# Patient Record
Sex: Male | Born: 1965 | Race: White | Hispanic: No | Marital: Single | Smoking: Former smoker
Health system: Southern US, Community
[De-identification: ages and names within clinical notes are randomized; demographics above are authoritative.]

## PROBLEM LIST (undated history)

## (undated) DIAGNOSIS — E118 Type 2 diabetes mellitus with unspecified complications: Secondary | ICD-10-CM

## (undated) DIAGNOSIS — E042 Nontoxic multinodular goiter: Secondary | ICD-10-CM

## (undated) DIAGNOSIS — Z720 Tobacco use: Secondary | ICD-10-CM

## (undated) DIAGNOSIS — I2510 Atherosclerotic heart disease of native coronary artery without angina pectoris: Secondary | ICD-10-CM

## (undated) DIAGNOSIS — S065X9A Traumatic subdural hemorrhage with loss of consciousness of unspecified duration, initial encounter: Secondary | ICD-10-CM

## (undated) DIAGNOSIS — B1910 Unspecified viral hepatitis B without hepatic coma: Secondary | ICD-10-CM

## (undated) DIAGNOSIS — G4730 Sleep apnea, unspecified: Secondary | ICD-10-CM

## (undated) DIAGNOSIS — S065XAA Traumatic subdural hemorrhage with loss of consciousness status unknown, initial encounter: Secondary | ICD-10-CM

## (undated) DIAGNOSIS — N182 Chronic kidney disease, stage 2 (mild): Secondary | ICD-10-CM

## (undated) DIAGNOSIS — B20 Human immunodeficiency virus [HIV] disease: Secondary | ICD-10-CM

## (undated) DIAGNOSIS — N200 Calculus of kidney: Secondary | ICD-10-CM

## (undated) DIAGNOSIS — I219 Acute myocardial infarction, unspecified: Secondary | ICD-10-CM

## (undated) DIAGNOSIS — K5190 Ulcerative colitis, unspecified, without complications: Secondary | ICD-10-CM

## (undated) DIAGNOSIS — I10 Essential (primary) hypertension: Secondary | ICD-10-CM

## (undated) DIAGNOSIS — E785 Hyperlipidemia, unspecified: Secondary | ICD-10-CM

## (undated) DIAGNOSIS — Z21 Asymptomatic human immunodeficiency virus [HIV] infection status: Secondary | ICD-10-CM

## (undated) DIAGNOSIS — C3490 Malignant neoplasm of unspecified part of unspecified bronchus or lung: Secondary | ICD-10-CM

## (undated) DIAGNOSIS — E119 Type 2 diabetes mellitus without complications: Secondary | ICD-10-CM

## (undated) HISTORY — DX: Malignant neoplasm of unspecified part of unspecified bronchus or lung (CMS HCC): C34.90

## (undated) HISTORY — DX: Type 2 diabetes mellitus without complications (CMS HCC): E11.9

## (undated) HISTORY — PX: HX CORONARY ARTERY BYPASS GRAFT: SHX141

## (undated) HISTORY — DX: Essential (primary) hypertension: I10

## (undated) HISTORY — PX: PORTACATH PLACEMENT: SHX2246

## (undated) HISTORY — PX: HX OTHER: 2100001105

## (undated) HISTORY — PX: OTHER SURGICAL HISTORY: SHX169

## (undated) HISTORY — DX: Ulcerative colitis, unspecified, without complications: K51.90

## (undated) HISTORY — PX: TONSILLECTOMY: SUR1361

## (undated) HISTORY — DX: Traumatic subdural hemorrhage with loss of consciousness of unspecified duration, initial encounter: S06.5X9A

## (undated) HISTORY — PX: EXPLORATORY LAPAROTOMY: SUR591

## (undated) HISTORY — PX: LEG SURGERY: SHX1003

## (undated) HISTORY — DX: Tobacco use: Z72.0

## (undated) HISTORY — DX: Nontoxic multinodular goiter: E04.2

## (undated) HISTORY — DX: Atherosclerotic heart disease of native coronary artery without angina pectoris: I25.10

## (undated) HISTORY — DX: Chronic kidney disease, stage 2 (mild): N18.2

## (undated) HISTORY — DX: Traumatic subdural hemorrhage with loss of consciousness status unknown, initial encounter: S06.5XAA

## (undated) HISTORY — DX: Hyperlipidemia, unspecified: E78.5

## (undated) HISTORY — DX: Type 2 diabetes mellitus with unspecified complications: E11.8

---

## 1998-07-04 ENCOUNTER — Emergency Department (HOSPITAL_COMMUNITY): Admission: EM | Admit: 1998-07-04 | Discharge: 1998-07-04 | Payer: Self-pay | Admitting: Emergency Medicine

## 1998-07-04 ENCOUNTER — Encounter: Payer: Self-pay | Admitting: Emergency Medicine

## 2000-04-29 ENCOUNTER — Encounter: Admission: RE | Admit: 2000-04-29 | Discharge: 2000-04-29 | Payer: Self-pay | Admitting: Internal Medicine

## 2000-04-29 ENCOUNTER — Ambulatory Visit (HOSPITAL_COMMUNITY): Admission: RE | Admit: 2000-04-29 | Discharge: 2000-04-29 | Payer: Self-pay | Admitting: Internal Medicine

## 2000-05-20 ENCOUNTER — Encounter: Admission: RE | Admit: 2000-05-20 | Discharge: 2000-05-20 | Payer: Self-pay | Admitting: Internal Medicine

## 2000-08-26 ENCOUNTER — Encounter: Admission: RE | Admit: 2000-08-26 | Discharge: 2000-08-26 | Payer: Self-pay | Admitting: Internal Medicine

## 2000-08-26 ENCOUNTER — Ambulatory Visit (HOSPITAL_COMMUNITY): Admission: RE | Admit: 2000-08-26 | Discharge: 2000-08-26 | Payer: Self-pay | Admitting: Internal Medicine

## 2000-09-09 ENCOUNTER — Encounter: Admission: RE | Admit: 2000-09-09 | Discharge: 2000-09-09 | Payer: Self-pay | Admitting: Internal Medicine

## 2000-11-06 ENCOUNTER — Encounter: Payer: Self-pay | Admitting: Internal Medicine

## 2000-11-06 ENCOUNTER — Encounter: Admission: RE | Admit: 2000-11-06 | Discharge: 2000-11-06 | Payer: Self-pay | Admitting: Internal Medicine

## 2000-11-07 ENCOUNTER — Encounter: Admission: RE | Admit: 2000-11-07 | Discharge: 2000-11-07 | Payer: Self-pay | Admitting: Internal Medicine

## 2000-11-07 ENCOUNTER — Encounter: Payer: Self-pay | Admitting: Internal Medicine

## 2000-12-09 ENCOUNTER — Encounter: Admission: RE | Admit: 2000-12-09 | Discharge: 2000-12-09 | Payer: Self-pay | Admitting: Internal Medicine

## 2000-12-09 ENCOUNTER — Ambulatory Visit (HOSPITAL_COMMUNITY): Admission: RE | Admit: 2000-12-09 | Discharge: 2000-12-09 | Payer: Self-pay | Admitting: Internal Medicine

## 2000-12-30 ENCOUNTER — Encounter: Admission: RE | Admit: 2000-12-30 | Discharge: 2000-12-30 | Payer: Self-pay | Admitting: Internal Medicine

## 2001-04-07 ENCOUNTER — Encounter: Admission: RE | Admit: 2001-04-07 | Discharge: 2001-04-07 | Payer: Self-pay | Admitting: Infectious Diseases

## 2001-04-07 ENCOUNTER — Encounter: Payer: Self-pay | Admitting: Infectious Diseases

## 2001-04-07 ENCOUNTER — Ambulatory Visit (HOSPITAL_COMMUNITY): Admission: RE | Admit: 2001-04-07 | Discharge: 2001-04-07 | Payer: Self-pay | Admitting: Infectious Diseases

## 2001-04-08 ENCOUNTER — Encounter: Admission: RE | Admit: 2001-04-08 | Discharge: 2001-04-08 | Payer: Self-pay | Admitting: Infectious Diseases

## 2001-04-27 ENCOUNTER — Ambulatory Visit (HOSPITAL_COMMUNITY): Admission: RE | Admit: 2001-04-27 | Discharge: 2001-04-27 | Payer: Self-pay | Admitting: Internal Medicine

## 2001-04-27 ENCOUNTER — Encounter: Admission: RE | Admit: 2001-04-27 | Discharge: 2001-04-27 | Payer: Self-pay | Admitting: Internal Medicine

## 2001-05-11 ENCOUNTER — Encounter: Admission: RE | Admit: 2001-05-11 | Discharge: 2001-05-11 | Payer: Self-pay | Admitting: Internal Medicine

## 2001-07-28 ENCOUNTER — Encounter: Admission: RE | Admit: 2001-07-28 | Discharge: 2001-07-28 | Payer: Self-pay

## 2001-07-28 ENCOUNTER — Ambulatory Visit (HOSPITAL_COMMUNITY): Admission: RE | Admit: 2001-07-28 | Discharge: 2001-07-28 | Payer: Self-pay | Admitting: Internal Medicine

## 2001-08-11 ENCOUNTER — Encounter: Admission: RE | Admit: 2001-08-11 | Discharge: 2001-08-11 | Payer: Self-pay | Admitting: Internal Medicine

## 2001-10-26 ENCOUNTER — Encounter: Admission: RE | Admit: 2001-10-26 | Discharge: 2001-10-26 | Payer: Self-pay | Admitting: Internal Medicine

## 2001-10-26 ENCOUNTER — Ambulatory Visit (HOSPITAL_COMMUNITY): Admission: RE | Admit: 2001-10-26 | Discharge: 2001-10-26 | Payer: Self-pay | Admitting: Internal Medicine

## 2001-11-16 ENCOUNTER — Encounter: Admission: RE | Admit: 2001-11-16 | Discharge: 2001-11-16 | Payer: Self-pay | Admitting: Internal Medicine

## 2002-02-11 ENCOUNTER — Encounter: Payer: Self-pay | Admitting: Infectious Diseases

## 2002-02-11 ENCOUNTER — Encounter: Admission: RE | Admit: 2002-02-11 | Discharge: 2002-02-11 | Payer: Self-pay | Admitting: Infectious Diseases

## 2002-02-11 ENCOUNTER — Ambulatory Visit (HOSPITAL_COMMUNITY): Admission: RE | Admit: 2002-02-11 | Discharge: 2002-02-11 | Payer: Self-pay | Admitting: Infectious Diseases

## 2002-02-23 ENCOUNTER — Ambulatory Visit (HOSPITAL_COMMUNITY): Admission: RE | Admit: 2002-02-23 | Discharge: 2002-02-23 | Payer: Self-pay | Admitting: Internal Medicine

## 2002-02-23 ENCOUNTER — Encounter: Admission: RE | Admit: 2002-02-23 | Discharge: 2002-02-23 | Payer: Self-pay | Admitting: Internal Medicine

## 2002-03-16 ENCOUNTER — Encounter: Admission: RE | Admit: 2002-03-16 | Discharge: 2002-03-16 | Payer: Self-pay | Admitting: Internal Medicine

## 2004-07-22 ENCOUNTER — Inpatient Hospital Stay (HOSPITAL_COMMUNITY): Admission: AD | Admit: 2004-07-22 | Discharge: 2004-07-24 | Payer: Self-pay | Admitting: Internal Medicine

## 2006-04-20 ENCOUNTER — Emergency Department (HOSPITAL_COMMUNITY): Admission: EM | Admit: 2006-04-20 | Discharge: 2006-04-20 | Payer: Self-pay | Admitting: Family Medicine

## 2006-04-21 ENCOUNTER — Emergency Department (HOSPITAL_COMMUNITY): Admission: EM | Admit: 2006-04-21 | Discharge: 2006-04-21 | Payer: Self-pay | Admitting: Family Medicine

## 2009-04-22 HISTORY — PX: CORONARY STENT PLACEMENT: SHX1402

## 2009-10-19 ENCOUNTER — Encounter: Admission: RE | Admit: 2009-10-19 | Discharge: 2009-10-19 | Payer: Self-pay | Admitting: Internal Medicine

## 2010-02-11 ENCOUNTER — Emergency Department (HOSPITAL_BASED_OUTPATIENT_CLINIC_OR_DEPARTMENT_OTHER): Admission: EM | Admit: 2010-02-11 | Discharge: 2010-02-11 | Payer: Self-pay | Admitting: Emergency Medicine
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
## 2010-09-07 NOTE — Discharge Summary (Signed)
[name], [name]                   ACCOUNT NO.:  [id number]   MEDICAL RECORD NO.:  [id number]          PATIENT TYPE:  INP   LOCATION:  3023                         FACILITY:  [facility]   PHYSICIAN:  Thora Lance, M.D.  DATE OF BIRTH:  04-03-1966   DATE OF ADMISSION:  07/22/2004  DATE OF DISCHARGE:  07/24/2004                                 DISCHARGE SUMMARY   REASON FOR ADMISSION:  This is a 45 year old white male who was admitted  with fever to 102, diarrhea.  The patient had had two weeks of diarrhea.  Initially he was febrile but then this had resolved.  In the two days prior  to admission he again had fever with abdominal pain and the continued  diarrhea.  He is HIV positive, followed at [facility] by Dr.  __________.   PHYSICAL EXAMINATION:  VITAL SIGNS:  Blood pressure 116/66, temperature  101.4, heart rate 78, respirations 18.  LUNGS:  Clear.  HEART:  Regular rate and rhythm with a 2/6 systolic murmur.  ABDOMEN:  Soft.  Bowel sounds present.  Tender bilateral lower abdomen.   HOSPITAL COURSE:  #1 - DIARRHEA:  The patient was admitted with fever and  diarrhea.  He had stool for culture, O&P, and C. difficile sent.  The CT of  the abdomen showed pan colitis.  C. difficile antigen was negative.  Blood  cultures, stool for O&P, and stool cultures were pending at the time of this  dictation.  The patient was empirically started on Flagyl and Cipro.  By the  second hospital day he had become afebrile and had decrease in his diarrhea.  He was sent home and will follow up with me in three days.  If his stool  studies do not reveal an etiology of his diarrhea and he continues to have  fever and diarrhea, we will arrange a colonoscopy.   #2 - SYSTOLIC MURMUR:  The patient did have a 2/6 systolic murmur noted on  examination.  Blood cultures remained negative.  A 2-D echocardiogram was  done and was pending at the time of this dictation.   DISCHARGE DIAGNOSES:  1.  Acute  colitis.  2.  Systolic murmur.  3.  Human immunodeficiency virus positive.   PROCEDURE:  CT scan of the abdomen and pelvis.   DISCHARGE MEDICATIONS:  1.  Cipro 500 mg p.o. b.i.d. six days.  2.  Flagyl 500 mg t.i.d. six days.   DIET:  As tolerated.   ACTIVITY:  No restrictions.   DISPOSITION:  To home.   FOLLOWUP:  Three days, Dr. Valentina Lucks.      JJG/MEDQ  D:  07/25/2004  T:  07/25/2004  Job:  [id number]

## 2011-02-17 ENCOUNTER — Emergency Department (HOSPITAL_BASED_OUTPATIENT_CLINIC_OR_DEPARTMENT_OTHER)
Admission: EM | Admit: 2011-02-17 | Discharge: 2011-02-17 | Disposition: A | Discharge status: Other Acute Inpatient Hospital | Payer: Managed Care, Other (non HMO) | Source: Home / Self Care | Attending: Emergency Medicine | Admitting: Emergency Medicine

## 2011-02-17 ENCOUNTER — Other Ambulatory Visit: Payer: Self-pay

## 2011-02-17 ENCOUNTER — Encounter: Payer: Self-pay | Admitting: *Deleted

## 2011-02-17 ENCOUNTER — Inpatient Hospital Stay (HOSPITAL_COMMUNITY)
Admission: AD | Admit: 2011-02-17 | Discharge: 2011-02-20 | DRG: 247 | Disposition: A | Discharge status: Home / Self Care | Payer: Managed Care, Other (non HMO) | Source: Other Acute Inpatient Hospital | Attending: Cardiology | Admitting: Cardiology

## 2011-02-17 ENCOUNTER — Emergency Department (INDEPENDENT_AMBULATORY_CARE_PROVIDER_SITE_OTHER): Payer: Managed Care, Other (non HMO)

## 2011-02-17 DIAGNOSIS — Z7982 Long term (current) use of aspirin: Secondary | ICD-10-CM

## 2011-02-17 DIAGNOSIS — I2510 Atherosclerotic heart disease of native coronary artery without angina pectoris: Secondary | ICD-10-CM | POA: Diagnosis present

## 2011-02-17 DIAGNOSIS — I10 Essential (primary) hypertension: Secondary | ICD-10-CM | POA: Insufficient documentation

## 2011-02-17 DIAGNOSIS — R059 Cough, unspecified: Secondary | ICD-10-CM

## 2011-02-17 DIAGNOSIS — R079 Chest pain, unspecified: Secondary | ICD-10-CM

## 2011-02-17 DIAGNOSIS — Z23 Encounter for immunization: Secondary | ICD-10-CM

## 2011-02-17 DIAGNOSIS — I214 Non-ST elevation (NSTEMI) myocardial infarction: Principal | ICD-10-CM | POA: Diagnosis present

## 2011-02-17 DIAGNOSIS — Z79899 Other long term (current) drug therapy: Secondary | ICD-10-CM

## 2011-02-17 DIAGNOSIS — Z21 Asymptomatic human immunodeficiency virus [HIV] infection status: Secondary | ICD-10-CM | POA: Insufficient documentation

## 2011-02-17 DIAGNOSIS — R05 Cough: Secondary | ICD-10-CM

## 2011-02-17 DIAGNOSIS — Z7902 Long term (current) use of antithrombotics/antiplatelets: Secondary | ICD-10-CM

## 2011-02-17 DIAGNOSIS — R0602 Shortness of breath: Secondary | ICD-10-CM | POA: Insufficient documentation

## 2011-02-17 DIAGNOSIS — F17200 Nicotine dependence, unspecified, uncomplicated: Secondary | ICD-10-CM | POA: Insufficient documentation

## 2011-02-17 DIAGNOSIS — Z8619 Personal history of other infectious and parasitic diseases: Secondary | ICD-10-CM

## 2011-02-17 HISTORY — DX: Essential (primary) hypertension: I10

## 2011-02-17 HISTORY — DX: Asymptomatic human immunodeficiency virus (hiv) infection status: Z21

## 2011-02-17 HISTORY — DX: Human immunodeficiency virus (HIV) disease: B20

## 2011-02-17 LAB — CBC
HCT: 45.1 % (ref 39.0–52.0)
HCT: 43.8 % (ref 39.0–52.0)
Hemoglobin: 15.5 g/dL (ref 13.0–17.0)
Hemoglobin: 15.1 g/dL (ref 13.0–17.0)
MCH: 28.4 pg (ref 26.0–34.0)
MCH: 28.9 pg (ref 26.0–34.0)
MCHC: 34.4 g/dL (ref 30.0–36.0)
MCV: 82.6 fL (ref 78.0–100.0)
MCV: 83.9 fL (ref 78.0–100.0)
Platelets: 200 10*3/uL (ref 150–400)
RBC: 5.46 MIL/uL (ref 4.22–5.81)
RDW: 13.8 % (ref 11.5–15.5)
WBC: 9.1 10*3/uL (ref 4.0–10.5)

## 2011-02-17 LAB — CARDIAC PANEL(CRET KIN+CKTOT+MB+TROPI)
CK, MB: 15.6 ng/mL (ref 0.3–4.0)
CK, MB: 41.7 ng/mL (ref 0.3–4.0)
Relative Index: 8 — ABNORMAL HIGH (ref 0.0–2.5)
Relative Index: 6.1 — ABNORMAL HIGH (ref 0.0–2.5)
Troponin I: 9.16 ng/mL (ref <0.30)

## 2011-02-17 LAB — DIFFERENTIAL
Basophils Absolute: 0.1 10*3/uL (ref 0.0–0.1)
Basophils Relative: 1 % (ref 0–1)
Eosinophils Relative: 0 % (ref 0–5)
Monocytes Relative: 7 % (ref 3–12)

## 2011-02-17 LAB — BASIC METABOLIC PANEL
BUN: 11 mg/dL (ref 6–23)
Chloride: 97 mEq/L (ref 96–112)
Creatinine, Ser: 0.7 mg/dL (ref 0.50–1.35)
GFR calc Af Amer: >90 mL/min (ref >90)
GFR calc non Af Amer: >90 mL/min (ref >90)
Glucose, Bld: 219 mg/dL — ABNORMAL HIGH (ref 70–99)

## 2011-02-17 LAB — APTT: aPTT: 37 seconds (ref 24–37)

## 2011-02-17 MED ORDER — ONDANSETRON HCL 4 MG/2ML IJ SOLN
INTRAMUSCULAR | Status: AC
  Administered 2011-02-17: 4 mg via INTRAVENOUS
  Filled 2011-02-17: qty 2

## 2011-02-17 MED ORDER — NITROGLYCERIN 2 % TD OINT
0.5000 [in_us] | TOPICAL_OINTMENT | Freq: Once | TRANSDERMAL | Status: AC
  Administered 2011-02-17: 66.6667 [in_us] via TOPICAL
  Filled 2011-02-17: qty 1

## 2011-02-17 MED ORDER — MORPHINE SULFATE 4 MG/ML IJ SOLN
INTRAMUSCULAR | Status: AC
  Administered 2011-02-17: 4 mg via INTRAVENOUS
  Filled 2011-02-17: qty 1

## 2011-02-17 MED ORDER — ASPIRIN 81 MG PO TABS
324.0000 mg | ORAL_TABLET | Freq: Once | ORAL | Status: AC
  Administered 2011-02-17: 324 mg via ORAL

## 2011-02-17 MED ORDER — HEPARIN (PORCINE) IN NACL 100-0.45 UNIT/ML-% IJ SOLN
1400.0000 [IU]/h | Freq: Once | INTRAMUSCULAR | Status: AC
  Administered 2011-02-17: 1400 [IU]/h via INTRAVENOUS

## 2011-02-17 MED ORDER — ONDANSETRON HCL 4 MG/2ML IJ SOLN
4.0000 mg | Freq: Once | INTRAMUSCULAR | Status: AC
  Administered 2011-02-17: 4 mg via INTRAVENOUS

## 2011-02-17 MED ORDER — METOPROLOL TARTRATE 1 MG/ML IV SOLN
5.0000 mg | Freq: Once | INTRAVENOUS | Status: AC
  Administered 2011-02-17: 5 mg via INTRAVENOUS

## 2011-02-17 MED ORDER — HEPARIN (PORCINE) IN NACL 100-0.45 UNIT/ML-% IJ SOLN
INTRAMUSCULAR | Status: AC
  Filled 2011-02-17: qty 250

## 2011-02-17 MED ORDER — ALBUTEROL SULFATE (5 MG/ML) 0.5% IN NEBU: 2.5000 mg | INHALATION_SOLUTION | Freq: Once | RESPIRATORY_TRACT | Status: DC

## 2011-02-17 MED ORDER — IPRATROPIUM BROMIDE 0.02 % IN SOLN: 0.5000 mg | Freq: Once | RESPIRATORY_TRACT | Status: DC

## 2011-02-17 MED ORDER — METOPROLOL TARTRATE 1 MG/ML IV SOLN
INTRAVENOUS | Status: AC
  Administered 2011-02-17: 5 mg via INTRAVENOUS
  Filled 2011-02-17: qty 5

## 2011-02-17 MED ORDER — MORPHINE SULFATE 4 MG/ML IJ SOLN
4.0000 mg | Freq: Once | INTRAMUSCULAR | Status: AC
  Administered 2011-02-17: 4 mg via INTRAVENOUS

## 2011-02-17 MED ORDER — HEPARIN (PORCINE) IN NACL 100-0.45 UNIT/ML-% IJ SOLN: 18.0000 [IU]/kg/h | Freq: Once | INTRAMUSCULAR | Status: DC

## 2011-02-17 MED ORDER — ASPIRIN 81 MG PO CHEW
CHEWABLE_TABLET | ORAL | Status: AC
  Administered 2011-02-17: 17:00:00
  Filled 2011-02-17: qty 4

## 2011-02-17 MED ORDER — ASPIRIN 325 MG PO TABS: 325.0000 mg | ORAL_TABLET | Freq: Once | ORAL | Status: DC

## 2011-02-17 MED ORDER — HEPARIN BOLUS VIA INFUSION
4000.0000 [IU] | Freq: Once | INTRAVENOUS | Status: AC
  Administered 2011-02-17: 4000 [IU] via INTRAVENOUS
  Filled 2011-02-17: qty 4000

## 2011-02-17 NOTE — ED Provider Notes (Signed)
History    Scribed for Dylan Chick, MD, the patient was seen in room MH01/MH01. This chart was scribed by Katha Cabal.   CSN: [id number] Arrival date & time: 02/17/2011  2:37 PM   First MD Initiated Contact with Patient 02/17/11 1521      Chief Complaint  Patient presents with  . Chest Pain    (Consider location/radiation/quality/duration/timing/severity/associated sxs/prior treatment) HPI [name] is a 45 y.o. male who presents to the Emergency Department complaining of intermittent mid sternal chest pain that radiates to up neck and down bilateral UE.  SOB for past few days.  Pain is described as dull and pressured.  Patient reorts sudden onset of chest pain yesterday while he sitting on the couch.  Chest pain is not worsened by exertion.  Patient reports chest pain is worsened by coughing.  Patient was evaluated by Dr. Valentina Lucks for ear infection.  Patient just finished taking prednisone without relief.  Patient has persistent cough and bilateral ear pain but denies current nasal congestion.  Patient is a smoker with hx of HTN.  Patient does not have hx of DM, heart problems and hypercholesterolemia.  Patient with hx of HIV and his last viral load was undectable in July.   PCP Dr. Kirby Funk     Past Medical History  Diagnosis Date  . Hypertension   . HIV (human immunodeficiency virus infection)     Past Surgical History  Procedure Date  . Tonsillectomy   . Leg surgery   . Arm surgery     No family history on file.  History  Substance Use Topics  . Smoking status: Current Everyday Smoker  . Smokeless tobacco: Not on file  . Alcohol Use: Yes      Review of Systems 10 Systems reviewed and are negative for acute change except as noted in the HPI.  Allergies  Review of patient's allergies indicates no known allergies.  Home Medications   Current Outpatient Rx  Name Route Sig Dispense Refill  . DARUNAVIR ETHANOLATE 400 MG PO TABS Oral Take 400 mg by  mouth daily with breakfast.      . EMTRICITABINE-TENOFOVIR 200-300 MG PO TABS Oral Take 1 tablet by mouth daily.      Marland Kitchen HYDROCHLOROTHIAZIDE 25 MG PO TABS Oral Take 25 mg by mouth daily.      . IBUPROFEN 200 MG PO CAPS Oral Take 3 capsules by mouth every 6 (six) hours as needed. For migraine     . RITONAVIR 100 MG PO CAPS Oral Take 100 mg by mouth daily.        BP 172/116  Pulse 97  Temp(Src) 97.6 F (36.4 C) (Oral)  Resp 17  SpO2 100%  Physical Exam  Constitutional: He is oriented to person, place, and time. He appears well-developed and well-nourished.  HENT:  Head: Normocephalic and atraumatic.  Right Ear: Tympanic membrane is not erythematous.  Left Ear: Tympanic membrane is not erythematous.       fluid behind TMs bilaterally  Eyes: EOM are normal. Pupils are equal, round, and reactive to light.  Cardiovascular: Normal rate, regular rhythm and normal heart sounds.   Pulmonary/Chest: Effort normal. No respiratory distress. He has no wheezes. He exhibits tenderness (low mid sternal area ).  Abdominal: Soft. There is no tenderness. There is no rebound.  Musculoskeletal: Normal range of motion. He exhibits no edema.  Neurological: He is alert and oriented to person, place, and time.  Skin: Skin is warm, dry and  intact.  Psychiatric: He has a normal mood and affect. His behavior is normal.    ED Course  CRITICAL CARE Performed by: [name] Authorized by: [name] Total critical care time: 40 minutes Critical care time was exclusive of separately billable procedures and treating other patients. Critical care was necessary to treat or prevent imminent or life-threatening deterioration of the following conditions: cardiac failure. Critical care was time spent personally by me on the following activities: development of treatment plan with patient or surrogate, discussions with consultants, evaluation of patient's response to treatment, examination of patient,  obtaining history from patient or surrogate, ordering and performing treatments and interventions, ordering and review of laboratory studies, ordering and review of radiographic studies, pulse oximetry and re-evaluation of patient's condition.   (including critical care time)   DIAGNOSTIC STUDIES: Oxygen Saturation is 100% on nasal cannula,  normal by my interpretation.  EKG:   Date: 02/17/2011  Rate: 98  Rhythm: normal sinus rhythm  QRS Axis: normal  Intervals: normal  ST/T Wave abnormalities: nonspecific T wave changes diffuse t wave flattening and isolated twi in lead III  Conduction Disutrbances:none  Narrative Interpretation:   Old EKG Reviewed: none available   COORDINATION OF CARE:  3:56 PM  Physical exam complete.  Will order labs and CXR.   5:06 PM  Spoke with cardiologist regarding patients elevated troponin.        LABS / RADIOLOGY:  Labs Reviewed  BASIC METABOLIC PANEL - Abnormal; Notable for the following:    Sodium 133 (*)    Glucose, Bld 219 (*)    All other components within normal limits  CARDIAC PANEL(CRET KIN+CKTOT+MB+TROPI) - Abnormal; Notable for the following:    CK, MB 15.6 (*)    Troponin I 1.24 (*)    Relative Index 8.0 (*)    All other components within normal limits  CBC   No results found.  Results for orders placed during the hospital encounter of 02/17/11  CBC      Component Value Range   WBC 9.1  4.0 - 10.5 (K/uL)   RBC 5.46  4.22 - 5.81 (MIL/uL)   Hemoglobin 15.5  13.0 - 17.0 (g/dL)   HCT 60.4  54.0 - 98.1 (%)   MCV 82.6  78.0 - 100.0 (fL)   MCH 28.4  26.0 - 34.0 (pg)   MCHC 34.4  30.0 - 36.0 (g/dL)   RDW 19.1  47.8 - 29.5 (%)   Platelets 200  150 - 400 (K/uL)  BASIC METABOLIC PANEL      Component Value Range   Sodium 133 (*) 135 - 145 (mEq/L)   Potassium 3.9  3.5 - 5.1 (mEq/L)   Chloride 97  96 - 112 (mEq/L)   CO2 25  19 - 32 (mEq/L)   Glucose, Bld 219 (*) 70 - 99 (mg/dL)   BUN 11  6 - 23 (mg/dL)   Creatinine, Ser 6.21   0.50 - 1.35 (mg/dL)   Calcium 9.6  8.4 - 30.8 (mg/dL)   GFR calc non Af Amer >90  >90 (mL/min)   GFR calc Af Amer >90  >90 (mL/min)  CARDIAC PANEL(CRET KIN+CKTOT+MB+TROPI)      Component Value Range   Total CK 196  7 - 232 (U/L)   CK, MB 15.6 (*) 0.3 - 4.0 (ng/mL)   Troponin I 1.24 (*) <0.30 (ng/mL)   Relative Index 8.0 (*) 0.0 - 2.5         MDM   MDM:  pt presenting with chest tightness with radiation into bilateral arms- pain began last night while seated.  Not made worse with exertion.  Described as tightness in the center of his chest.  Pt has had recent viral illness- nasal congestion and cough, chest discomfort worsened with coughing.  EKG with nonspecific t wave flattening, no st segment changes.  Labs obtained and revealed elevated troponin and CK-MB.  D/w Dr. Donnie Aho, pt to be transferred to step down bed at [facility].  Heparin, nitroglycerin started.  Aspirin given.  Pt aware of results and plan for transfer.     MEDICATIONS GIVEN IN THE E.D. Scheduled Meds:    . aspirin      . aspirin  324 mg Oral Once  . heparin  4,000 Units Intravenous Once  . heparin  1,400 Units/hr Intravenous Once  . nitroGLYCERIN  0.5 inch Topical Once  . DISCONTD: albuterol  2.5 mg Nebulization Once  . DISCONTD: aspirin  325 mg Oral Once  . DISCONTD: heparin  18 Units/kg/hr Intravenous Once  . DISCONTD: ipratropium  0.5 mg Nebulization Once   Continuous Infusions:      IMPRESSION: No diagnosis found.     Medical screening examination/treatment/procedure(s) were performed by non-physician practitioner and as supervising physician I was immediately available for consultation/collaboration.           Dylan Chick, MD 02/18/11 [phone]

## 2011-02-17 NOTE — ED Notes (Signed)
Patient states that last night he started experiencing pain in both arms and some mid chest pain that went away, this morning started experiencing the same again with pain shooting up his neck, no CP at this time, but still experiencing bilateral arm pain, has been coughing over the past few and just finished steroids for ear canal inflammation and sinus congestion

## 2011-02-17 NOTE — ED Notes (Signed)
Went to introduce myself to pt.  Pt states he is having some chest tightness and arm pain.  Pt relates he has had his blood drawn but no medications as of yet.  Spoke to Dr. Karma Ganja and informed of tightness.

## 2011-02-18 DIAGNOSIS — I2510 Atherosclerotic heart disease of native coronary artery without angina pectoris: Secondary | ICD-10-CM

## 2011-02-18 LAB — COMPREHENSIVE METABOLIC PANEL
ALT: 57 U/L — ABNORMAL HIGH (ref 0–53)
Alkaline Phosphatase: 100 U/L (ref 39–117)
BUN: 13 mg/dL (ref 6–23)
CO2: 24 mEq/L (ref 19–32)
Chloride: 99 mEq/L (ref 96–112)
GFR calc Af Amer: >90 mL/min (ref >90)
GFR calc non Af Amer: >90 mL/min (ref >90)
Glucose, Bld: 121 mg/dL — ABNORMAL HIGH (ref 70–99)
Potassium: 4 mEq/L (ref 3.5–5.1)
Total Bilirubin: 0.3 mg/dL (ref 0.3–1.2)
Total Protein: 6.9 g/dL (ref 6.0–8.3)

## 2011-02-18 LAB — HEMOGLOBIN A1C
Hgb A1c MFr Bld: 6.5 % — ABNORMAL HIGH (ref <5.7)
Mean Plasma Glucose: 140 mg/dL — ABNORMAL HIGH (ref <117)

## 2011-02-18 LAB — MAGNESIUM: Magnesium: 1.8 mg/dL (ref 1.5–2.5)

## 2011-02-18 LAB — CBC
MCH: 28.2 pg (ref 26.0–34.0)
Platelets: 190 10*3/uL (ref 150–400)
RBC: 4.97 MIL/uL (ref 4.22–5.81)
RDW: 14 % (ref 11.5–15.5)

## 2011-02-18 LAB — LIPID PANEL
Cholesterol: 164 mg/dL (ref 0–200)
Total CHOL/HDL Ratio: 6.3 RATIO

## 2011-02-18 LAB — POCT ACTIVATED CLOTTING TIME: Activated Clotting Time: 457 seconds
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
## 2011-02-19 LAB — CBC
HCT: 39 % (ref 39.0–52.0)
Hemoglobin: 13.2 g/dL (ref 13.0–17.0)
MCV: 85.5 fL (ref 78.0–100.0)
Platelets: 167 10*3/uL (ref 150–400)
RBC: 4.56 MIL/uL (ref 4.22–5.81)
WBC: 9.5 10*3/uL (ref 4.0–10.5)

## 2011-02-19 LAB — BASIC METABOLIC PANEL
BUN: 11 mg/dL (ref 6–23)
CO2: 23 mEq/L (ref 19–32)
Chloride: 102 mEq/L (ref 96–112)
Creatinine, Ser: 0.83 mg/dL (ref 0.50–1.35)

## 2011-02-19 LAB — PLATELET INHIBITION P2Y12: Platelet Function  P2Y12: 269 [PRU] (ref 194–418)

## 2011-02-20 DIAGNOSIS — I214 Non-ST elevation (NSTEMI) myocardial infarction: Secondary | ICD-10-CM

## 2011-02-20 LAB — PLATELET INHIBITION P2Y12: Platelet Function  P2Y12: 36 [PRU] — ABNORMAL LOW (ref 194–418)

## 2011-02-23 NOTE — Cardiovascular Report (Signed)
NAMELUCIFER, [name]                   ACCOUNT NO.:  [id number]  MEDICAL RECORD NO.:  [id number]  LOCATION:                                 FACILITY:  PHYSICIAN:  Arturo Morton. Riley Kill, MD, FACCDATE OF BIRTH:  December 07, 1965  DATE OF PROCEDURE:  02/18/2011 DATE OF DISCHARGE:                           CARDIAC CATHETERIZATION   INDICATIONS:  [name] is a gentleman with multiple medical problems as outlined.  He is on anti-retroviral drugs.  He smokes about a half a pack of cigarettes a day.  He presented with positive cardiac enzymes. He was brought to the cath lab for further evaluation.  The patient was desirous of the radial approach.  PROCEDURES: 1. Left heart catheterization. 2. Selective coronary arteriography. 3. Selective left ventriculography. 4. Percutaneous stenting of the circumflex using a drug-eluting stent.  DESCRIPTION OF PROCEDURE:  The patient was brought to the cath lab, prepped and draped in the usual fashion.  After informed consent, the Allen's test was normal.  Through an anterior puncture, the right radial artery was entered.  A 5-French sheath was placed.  We were unable to pass a J-wire across the shoulder.  A right coronary catheter was placed up near the shoulder, and we were ultimately able to get down using a Versacore wire.  Heparin was given according to protocol.  Intra- arterial verapamil 3 mg had also been given as well as some nitroglycerin.  Views of the right coronary artery and left coronary artery were then obtained.  Central aortic and left ventricular pressures were measured with a pigtail.  Ventriculography was performed in the RAO projection.  We reviewed the films carefully and I discussed the case with the patient.  We elected to proceed with percutaneous coronary stenting of the circumflex artery.  The 5-French sheath was exchanged for a 6-French sheath.  We again had to use a right coronary catheter and had little bit of difficulty getting  around the right shoulder.  We were able then to successfully engage the central aorta. A 3-0 guiding catheter was then utilized to 6-French.  Bivalirudin had been given according to protocol and clopidogrel 600 mg as a bolus had been given as well.  Following this, we used a stabilization wire in the ramus vessel using a Prowater, and then took a traverse wire down into the circumflex.  Predilatation was done with a 2.25-mm, 12-[employer] noncompliant balloon.  This was followed by a PROMUS element drug-eluting stent 20 x 2.5.  This was carefully placed across the lesion and then dilated approximately 12-13 atmospheres.  We then post dilated using a 2.75 noncompliant balloon throughout the course of the stent.  The edges were difficult to see because of the patient's size. We did move the camera quite a bit, to try to minimize radiation dose. Final angiographic views were excellent.  Intracoronary nitroglycerin was administered.  All catheters were then subsequently removed.  A TR band was placed and he was taken to the holding area in satisfactory clinical condition.  HEMODYNAMIC DATA: 1. The central aortic pressure of 107/74, mean 89. 2. LV pressure 106/12 with an LVEDP of 21. 3. There was no gradient  or pullback across the aortic valve.  ANGIOGRAPHIC DATA: 1. The left main coronary artery is free of critical disease. 2. The LAD courses to the apex.  The LAD has multiple areas of plaque     including a 40 and then 30 and then 40% lesion distally.  There is     a second diagonal branch which bifurcates and has a fair amount of     diffuse plaque, but is very small in caliber.  It is up to 80%     narrow in its midportion. 3. The circumflex has a tiny first marginal that is severely diseased,     but it is very small in caliber.  The AV portion at this point has     some mild plaque and then a 95% subtotal occlusion leading into a     large bifurcating marginal.  Following  placement of the drug-     eluting stent, the reduction in stenosis was from 95-0%.  There is     TIMI-2 flow at the beginning of the procedure and TIMI-3 at the     completion of the procedure.  There was no evidence of significant     edge tear.  Intracoronary nitroglycerin did bring out the fair     amount of diffuse plaque within the vessel itself. 4. The right coronary artery has an eccentric shelf of approximately     60% and then a 70-80% stenosis distally with some mild luminal     irregularity. 5. Ventriculography in the RAO projection reveals vigorous global     systolic function with an EF in excess of 55%.  CONCLUSIONS: 1. Well-preserved left ventricular function without a segmental wall     motion abnormality. 2. Non-ST elevation myocardial infarction with subtotal occlusion of     the circumflex coronary artery with TIMI-2 flow with faint distal     collaterals. 3. Diffuse disease of the LAD and moderately severe disease of the     right coronary artery. 4. Successful percutaneous stenting with a drug-eluting platform in     the circumflex artery.  DISPOSITION:  The patient will need aspirin and Plavix for minimum of 12months.  We will do ADP testing.  We will make a decision with regard to the right coronary artery after review of his films with Dr. Antoine Poche.     Arturo Morton. Riley Kill, MD, [facility]     TDS/MEDQ  D:  02/18/2011  T:  02/18/2011  Job:  [id number]  Electronically Signed by Shawnie Pons MD [facility] on 02/23/2011 09:14:37 AM

## 2011-02-27 ENCOUNTER — Encounter: Payer: Managed Care, Other (non HMO) | Admitting: Physician Assistant

## 2011-03-01 ENCOUNTER — Encounter: Payer: Self-pay | Admitting: *Deleted

## 2011-03-04 ENCOUNTER — Encounter: Payer: Self-pay | Admitting: *Deleted

## 2011-03-04 ENCOUNTER — Encounter: Payer: Self-pay | Admitting: Physician Assistant

## 2011-03-04 ENCOUNTER — Ambulatory Visit (INDEPENDENT_AMBULATORY_CARE_PROVIDER_SITE_OTHER): Payer: Managed Care, Other (non HMO) | Admitting: Physician Assistant

## 2011-03-04 VITALS — BP 134/72 | HR 96 | Ht 68.0 in | Wt 273.0 lb

## 2011-03-04 DIAGNOSIS — E785 Hyperlipidemia, unspecified: Secondary | ICD-10-CM

## 2011-03-04 DIAGNOSIS — Z9861 Coronary angioplasty status: Secondary | ICD-10-CM | POA: Insufficient documentation

## 2011-03-04 DIAGNOSIS — Z21 Asymptomatic human immunodeficiency virus [HIV] infection status: Secondary | ICD-10-CM

## 2011-03-04 DIAGNOSIS — I10 Essential (primary) hypertension: Secondary | ICD-10-CM

## 2011-03-04 DIAGNOSIS — B20 Human immunodeficiency virus [HIV] disease: Secondary | ICD-10-CM

## 2011-03-04 DIAGNOSIS — R7302 Impaired glucose tolerance (oral): Secondary | ICD-10-CM | POA: Insufficient documentation

## 2011-03-04 DIAGNOSIS — I2510 Atherosclerotic heart disease of native coronary artery without angina pectoris: Secondary | ICD-10-CM

## 2011-03-04 MED ORDER — METOPROLOL TARTRATE 25 MG PO TABS: 50.0000 mg | ORAL_TABLET | Freq: Two times a day (BID) | ORAL | Status: DC

## 2011-03-04 NOTE — Assessment & Plan Note (Signed)
Controlled.  However, his HR needs to be lower.  I have increased his metoprolol to 50 mg BID.

## 2011-03-04 NOTE — Assessment & Plan Note (Signed)
Doing well post PCI for NSTEMI.  Residual CAD reviewed with Dr.  Shawnie Pons.  He reviewed the patient's films.  Recommendation is to proceed with ETT-myoview to assess for inferior ischemia.  If positive, PCI of RCA should be considered.  If negative, medical Tx can be continued.  I discussed this all with the patient.  He may return to work in one week.  He will follow up with Dr. Riley Kill in 2 weeks.  Continue ASA and Plavix.

## 2011-03-04 NOTE — Assessment & Plan Note (Signed)
Management per ID. 

## 2011-03-04 NOTE — Assessment & Plan Note (Signed)
LFTs were high in the hospital.  Repeat LFTs today.  Schedule follow up FLP and LFTs in 6 weeks.  Goal LDL < 70.

## 2011-03-04 NOTE — Assessment & Plan Note (Signed)
Discussed the importance of follow up on this with the patient.  He will discuss further with his PCP.

## 2011-03-04 NOTE — Patient Instructions (Addendum)
Your physician recommends that you have lab work today; Hepatic function panel; 272.4  Your physician has recommended you make the following change in your medication: Increase Metoprolol to 50 mg twice daily.  This will be two tablets of 25 mg two times daily. This prescription has been sent to your pharmacy.    Your physician has requested that you have en exercise stress myoview DX; 414.01 CAD.  This can be done within the next 2 weeks.   For further information please visit [email or web address] Please follow instruction sheet, as given.  Your physician recommends that you return for lab work in: 6 weeks: Fasting lipid panel, Hepatic function panel 272.4 hyperlipidemia.  Your physician states you may return to work 03/11/2011.  This letter has been provided for you.  Please schedule a follow up appointment with Dr. Riley Kill in 2 weeks.

## 2011-03-04 NOTE — Progress Notes (Signed)
History of Present Illness: PCP:  Dr. Kirby Funk Primary Cardiologist:  Dr.  Shawnie Pons   ID:  Dr. Alvis Lemmings at [facility] [name] is Dylan 45 y.o. male with Dylan h/o HIV, HTN who was admitted 10/28-10/31.  He presented with chest pain.  He ruled in for NSTEMI and had LHC 02/18/11:  LAD 40%, 30%, dLAD 40%, mDx 80% (very small vessel), CFX 95%, tiny OM 90% (too small for PCI), RCA 60%, dRCA 70-80%, EF 55%.  He was tx with Dylan Promus DES to the CFX.  PRU was 36 on Plavix and this was continued.  Residual CAD was treated medically.  Labs: Hgb 13.2, Na 134, K 3.7, creatinine 0.83, AST 93, ALT 57, A1C 6.5, TC 164, TG 280, HDL 26, LDL 82, TSH 0.868, CXR was unremarkable.    Since d/c from the hospital, he has done well.  Had chest pain on 2 occasions.  They were brief and unlike his angina.  Has been walking 20-25 mins without chest pain or dyspnea.  No associated symptoms.  No orthopnea, PND or edema.  No palpitations.  No syncope or near syncope.  We discussed cardiac rehab.  He is not interested due to his work schedule.    Past Medical History  Diagnosis Date  . Hypertension   . HIV (human immunodeficiency virus infection)     on therapy  . Coronary artery disease     s/p NSTEMI 01/2011: LHC 02/18/11:  LAD 40%, 30%, dLAD 40%, mDx 80% (very small vessel), CFX 95%, tiny OM 90% (too small for PCI), RCA 60%, dRCA 70-80%, EF 55%.  He was tx with Dylan Promus DES to the CFX.  . Tobacco abuse   . History of hepatitis B   . History of ulcerative colitis   . Asthma   . HLD (hyperlipidemia)   . Glucose intolerance (impaired glucose tolerance)     A1C 6.5 01/2011    Current Outpatient Prescriptions  Medication Sig Dispense Refill  . albuterol (PROVENTIL HFA;VENTOLIN HFA) 108 (90 BASE) MCG/ACT inhaler Inhale 2 puffs into the lungs every 6 (six) hours as needed.        Marland Kitchen aspirin EC 81 MG tablet Take 81 mg by mouth daily.        . clopidogrel (PLAVIX) 75 MG tablet Take 75 mg by mouth daily.        . darunavir  (PREZISTA) 400 MG tablet Take 800 mg by mouth daily with breakfast.       . emtricitabine-tenofovir (TRUVADA) 200-300 MG per tablet Take 1 tablet by mouth daily.        . fluticasone (FLONASE) 50 MCG/ACT nasal spray Place 2 sprays into the nose as needed.        Marland Kitchen ibuprofen (ADVIL,MOTRIN) 200 MG tablet Take 200 mg by mouth every 6 (six) hours as needed.        . metoprolol tartrate (LOPRESSOR) 25 MG tablet Take 2 tablets (50 mg total) by mouth 2 (two) times daily.  120 tablet  1  . NITROSTAT 0.4 MG SL tablet       . pravastatin (PRAVACHOL) 40 MG tablet Take 40 mg by mouth at bedtime.        . ritonavir (NORVIR) 100 MG capsule Take 100 mg by mouth daily.          Allergies: No Known Allergies  History  Substance Use Topics  . Smoking status: Current Everyday Smoker  . Smokeless tobacco: Not on file  .  Alcohol Use: Yes     ROS:  Please see the history of present illness.   All other systems reviewed and negative.   Vital Signs: BP 134/72  Pulse 96  Ht 5\' 8"  (1.727 m)  Wt 273 lb (123.832 kg)  BMI 41.51 kg/m2  PHYSICAL EXAM: Well nourished, well developed, in no acute distress HEENT: normal Neck: no JVD Cardiac:  normal S1, S2; RRR; no murmur Lungs:  clear to auscultation bilaterally, no wheezing, rhonchi or rales Abd: soft, nontender, no hepatomegaly Ext: no edema; right radial site ok without hematoma or bruit Skin: warm and dry Neuro:  CNs 2-12 intact, no focal abnormalities noted  EKG:  NSR, HR 96, incomplete RBBB, NSSTTW changes  ASSESSMENT AND PLAN:

## 2011-03-06 ENCOUNTER — Telehealth: Payer: Self-pay | Admitting: Cardiology

## 2011-03-06 LAB — HEPATIC FUNCTION PANEL
ALT: 42 U/L (ref 0–53)
AST: 30 U/L (ref 0–37)
Alkaline Phosphatase: 99 U/L (ref 39–117)
Bilirubin, Direct: 0.1 mg/dL (ref 0.0–0.3)
Total Protein: 7.9 g/dL (ref 6.0–8.3)

## 2011-03-06 NOTE — Telephone Encounter (Signed)
Since the patient just saw Lorin Picket, I checked with Danielle Rankin to see if she had this form on the patient. She does have this. She will check with Lorin Picket to see if he can complete this today.

## 2011-03-06 NOTE — Telephone Encounter (Signed)
Claim #[id number], checking on status of fax they sent requesting attending phys statement and most recent office notes, they need it today or his claim will be denied

## 2011-03-06 NOTE — Telephone Encounter (Signed)
I do not see in any of [name]'s paperwork where this form is pending. I have called [employer] and asked that they fax another form. I made them aware that Dr. Riley Kill is out all week and that his nurse is not here today, so I am not certain if the initial form was ever received. I will forward this message to [name] for review.

## 2011-03-06 NOTE — Telephone Encounter (Signed)
Scott signed forms. Forms with records faxed to [facility]. I will forward to Okey Regal to see if she can notify patient tomorrow.

## 2011-03-07 NOTE — Telephone Encounter (Signed)
pt aware forms are done and faxed and will come by to pick up forms today. Danielle Rankin

## 2011-03-20 ENCOUNTER — Ambulatory Visit (HOSPITAL_COMMUNITY): Payer: Managed Care, Other (non HMO) | Attending: Internal Medicine | Admitting: Radiology

## 2011-03-20 VITALS — Ht 68.0 in | Wt 274.0 lb

## 2011-03-20 DIAGNOSIS — I252 Old myocardial infarction: Secondary | ICD-10-CM | POA: Insufficient documentation

## 2011-03-20 DIAGNOSIS — E785 Hyperlipidemia, unspecified: Secondary | ICD-10-CM | POA: Insufficient documentation

## 2011-03-20 DIAGNOSIS — I2510 Atherosclerotic heart disease of native coronary artery without angina pectoris: Secondary | ICD-10-CM | POA: Insufficient documentation

## 2011-03-20 DIAGNOSIS — R079 Chest pain, unspecified: Secondary | ICD-10-CM

## 2011-03-20 DIAGNOSIS — R0989 Other specified symptoms and signs involving the circulatory and respiratory systems: Secondary | ICD-10-CM | POA: Insufficient documentation

## 2011-03-20 DIAGNOSIS — R0602 Shortness of breath: Secondary | ICD-10-CM | POA: Insufficient documentation

## 2011-03-20 DIAGNOSIS — R0609 Other forms of dyspnea: Secondary | ICD-10-CM | POA: Insufficient documentation

## 2011-03-20 DIAGNOSIS — F17200 Nicotine dependence, unspecified, uncomplicated: Secondary | ICD-10-CM | POA: Insufficient documentation

## 2011-03-20 DIAGNOSIS — I10 Essential (primary) hypertension: Secondary | ICD-10-CM | POA: Insufficient documentation

## 2011-03-20 MED ORDER — TECHNETIUM TC 99M TETROFOSMIN IV KIT
11.0000 | PACK | Freq: Once | INTRAVENOUS | Status: AC | PRN
  Administered 2011-03-20: 11 via INTRAVENOUS

## 2011-03-20 MED ORDER — TECHNETIUM TC 99M TETROFOSMIN IV KIT
33.0000 | PACK | Freq: Once | INTRAVENOUS | Status: AC | PRN
  Administered 2011-03-20: 33 via INTRAVENOUS

## 2011-03-20 NOTE — Progress Notes (Signed)
[facility] SITE 3 NUCLEAR MED [address] [place] [address] [phone]  Cardiology Nuclear Med Study  [name] is a 45 y.o. male [id number] 1965/07/13   Nuclear Med Background Indication for Stress Test:  Evaluation for Ischemia, Stent Patency, Assess for inferior ischemia (recent Cath RCA 60%, 70-8% stenosis) History: 02/18/11  Heart Catheterization: EF 55% CFX 95% -stent 90% OM too small for PCI Residual CAD 3V Dz, 02/17/11 Myocardial Infarction: NSTEMI and 02/18/11 Stents: CFX  Cardiac Risk Factors: Hypertension, Lipids and Smoker  Symptoms:  Chest Pain, DOE and SOB   Nuclear Pre-Procedure Caffeine/Decaff Intake:  None NPO After: 10:00pm   Lungs: clear IV 0.9% NS with Angio Cath:  20g  IV Site: R Antecubital x 1, tolerated well IV Started by:  Irean Hong, RN  Chest Size (in):  46 Cup Size: n/a  Height: 5\' 8"  (1.727 m)  Weight:  274 lb (124.286 kg)  BMI:  Body mass index is 41.66 kg/(m^2). Tech Comments:  Last dose metoprolol 2:00pm yesterday    Nuclear Med Study 1 or 2 day study: 1 day  Stress Test Type:  Stress  Reading MD: Arvilla Meres, MD  Order Authorizing Provider:  Shawnie Pons, MD, Tereso Newcomer, [facility]  Resting Radionuclide: Technetium 15m Tetrofosmin  Resting Radionuclide Dose: 11 mCi   Stress Radionuclide:  Technetium 18m Tetrofosmin  Stress Radionuclide Dose: 33 mCi           Stress Protocol Rest HR: 89 Stress HR: 151  Rest BP: 130/70 Stress BP: 235/80  Exercise Time (min): 5:15 METS: 7.0   Predicted Max HR: 173 bpm % Max HR: 87.28 bpm Rate Pressure Product: 21308   Dose of Adenosine (mg):  n/a Dose of Lexiscan: n/a mg  Dose of Atropine (mg): n/a Dose of Dobutamine: n/a mcg/kg/min (at max HR)  Stress Test Technologist: Milana Na, EMT-P  Nuclear Technologist:  Domenic Polite, CNMT     Rest Procedure:  Myocardial perfusion imaging was performed at rest 45 minutes following the intravenous administration of  Technetium 65m Tetrofosmin. Rest ECG: NSR with non-specific ST-T wave changes  Stress Procedure:  The patient exercised for 5:15.  The patient stopped due to fatigue, sob, and denied any chest pain.  There were no significant ST-T wave changes and a rare pvc.  Technetium 102m Tetrofosmin was injected at peak exercise and myocardial perfusion imaging was performed after a brief delay. Stress ECG: No significant change from baseline ECG  QPS Raw Data Images:  Normal; no motion artifact; normal heart/lung ratio. Stress Images:  Decreased uptake in the lateral wall Rest Images:  Decreased uptake in the lateral wall Subtraction (SDS):  Lateral wall infarct with very mild peri-infarct ischemia Transient Ischemic Dilatation (Normal <1.22):  .86 Lung/Heart Ratio (Normal <0.45):  .35  Quantitative Gated Spect Images QGS EDV:  94 ml QGS ESV:  34 ml QGS cine images:  Mild lateral wall HK QGS EF: 63%  Impression Exercise Capacity:  Fair exercise capacity. BP Response:  Hypertensive blood pressure response. Clinical Symptoms:  There is dyspnea. ECG Impression:  No significant ST segment change suggestive of ischemia. Comparison with Prior Nuclear Study: No images to compare  Overall Impression:  Low risk stress nuclear study. Small lateral wall infarct with very mild peri-infarct ischemia   Arvilla Meres, MD 4:00 PM

## 2011-03-22 ENCOUNTER — Telehealth: Payer: Self-pay | Admitting: Cardiology

## 2011-03-22 NOTE — Telephone Encounter (Signed)
Spoke with pt, aware of stress test results. Follow up appt made Deliah Goody

## 2011-03-22 NOTE — Telephone Encounter (Signed)
New Msg: Pt calling wanting to know results of pt Stress Test. Please return pt call to discuss further.

## 2011-03-27 ENCOUNTER — Ambulatory Visit (INDEPENDENT_AMBULATORY_CARE_PROVIDER_SITE_OTHER): Payer: Managed Care, Other (non HMO) | Admitting: Cardiology

## 2011-03-27 ENCOUNTER — Encounter: Payer: Self-pay | Admitting: Cardiology

## 2011-03-27 DIAGNOSIS — R7302 Impaired glucose tolerance (oral): Secondary | ICD-10-CM

## 2011-03-27 DIAGNOSIS — I10 Essential (primary) hypertension: Secondary | ICD-10-CM

## 2011-03-27 DIAGNOSIS — R7309 Other abnormal glucose: Secondary | ICD-10-CM

## 2011-03-27 DIAGNOSIS — Z21 Asymptomatic human immunodeficiency virus [HIV] infection status: Secondary | ICD-10-CM

## 2011-03-27 DIAGNOSIS — E785 Hyperlipidemia, unspecified: Secondary | ICD-10-CM

## 2011-03-27 DIAGNOSIS — B20 Human immunodeficiency virus [HIV] disease: Secondary | ICD-10-CM

## 2011-03-27 DIAGNOSIS — I2510 Atherosclerotic heart disease of native coronary artery without angina pectoris: Secondary | ICD-10-CM

## 2011-03-27 NOTE — Assessment & Plan Note (Signed)
Followed at [facility].   Drugs have recently been changed.

## 2011-03-27 NOTE — Assessment & Plan Note (Addendum)
Numbers reviewed.  Needs weight loss.  Will not add meds. I encouraged him to discuss fish oils with his ID physicians.

## 2011-03-27 NOTE — Assessment & Plan Note (Signed)
Well controlled 

## 2011-03-27 NOTE — Patient Instructions (Addendum)
Please ask Infectious Disease if you can take fish oil.  Your physician recommends that you schedule a follow-up appointment in: 2 MONTHS  Your physician recommends that you continue on your current medications as directed. Please refer to the Current Medication list given to you today.

## 2011-03-27 NOTE — Assessment & Plan Note (Signed)
Did discuss his lipids, and role of weight loss.  He is followed by Kirby Funk.

## 2011-03-27 NOTE — Assessment & Plan Note (Signed)
No recurrent pain.  Discussed with him need to continue medications, and to improve lifestyle.
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
## 2011-03-27 NOTE — Progress Notes (Signed)
Patient ID: [name], male   DOB: 1966-04-10, 45 y.o.   MRN: [id number]

## 2011-04-12 ENCOUNTER — Other Ambulatory Visit: Payer: Self-pay | Admitting: Physician Assistant

## 2011-04-12 ENCOUNTER — Other Ambulatory Visit (INDEPENDENT_AMBULATORY_CARE_PROVIDER_SITE_OTHER): Payer: Managed Care, Other (non HMO) | Admitting: *Deleted

## 2011-04-12 DIAGNOSIS — I2510 Atherosclerotic heart disease of native coronary artery without angina pectoris: Secondary | ICD-10-CM

## 2011-04-12 DIAGNOSIS — E785 Hyperlipidemia, unspecified: Secondary | ICD-10-CM

## 2011-04-12 LAB — HEPATIC FUNCTION PANEL
ALT: 35 U/L (ref 0–53)
Total Protein: 6.9 g/dL (ref 6.0–8.3)

## 2011-04-12 LAB — LIPID PANEL
Cholesterol: 143 mg/dL (ref 0–200)
Triglycerides: 367 mg/dL — ABNORMAL HIGH (ref 0.0–149.0)

## 2011-04-19 ENCOUNTER — Telehealth: Payer: Self-pay | Admitting: Cardiology

## 2011-04-19 NOTE — Telephone Encounter (Signed)
Follow up from previous call:  Returning nurse call from yesterday.

## 2011-04-19 NOTE — Telephone Encounter (Signed)
I spoke with the pt and gave him the results of lipid and liver panel.  Per Dr Rosalyn Charters recommendation (03/27/11 OV) the pt needs to check with Infectious Disease prior to starting Fish Oil.  The pt is scheduled to see them in the next few weeks and will ask if fish oil is okay to take.  The pt will call our office and let us know if ID is okay with the pt taking fish oil and then we will schedule the pt for follow-up lab work. Pt agreed with plan.

## 2011-05-19 ENCOUNTER — Other Ambulatory Visit: Payer: Self-pay | Admitting: Physician Assistant
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
## 2011-05-29 ENCOUNTER — Ambulatory Visit: Payer: Managed Care, Other (non HMO) | Admitting: Cardiology

## 2011-06-27 ENCOUNTER — Encounter: Payer: Self-pay | Admitting: Cardiology

## 2011-06-27 ENCOUNTER — Ambulatory Visit (INDEPENDENT_AMBULATORY_CARE_PROVIDER_SITE_OTHER): Payer: Managed Care, Other (non HMO) | Admitting: Cardiology

## 2011-06-27 VITALS — BP 130/86 | HR 89 | Ht 69.0 in | Wt 284.1 lb

## 2011-06-27 DIAGNOSIS — R7302 Impaired glucose tolerance (oral): Secondary | ICD-10-CM

## 2011-06-27 DIAGNOSIS — E785 Hyperlipidemia, unspecified: Secondary | ICD-10-CM

## 2011-06-27 DIAGNOSIS — B20 Human immunodeficiency virus [HIV] disease: Secondary | ICD-10-CM

## 2011-06-27 DIAGNOSIS — R7309 Other abnormal glucose: Secondary | ICD-10-CM

## 2011-06-27 DIAGNOSIS — F17200 Nicotine dependence, unspecified, uncomplicated: Secondary | ICD-10-CM

## 2011-06-27 DIAGNOSIS — Z720 Tobacco use: Secondary | ICD-10-CM

## 2011-06-27 DIAGNOSIS — I2510 Atherosclerotic heart disease of native coronary artery without angina pectoris: Secondary | ICD-10-CM

## 2011-06-27 DIAGNOSIS — Z21 Asymptomatic human immunodeficiency virus [HIV] infection status: Secondary | ICD-10-CM

## 2011-06-27 DIAGNOSIS — I10 Essential (primary) hypertension: Secondary | ICD-10-CM
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
## 2011-06-27 NOTE — Patient Instructions (Signed)
Your physician recommends that you continue on your current medications as directed. Please refer to the Current Medication list given to you today.  Your physician recommends that you schedule a follow-up appointment in: 3 MONTHS  

## 2011-06-28 DIAGNOSIS — Z720 Tobacco use: Secondary | ICD-10-CM | POA: Insufficient documentation

## 2011-06-28 NOTE — Assessment & Plan Note (Signed)
LDL 89, Trig 358.  HDL 29.  Counseling given.  Has a lot of room for weight loss, and improved diet.  Continue to monitor for now.  Tolerates current dose of pravachol.  If remains elevated after fish oil, and lifesytle, then increase dose to target LDL 70.

## 2011-06-28 NOTE — Assessment & Plan Note (Signed)
Followed at [facility].

## 2011-06-28 NOTE — Assessment & Plan Note (Signed)
He is currently asymptomatic.  He has RCA disease, but no current ischemia documented by radionuclide imaging.  He has struggled with improvement in lifestyle and we are working on that at present.  We will continue to monitor his status.  He has add fish oil to his regimen after speaking with the HIV clinic.  Extensive counseling given today.

## 2011-06-28 NOTE — Assessment & Plan Note (Signed)
He is stable.  BP relatively well controlled.  Continue to monitor.  TS

## 2011-06-28 NOTE — Assessment & Plan Note (Signed)
May need therapy for this.  He needs to see primary care.  TS

## 2011-06-28 NOTE — Assessment & Plan Note (Signed)
Reviewed in detail the consequences.  He understands the need to quit.  He has struggled.  Support offered.

## 2011-07-21 ENCOUNTER — Other Ambulatory Visit: Payer: Self-pay | Admitting: Cardiology

## 2011-09-16 ENCOUNTER — Other Ambulatory Visit: Payer: Self-pay | Admitting: Cardiology

## 2011-09-23 ENCOUNTER — Encounter: Payer: Self-pay | Admitting: Cardiology

## 2011-10-14 ENCOUNTER — Encounter: Payer: Self-pay | Admitting: Cardiology

## 2011-10-14 ENCOUNTER — Ambulatory Visit (INDEPENDENT_AMBULATORY_CARE_PROVIDER_SITE_OTHER): Payer: Managed Care, Other (non HMO) | Admitting: Cardiology

## 2011-10-14 VITALS — BP 130/88 | HR 75 | Ht 68.0 in | Wt 281.0 lb

## 2011-10-14 DIAGNOSIS — F17200 Nicotine dependence, unspecified, uncomplicated: Secondary | ICD-10-CM

## 2011-10-14 DIAGNOSIS — E785 Hyperlipidemia, unspecified: Secondary | ICD-10-CM

## 2011-10-14 DIAGNOSIS — Z720 Tobacco use: Secondary | ICD-10-CM

## 2011-10-14 DIAGNOSIS — I2510 Atherosclerotic heart disease of native coronary artery without angina pectoris: Secondary | ICD-10-CM

## 2011-10-14 DIAGNOSIS — R7302 Impaired glucose tolerance (oral): Secondary | ICD-10-CM

## 2011-10-14 DIAGNOSIS — R7309 Other abnormal glucose: Secondary | ICD-10-CM

## 2011-10-14 NOTE — Assessment & Plan Note (Signed)
This time for lipid liver profile.

## 2011-10-14 NOTE — Assessment & Plan Note (Signed)
Discussed.

## 2011-10-14 NOTE — Patient Instructions (Addendum)
Your physician wants you to follow-up in: 4 MONTHS with Dr Riley Kill. You will receive a reminder letter in the mail two months in advance. If you don't receive a letter, please call our office to schedule the follow-up appointment.  Your physician recommends that you continue on your current medications as directed. Please refer to the Current Medication list given to you today.  Your physician recommends that you return for a FASTING LIPID, Glucose, HgbA1c and LIVER Profile--nothing to eat or drink after midnight, lab opens at 8:30 (10/18/11)

## 2011-10-14 NOTE — Assessment & Plan Note (Signed)
At the time of his lipid liver profile we will get a fasting glucose, and hemoglobin A1c
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
## 2011-10-14 NOTE — Assessment & Plan Note (Signed)
The patient remained stable from a cardiac standpoint. I O. discussion with him today about the importance of secondary prevention. He tells me that he had some difficulty dealing with his HIV diagnosis number of years ago. He said he took about 4 years to get correct. He says that he will have to do this on his own timeframe. We discussed the various risk factors that he has. He will continue to work on this as best he can.

## 2011-10-15 ENCOUNTER — Other Ambulatory Visit: Payer: Self-pay | Admitting: Cardiology

## 2011-10-18 ENCOUNTER — Other Ambulatory Visit: Payer: Managed Care, Other (non HMO)

## 2012-01-14 ENCOUNTER — Other Ambulatory Visit: Payer: Self-pay | Admitting: Cardiology

## 2012-02-07 ENCOUNTER — Other Ambulatory Visit: Payer: Self-pay | Admitting: Cardiology

## 2012-02-08 ENCOUNTER — Other Ambulatory Visit: Payer: Self-pay | Admitting: Cardiology

## 2012-04-20 ENCOUNTER — Other Ambulatory Visit: Payer: Self-pay | Admitting: *Deleted

## 2012-04-20 MED ORDER — METOPROLOL TARTRATE 25 MG PO TABS: ORAL_TABLET | ORAL | Status: DC

## 2012-04-23 ENCOUNTER — Other Ambulatory Visit: Payer: Self-pay | Admitting: *Deleted

## 2012-04-23 ENCOUNTER — Other Ambulatory Visit: Payer: Self-pay

## 2012-04-23 MED ORDER — PRAVASTATIN SODIUM 40 MG PO TABS: 40.0000 mg | ORAL_TABLET | Freq: Every day | ORAL | Status: DC

## 2012-04-23 MED ORDER — CLOPIDOGREL BISULFATE 75 MG PO TABS: 75.0000 mg | ORAL_TABLET | Freq: Every day | ORAL | Status: DC

## 2012-04-24 ENCOUNTER — Other Ambulatory Visit: Payer: Self-pay | Admitting: Cardiology

## 2012-04-24 MED ORDER — CLOPIDOGREL BISULFATE 75 MG PO TABS: 75.0000 mg | ORAL_TABLET | Freq: Every day | ORAL | Status: DC

## 2012-04-24 MED ORDER — PRAVASTATIN SODIUM 40 MG PO TABS: 40.0000 mg | ORAL_TABLET | Freq: Every day | ORAL | Status: DC

## 2012-05-01 ENCOUNTER — Ambulatory Visit (INDEPENDENT_AMBULATORY_CARE_PROVIDER_SITE_OTHER): Payer: Managed Care, Other (non HMO) | Admitting: Cardiology

## 2012-05-01 ENCOUNTER — Encounter: Payer: Self-pay | Admitting: Cardiology

## 2012-05-01 VITALS — BP 120/82 | HR 78 | Ht 68.0 in | Wt 280.0 lb

## 2012-05-01 DIAGNOSIS — B20 Human immunodeficiency virus [HIV] disease: Secondary | ICD-10-CM

## 2012-05-01 DIAGNOSIS — Z720 Tobacco use: Secondary | ICD-10-CM

## 2012-05-01 DIAGNOSIS — R7302 Impaired glucose tolerance (oral): Secondary | ICD-10-CM

## 2012-05-01 DIAGNOSIS — I2510 Atherosclerotic heart disease of native coronary artery without angina pectoris: Secondary | ICD-10-CM

## 2012-05-01 DIAGNOSIS — I25810 Atherosclerosis of coronary artery bypass graft(s) without angina pectoris: Secondary | ICD-10-CM

## 2012-05-01 DIAGNOSIS — F17200 Nicotine dependence, unspecified, uncomplicated: Secondary | ICD-10-CM

## 2012-05-01 DIAGNOSIS — Z21 Asymptomatic human immunodeficiency virus [HIV] infection status: Secondary | ICD-10-CM

## 2012-05-01 DIAGNOSIS — E785 Hyperlipidemia, unspecified: Secondary | ICD-10-CM

## 2012-05-01 DIAGNOSIS — R7309 Other abnormal glucose: Secondary | ICD-10-CM

## 2012-05-01 MED ORDER — CLOPIDOGREL BISULFATE 75 MG PO TABS: 75.0000 mg | ORAL_TABLET | Freq: Every day | ORAL | Status: DC

## 2012-05-01 MED ORDER — PRAVASTATIN SODIUM 40 MG PO TABS: 40.0000 mg | ORAL_TABLET | Freq: Every day | ORAL | Status: DC

## 2012-05-01 MED ORDER — METOPROLOL TARTRATE 25 MG PO TABS: ORAL_TABLET | ORAL | Status: DC

## 2012-05-01 NOTE — Patient Instructions (Addendum)
Follow-up with Dr. Verne Carrow  Continue current medications as directed
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
## 2012-05-04 ENCOUNTER — Telehealth: Payer: Self-pay | Admitting: Cardiology

## 2012-05-04 MED ORDER — METOPROLOL TARTRATE 25 MG PO TABS: ORAL_TABLET | ORAL | Status: DC

## 2012-05-04 NOTE — Telephone Encounter (Signed)
New Problem:    Patient called in because his metoprolol tartrate (LOPRESSOR) 25 MG tablet was called in for 30 days and he needs them called in for 90 days so huis insurance will cover them.  Please call back if you have any questions.

## 2012-05-04 NOTE — Telephone Encounter (Signed)
RX sent into pharmacy. Pt notified. 

## 2012-05-18 NOTE — Assessment & Plan Note (Signed)
Followed at [facility].

## 2012-05-18 NOTE — Assessment & Plan Note (Signed)
Continues to smoke.  He understands that there is a downside.

## 2012-05-18 NOTE — Assessment & Plan Note (Signed)
Suspect this is not insignificant.  Has all labs done at [facility] due to HIV follow up. Last at [facility] was 1

## 2012-05-18 NOTE — Assessment & Plan Note (Signed)
Did not have follow up labs.  This would be helpful when he is seen back.

## 2012-05-18 NOTE — Assessment & Plan Note (Signed)
Continues on a medical regimen.  He remains on DAPT.  He has fairly prominent disease, is diabetic, and continues to smoke, with a chronic underlying inflammatory disease.  Options are to reduce to [facility] or continue, and I would lean in the direction of continue of DAPT for now.

## 2013-01-20 ENCOUNTER — Ambulatory Visit (INDEPENDENT_AMBULATORY_CARE_PROVIDER_SITE_OTHER): Payer: Managed Care, Other (non HMO) | Admitting: Cardiovascular Disease

## 2013-01-20 ENCOUNTER — Encounter: Payer: Self-pay | Admitting: Cardiovascular Disease

## 2013-01-20 VITALS — BP 150/80 | HR 86 | Wt 280.0 lb

## 2013-01-20 DIAGNOSIS — I10 Essential (primary) hypertension: Secondary | ICD-10-CM

## 2013-01-20 DIAGNOSIS — I2510 Atherosclerotic heart disease of native coronary artery without angina pectoris: Secondary | ICD-10-CM

## 2013-01-20 DIAGNOSIS — F17200 Nicotine dependence, unspecified, uncomplicated: Secondary | ICD-10-CM

## 2013-01-20 DIAGNOSIS — E785 Hyperlipidemia, unspecified: Secondary | ICD-10-CM

## 2013-01-20 DIAGNOSIS — Z720 Tobacco use: Secondary | ICD-10-CM

## 2013-01-20 NOTE — Patient Instructions (Addendum)
Your physician wants you to follow-up in:  6 months. You will receive a reminder letter in the mail two months in advance. If you don't receive a letter, please call our office to schedule the follow-up appointment.   
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
## 2013-06-29 ENCOUNTER — Ambulatory Visit (INDEPENDENT_AMBULATORY_CARE_PROVIDER_SITE_OTHER): Payer: Managed Care, Other (non HMO) | Admitting: Cardiovascular Disease

## 2013-06-29 ENCOUNTER — Encounter: Payer: Self-pay | Admitting: Cardiovascular Disease

## 2013-06-29 VITALS — BP 120/64 | HR 81 | Ht 68.0 in | Wt 295.0 lb

## 2013-06-29 DIAGNOSIS — F17200 Nicotine dependence, unspecified, uncomplicated: Secondary | ICD-10-CM

## 2013-06-29 DIAGNOSIS — I25810 Atherosclerosis of coronary artery bypass graft(s) without angina pectoris: Secondary | ICD-10-CM

## 2013-06-29 DIAGNOSIS — Z720 Tobacco use: Secondary | ICD-10-CM

## 2013-06-29 DIAGNOSIS — E785 Hyperlipidemia, unspecified: Secondary | ICD-10-CM

## 2013-06-29 MED ORDER — CLOPIDOGREL BISULFATE 75 MG PO TABS: 75.0000 mg | ORAL_TABLET | Freq: Every day | ORAL | Status: DC

## 2013-06-29 MED ORDER — PRAVASTATIN SODIUM 40 MG PO TABS: 40.0000 mg | ORAL_TABLET | Freq: Every day | ORAL | Status: DC
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
## 2013-06-29 NOTE — Patient Instructions (Signed)
Your physician wants you to follow-up in:  6 months. You will receive a reminder letter in the mail two months in advance. If you don't receive a letter, please call our office to schedule the follow-up appointment.   

## 2013-07-22 ENCOUNTER — Other Ambulatory Visit: Payer: Self-pay | Admitting: Cardiology

## 2013-10-18 ENCOUNTER — Emergency Department (HOSPITAL_BASED_OUTPATIENT_CLINIC_OR_DEPARTMENT_OTHER): Payer: Managed Care, Other (non HMO)

## 2013-10-18 ENCOUNTER — Encounter (HOSPITAL_BASED_OUTPATIENT_CLINIC_OR_DEPARTMENT_OTHER): Payer: Self-pay | Admitting: Emergency Medicine

## 2013-10-18 ENCOUNTER — Emergency Department (HOSPITAL_BASED_OUTPATIENT_CLINIC_OR_DEPARTMENT_OTHER)
Admission: EM | Admit: 2013-10-18 | Discharge: 2013-10-18 | Disposition: A | Discharge status: Home / Self Care | Payer: Managed Care, Other (non HMO) | Attending: Emergency Medicine | Admitting: Emergency Medicine

## 2013-10-18 DIAGNOSIS — Z7902 Long term (current) use of antithrombotics/antiplatelets: Secondary | ICD-10-CM | POA: Insufficient documentation

## 2013-10-18 DIAGNOSIS — F17200 Nicotine dependence, unspecified, uncomplicated: Secondary | ICD-10-CM | POA: Insufficient documentation

## 2013-10-18 DIAGNOSIS — Z7982 Long term (current) use of aspirin: Secondary | ICD-10-CM | POA: Insufficient documentation

## 2013-10-18 DIAGNOSIS — Z79899 Other long term (current) drug therapy: Secondary | ICD-10-CM | POA: Insufficient documentation

## 2013-10-18 DIAGNOSIS — E785 Hyperlipidemia, unspecified: Secondary | ICD-10-CM | POA: Insufficient documentation

## 2013-10-18 DIAGNOSIS — I10 Essential (primary) hypertension: Secondary | ICD-10-CM | POA: Insufficient documentation

## 2013-10-18 DIAGNOSIS — I252 Old myocardial infarction: Secondary | ICD-10-CM | POA: Insufficient documentation

## 2013-10-18 DIAGNOSIS — J45909 Unspecified asthma, uncomplicated: Secondary | ICD-10-CM | POA: Insufficient documentation

## 2013-10-18 DIAGNOSIS — Z8619 Personal history of other infectious and parasitic diseases: Secondary | ICD-10-CM | POA: Insufficient documentation

## 2013-10-18 DIAGNOSIS — N200 Calculus of kidney: Secondary | ICD-10-CM

## 2013-10-18 DIAGNOSIS — Z8719 Personal history of other diseases of the digestive system: Secondary | ICD-10-CM | POA: Insufficient documentation

## 2013-10-18 DIAGNOSIS — I2510 Atherosclerotic heart disease of native coronary artery without angina pectoris: Secondary | ICD-10-CM | POA: Insufficient documentation

## 2013-10-18 DIAGNOSIS — Z21 Asymptomatic human immunodeficiency virus [HIV] infection status: Secondary | ICD-10-CM | POA: Insufficient documentation

## 2013-10-18 LAB — CBC WITH DIFFERENTIAL/PLATELET
BAND NEUTROPHILS: 0 % (ref 0–10)
BASOS PCT: 0 % (ref 0–1)
Basophils Absolute: 0 10*3/uL (ref 0.0–0.1)
Blasts: 0 %
EOS ABS: 0.3 10*3/uL (ref 0.0–0.7)
EOS PCT: 2 % (ref 0–5)
HEMATOCRIT: 45.3 % (ref 39.0–52.0)
HEMOGLOBIN: 15.4 g/dL (ref 13.0–17.0)
LYMPHS PCT: 43 % (ref 12–46)
Lymphs Abs: 6 10*3/uL — ABNORMAL HIGH (ref 0.7–4.0)
MCH: 29.3 pg (ref 26.0–34.0)
MCHC: 34 g/dL (ref 30.0–36.0)
MCV: 86.1 fL (ref 78.0–100.0)
MONO ABS: 1 10*3/uL (ref 0.1–1.0)
Metamyelocytes Relative: 0 %
Monocytes Relative: 7 % (ref 3–12)
Myelocytes: 0 %
Neutro Abs: 6.7 10*3/uL (ref 1.7–7.7)
Neutrophils Relative %: 48 % (ref 43–77)
Platelets: 243 10*3/uL (ref 150–400)
Promyelocytes Absolute: 0 %
RBC: 5.26 MIL/uL (ref 4.22–5.81)
RDW: 15.2 % (ref 11.5–15.5)
WBC: 14 10*3/uL — ABNORMAL HIGH (ref 4.0–10.5)
nRBC: 0 /100 WBC

## 2013-10-18 LAB — URINALYSIS, ROUTINE W REFLEX MICROSCOPIC
BILIRUBIN URINE: NEGATIVE
Glucose, UA: NEGATIVE mg/dL
KETONES UR: NEGATIVE mg/dL
Leukocytes, UA: NEGATIVE
NITRITE: NEGATIVE
PH: 6.5 (ref 5.0–8.0)
Protein, ur: NEGATIVE mg/dL
Specific Gravity, Urine: 1.014 (ref 1.005–1.030)
Urobilinogen, UA: 1 mg/dL (ref 0.0–1.0)

## 2013-10-18 LAB — BASIC METABOLIC PANEL
BUN: 15 mg/dL (ref 6–23)
CALCIUM: 9.9 mg/dL (ref 8.4–10.5)
CO2: 23 mEq/L (ref 19–32)
Chloride: 105 mEq/L (ref 96–112)
Creatinine, Ser: 1.2 mg/dL (ref 0.50–1.35)
GFR calc Af Amer: 80 mL/min — ABNORMAL LOW (ref >90)
GFR calc non Af Amer: 69 mL/min — ABNORMAL LOW (ref >90)
Glucose, Bld: 114 mg/dL — ABNORMAL HIGH (ref 70–99)
Potassium: 3.8 mEq/L (ref 3.7–5.3)
Sodium: 143 mEq/L (ref 137–147)

## 2013-10-18 LAB — URINE MICROSCOPIC-ADD ON

## 2013-10-18 MED ORDER — IBUPROFEN 800 MG PO TABS: 800.0000 mg | ORAL_TABLET | Freq: Three times a day (TID) | ORAL | Status: DC

## 2013-10-18 MED ORDER — TAMSULOSIN HCL 0.4 MG PO CAPS: 0.4000 mg | ORAL_CAPSULE | Freq: Every day | ORAL | Status: DC

## 2013-10-18 MED ORDER — ONDANSETRON 8 MG PO TBDP: ORAL_TABLET | ORAL | Status: DC

## 2013-10-18 MED ORDER — OXYCODONE-ACETAMINOPHEN 5-325 MG PO TABS: 1.0000 | ORAL_TABLET | Freq: Four times a day (QID) | ORAL | Status: DC | PRN

## 2013-10-18 MED ORDER — TAMSULOSIN HCL 0.4 MG PO CAPS
0.4000 mg | ORAL_CAPSULE | Freq: Every day | ORAL | Status: DC
  Administered 2013-10-18: 0.4 mg via ORAL
  Filled 2013-10-18: qty 1

## 2013-10-18 MED ORDER — ONDANSETRON HCL 4 MG/2ML IJ SOLN
4.0000 mg | Freq: Once | INTRAMUSCULAR | Status: AC
  Administered 2013-10-18: 4 mg via INTRAVENOUS
  Filled 2013-10-18: qty 2

## 2013-10-18 MED ORDER — KETOROLAC TROMETHAMINE 30 MG/ML IJ SOLN
30.0000 mg | Freq: Once | INTRAMUSCULAR | Status: AC
  Administered 2013-10-18: 30 mg via INTRAVENOUS
  Filled 2013-10-18: qty 1

## 2013-10-18 NOTE — ED Notes (Signed)
Pt given a strainer
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
## 2013-10-18 NOTE — ED Notes (Signed)
Pt developed llq abdominal pain that radiates to right flank and back

## 2013-10-18 NOTE — Discharge Instructions (Signed)

## 2013-10-20 DIAGNOSIS — N200 Calculus of kidney: Secondary | ICD-10-CM

## 2013-10-20 HISTORY — DX: Calculus of kidney: N20.0

## 2013-10-26 ENCOUNTER — Encounter (HOSPITAL_COMMUNITY): Payer: Self-pay | Admitting: Pharmacy Technician

## 2013-10-26 ENCOUNTER — Other Ambulatory Visit: Payer: Self-pay | Admitting: Urology

## 2013-10-26 ENCOUNTER — Telehealth: Payer: Self-pay | Admitting: Cardiovascular Disease

## 2013-10-26 NOTE — Telephone Encounter (Signed)
Ok to hold Plavix 5-7 days prior to surgery. Can we fax to this number? Thanks, Darlina Guys

## 2013-10-26 NOTE — Telephone Encounter (Signed)
New message     Can they hold plavix 5 days prior to lithotripsy?  Please fax to [phone]

## 2013-10-26 NOTE — Telephone Encounter (Signed)
Will fax today

## 2013-10-27 ENCOUNTER — Encounter (HOSPITAL_COMMUNITY): Payer: Self-pay | Admitting: *Deleted

## 2013-10-27 NOTE — Progress Notes (Signed)
Patient denies heart attack and denies any chest pain. On phone interview for [facility].

## 2013-11-01 ENCOUNTER — Encounter (HOSPITAL_COMMUNITY)
Admission: RE | Disposition: A | Discharge status: Home / Self Care | Payer: Self-pay | Source: Ambulatory Visit | Attending: Urology

## 2013-11-01 ENCOUNTER — Ambulatory Visit (HOSPITAL_COMMUNITY)
Admission: RE | Admit: 2013-11-01 | Discharge: 2013-11-01 | Disposition: A | Discharge status: Home / Self Care | Payer: Managed Care, Other (non HMO) | Source: Ambulatory Visit | Attending: Urology | Admitting: Urology

## 2013-11-01 ENCOUNTER — Encounter (HOSPITAL_COMMUNITY): Payer: Self-pay | Admitting: *Deleted

## 2013-11-01 ENCOUNTER — Ambulatory Visit (HOSPITAL_COMMUNITY): Payer: Managed Care, Other (non HMO)

## 2013-11-01 DIAGNOSIS — N200 Calculus of kidney: Secondary | ICD-10-CM | POA: Insufficient documentation

## 2013-11-01 DIAGNOSIS — I252 Old myocardial infarction: Secondary | ICD-10-CM | POA: Insufficient documentation

## 2013-11-01 DIAGNOSIS — Z79899 Other long term (current) drug therapy: Secondary | ICD-10-CM | POA: Insufficient documentation

## 2013-11-01 DIAGNOSIS — E669 Obesity, unspecified: Secondary | ICD-10-CM | POA: Insufficient documentation

## 2013-11-01 DIAGNOSIS — Z7902 Long term (current) use of antithrombotics/antiplatelets: Secondary | ICD-10-CM | POA: Insufficient documentation

## 2013-11-01 DIAGNOSIS — N201 Calculus of ureter: Secondary | ICD-10-CM | POA: Insufficient documentation

## 2013-11-01 DIAGNOSIS — B181 Chronic viral hepatitis B without delta-agent: Secondary | ICD-10-CM | POA: Insufficient documentation

## 2013-11-01 DIAGNOSIS — E785 Hyperlipidemia, unspecified: Secondary | ICD-10-CM | POA: Insufficient documentation

## 2013-11-01 DIAGNOSIS — I10 Essential (primary) hypertension: Secondary | ICD-10-CM | POA: Insufficient documentation

## 2013-11-01 DIAGNOSIS — Z21 Asymptomatic human immunodeficiency virus [HIV] infection status: Secondary | ICD-10-CM | POA: Insufficient documentation

## 2013-11-01 DIAGNOSIS — I2510 Atherosclerotic heart disease of native coronary artery without angina pectoris: Secondary | ICD-10-CM | POA: Insufficient documentation

## 2013-11-01 DIAGNOSIS — Z7982 Long term (current) use of aspirin: Secondary | ICD-10-CM | POA: Insufficient documentation

## 2013-11-01 DIAGNOSIS — F17200 Nicotine dependence, unspecified, uncomplicated: Secondary | ICD-10-CM | POA: Insufficient documentation

## 2013-11-01 DIAGNOSIS — Z6841 Body Mass Index (BMI) 40.0 and over, adult: Secondary | ICD-10-CM | POA: Insufficient documentation

## 2013-11-01 DIAGNOSIS — G4730 Sleep apnea, unspecified: Secondary | ICD-10-CM | POA: Insufficient documentation

## 2013-11-01 HISTORY — DX: Calculus of kidney: N20.0

## 2013-11-01 HISTORY — DX: Sleep apnea, unspecified: G47.30

## 2013-11-01 SURGERY — LITHOTRIPSY, ESWL
Anesthesia: LOCAL | Laterality: Right

## 2013-11-01 MED ORDER — DIPHENHYDRAMINE HCL 25 MG PO CAPS
25.0000 mg | ORAL_CAPSULE | ORAL | Status: AC
  Administered 2013-11-01: 25 mg via ORAL
  Filled 2013-11-01: qty 1

## 2013-11-01 MED ORDER — DIAZEPAM 5 MG PO TABS
10.0000 mg | ORAL_TABLET | ORAL | Status: AC
Start: 2013-11-01 — End: 2013-11-01
  Administered 2013-11-01: 10 mg via ORAL
  Filled 2013-11-01: qty 2

## 2013-11-01 MED ORDER — CIPROFLOXACIN HCL 500 MG PO TABS
500.0000 mg | ORAL_TABLET | ORAL | Status: AC
  Administered 2013-11-01: 500 mg via ORAL
  Filled 2013-11-01: qty 1

## 2013-11-01 MED ORDER — DEXTROSE-NACL 5-0.45 % IV SOLN
INTRAVENOUS | Status: DC
  Administered 2013-11-01: 15:00:00 via INTRAVENOUS

## 2013-11-01 NOTE — H&P (Signed)
Reason For Visit [name] presents today for a 1 week follow up   History of Present Illness [name] is a new patient of Dr. Gaynelle Arabian seen last week for a 6 mm right UPJ stone detected via CT in the ER 6/29. He has been taking tamsulosin in an attempt to medically expel the stone. He presents today for a 1 week follow up. He does not believe he has passed the stone despite the fact he has taken tamsulosin daily. He has ongoing right sided lower back pain. Nothing makes this better or worse. He denies any LUTS, dysuria, or gross hematuria. He denies n/v or f/c.    He is positive to HIV and Hepatitis B.   Past Medical History Problems  1. History of CAD (coronary artery disease) (414.00) 2. History of acute myocardial infarction (412) 3. History of asthma (V12.69) 4. History of depression (V11.8) 5. History of hepatitis B virus infection (V12.09) 6. History of human immunodeficiency virus infection (V12.09) 7. History of hyperlipidemia (V12.29) 8. History of hypertension (V12.59) 9. History of impaired glucose tolerance (V12.29) 10. History of ulcerative colitis (V12.79) 11. History of Obstructive sleep apnea, adult (327.23)  Surgical History Problems  1. History of Exploratory Laparotomy 2. History of Leg Repair 3. History of Repair Of Humerus / Arm 4. History of Tonsillectomy  Current Meds 1. Albuterol Sulfate HFA 108 MCG/ACT AERS;  Therapy: (Recorded:30Jun2015) to Recorded 2. Aspirin 81 MG Oral Tablet;  Therapy: (Recorded:30Jun2015) to Recorded 3. Baclofen 10 MG Oral Tablet;  Therapy: (Recorded:30Jun2015) to Recorded 4. Calan SR 120 MG Oral Tablet Extended Release;  Therapy: (Recorded:30Jun2015) to Recorded 5. Flonase 50 MCG/ACT Nasal Suspension;  Therapy: (Recorded:30Jun2015) to Recorded 6. Furosemide 40 MG Oral Tablet;  Therapy: (Recorded:30Jun2015) to Recorded 7. Ibuprofen 800 MG Oral Tablet;  Therapy: (Recorded:30Jun2015) to Recorded 8. Isentress 400 MG Oral  Tablet;  Therapy: (Recorded:30Jun2015) to Recorded 9. Metoprolol Tartrate 25 MG Oral Tablet;  Therapy: (Recorded:30Jun2015) to Recorded 10. Nitrostat 0.4 MG Sublingual Tablet Sublingual;   Therapy: (Recorded:30Jun2015) to Recorded 11. Ondansetron 8 MG Oral Tablet Dispersible;   Therapy: (Recorded:30Jun2015) to Recorded 12. Oxycodone-Acetaminophen 5-325 MG Oral Tablet;   Therapy: (Recorded:30Jun2015) to Recorded 13. Plavix 75 MG Oral Tablet;   Therapy: (Recorded:30Jun2015) to Recorded 14. Pravastatin Sodium 40 MG Oral Tablet;   Therapy: (Recorded:30Jun2015) to Recorded 15. Tamsulosin HCl - 0.4 MG Oral Capsule;   Therapy: (Recorded:30Jun2015) to Recorded 16. Topamax 200 MG Oral Tablet;   Therapy: (Recorded:30Jun2015) to Recorded 17. Truvada 200-300 MG Oral Tablet;   Therapy: (Recorded:30Jun2015) to Recorded 18. Zolpidem Tartrate 10 MG Oral Tablet;   Therapy: (Recorded:30Jun2015) to Recorded  Allergies Medication  1. No Known Drug Allergies  Family History Problems  1. Family history of kidney stones (V18.69) : Mother 2. Family history of prostate cancer (Z61.09) : Father  Social History Problems  1. Alcohol use (V49.89)   1-2 per week 2. Caffeine use (V49.89)   3-4 per day 3. Current every day smoker (305.1) 4. Occupation   Government social research officer 5. Single 6. Smokes cigarettes (305.1)   1/2 ppd x 55yr  Review of Systems Genitourinary, constitutional, musculoskeletal and gastrointestinal system(s) were reviewed and pertinent findings if present are noted.  Musculoskeletal: back pain.    Vitals Vital Signs [Data Includes: Last 1 Day]  Recorded: [id number]:02AM  Blood Pressure: 142 / 87 Temperature: 98.3 F Heart Rate: 79  Physical Exam Constitutional: Well nourished and well developed . No acute distress.  Pulmonary: No respiratory distress and normal respiratory  rhythm and effort.  Abdomen: The abdomen is soft and nontender. No CVA tenderness.     Results/Data Urine [Data Includes: Last 1 Day]   [id number]  COLOR YELLOW   APPEARANCE CLEAR   SPECIFIC GRAVITY 1.015   pH 6.5   GLUCOSE NEG mg/dL  BILIRUBIN NEG   KETONE NEG mg/dL  BLOOD NEG   PROTEIN NEG mg/dL  UROBILINOGEN 0.2 mg/dL  NITRITE NEG   LEUKOCYTE ESTERASE NEG    The following images/tracing/specimen were independently visualized: Marland Kitchen KUB: punctate right lower pole stone, large proximal to mid right ureteral stone, no stones seen within left kidney or along the course of the left ureter    Assessment Assessed  1. Right kidney stone (592.0)  Per KUB [name] right ureteral stone is still in his proximal to mid ureter. We discussed options including continuing with MET vs a stone removal procedure. He is uncomfortable enough that he would like to proceed with surgical intervention. We discussed the risks and benefits of ESWL vs ureteroscopy. He would like to proceed with ESWL. Unfortunately Dr. Gaynelle Arabian is out this week. At the request of [name] I will ask another physician in his absence. In the meantime he will continue Rapaflo daily. If he is able to pass the stone he will let us know. He also knows to call should he have pain or n/v uncontrolled with medication or a temperature of 100.5 or greater.   Plan Health Maintenance  1. UA With REFLEX; [Do Not Release]; Status:Complete;   Done: [id number] 07:53AM Right kidney stone  2. Start: Oxycodone-Acetaminophen 5-325 MG Oral Tablet; TAKE 1 TABLET EVERY 6  HOURS AS NEEDED FOR PAIN 3. KUB; Status:Complete;   Done: [id number] 12:00AM 4. Follow-up NP/PA [facility]  Follow-up  Status: Active  Requested for: [phone]  1. Refer to alternate MD for possible ESWL in Dr. Arlyn Leak absence  2. Follow up accordingly   Signatures Electronically signed by : Anders Grant, NP-C; Oct 25 2013  8:57AM EST

## 2013-11-01 NOTE — Discharge Instructions (Signed)
Lithotripsy, Care After °Refer to this sheet in the next few weeks. These instructions provide you with information on caring for yourself after your procedure. Your health care provider may also give you more specific instructions. Your treatment has been planned according to current medical practices, but problems sometimes occur. Call your health care provider if you have any problems or questions after your procedure. °WHAT TO EXPECT AFTER THE PROCEDURE  °· Your urine may have a red tinge for a few days after treatment. Blood loss is usually minimal. °· You may have soreness in the back or flank area. This usually goes away after a few days. The procedure can cause blotches or bruises on the back where the pressure wave enters the skin. These marks usually cause only minimal discomfort and should disappear in a short time. °· Stone fragments should begin to pass within 24 hours of treatment. However, a delayed passage is not unusual. °· You may have pain, discomfort, and feel sick to your stomach (nauseated) when the crushed fragments of stone are passed down the tube from the kidney to the bladder. Stone fragments can pass soon after the procedure and may last for up to 4-8 weeks. °· A small number of patients may have severe pain when stone fragments are not able to pass, which leads to an obstruction. °· If your stone is greater than 1 inch (2.5 cm) in diameter or if you have multiple stones that have a combined diameter greater than 1 inch (2.5 cm), you may require more than one treatment. °· If you had a stent placed prior to your procedure, you may experience some discomfort, especially during urination. You may experience the pain or discomfort in your flank or back, or you may experience a sharp pain or discomfort at the base of your penis or in your lower abdomen. The discomfort usually lasts only a few minutes after urinating. °HOME CARE INSTRUCTIONS  °· Rest at home until you feel your energy  improving. °· Only take over-the-counter or prescription medicines for pain, discomfort, or fever as directed by your health care provider. Depending on the type of lithotripsy, you may need to take antibiotics and anti-inflammatory medicines for a few days. °· Drink enough water and fluids to keep your urine clear or pale yellow. This helps "flush" your kidneys. It helps pass any remaining pieces of stone and prevents stones from coming back. °· Most people can resume daily activities within 1-2 days after standard lithotripsy. It can take longer to recover from laser and percutaneous lithotripsy. °· If the stones are in your urinary system, you may be asked to strain your urine at home to look for stones. Any stones that are found can be sent to a medical lab for examination. °· Visit your health care provider for a follow-up appointment in a few weeks. Your doctor may remove your stent if you have one. Your health care provider will also check to see whether stone particles still remain. °SEEK MEDICAL CARE IF:  °· Your pain is not relieved by medicine. °· You have a lasting nauseous feeling. °· You feel there is too much blood in the urine. °· You develop persistent problems with frequent or painful urination that does not at least partially improve after 2 days following the procedure. °· You have a congested cough. °· You feel lightheaded. °· You develop a rash or any other signs that might suggest an allergic problem. °· You develop any reaction or side effects to   your medicine(s). SEEK IMMEDIATE MEDICAL CARE IF:   You experience severe back or flank pain or both.  You see nothing but blood when you urinate.  You cannot pass any urine at all.  You have a fever or shaking chills.  You develop shortness of breath, difficulty breathing, or chest pain.  You develop vomiting that will not stop after 6-8 hours.  You have a fainting episode. Document Released: 04/28/2007 Document Revised: 01/27/2013  Document Reviewed: 10/22/2012 [facility] Patient Information 2015 [place], [place]. This information is not intended to replace advice given to you by your health care provider. Make sure you discuss any questions you have with your health care provider. 1. Push fluid\ 2.Vit C 500mg  4x/day to acidify urine. HIV medication-induced ureteral stone. May have had some Ca-Ox also.

## 2013-11-01 NOTE — Interval H&P Note (Signed)
History and Physical Interval Note:  11/01/2013 5:00 PM  [name]  has presented today for surgery, with the diagnosis of RIGHT URETERAAL STONE  The various methods of treatment have been discussed with the patient and family. After consideration of risks, benefits and other options for treatment, the patient has consented to  Procedure(s): RIGHT EXTRACORPOREAL SHOCK WAVE LITHOTRIPSY (ESWL) (Right) as a surgical intervention .  The patient's history has been reviewed, patient examined, no change in status, stable for surgery.  I have reviewed the patient's chart and labs.  Questions were answered to the patient's satisfaction.     Carolan Clines I

## 2013-12-17 ENCOUNTER — Other Ambulatory Visit: Payer: Self-pay | Admitting: Gastroenterology

## 2014-01-10 ENCOUNTER — Encounter: Payer: Self-pay | Admitting: Cardiovascular Disease

## 2014-01-10 ENCOUNTER — Ambulatory Visit (INDEPENDENT_AMBULATORY_CARE_PROVIDER_SITE_OTHER): Payer: Managed Care, Other (non HMO) | Admitting: Cardiovascular Disease

## 2014-01-10 VITALS — BP 130/78 | HR 75 | Ht 69.0 in | Wt 276.0 lb

## 2014-01-10 DIAGNOSIS — E785 Hyperlipidemia, unspecified: Secondary | ICD-10-CM

## 2014-01-10 DIAGNOSIS — I10 Essential (primary) hypertension: Secondary | ICD-10-CM

## 2014-01-10 DIAGNOSIS — Z720 Tobacco use: Secondary | ICD-10-CM

## 2014-01-10 DIAGNOSIS — I2510 Atherosclerotic heart disease of native coronary artery without angina pectoris: Secondary | ICD-10-CM

## 2014-01-10 DIAGNOSIS — F17200 Nicotine dependence, unspecified, uncomplicated: Secondary | ICD-10-CM

## 2014-01-10 NOTE — Patient Instructions (Signed)
Your physician wants you to follow-up in:  12 months.  You will receive a reminder letter in the mail two months in advance. If you don't receive a letter, please call our office to schedule the follow-up appointment.   
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
## 2014-02-11 ENCOUNTER — Emergency Department (HOSPITAL_BASED_OUTPATIENT_CLINIC_OR_DEPARTMENT_OTHER)
Admission: EM | Admit: 2014-02-11 | Discharge: 2014-02-11 | Disposition: A | Discharge status: Home / Self Care | Payer: Managed Care, Other (non HMO) | Attending: Emergency Medicine | Admitting: Emergency Medicine

## 2014-02-11 ENCOUNTER — Ambulatory Visit (HOSPITAL_BASED_OUTPATIENT_CLINIC_OR_DEPARTMENT_OTHER)
Admission: RE | Admit: 2014-02-11 | Discharge: 2014-02-11 | Disposition: A | Discharge status: Home / Self Care | Payer: Managed Care, Other (non HMO) | Source: Ambulatory Visit | Attending: Emergency Medicine | Admitting: Emergency Medicine

## 2014-02-11 ENCOUNTER — Emergency Department (HOSPITAL_BASED_OUTPATIENT_CLINIC_OR_DEPARTMENT_OTHER): Payer: Managed Care, Other (non HMO)

## 2014-02-11 ENCOUNTER — Encounter (HOSPITAL_BASED_OUTPATIENT_CLINIC_OR_DEPARTMENT_OTHER): Payer: Self-pay | Admitting: Emergency Medicine

## 2014-02-11 ENCOUNTER — Other Ambulatory Visit (HOSPITAL_BASED_OUTPATIENT_CLINIC_OR_DEPARTMENT_OTHER): Payer: Self-pay | Admitting: Emergency Medicine

## 2014-02-11 DIAGNOSIS — I2510 Atherosclerotic heart disease of native coronary artery without angina pectoris: Secondary | ICD-10-CM | POA: Insufficient documentation

## 2014-02-11 DIAGNOSIS — Y9289 Other specified places as the place of occurrence of the external cause: Secondary | ICD-10-CM | POA: Insufficient documentation

## 2014-02-11 DIAGNOSIS — Z7982 Long term (current) use of aspirin: Secondary | ICD-10-CM | POA: Insufficient documentation

## 2014-02-11 DIAGNOSIS — R52 Pain, unspecified: Secondary | ICD-10-CM

## 2014-02-11 DIAGNOSIS — Z9861 Coronary angioplasty status: Secondary | ICD-10-CM | POA: Diagnosis not present

## 2014-02-11 DIAGNOSIS — S99911A Unspecified injury of right ankle, initial encounter: Secondary | ICD-10-CM | POA: Diagnosis present

## 2014-02-11 DIAGNOSIS — X58XXXA Exposure to other specified factors, initial encounter: Secondary | ICD-10-CM | POA: Insufficient documentation

## 2014-02-11 DIAGNOSIS — E785 Hyperlipidemia, unspecified: Secondary | ICD-10-CM | POA: Diagnosis not present

## 2014-02-11 DIAGNOSIS — Z21 Asymptomatic human immunodeficiency virus [HIV] infection status: Secondary | ICD-10-CM | POA: Insufficient documentation

## 2014-02-11 DIAGNOSIS — Z87442 Personal history of urinary calculi: Secondary | ICD-10-CM | POA: Insufficient documentation

## 2014-02-11 DIAGNOSIS — M25571 Pain in right ankle and joints of right foot: Secondary | ICD-10-CM | POA: Diagnosis present

## 2014-02-11 DIAGNOSIS — Z8619 Personal history of other infectious and parasitic diseases: Secondary | ICD-10-CM | POA: Insufficient documentation

## 2014-02-11 DIAGNOSIS — Z79899 Other long term (current) drug therapy: Secondary | ICD-10-CM | POA: Diagnosis not present

## 2014-02-11 DIAGNOSIS — Y990 Civilian activity done for income or pay: Secondary | ICD-10-CM | POA: Insufficient documentation

## 2014-02-11 DIAGNOSIS — Y9389 Activity, other specified: Secondary | ICD-10-CM | POA: Insufficient documentation

## 2014-02-11 DIAGNOSIS — Z720 Tobacco use: Secondary | ICD-10-CM | POA: Insufficient documentation

## 2014-02-11 DIAGNOSIS — J45909 Unspecified asthma, uncomplicated: Secondary | ICD-10-CM | POA: Insufficient documentation

## 2014-02-11 DIAGNOSIS — Z8719 Personal history of other diseases of the digestive system: Secondary | ICD-10-CM | POA: Diagnosis not present

## 2014-02-11 DIAGNOSIS — I10 Essential (primary) hypertension: Secondary | ICD-10-CM | POA: Insufficient documentation

## 2014-02-11 DIAGNOSIS — Z862 Personal history of diseases of the blood and blood-forming organs and certain disorders involving the immune mechanism: Secondary | ICD-10-CM | POA: Diagnosis not present

## 2014-02-11 DIAGNOSIS — L03115 Cellulitis of right lower limb: Secondary | ICD-10-CM | POA: Diagnosis not present

## 2014-02-11 HISTORY — DX: Unspecified viral hepatitis B without hepatic coma: B19.10

## 2014-02-11 LAB — COMPREHENSIVE METABOLIC PANEL
ALBUMIN: 4 g/dL (ref 3.5–5.2)
ALK PHOS: 91 U/L (ref 39–117)
ALT: 36 U/L (ref 0–53)
ANION GAP: 14 (ref 5–15)
AST: 24 U/L (ref 0–37)
BUN: 17 mg/dL (ref 6–23)
CALCIUM: 9.2 mg/dL (ref 8.4–10.5)
CO2: 20 mEq/L (ref 19–32)
Chloride: 106 mEq/L (ref 96–112)
Creatinine, Ser: 1.2 mg/dL (ref 0.50–1.35)
GFR calc non Af Amer: 69 mL/min — ABNORMAL LOW (ref >90)
GFR, EST AFRICAN AMERICAN: 80 mL/min — AB (ref >90)
Glucose, Bld: 95 mg/dL (ref 70–99)
Potassium: 3.9 mEq/L (ref 3.7–5.3)
Sodium: 140 mEq/L (ref 137–147)
Total Bilirubin: 0.4 mg/dL (ref 0.3–1.2)
Total Protein: 7.3 g/dL (ref 6.0–8.3)

## 2014-02-11 LAB — CBC WITH DIFFERENTIAL/PLATELET
BASOS PCT: 1 % (ref 0–1)
Basophils Absolute: 0 10*3/uL (ref 0.0–0.1)
EOS ABS: 0.1 10*3/uL (ref 0.0–0.7)
Eosinophils Relative: 2 % (ref 0–5)
HCT: 38.5 % — ABNORMAL LOW (ref 39.0–52.0)
Hemoglobin: 12.9 g/dL — ABNORMAL LOW (ref 13.0–17.0)
LYMPHS ABS: 3.2 10*3/uL (ref 0.7–4.0)
Lymphocytes Relative: 38 % (ref 12–46)
MCH: 28.9 pg (ref 26.0–34.0)
MCHC: 33.5 g/dL (ref 30.0–36.0)
MCV: 86.3 fL (ref 78.0–100.0)
MONO ABS: 1 10*3/uL (ref 0.1–1.0)
Monocytes Relative: 12 % (ref 3–12)
Neutro Abs: 4.1 10*3/uL (ref 1.7–7.7)
Neutrophils Relative %: 47 % (ref 43–77)
Platelets: 195 10*3/uL (ref 150–400)
RBC: 4.46 MIL/uL (ref 4.22–5.81)
RDW: 14.8 % (ref 11.5–15.5)
WBC: 8.5 10*3/uL (ref 4.0–10.5)

## 2014-02-11 LAB — D-DIMER, QUANTITATIVE (NOT AT ARMC)

## 2014-02-11 MED ORDER — LEVOFLOXACIN IN D5W 750 MG/150ML IV SOLN
750.0000 mg | Freq: Once | INTRAVENOUS | Status: AC
  Administered 2014-02-11: 750 mg via INTRAVENOUS
  Filled 2014-02-11: qty 150

## 2014-02-11 MED ORDER — LEVOFLOXACIN 500 MG PO TABS: ORAL_TABLET | ORAL | Status: DC
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
## 2014-02-11 NOTE — ED Notes (Signed)
Pt c/o a pallet falling on rt ankle/foot 6days ago, now has swelling and redness to RLE/ankle/foot

## 2014-03-05 ENCOUNTER — Emergency Department (HOSPITAL_BASED_OUTPATIENT_CLINIC_OR_DEPARTMENT_OTHER)
Admission: EM | Admit: 2014-03-05 | Discharge: 2014-03-05 | Disposition: A | Discharge status: Home / Self Care | Payer: Worker's Compensation | Attending: Emergency Medicine | Admitting: Emergency Medicine

## 2014-03-05 ENCOUNTER — Encounter (HOSPITAL_BASED_OUTPATIENT_CLINIC_OR_DEPARTMENT_OTHER): Payer: Self-pay

## 2014-03-05 DIAGNOSIS — Z862 Personal history of diseases of the blood and blood-forming organs and certain disorders involving the immune mechanism: Secondary | ICD-10-CM | POA: Diagnosis not present

## 2014-03-05 DIAGNOSIS — Z79899 Other long term (current) drug therapy: Secondary | ICD-10-CM | POA: Insufficient documentation

## 2014-03-05 DIAGNOSIS — Z792 Long term (current) use of antibiotics: Secondary | ICD-10-CM | POA: Diagnosis not present

## 2014-03-05 DIAGNOSIS — Z7902 Long term (current) use of antithrombotics/antiplatelets: Secondary | ICD-10-CM | POA: Insufficient documentation

## 2014-03-05 DIAGNOSIS — Z8619 Personal history of other infectious and parasitic diseases: Secondary | ICD-10-CM | POA: Insufficient documentation

## 2014-03-05 DIAGNOSIS — E785 Hyperlipidemia, unspecified: Secondary | ICD-10-CM | POA: Diagnosis not present

## 2014-03-05 DIAGNOSIS — Y990 Civilian activity done for income or pay: Secondary | ICD-10-CM | POA: Diagnosis not present

## 2014-03-05 DIAGNOSIS — Z7982 Long term (current) use of aspirin: Secondary | ICD-10-CM | POA: Insufficient documentation

## 2014-03-05 DIAGNOSIS — Y9389 Activity, other specified: Secondary | ICD-10-CM | POA: Diagnosis not present

## 2014-03-05 DIAGNOSIS — Z21 Asymptomatic human immunodeficiency virus [HIV] infection status: Secondary | ICD-10-CM | POA: Insufficient documentation

## 2014-03-05 DIAGNOSIS — Y9289 Other specified places as the place of occurrence of the external cause: Secondary | ICD-10-CM | POA: Insufficient documentation

## 2014-03-05 DIAGNOSIS — Z23 Encounter for immunization: Secondary | ICD-10-CM | POA: Diagnosis not present

## 2014-03-05 DIAGNOSIS — I10 Essential (primary) hypertension: Secondary | ICD-10-CM | POA: Diagnosis not present

## 2014-03-05 DIAGNOSIS — Z87442 Personal history of urinary calculi: Secondary | ICD-10-CM | POA: Insufficient documentation

## 2014-03-05 DIAGNOSIS — S61213A Laceration without foreign body of left middle finger without damage to nail, initial encounter: Secondary | ICD-10-CM | POA: Diagnosis present

## 2014-03-05 DIAGNOSIS — IMO0002 Reserved for concepts with insufficient information to code with codable children: Secondary | ICD-10-CM

## 2014-03-05 DIAGNOSIS — J45909 Unspecified asthma, uncomplicated: Secondary | ICD-10-CM | POA: Insufficient documentation

## 2014-03-05 DIAGNOSIS — Z9861 Coronary angioplasty status: Secondary | ICD-10-CM | POA: Insufficient documentation

## 2014-03-05 DIAGNOSIS — I2510 Atherosclerotic heart disease of native coronary artery without angina pectoris: Secondary | ICD-10-CM | POA: Diagnosis not present

## 2014-03-05 DIAGNOSIS — Z8669 Personal history of other diseases of the nervous system and sense organs: Secondary | ICD-10-CM | POA: Diagnosis not present

## 2014-03-05 DIAGNOSIS — X58XXXA Exposure to other specified factors, initial encounter: Secondary | ICD-10-CM | POA: Diagnosis not present

## 2014-03-05 DIAGNOSIS — Z720 Tobacco use: Secondary | ICD-10-CM | POA: Insufficient documentation

## 2014-03-05 MED ORDER — TETANUS-DIPHTH-ACELL PERTUSSIS 5-2.5-18.5 LF-MCG/0.5 IM SUSP
0.5000 mL | Freq: Once | INTRAMUSCULAR | Status: AC
  Administered 2014-03-05: 0.5 mL via INTRAMUSCULAR
  Filled 2014-03-05: qty 0.5

## 2014-03-05 MED ORDER — LIDOCAINE HCL 2 % IJ SOLN
INTRAMUSCULAR | Status: AC
  Administered 2014-03-05: 18:00:00
  Filled 2014-03-05: qty 20

## 2014-03-05 NOTE — ED Notes (Signed)
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

## 2014-03-05 NOTE — Discharge Instructions (Signed)
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
## 2014-06-13 ENCOUNTER — Other Ambulatory Visit: Payer: Self-pay | Admitting: Cardiovascular Disease

## 2014-06-29 ENCOUNTER — Encounter (HOSPITAL_BASED_OUTPATIENT_CLINIC_OR_DEPARTMENT_OTHER): Payer: Self-pay | Admitting: *Deleted

## 2014-06-29 ENCOUNTER — Emergency Department (HOSPITAL_BASED_OUTPATIENT_CLINIC_OR_DEPARTMENT_OTHER): Payer: Managed Care, Other (non HMO)

## 2014-06-29 ENCOUNTER — Emergency Department (HOSPITAL_BASED_OUTPATIENT_CLINIC_OR_DEPARTMENT_OTHER)
Admission: EM | Admit: 2014-06-29 | Discharge: 2014-06-30 | Disposition: A | Discharge status: Home / Self Care | Payer: Managed Care, Other (non HMO) | Attending: Emergency Medicine | Admitting: Emergency Medicine

## 2014-06-29 DIAGNOSIS — Z79899 Other long term (current) drug therapy: Secondary | ICD-10-CM | POA: Diagnosis not present

## 2014-06-29 DIAGNOSIS — Z791 Long term (current) use of non-steroidal anti-inflammatories (NSAID): Secondary | ICD-10-CM | POA: Insufficient documentation

## 2014-06-29 DIAGNOSIS — Z21 Asymptomatic human immunodeficiency virus [HIV] infection status: Secondary | ICD-10-CM | POA: Insufficient documentation

## 2014-06-29 DIAGNOSIS — Z720 Tobacco use: Secondary | ICD-10-CM | POA: Diagnosis not present

## 2014-06-29 DIAGNOSIS — I10 Essential (primary) hypertension: Secondary | ICD-10-CM | POA: Insufficient documentation

## 2014-06-29 DIAGNOSIS — N200 Calculus of kidney: Secondary | ICD-10-CM

## 2014-06-29 DIAGNOSIS — Z7982 Long term (current) use of aspirin: Secondary | ICD-10-CM | POA: Insufficient documentation

## 2014-06-29 DIAGNOSIS — Z8669 Personal history of other diseases of the nervous system and sense organs: Secondary | ICD-10-CM | POA: Diagnosis not present

## 2014-06-29 DIAGNOSIS — E785 Hyperlipidemia, unspecified: Secondary | ICD-10-CM | POA: Diagnosis not present

## 2014-06-29 DIAGNOSIS — Z9861 Coronary angioplasty status: Secondary | ICD-10-CM | POA: Insufficient documentation

## 2014-06-29 DIAGNOSIS — J45909 Unspecified asthma, uncomplicated: Secondary | ICD-10-CM | POA: Insufficient documentation

## 2014-06-29 DIAGNOSIS — Z862 Personal history of diseases of the blood and blood-forming organs and certain disorders involving the immune mechanism: Secondary | ICD-10-CM | POA: Insufficient documentation

## 2014-06-29 DIAGNOSIS — Z8619 Personal history of other infectious and parasitic diseases: Secondary | ICD-10-CM | POA: Insufficient documentation

## 2014-06-29 DIAGNOSIS — R109 Unspecified abdominal pain: Secondary | ICD-10-CM | POA: Diagnosis present

## 2014-06-29 DIAGNOSIS — I2510 Atherosclerotic heart disease of native coronary artery without angina pectoris: Secondary | ICD-10-CM | POA: Insufficient documentation

## 2014-06-29 LAB — URINALYSIS, ROUTINE W REFLEX MICROSCOPIC
BILIRUBIN URINE: NEGATIVE
Glucose, UA: NEGATIVE mg/dL
Ketones, ur: NEGATIVE mg/dL
LEUKOCYTES UA: NEGATIVE
NITRITE: NEGATIVE
PH: 6.5 (ref 5.0–8.0)
Protein, ur: NEGATIVE mg/dL
Specific Gravity, Urine: 1.016 (ref 1.005–1.030)
Urobilinogen, UA: 1 mg/dL (ref 0.0–1.0)

## 2014-06-29 LAB — URINE MICROSCOPIC-ADD ON

## 2014-06-29 MED ORDER — KETOROLAC TROMETHAMINE 30 MG/ML IJ SOLN
30.0000 mg | Freq: Once | INTRAMUSCULAR | Status: AC
  Administered 2014-06-29: 30 mg via INTRAVENOUS
  Filled 2014-06-29: qty 1

## 2014-06-29 MED ORDER — MORPHINE SULFATE 4 MG/ML IJ SOLN
4.0000 mg | Freq: Once | INTRAMUSCULAR | Status: DC
  Filled 2014-06-29: qty 1

## 2014-06-29 MED ORDER — ONDANSETRON HCL 4 MG/2ML IJ SOLN
4.0000 mg | Freq: Once | INTRAMUSCULAR | Status: AC
  Administered 2014-06-29: 4 mg via INTRAVENOUS
  Filled 2014-06-29: qty 2

## 2014-06-29 NOTE — ED Notes (Signed)
MD at bedside. 

## 2014-06-29 NOTE — ED Notes (Signed)
Right sided kidney stone.  Hx of same. N/V.
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
## 2014-06-30 MED ORDER — OXYCODONE-ACETAMINOPHEN 5-325 MG PO TABS: 1.0000 | ORAL_TABLET | Freq: Four times a day (QID) | ORAL | Status: DC | PRN

## 2014-06-30 NOTE — Discharge Instructions (Signed)
Percocet as prescribed as needed for pain.  Follow-up with urology in the next 3-4 days if your pain is not improving, and return to the ER for high fevers, worsening pain, or other new and concerning symptoms.   Kidney Stones Kidney stones (urolithiasis) are deposits that form inside your kidneys. The intense pain is caused by the stone moving through the urinary tract. When the stone moves, the ureter goes into spasm around the stone. The stone is usually passed in the urine.  CAUSES   A disorder that makes certain neck glands produce too much parathyroid hormone (primary hyperparathyroidism).  A buildup of uric acid crystals, similar to gout in your joints.  Narrowing (stricture) of the ureter.  A kidney obstruction present at birth (congenital obstruction).  Previous surgery on the kidney or ureters.  Numerous kidney infections. SYMPTOMS   Feeling sick to your stomach (nauseous).  Throwing up (vomiting).  Blood in the urine (hematuria).  Pain that usually spreads (radiates) to the groin.  Frequency or urgency of urination. DIAGNOSIS   Taking a history and physical exam.  Blood or urine tests.  CT scan.  Occasionally, an examination of the inside of the urinary bladder (cystoscopy) is performed. TREATMENT   Observation.  Increasing your fluid intake.  Extracorporeal shock wave lithotripsy--This is a noninvasive procedure that uses shock waves to break up kidney stones.  Surgery may be needed if you have severe pain or persistent obstruction. There are various surgical procedures. Most of the procedures are performed with the use of small instruments. Only small incisions are needed to accommodate these instruments, so recovery time is minimized. The size, location, and chemical composition are all important variables that will determine the proper choice of action for you. Talk to your health care provider to better understand your situation so that you will  minimize the risk of injury to yourself and your kidney.  HOME CARE INSTRUCTIONS   Drink enough water and fluids to keep your urine clear or pale yellow. This will help you to pass the stone or stone fragments.  Strain all urine through the provided strainer. Keep all particulate matter and stones for your health care provider to see. The stone causing the pain may be as small as a grain of salt. It is very important to use the strainer each and every time you pass your urine. The collection of your stone will allow your health care provider to analyze it and verify that a stone has actually passed. The stone analysis will often identify what you can do to reduce the incidence of recurrences.  Only take over-the-counter or prescription medicines for pain, discomfort, or fever as directed by your health care provider.  Make a follow-up appointment with your health care provider as directed.  Get follow-up X-rays if required. The absence of pain does not always mean that the stone has passed. It may have only stopped moving. If the urine remains completely obstructed, it can cause loss of kidney function or even complete destruction of the kidney. It is your responsibility to make sure X-rays and follow-ups are completed. Ultrasounds of the kidney can show blockages and the status of the kidney. Ultrasounds are not associated with any radiation and can be performed easily in a matter of minutes. SEEK MEDICAL CARE IF:  You experience pain that is progressive and unresponsive to any pain medicine you have been prescribed. SEEK IMMEDIATE MEDICAL CARE IF:   Pain cannot be controlled with the prescribed medicine.  You  have a fever or shaking chills.  The severity or intensity of pain increases over 18 hours and is not relieved by pain medicine.  You develop a new onset of abdominal pain.  You feel faint or pass out.  You are unable to urinate. MAKE SURE YOU:   Understand these  instructions.  Will watch your condition.  Will get help right away if you are not doing well or get worse. Document Released: 04/08/2005 Document Revised: 12/09/2012 Document Reviewed: 09/09/2012 [facility] Patient Information 2015 [place], [place]. This information is not intended to replace advice given to you by your health care provider. Make sure you discuss any questions you have with your health care provider.

## 2014-09-22 ENCOUNTER — Other Ambulatory Visit: Payer: Self-pay | Admitting: Cardiovascular Disease

## 2014-09-22 NOTE — Telephone Encounter (Signed)
Dylan Blanks, MD at 01/10/2014 9:11 AM  metoprolol tartrate (LOPRESSOR) 25 MG tablet Take 50 mg by mouth         Coronary artery disease: Stable. He remains on DAPT with ASA and Plavix. Continue beta blocker and statin

## 2014-09-23 NOTE — Telephone Encounter (Signed)
It looks like this was a reconciled med from [facility].  Unless changes made by [facility] our records indicate he should be taking lopressor 50 mg twice daily

## 2014-12-05 ENCOUNTER — Other Ambulatory Visit: Payer: Self-pay

## 2014-12-05 MED ORDER — CLOPIDOGREL BISULFATE 75 MG PO TABS: 75.0000 mg | ORAL_TABLET | Freq: Every day | ORAL | Status: DC

## 2015-02-14 ENCOUNTER — Encounter (HOSPITAL_BASED_OUTPATIENT_CLINIC_OR_DEPARTMENT_OTHER): Payer: Self-pay | Admitting: Adult Health

## 2015-02-14 ENCOUNTER — Emergency Department (HOSPITAL_BASED_OUTPATIENT_CLINIC_OR_DEPARTMENT_OTHER)
Admission: EM | Admit: 2015-02-14 | Discharge: 2015-02-14 | Disposition: A | Discharge status: Home / Self Care | Payer: Managed Care, Other (non HMO) | Attending: Emergency Medicine | Admitting: Emergency Medicine

## 2015-02-14 DIAGNOSIS — Z7901 Long term (current) use of anticoagulants: Secondary | ICD-10-CM | POA: Insufficient documentation

## 2015-02-14 DIAGNOSIS — I252 Old myocardial infarction: Secondary | ICD-10-CM | POA: Insufficient documentation

## 2015-02-14 DIAGNOSIS — Y93E5 Activity, floor mopping and cleaning: Secondary | ICD-10-CM | POA: Diagnosis not present

## 2015-02-14 DIAGNOSIS — Z862 Personal history of diseases of the blood and blood-forming organs and certain disorders involving the immune mechanism: Secondary | ICD-10-CM | POA: Insufficient documentation

## 2015-02-14 DIAGNOSIS — J45909 Unspecified asthma, uncomplicated: Secondary | ICD-10-CM | POA: Diagnosis not present

## 2015-02-14 DIAGNOSIS — Z7982 Long term (current) use of aspirin: Secondary | ICD-10-CM | POA: Diagnosis not present

## 2015-02-14 DIAGNOSIS — Z720 Tobacco use: Secondary | ICD-10-CM | POA: Diagnosis not present

## 2015-02-14 DIAGNOSIS — Y9289 Other specified places as the place of occurrence of the external cause: Secondary | ICD-10-CM | POA: Insufficient documentation

## 2015-02-14 DIAGNOSIS — Z9861 Coronary angioplasty status: Secondary | ICD-10-CM | POA: Insufficient documentation

## 2015-02-14 DIAGNOSIS — S0501XA Injury of conjunctiva and corneal abrasion without foreign body, right eye, initial encounter: Secondary | ICD-10-CM | POA: Insufficient documentation

## 2015-02-14 DIAGNOSIS — I2510 Atherosclerotic heart disease of native coronary artery without angina pectoris: Secondary | ICD-10-CM | POA: Diagnosis not present

## 2015-02-14 DIAGNOSIS — I10 Essential (primary) hypertension: Secondary | ICD-10-CM | POA: Insufficient documentation

## 2015-02-14 DIAGNOSIS — B20 Human immunodeficiency virus [HIV] disease: Secondary | ICD-10-CM | POA: Insufficient documentation

## 2015-02-14 DIAGNOSIS — Y998 Other external cause status: Secondary | ICD-10-CM | POA: Diagnosis not present

## 2015-02-14 DIAGNOSIS — E785 Hyperlipidemia, unspecified: Secondary | ICD-10-CM | POA: Insufficient documentation

## 2015-02-14 DIAGNOSIS — X58XXXA Exposure to other specified factors, initial encounter: Secondary | ICD-10-CM | POA: Insufficient documentation

## 2015-02-14 DIAGNOSIS — Z8669 Personal history of other diseases of the nervous system and sense organs: Secondary | ICD-10-CM | POA: Diagnosis not present

## 2015-02-14 DIAGNOSIS — Z8619 Personal history of other infectious and parasitic diseases: Secondary | ICD-10-CM | POA: Diagnosis not present

## 2015-02-14 DIAGNOSIS — Z87442 Personal history of urinary calculi: Secondary | ICD-10-CM | POA: Diagnosis not present

## 2015-02-14 DIAGNOSIS — H5711 Ocular pain, right eye: Secondary | ICD-10-CM | POA: Diagnosis present

## 2015-02-14 HISTORY — DX: Acute myocardial infarction, unspecified: I21.9

## 2015-02-14 MED ORDER — TETRACAINE HCL 0.5 % OP SOLN
OPHTHALMIC | Status: AC
  Filled 2015-02-14: qty 2

## 2015-02-14 MED ORDER — TETRACAINE HCL 0.5 % OP SOLN: 2.0000 [drp] | Freq: Once | OPHTHALMIC | Status: AC

## 2015-02-14 MED ORDER — HYDROCODONE-ACETAMINOPHEN 5-325 MG PO TABS
1.0000 | ORAL_TABLET | Freq: Once | ORAL | Status: AC
  Administered 2015-02-14: 1 via ORAL
  Filled 2015-02-14: qty 1

## 2015-02-14 MED ORDER — FLUORESCEIN SODIUM 1 MG OP STRP: 1.0000 | ORAL_STRIP | Freq: Once | OPHTHALMIC | Status: AC

## 2015-02-14 MED ORDER — HYDROCODONE-ACETAMINOPHEN 5-325 MG PO TABS: 1.0000 | ORAL_TABLET | Freq: Four times a day (QID) | ORAL | Status: DC | PRN

## 2015-02-14 MED ORDER — ERYTHROMYCIN 5 MG/GM OP OINT
TOPICAL_OINTMENT | OPHTHALMIC | Status: AC
  Filled 2015-02-14: qty 3.5

## 2015-02-14 MED ORDER — FLUORESCEIN SODIUM 1 MG OP STRP
1.0000 | ORAL_STRIP | Freq: Once | OPHTHALMIC | Status: AC
  Administered 2015-02-14: 1 via OPHTHALMIC

## 2015-02-14 MED ORDER — TETRACAINE HCL 0.5 % OP SOLN
1.0000 [drp] | Freq: Once | OPHTHALMIC | Status: AC
  Administered 2015-02-14: 1 [drp] via OPHTHALMIC

## 2015-02-14 MED ORDER — FLUORESCEIN SODIUM 1 MG OP STRP
ORAL_STRIP | OPHTHALMIC | Status: AC
  Filled 2015-02-14: qty 2

## 2015-02-14 MED ORDER — ERYTHROMYCIN 5 MG/GM OP OINT
TOPICAL_OINTMENT | Freq: Once | OPHTHALMIC | Status: AC
  Administered 2015-02-14: 1 via OPHTHALMIC

## 2015-02-14 MED ORDER — ERYTHROMYCIN 5 MG/GM OP OINT: TOPICAL_OINTMENT | OPHTHALMIC | Status: DC

## 2015-02-14 NOTE — ED Notes (Signed)
Presents with right eye pain, redness, itching and watering after sweeping and getting dust in eyes. Bilateral eye redness, unable to open right eye with out pain pain rated 8/10

## 2015-02-14 NOTE — Discharge Instructions (Signed)
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
## 2015-03-03 ENCOUNTER — Other Ambulatory Visit: Payer: Self-pay | Admitting: Cardiovascular Disease
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
## 2015-03-22 ENCOUNTER — Telehealth: Payer: Self-pay | Admitting: *Deleted

## 2015-03-22 ENCOUNTER — Other Ambulatory Visit: Payer: Self-pay | Admitting: Cardiovascular Disease

## 2015-03-22 ENCOUNTER — Encounter: Payer: Self-pay | Admitting: Cardiovascular Disease

## 2015-03-22 ENCOUNTER — Ambulatory Visit (INDEPENDENT_AMBULATORY_CARE_PROVIDER_SITE_OTHER): Payer: Managed Care, Other (non HMO) | Admitting: Cardiovascular Disease

## 2015-03-22 VITALS — BP 110/80 | HR 77 | Ht 68.0 in | Wt 274.8 lb

## 2015-03-22 DIAGNOSIS — Z720 Tobacco use: Secondary | ICD-10-CM

## 2015-03-22 DIAGNOSIS — E785 Hyperlipidemia, unspecified: Secondary | ICD-10-CM

## 2015-03-22 DIAGNOSIS — I2510 Atherosclerotic heart disease of native coronary artery without angina pectoris: Secondary | ICD-10-CM | POA: Diagnosis not present

## 2015-03-22 MED ORDER — PRAVASTATIN SODIUM 40 MG PO TABS: 40.0000 mg | ORAL_TABLET | Freq: Every evening | ORAL | Status: DC

## 2015-03-22 MED ORDER — VERAPAMIL HCL ER 120 MG PO TBCR: 120.0000 mg | EXTENDED_RELEASE_TABLET | Freq: Every day | ORAL | Status: DC

## 2015-03-22 NOTE — Telephone Encounter (Signed)
Follow up ° ° ° ° ° °Returning a call to the nurse °

## 2015-03-22 NOTE — Telephone Encounter (Signed)
Left message to call back  

## 2015-03-22 NOTE — Telephone Encounter (Signed)
ETT was ordered at office visit today.  Dr. Angelena Form wanted pt to have exercise myoview.  Appt made for myoview at same time ETT was scheduled.  I placed call to pt to notify him of change and to go over instructions for myoview.  Left message to call back

## 2015-03-22 NOTE — Patient Instructions (Signed)
Medication Instructions:  Resume Verapamil SR 120 mg by mouth daily. Resume Pravastatin 40 mg by mouth daily  Labwork: none  Testing/Procedures: Your physician has requested that you have an exercise tolerance test. For further information please visit [email or web address]. Please also follow instruction sheet, as given.    Follow-Up: Your physician wants you to follow-up in: 12 months.  You will receive a reminder letter in the mail two months in advance. If you don't receive a letter, please call our office to schedule the follow-up appointment.   Any Other Special Instructions Will Be Listed Below (If Applicable).     If you need a refill on your cardiac medications before your next appointment, please call your pharmacy.

## 2015-03-22 NOTE — Telephone Encounter (Signed)
Pt rtn call to Fraser Din- can reached at [phone]

## 2015-03-22 NOTE — Telephone Encounter (Signed)
Pt notified of change to exercise myoview.  I verbally went over instructions with pt.

## 2015-05-10 ENCOUNTER — Telehealth (HOSPITAL_COMMUNITY): Payer: Self-pay | Admitting: *Deleted

## 2015-05-10 NOTE — Telephone Encounter (Signed)
Left message on voicemail in reference to upcoming appointment scheduled for 05/12/15. Phone number given for a call back so details instructions can be given. Hubbard Robinson, RN

## 2015-05-12 ENCOUNTER — Encounter: Payer: Self-pay | Admitting: Physician Assistant

## 2015-05-12 ENCOUNTER — Ambulatory Visit (HOSPITAL_COMMUNITY): Payer: Managed Care, Other (non HMO) | Attending: Cardiovascular Disease

## 2015-05-12 DIAGNOSIS — I2510 Atherosclerotic heart disease of native coronary artery without angina pectoris: Secondary | ICD-10-CM | POA: Diagnosis present

## 2015-05-12 DIAGNOSIS — R9439 Abnormal result of other cardiovascular function study: Secondary | ICD-10-CM | POA: Diagnosis not present

## 2015-05-12 DIAGNOSIS — J45909 Unspecified asthma, uncomplicated: Secondary | ICD-10-CM | POA: Insufficient documentation

## 2015-05-12 DIAGNOSIS — I10 Essential (primary) hypertension: Secondary | ICD-10-CM | POA: Insufficient documentation

## 2015-05-12 MED ORDER — TECHNETIUM TC 99M SESTAMIBI GENERIC - CARDIOLITE
10.2000 | Freq: Once | INTRAVENOUS | Status: AC | PRN
  Administered 2015-05-12: 10 via INTRAVENOUS

## 2015-05-15 ENCOUNTER — Ambulatory Visit (HOSPITAL_COMMUNITY): Payer: Managed Care, Other (non HMO) | Attending: Internal Medicine

## 2015-05-15 DIAGNOSIS — I2510 Atherosclerotic heart disease of native coronary artery without angina pectoris: Secondary | ICD-10-CM | POA: Diagnosis not present

## 2015-05-15 LAB — MYOCARDIAL PERFUSION IMAGING
CHL CUP NUCLEAR SDS: 1
CHL CUP NUCLEAR SRS: 5
CHL CUP NUCLEAR SSS: 6
CHL CUP RESTING HR STRESS: 83 {beats}/min
LV sys vol: 55 mL
LVDIAVOL: 132 mL
Peak HR: 96 {beats}/min
RATE: 0.32
TID: 0.93

## 2015-05-15 MED ORDER — TECHNETIUM TC 99M SESTAMIBI GENERIC - CARDIOLITE
32.9000 | Freq: Once | INTRAVENOUS | Status: AC | PRN
  Administered 2015-05-15: 32.9 via INTRAVENOUS

## 2015-05-15 MED ORDER — REGADENOSON 0.4 MG/5ML IV SOLN
0.4000 mg | Freq: Once | INTRAVENOUS | Status: AC
  Administered 2015-05-15: 0.4 mg via INTRAVENOUS

## 2015-06-01 ENCOUNTER — Other Ambulatory Visit: Payer: Self-pay | Admitting: Cardiovascular Disease

## 2015-07-22 ENCOUNTER — Ambulatory Visit (HOSPITAL_COMMUNITY)
Admission: RE | Admit: 2015-07-22 | Discharge: 2015-07-22 | Disposition: A | Discharge status: Home / Self Care | Payer: Managed Care, Other (non HMO) | Source: Ambulatory Visit | Attending: Family Medicine | Admitting: Family Medicine

## 2015-07-22 ENCOUNTER — Other Ambulatory Visit: Payer: Self-pay | Admitting: Family Medicine

## 2015-07-22 DIAGNOSIS — R609 Edema, unspecified: Secondary | ICD-10-CM

## 2015-07-22 NOTE — Progress Notes (Signed)
VASCULAR LAB PRELIMINARY  PRELIMINARY  PRELIMINARY  PRELIMINARY  Right lower extremity venous duplex completed.    Preliminary report:  There is no DVT or SVT noted in the right lower extremity.  There is interstitial fluid noted throughout the right calf.   Cloris Flippo, RVT 07/22/2015, 2:31 PM

## 2015-09-29 ENCOUNTER — Ambulatory Visit (HOSPITAL_COMMUNITY)
Admission: RE | Admit: 2015-09-29 | Discharge: 2015-09-29 | Disposition: A | Discharge status: Home / Self Care | Payer: Managed Care, Other (non HMO) | Source: Ambulatory Visit | Attending: Internal Medicine | Admitting: Internal Medicine

## 2015-09-29 ENCOUNTER — Other Ambulatory Visit (HOSPITAL_COMMUNITY): Payer: Self-pay | Admitting: Internal Medicine

## 2015-09-29 DIAGNOSIS — I2510 Atherosclerotic heart disease of native coronary artery without angina pectoris: Secondary | ICD-10-CM | POA: Insufficient documentation

## 2015-09-29 DIAGNOSIS — G4730 Sleep apnea, unspecified: Secondary | ICD-10-CM | POA: Insufficient documentation

## 2015-09-29 DIAGNOSIS — E785 Hyperlipidemia, unspecified: Secondary | ICD-10-CM | POA: Diagnosis not present

## 2015-09-29 DIAGNOSIS — M7989 Other specified soft tissue disorders: Secondary | ICD-10-CM | POA: Insufficient documentation

## 2015-09-29 DIAGNOSIS — M79602 Pain in left arm: Secondary | ICD-10-CM

## 2015-09-29 DIAGNOSIS — I10 Essential (primary) hypertension: Secondary | ICD-10-CM | POA: Insufficient documentation

## 2015-09-29 DIAGNOSIS — M79605 Pain in left leg: Secondary | ICD-10-CM | POA: Diagnosis not present

## 2015-09-29 DIAGNOSIS — Z720 Tobacco use: Secondary | ICD-10-CM | POA: Diagnosis not present

## 2015-09-29 DIAGNOSIS — B20 Human immunodeficiency virus [HIV] disease: Secondary | ICD-10-CM | POA: Diagnosis not present

## 2015-09-29 NOTE — Progress Notes (Signed)
Preliminary results by tech - Left Lower Ext. Venous Duplex Completed. Negative for deep vein thrombosis in the left leg. Oda Cogan, BS, RDMS, RVT

## 2015-09-30 ENCOUNTER — Emergency Department (HOSPITAL_COMMUNITY): Payer: Managed Care, Other (non HMO)

## 2015-09-30 ENCOUNTER — Encounter (HOSPITAL_COMMUNITY): Payer: Self-pay | Admitting: Emergency Medicine

## 2015-09-30 ENCOUNTER — Observation Stay (HOSPITAL_COMMUNITY)
Admission: EM | Admit: 2015-09-30 | Discharge: 2015-10-01 | Disposition: A | Discharge status: Home / Self Care | Payer: Managed Care, Other (non HMO) | Attending: Neurosurgery | Admitting: Neurosurgery

## 2015-09-30 DIAGNOSIS — S065XAA Traumatic subdural hemorrhage with loss of consciousness status unknown, initial encounter: Secondary | ICD-10-CM

## 2015-09-30 DIAGNOSIS — S61412A Laceration without foreign body of left hand, initial encounter: Secondary | ICD-10-CM | POA: Insufficient documentation

## 2015-09-30 DIAGNOSIS — Z7902 Long term (current) use of antithrombotics/antiplatelets: Secondary | ICD-10-CM | POA: Insufficient documentation

## 2015-09-30 DIAGNOSIS — I10 Essential (primary) hypertension: Secondary | ICD-10-CM | POA: Diagnosis not present

## 2015-09-30 DIAGNOSIS — I2510 Atherosclerotic heart disease of native coronary artery without angina pectoris: Secondary | ICD-10-CM | POA: Diagnosis not present

## 2015-09-30 DIAGNOSIS — I252 Old myocardial infarction: Secondary | ICD-10-CM | POA: Insufficient documentation

## 2015-09-30 DIAGNOSIS — S065X0A Traumatic subdural hemorrhage without loss of consciousness, initial encounter: Secondary | ICD-10-CM | POA: Diagnosis present

## 2015-09-30 DIAGNOSIS — Z8679 Personal history of other diseases of the circulatory system: Secondary | ICD-10-CM | POA: Diagnosis present

## 2015-09-30 DIAGNOSIS — Z955 Presence of coronary angioplasty implant and graft: Secondary | ICD-10-CM | POA: Diagnosis not present

## 2015-09-30 DIAGNOSIS — G4730 Sleep apnea, unspecified: Secondary | ICD-10-CM | POA: Insufficient documentation

## 2015-09-30 DIAGNOSIS — S065X9A Traumatic subdural hemorrhage with loss of consciousness of unspecified duration, initial encounter: Secondary | ICD-10-CM

## 2015-09-30 DIAGNOSIS — Y998 Other external cause status: Secondary | ICD-10-CM | POA: Diagnosis not present

## 2015-09-30 DIAGNOSIS — S51822A Laceration with foreign body of left forearm, initial encounter: Secondary | ICD-10-CM | POA: Insufficient documentation

## 2015-09-30 DIAGNOSIS — S0181XA Laceration without foreign body of other part of head, initial encounter: Secondary | ICD-10-CM | POA: Diagnosis not present

## 2015-09-30 DIAGNOSIS — Y9289 Other specified places as the place of occurrence of the external cause: Secondary | ICD-10-CM | POA: Diagnosis not present

## 2015-09-30 DIAGNOSIS — Z21 Asymptomatic human immunodeficiency virus [HIV] infection status: Secondary | ICD-10-CM | POA: Insufficient documentation

## 2015-09-30 DIAGNOSIS — E785 Hyperlipidemia, unspecified: Secondary | ICD-10-CM | POA: Diagnosis not present

## 2015-09-30 DIAGNOSIS — S50812A Abrasion of left forearm, initial encounter: Secondary | ICD-10-CM | POA: Insufficient documentation

## 2015-09-30 DIAGNOSIS — J45909 Unspecified asthma, uncomplicated: Secondary | ICD-10-CM | POA: Insufficient documentation

## 2015-09-30 DIAGNOSIS — Z7982 Long term (current) use of aspirin: Secondary | ICD-10-CM | POA: Insufficient documentation

## 2015-09-30 DIAGNOSIS — Y9389 Activity, other specified: Secondary | ICD-10-CM | POA: Insufficient documentation

## 2015-09-30 DIAGNOSIS — T07XXXA Unspecified multiple injuries, initial encounter: Secondary | ICD-10-CM

## 2015-09-30 DIAGNOSIS — F17210 Nicotine dependence, cigarettes, uncomplicated: Secondary | ICD-10-CM | POA: Diagnosis not present

## 2015-09-30 LAB — BASIC METABOLIC PANEL
Anion gap: 7 (ref 5–15)
BUN: 19 mg/dL (ref 6–20)
CO2: 22 mmol/L (ref 22–32)
Calcium: 8.6 mg/dL — ABNORMAL LOW (ref 8.9–10.3)
Chloride: 107 mmol/L (ref 101–111)
Creatinine, Ser: 1.27 mg/dL — ABNORMAL HIGH (ref 0.61–1.24)
GFR calc Af Amer: >60 mL/min (ref >60)
GFR calc non Af Amer: >60 mL/min (ref >60)
Glucose, Bld: 276 mg/dL — ABNORMAL HIGH (ref 65–99)
Potassium: 3.8 mmol/L (ref 3.5–5.1)
Sodium: 136 mmol/L (ref 135–145)

## 2015-09-30 LAB — CBC WITH DIFFERENTIAL/PLATELET
Basophils Absolute: 0.1 10*3/uL (ref 0.0–0.1)
Basophils Relative: 1 %
Eosinophils Absolute: 0.1 10*3/uL (ref 0.0–0.7)
Eosinophils Relative: 1 %
HCT: 37.8 % — ABNORMAL LOW (ref 39.0–52.0)
Hemoglobin: 12.5 g/dL — ABNORMAL LOW (ref 13.0–17.0)
Lymphocytes Relative: 32 %
Lymphs Abs: 4.3 10*3/uL — ABNORMAL HIGH (ref 0.7–4.0)
MCH: 28.1 pg (ref 26.0–34.0)
MCHC: 33.1 g/dL (ref 30.0–36.0)
MCV: 84.9 fL (ref 78.0–100.0)
Monocytes Absolute: 1 10*3/uL (ref 0.1–1.0)
Monocytes Relative: 8 %
Neutro Abs: 7.7 10*3/uL (ref 1.7–7.7)
Neutrophils Relative %: 58 %
Platelets: 205 10*3/uL (ref 150–400)
RBC: 4.45 MIL/uL (ref 4.22–5.81)
RDW: 13.5 % (ref 11.5–15.5)
WBC: 13.2 10*3/uL — ABNORMAL HIGH (ref 4.0–10.5)

## 2015-09-30 MED ORDER — TOPIRAMATE 25 MG PO TABS
200.0000 mg | ORAL_TABLET | Freq: Every day | ORAL | Status: DC
  Administered 2015-09-30: 200 mg via ORAL
  Filled 2015-09-30: qty 8

## 2015-09-30 MED ORDER — BACLOFEN 10 MG PO TABS: 10.0000 mg | ORAL_TABLET | Freq: Three times a day (TID) | ORAL | Status: DC | PRN

## 2015-09-30 MED ORDER — HYDROCODONE-ACETAMINOPHEN 5-325 MG PO TABS
1.0000 | ORAL_TABLET | ORAL | Status: DC | PRN
  Administered 2015-10-01: 1 via ORAL
  Filled 2015-09-30: qty 1

## 2015-09-30 MED ORDER — VERAPAMIL HCL ER 120 MG PO TBCR
120.0000 mg | EXTENDED_RELEASE_TABLET | Freq: Every day | ORAL | Status: DC
  Administered 2015-09-30: 120 mg via ORAL
  Filled 2015-09-30: qty 1

## 2015-09-30 MED ORDER — PRAVASTATIN SODIUM 40 MG PO TABS
40.0000 mg | ORAL_TABLET | Freq: Every evening | ORAL | Status: DC
  Administered 2015-09-30: 40 mg via ORAL
  Filled 2015-09-30: qty 1

## 2015-09-30 MED ORDER — POTASSIUM CHLORIDE 2 MEQ/ML IV SOLN
INTRAVENOUS | Status: DC
  Administered 2015-09-30: 20:00:00 via INTRAVENOUS
  Filled 2015-09-30 (×3): qty 1000

## 2015-09-30 MED ORDER — ONDANSETRON HCL 4 MG PO TABS: 4.0000 mg | ORAL_TABLET | Freq: Four times a day (QID) | ORAL | Status: DC | PRN

## 2015-09-30 MED ORDER — FUROSEMIDE 20 MG PO TABS
80.0000 mg | ORAL_TABLET | Freq: Two times a day (BID) | ORAL | Status: DC
  Administered 2015-10-01: 80 mg via ORAL
  Filled 2015-09-30 (×2): qty 4

## 2015-09-30 MED ORDER — METOPROLOL TARTRATE 25 MG PO TABS
50.0000 mg | ORAL_TABLET | Freq: Two times a day (BID) | ORAL | Status: DC
  Administered 2015-09-30 – 2015-10-01 (×2): 50 mg via ORAL
  Filled 2015-09-30 (×2): qty 2

## 2015-09-30 MED ORDER — OXYCODONE-ACETAMINOPHEN 5-325 MG PO TABS
2.0000 | ORAL_TABLET | Freq: Once | ORAL | Status: AC
  Administered 2015-09-30: 2 via ORAL
  Filled 2015-09-30: qty 2

## 2015-09-30 MED ORDER — ONDANSETRON 4 MG PO TBDP
4.0000 mg | ORAL_TABLET | Freq: Once | ORAL | Status: AC
  Administered 2015-09-30: 4 mg via ORAL
  Filled 2015-09-30: qty 1

## 2015-09-30 MED ORDER — ONDANSETRON HCL 4 MG/2ML IJ SOLN: 4.0000 mg | Freq: Four times a day (QID) | INTRAMUSCULAR | Status: DC | PRN

## 2015-09-30 MED ORDER — LIDOCAINE-EPINEPHRINE 1 %-1:100000 IJ SOLN
10.0000 mL | Freq: Once | INTRAMUSCULAR | Status: AC
  Administered 2015-09-30: 10 mL
  Filled 2015-09-30: qty 1

## 2015-09-30 MED ORDER — NITROGLYCERIN 0.4 MG SL SUBL: 0.4000 mg | SUBLINGUAL_TABLET | SUBLINGUAL | Status: DC | PRN

## 2015-09-30 MED ORDER — RALTEGRAVIR POTASSIUM 400 MG PO TABS
400.0000 mg | ORAL_TABLET | Freq: Every morning | ORAL | Status: DC
  Administered 2015-10-01: 400 mg via ORAL
  Filled 2015-09-30: qty 1

## 2015-09-30 MED ORDER — TETANUS-DIPHTH-ACELL PERTUSSIS 5-2.5-18.5 LF-MCG/0.5 IM SUSP
0.5000 mL | Freq: Once | INTRAMUSCULAR | Status: AC
  Administered 2015-09-30: 0.5 mL via INTRAMUSCULAR
  Filled 2015-09-30: qty 0.5

## 2015-09-30 MED ORDER — ACETAMINOPHEN 325 MG PO TABS: 650.0000 mg | ORAL_TABLET | ORAL | Status: DC | PRN

## 2015-09-30 MED ORDER — ZOLPIDEM TARTRATE 5 MG PO TABS: 10.0000 mg | ORAL_TABLET | Freq: Every evening | ORAL | Status: DC | PRN

## 2015-09-30 MED ORDER — HYDROCODONE-ACETAMINOPHEN 5-325 MG PO TABS
2.0000 | ORAL_TABLET | ORAL | Status: DC | PRN
  Administered 2015-09-30 – 2015-10-01 (×2): 2 via ORAL
  Filled 2015-09-30 (×2): qty 2

## 2015-09-30 MED ORDER — EMTRICITABINE-TENOFOVIR AF 200-25 MG PO TABS
1.0000 | ORAL_TABLET | Freq: Every day | ORAL | Status: DC
  Administered 2015-10-01: 1 via ORAL
  Filled 2015-09-30 (×2): qty 1

## 2015-09-30 MED ORDER — SERTRALINE HCL 50 MG PO TABS
50.0000 mg | ORAL_TABLET | Freq: Every day | ORAL | Status: DC
  Administered 2015-10-01: 50 mg via ORAL
  Filled 2015-09-30: qty 1

## 2015-09-30 MED ORDER — SODIUM CHLORIDE 0.9 % IV BOLUS (SEPSIS)
1000.0000 mL | Freq: Once | INTRAVENOUS | Status: AC
  Administered 2015-09-30: 1000 mL via INTRAVENOUS
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
## 2015-09-30 NOTE — ED Notes (Signed)
Patient transported to CT 

## 2015-09-30 NOTE — ED Notes (Signed)
Wrapped patient arm clean with saline

## 2015-09-30 NOTE — ED Notes (Signed)
Pt stated he felt like he was going to pass out, this rn went in to assess and he was sitting on the stool, and diaphoretic. Assisted patient back into the bed and administered 4 mg of zofran po per vo er md, patient now resting in bed and states he feels better
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
## 2015-09-30 NOTE — ED Provider Notes (Signed)
LACERATION REPAIR Performed by: Renold Genta Authorized by: Jeannett Senior A Consent: Verbal consent obtained. Risks and benefits: risks, benefits and alternatives were discussed Consent given by: patient Patient identity confirmed: provided demographic data Prepped and Draped in normal sterile fashion Wound explored  Laceration Location: left forearm  Laceration Length:8 cm  No Foreign Bodies seen or palpated  Anesthesia: local infiltration  Local anesthetic: lidocaine 1% w epinephrine  Anesthetic total: 3 ml  Irrigation method: syringe Amount of cleaning: standard  Skin closure: prolene 4.0  Number of sutures: 4  Technique: simple interrupted Foreign body removed, 1 piece of glass  Patient tolerance: Patient tolerated the procedure well with no immediate complications.  LACERATION REPAIR Performed by: Renold Genta Authorized by: Jeannett Senior A Consent: Verbal consent obtained. Risks and benefits: risks, benefits and alternatives were discussed Consent given by: patient Patient identity confirmed: provided demographic data Prepped and Draped in normal sterile fashion Wound explored  Laceration Location: left hand  Laceration Length: 4cm  No Foreign Bodies seen or palpated  Anesthesia: local infiltration  Local anesthetic: lidocaine 1% w epinephrine  Anesthetic total: 4 ml  Irrigation method: syringe Amount of cleaning: standard  Skin closure: vicril rapid 5.0, prolene 4.0   Number of sutures: 1 deep, 7 superficial  Technique: simple interrupted  Patient tolerance: Patient tolerated the procedure well with no immediate complications.     Jeannett Senior, PA-C 09/30/15 [place], MD 10/13/15 2141

## 2015-10-01 ENCOUNTER — Observation Stay (HOSPITAL_COMMUNITY): Payer: Managed Care, Other (non HMO)

## 2015-10-01 MED ORDER — CEPHALEXIN 500 MG PO CAPS
500.0000 mg | ORAL_CAPSULE | Freq: Four times a day (QID) | ORAL | Status: DC
Start: 2015-10-01 — End: 2016-09-03

## 2015-10-01 MED ORDER — HYDROCODONE-ACETAMINOPHEN 5-325 MG PO TABS: 1.0000 | ORAL_TABLET | Freq: Four times a day (QID) | ORAL | Status: DC | PRN

## 2015-10-01 NOTE — Progress Notes (Addendum)
Patient has moderate edema of lower extremity, current order for IV fluid appears to be exacerbating this condition which patient has undergone recent therapy for, he is also currently taking potasium pills. I stopped the IV therapy after patient informed me of these relevant facts and at patients request as he was starting to haave some discomfort and anxiety about his edema becoming worse.

## 2015-10-01 NOTE — Progress Notes (Signed)
Discharge orders received.  Discharge instructions and follow-up appointments reviewed with the patient.  VSS upon discharge.  IV removed and education complete.  Walked out to main entrance.  Transported out via wheelchair.  Cori Razor, RN

## 2015-10-01 NOTE — Progress Notes (Signed)
Patient feels there is some small glass fragments in back of left hand adjacent to thumb.
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
## 2015-10-01 NOTE — Discharge Instructions (Signed)
.  kc
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
## 2016-03-22 ENCOUNTER — Other Ambulatory Visit: Payer: Self-pay | Admitting: Cardiovascular Disease

## 2016-04-18 ENCOUNTER — Other Ambulatory Visit: Payer: Self-pay | Admitting: Cardiovascular Disease

## 2016-04-18 NOTE — Telephone Encounter (Signed)
metoprolol tartrate (LOPRESSOR) 25 MG tablet  Medication  Date: 03/22/2016 Department: [address] Office Ordering/Authorizing: [name], MD  Order Providers   Prescribing Provider Encounter Provider  [name], MD [name], MD  Medication Detail    Disp Refills Start End   metoprolol tartrate (LOPRESSOR) 25 MG tablet 120 tablet 0 03/22/2016    Sig: TAKE 2 TABLETS BY MOUTH TWICE A DAY   Notes to Pharmacy: Please call our office to schedule an yearly appointment before anymore refills. [phone]. Thank you 1st attempt   E-Prescribing Status: Receipt confirmed by pharmacy (03/22/2016 9:14 AM EST)   Pharmacy   [id number] [place], [place]

## 2016-04-29 ENCOUNTER — Other Ambulatory Visit: Payer: Self-pay | Admitting: Cardiovascular Disease

## 2016-04-30 ENCOUNTER — Other Ambulatory Visit: Payer: Self-pay | Admitting: Cardiovascular Disease

## 2016-04-30 MED ORDER — METOPROLOL TARTRATE 25 MG PO TABS: 50.0000 mg | ORAL_TABLET | Freq: Two times a day (BID) | ORAL | 0 refills | Status: DC

## 2016-06-16 ENCOUNTER — Other Ambulatory Visit: Payer: Self-pay | Admitting: Cardiovascular Disease

## 2016-06-17 NOTE — Telephone Encounter (Signed)
Medication  clopidogrel (PLAVIX) 75 MG tablet [[id number]]  clopidogrel (PLAVIX) 75 MG tablet [[id number]] DISCONTINUED  Order Details  Dose: 75 mg Route: Oral Frequency: Daily  Dispense Quantity:  90 tablet Refills:  2 Fills remaining:  --        Sig: Take 1 tablet (75 mg total) by mouth daily.       Discontinue Date:  10/01/2015 1004 Discontinue User:  Ashok Pall, MD Discontinue Reason:  Stop Taking at Discharge  Written Date:  06/01/15 Expiration Date:  05/31/16    Start Date:  06/01/15 End Date:  10/01/15         Ordering Provider:  [name], MD DEA #:  [id number] NPI:  [id number]   Authorizing Provider:  [name], MD DEA #:  [id number] NPI:  [id number]   Ordering User:  Juventino Slovak, CMA            Original Order:  clopidogrel (PLAVIX) 75 MG tablet [[id number]]    Pharmacy:  [place], [place] [facility] #:  --    Pharmacy Comments:  --

## 2016-06-18 ENCOUNTER — Telehealth: Payer: Self-pay | Admitting: *Deleted

## 2016-06-18 NOTE — Telephone Encounter (Signed)
Pharmacy calling to verify if patient is still taking Plavix or not. Would like a call back.  Rx was sent in as approved with discontinued in comment section.

## 2016-06-18 NOTE — Telephone Encounter (Signed)
Pharmacy notified that patient has not taken clopidogrel (plavix) since it was discontinued in July of 2017. Patient has not been seen by Dr. Angelena Form since November 2016. Pharmacy states that the order has been cancelled.

## 2016-06-18 NOTE — Telephone Encounter (Signed)
The discharge note from June 2017 indicates Plavix was stopped. I have not seen him since November 2016. cdm

## 2016-06-20 ENCOUNTER — Telehealth: Payer: Self-pay | Admitting: *Deleted

## 2016-06-20 NOTE — Telephone Encounter (Signed)
Pt is past due for follow up with Dr. Angelena Form. I placed call to pt to schedule this appointment.  Left message to call back.

## 2016-06-21 NOTE — Telephone Encounter (Signed)
Appt has been scheduled for 08/14/16

## 2016-07-10 DIAGNOSIS — E119 Type 2 diabetes mellitus without complications: Secondary | ICD-10-CM | POA: Insufficient documentation

## 2016-07-26 ENCOUNTER — Encounter: Payer: Self-pay | Admitting: *Deleted

## 2016-08-14 ENCOUNTER — Encounter: Payer: Self-pay | Admitting: Cardiovascular Disease

## 2016-08-14 ENCOUNTER — Encounter (INDEPENDENT_AMBULATORY_CARE_PROVIDER_SITE_OTHER): Payer: Self-pay

## 2016-08-14 ENCOUNTER — Ambulatory Visit (INDEPENDENT_AMBULATORY_CARE_PROVIDER_SITE_OTHER): Payer: 59 | Admitting: Cardiovascular Disease

## 2016-08-14 VITALS — BP 142/90 | HR 88 | Ht 68.0 in | Wt 281.4 lb

## 2016-08-14 DIAGNOSIS — I2510 Atherosclerotic heart disease of native coronary artery without angina pectoris: Secondary | ICD-10-CM

## 2016-08-14 DIAGNOSIS — E7800 Pure hypercholesterolemia, unspecified: Secondary | ICD-10-CM | POA: Diagnosis not present

## 2016-08-14 MED ORDER — VERAPAMIL HCL ER 120 MG PO TBCR: 120.0000 mg | EXTENDED_RELEASE_TABLET | Freq: Every day | ORAL | 3 refills | Status: DC

## 2016-08-14 MED ORDER — PRAVASTATIN SODIUM 40 MG PO TABS: 40.0000 mg | ORAL_TABLET | Freq: Every evening | ORAL | 3 refills | Status: DC
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
## 2016-08-14 NOTE — Patient Instructions (Signed)
Your physician recommends that you continue on your current medications as directed. Please refer to the Current Medication list given to you today.  Your physician wants you to follow-up in: YEAR WITH DR MCALHANY  You will receive a reminder letter in the mail two months in advance. If you don't receive a letter, please call our office to schedule the follow-up appointment. 

## 2016-09-02 ENCOUNTER — Encounter (HOSPITAL_BASED_OUTPATIENT_CLINIC_OR_DEPARTMENT_OTHER): Payer: Self-pay

## 2016-09-02 ENCOUNTER — Inpatient Hospital Stay (HOSPITAL_BASED_OUTPATIENT_CLINIC_OR_DEPARTMENT_OTHER)
Admission: EM | Admit: 2016-09-02 | Discharge: 2016-09-19 | DRG: 233 | Disposition: A | Discharge status: Home / Self Care | Payer: 59 | Attending: Cardiothoracic Surgery | Admitting: Cardiothoracic Surgery

## 2016-09-02 ENCOUNTER — Emergency Department (HOSPITAL_BASED_OUTPATIENT_CLINIC_OR_DEPARTMENT_OTHER): Payer: 59

## 2016-09-02 DIAGNOSIS — I252 Old myocardial infarction: Secondary | ICD-10-CM

## 2016-09-02 DIAGNOSIS — G4733 Obstructive sleep apnea (adult) (pediatric): Secondary | ICD-10-CM | POA: Diagnosis present

## 2016-09-02 DIAGNOSIS — Z01810 Encounter for preprocedural cardiovascular examination: Secondary | ICD-10-CM | POA: Diagnosis not present

## 2016-09-02 DIAGNOSIS — I25110 Atherosclerotic heart disease of native coronary artery with unstable angina pectoris: Secondary | ICD-10-CM | POA: Diagnosis present

## 2016-09-02 DIAGNOSIS — E042 Nontoxic multinodular goiter: Secondary | ICD-10-CM | POA: Diagnosis present

## 2016-09-02 DIAGNOSIS — J45909 Unspecified asthma, uncomplicated: Secondary | ICD-10-CM | POA: Diagnosis present

## 2016-09-02 DIAGNOSIS — I9789 Other postprocedural complications and disorders of the circulatory system, not elsewhere classified: Secondary | ICD-10-CM | POA: Diagnosis not present

## 2016-09-02 DIAGNOSIS — I4892 Unspecified atrial flutter: Secondary | ICD-10-CM | POA: Diagnosis not present

## 2016-09-02 DIAGNOSIS — R7302 Impaired glucose tolerance (oral): Secondary | ICD-10-CM | POA: Diagnosis not present

## 2016-09-02 DIAGNOSIS — Z720 Tobacco use: Secondary | ICD-10-CM | POA: Diagnosis not present

## 2016-09-02 DIAGNOSIS — E8770 Fluid overload, unspecified: Secondary | ICD-10-CM | POA: Diagnosis not present

## 2016-09-02 DIAGNOSIS — Y838 Other surgical procedures as the cause of abnormal reaction of the patient, or of later complication, without mention of misadventure at the time of the procedure: Secondary | ICD-10-CM | POA: Diagnosis not present

## 2016-09-02 DIAGNOSIS — I2510 Atherosclerotic heart disease of native coronary artery without angina pectoris: Secondary | ICD-10-CM | POA: Diagnosis not present

## 2016-09-02 DIAGNOSIS — Z7982 Long term (current) use of aspirin: Secondary | ICD-10-CM | POA: Diagnosis not present

## 2016-09-02 DIAGNOSIS — R0602 Shortness of breath: Secondary | ICD-10-CM

## 2016-09-02 DIAGNOSIS — I10 Essential (primary) hypertension: Secondary | ICD-10-CM | POA: Diagnosis not present

## 2016-09-02 DIAGNOSIS — E782 Mixed hyperlipidemia: Secondary | ICD-10-CM | POA: Diagnosis not present

## 2016-09-02 DIAGNOSIS — T82855A Stenosis of coronary artery stent, initial encounter: Secondary | ICD-10-CM | POA: Diagnosis present

## 2016-09-02 DIAGNOSIS — R072 Precordial pain: Secondary | ICD-10-CM | POA: Diagnosis not present

## 2016-09-02 DIAGNOSIS — R000 Tachycardia, unspecified: Secondary | ICD-10-CM | POA: Diagnosis not present

## 2016-09-02 DIAGNOSIS — I484 Atypical atrial flutter: Secondary | ICD-10-CM | POA: Diagnosis not present

## 2016-09-02 DIAGNOSIS — L0390 Cellulitis, unspecified: Secondary | ICD-10-CM | POA: Diagnosis not present

## 2016-09-02 DIAGNOSIS — Z9989 Dependence on other enabling machines and devices: Secondary | ICD-10-CM | POA: Diagnosis not present

## 2016-09-02 DIAGNOSIS — I2582 Chronic total occlusion of coronary artery: Secondary | ICD-10-CM | POA: Diagnosis present

## 2016-09-02 DIAGNOSIS — N179 Acute kidney failure, unspecified: Secondary | ICD-10-CM | POA: Diagnosis not present

## 2016-09-02 DIAGNOSIS — Z8679 Personal history of other diseases of the circulatory system: Secondary | ICD-10-CM

## 2016-09-02 DIAGNOSIS — J9811 Atelectasis: Secondary | ICD-10-CM | POA: Diagnosis not present

## 2016-09-02 DIAGNOSIS — J9600 Acute respiratory failure, unspecified whether with hypoxia or hypercapnia: Secondary | ICD-10-CM | POA: Diagnosis not present

## 2016-09-02 DIAGNOSIS — Z79899 Other long term (current) drug therapy: Secondary | ICD-10-CM | POA: Diagnosis not present

## 2016-09-02 DIAGNOSIS — K5190 Ulcerative colitis, unspecified, without complications: Secondary | ICD-10-CM | POA: Diagnosis present

## 2016-09-02 DIAGNOSIS — E119 Type 2 diabetes mellitus without complications: Secondary | ICD-10-CM | POA: Diagnosis present

## 2016-09-02 DIAGNOSIS — Z951 Presence of aortocoronary bypass graft: Secondary | ICD-10-CM

## 2016-09-02 DIAGNOSIS — J939 Pneumothorax, unspecified: Secondary | ICD-10-CM

## 2016-09-02 DIAGNOSIS — J9589 Other postprocedural complications and disorders of respiratory system, not elsewhere classified: Secondary | ICD-10-CM | POA: Diagnosis not present

## 2016-09-02 DIAGNOSIS — B20 Human immunodeficiency virus [HIV] disease: Secondary | ICD-10-CM | POA: Diagnosis not present

## 2016-09-02 DIAGNOSIS — Z9689 Presence of other specified functional implants: Secondary | ICD-10-CM

## 2016-09-02 DIAGNOSIS — Z955 Presence of coronary angioplasty implant and graft: Secondary | ICD-10-CM

## 2016-09-02 DIAGNOSIS — F17210 Nicotine dependence, cigarettes, uncomplicated: Secondary | ICD-10-CM | POA: Diagnosis present

## 2016-09-02 DIAGNOSIS — E876 Hypokalemia: Secondary | ICD-10-CM | POA: Diagnosis present

## 2016-09-02 DIAGNOSIS — Z21 Asymptomatic human immunodeficiency virus [HIV] infection status: Secondary | ICD-10-CM | POA: Diagnosis present

## 2016-09-02 DIAGNOSIS — I209 Angina pectoris, unspecified: Secondary | ICD-10-CM | POA: Diagnosis not present

## 2016-09-02 DIAGNOSIS — E785 Hyperlipidemia, unspecified: Secondary | ICD-10-CM | POA: Diagnosis present

## 2016-09-02 DIAGNOSIS — I959 Hypotension, unspecified: Secondary | ICD-10-CM | POA: Diagnosis not present

## 2016-09-02 DIAGNOSIS — I214 Non-ST elevation (NSTEMI) myocardial infarction: Principal | ICD-10-CM

## 2016-09-02 DIAGNOSIS — D62 Acute posthemorrhagic anemia: Secondary | ICD-10-CM | POA: Diagnosis not present

## 2016-09-02 DIAGNOSIS — E784 Other hyperlipidemia: Secondary | ICD-10-CM | POA: Diagnosis not present

## 2016-09-02 DIAGNOSIS — Z9861 Coronary angioplasty status: Secondary | ICD-10-CM | POA: Diagnosis not present

## 2016-09-02 DIAGNOSIS — Z87442 Personal history of urinary calculi: Secondary | ICD-10-CM

## 2016-09-02 DIAGNOSIS — Z419 Encounter for procedure for purposes other than remedying health state, unspecified: Secondary | ICD-10-CM

## 2016-09-02 DIAGNOSIS — Z7984 Long term (current) use of oral hypoglycemic drugs: Secondary | ICD-10-CM

## 2016-09-02 DIAGNOSIS — E041 Nontoxic single thyroid nodule: Secondary | ICD-10-CM

## 2016-09-02 DIAGNOSIS — I25119 Atherosclerotic heart disease of native coronary artery with unspecified angina pectoris: Secondary | ICD-10-CM | POA: Diagnosis not present

## 2016-09-02 DIAGNOSIS — I809 Phlebitis and thrombophlebitis of unspecified site: Secondary | ICD-10-CM | POA: Diagnosis not present

## 2016-09-02 DIAGNOSIS — I480 Paroxysmal atrial fibrillation: Secondary | ICD-10-CM | POA: Diagnosis present

## 2016-09-02 LAB — COMPREHENSIVE METABOLIC PANEL
ALK PHOS: 132 U/L — AB (ref 38–126)
ALT: 59 U/L (ref 17–63)
AST: 38 U/L (ref 15–41)
Albumin: 4 g/dL (ref 3.5–5.0)
Anion gap: 7 (ref 5–15)
BUN: 14 mg/dL (ref 6–20)
CALCIUM: 9.4 mg/dL (ref 8.9–10.3)
CO2: 24 mmol/L (ref 22–32)
CREATININE: 1.01 mg/dL (ref 0.61–1.24)
Chloride: 104 mmol/L (ref 101–111)
GFR calc non Af Amer: >60 mL/min (ref >60)
Glucose, Bld: 258 mg/dL — ABNORMAL HIGH (ref 65–99)
Potassium: 3.9 mmol/L (ref 3.5–5.1)
SODIUM: 135 mmol/L (ref 135–145)
Total Bilirubin: 0.4 mg/dL (ref 0.3–1.2)
Total Protein: 6.9 g/dL (ref 6.5–8.1)

## 2016-09-02 LAB — CBC WITH DIFFERENTIAL/PLATELET
Basophils Absolute: 0 10*3/uL (ref 0.0–0.1)
Basophils Relative: 0 %
EOS ABS: 0.1 10*3/uL (ref 0.0–0.7)
Eosinophils Relative: 2 %
HCT: 44.8 % (ref 39.0–52.0)
HEMOGLOBIN: 15.4 g/dL (ref 13.0–17.0)
LYMPHS ABS: 2.9 10*3/uL (ref 0.7–4.0)
LYMPHS PCT: 42 %
MCH: 29 pg (ref 26.0–34.0)
MCHC: 34.4 g/dL (ref 30.0–36.0)
MCV: 84.4 fL (ref 78.0–100.0)
Monocytes Absolute: 0.8 10*3/uL (ref 0.1–1.0)
Monocytes Relative: 12 %
NEUTROS ABS: 3 10*3/uL (ref 1.7–7.7)
NEUTROS PCT: 44 %
Platelets: 180 10*3/uL (ref 150–400)
RBC: 5.31 MIL/uL (ref 4.22–5.81)
RDW: 13.2 % (ref 11.5–15.5)
WBC: 6.9 10*3/uL (ref 4.0–10.5)

## 2016-09-02 LAB — TROPONIN I: Troponin I: 0.53 ng/mL (ref <0.03)

## 2016-09-02 LAB — LIPASE, BLOOD: LIPASE: 33 U/L (ref 11–51)

## 2016-09-02 MED ORDER — IOPAMIDOL (ISOVUE-370) INJECTION 76%
100.0000 mL | Freq: Once | INTRAVENOUS | Status: AC | PRN
  Administered 2016-09-03: 100 mL via INTRAVENOUS
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
## 2016-09-02 NOTE — ED Triage Notes (Signed)
CP x 2 hours

## 2016-09-03 ENCOUNTER — Encounter (HOSPITAL_COMMUNITY)
Admission: EM | Disposition: A | Discharge status: Home / Self Care | Payer: Self-pay | Source: Home / Self Care | Attending: Cardiothoracic Surgery

## 2016-09-03 ENCOUNTER — Encounter (HOSPITAL_COMMUNITY): Payer: Self-pay | Admitting: Certified Registered Nurse Anesthetist

## 2016-09-03 DIAGNOSIS — I10 Essential (primary) hypertension: Secondary | ICD-10-CM

## 2016-09-03 DIAGNOSIS — I214 Non-ST elevation (NSTEMI) myocardial infarction: Principal | ICD-10-CM

## 2016-09-03 DIAGNOSIS — I25110 Atherosclerotic heart disease of native coronary artery with unstable angina pectoris: Secondary | ICD-10-CM

## 2016-09-03 DIAGNOSIS — E785 Hyperlipidemia, unspecified: Secondary | ICD-10-CM

## 2016-09-03 HISTORY — PX: LEFT HEART CATH AND CORONARY ANGIOGRAPHY: CATH118249

## 2016-09-03 LAB — BASIC METABOLIC PANEL
ANION GAP: 10 (ref 5–15)
BUN: 12 mg/dL (ref 6–20)
CALCIUM: 9.2 mg/dL (ref 8.9–10.3)
CO2: 23 mmol/L (ref 22–32)
Chloride: 104 mmol/L (ref 101–111)
Creatinine, Ser: 0.85 mg/dL (ref 0.61–1.24)
GFR calc Af Amer: >60 mL/min (ref >60)
GFR calc non Af Amer: >60 mL/min (ref >60)
GLUCOSE: 156 mg/dL — AB (ref 65–99)
Potassium: 3.5 mmol/L (ref 3.5–5.1)
Sodium: 137 mmol/L (ref 135–145)

## 2016-09-03 LAB — MRSA PCR SCREENING: MRSA by PCR: NEGATIVE

## 2016-09-03 LAB — PROTIME-INR
INR: 0.92
Prothrombin Time: 12.4 seconds (ref 11.4–15.2)

## 2016-09-03 LAB — HEPARIN LEVEL (UNFRACTIONATED): Heparin Unfractionated: 0.1 IU/mL — ABNORMAL LOW (ref 0.30–0.70)

## 2016-09-03 LAB — POCT ACTIVATED CLOTTING TIME: Activated Clotting Time: 109 seconds

## 2016-09-03 LAB — TROPONIN I: TROPONIN I: 0.88 ng/mL — AB (ref <0.03)

## 2016-09-03 SURGERY — LEFT HEART CATH AND CORONARY ANGIOGRAPHY
Anesthesia: LOCAL

## 2016-09-03 MED ORDER — FENTANYL CITRATE (PF) 100 MCG/2ML IJ SOLN
INTRAMUSCULAR | Status: AC
  Filled 2016-09-03: qty 2

## 2016-09-03 MED ORDER — VERAPAMIL HCL 2.5 MG/ML IV SOLN
INTRAVENOUS | Status: AC
  Filled 2016-09-03: qty 2

## 2016-09-03 MED ORDER — HEPARIN BOLUS VIA INFUSION
3000.0000 [IU] | Freq: Once | INTRAVENOUS | Status: AC
  Administered 2016-09-03: 3000 [IU] via INTRAVENOUS
  Filled 2016-09-03: qty 3000

## 2016-09-03 MED ORDER — IOPAMIDOL (ISOVUE-370) INJECTION 76%
INTRAVENOUS | Status: DC | PRN
  Administered 2016-09-03: 80 mL via INTRA_ARTERIAL

## 2016-09-03 MED ORDER — SODIUM CHLORIDE 0.9 % WEIGHT BASED INFUSION: 1.0000 mL/kg/h | INTRAVENOUS | Status: AC

## 2016-09-03 MED ORDER — MIDAZOLAM HCL 2 MG/2ML IJ SOLN
INTRAMUSCULAR | Status: DC | PRN
  Administered 2016-09-03: 2 mg via INTRAVENOUS

## 2016-09-03 MED ORDER — FUROSEMIDE 80 MG PO TABS
80.0000 mg | ORAL_TABLET | Freq: Two times a day (BID) | ORAL | Status: DC
  Administered 2016-09-04 – 2016-09-08 (×10): 80 mg via ORAL
  Filled 2016-09-03 (×11): qty 1

## 2016-09-03 MED ORDER — SODIUM CHLORIDE 0.9 % WEIGHT BASED INFUSION
1.0000 mL/kg/h | INTRAVENOUS | Status: DC
  Administered 2016-09-03 (×2): 1 mL/kg/h via INTRAVENOUS

## 2016-09-03 MED ORDER — ACETAMINOPHEN 325 MG PO TABS: 650.0000 mg | ORAL_TABLET | ORAL | Status: DC | PRN

## 2016-09-03 MED ORDER — EMTRICITABINE-TENOFOVIR AF 200-25 MG PO TABS
1.0000 | ORAL_TABLET | Freq: Every day | ORAL | Status: DC
  Administered 2016-09-03 – 2016-09-08 (×6): 1 via ORAL
  Filled 2016-09-03 (×7): qty 1

## 2016-09-03 MED ORDER — SODIUM CHLORIDE 0.9% FLUSH
3.0000 mL | Freq: Two times a day (BID) | INTRAVENOUS | Status: DC
  Administered 2016-09-04 – 2016-09-08 (×3): 3 mL via INTRAVENOUS

## 2016-09-03 MED ORDER — SODIUM CHLORIDE 0.9 % IV SOLN
250.0000 mL | INTRAVENOUS | Status: DC | PRN
  Administered 2016-09-09: 14:00:00 via INTRAVENOUS

## 2016-09-03 MED ORDER — SODIUM CHLORIDE 0.9% FLUSH: 3.0000 mL | INTRAVENOUS | Status: DC | PRN

## 2016-09-03 MED ORDER — ASPIRIN 81 MG PO CHEW
324.0000 mg | CHEWABLE_TABLET | ORAL | Status: AC
  Administered 2016-09-03: 324 mg via ORAL
  Filled 2016-09-03: qty 4

## 2016-09-03 MED ORDER — NITROGLYCERIN 0.4 MG SL SUBL: 0.4000 mg | SUBLINGUAL_TABLET | SUBLINGUAL | Status: DC | PRN

## 2016-09-03 MED ORDER — HEPARIN (PORCINE) IN NACL 100-0.45 UNIT/ML-% IJ SOLN
2000.0000 [IU]/h | INTRAMUSCULAR | Status: DC
  Administered 2016-09-04: 1850 [IU]/h via INTRAVENOUS
  Administered 2016-09-04: 2200 [IU]/h via INTRAVENOUS
  Administered 2016-09-05 – 2016-09-06 (×3): 2450 [IU]/h via INTRAVENOUS
  Administered 2016-09-06: 2250 [IU]/h via INTRAVENOUS
  Administered 2016-09-07: 2000 [IU]/h via INTRAVENOUS
  Administered 2016-09-07: 1850 [IU]/h via INTRAVENOUS
  Administered 2016-09-08: 2000 [IU]/h via INTRAVENOUS
  Filled 2016-09-03 (×10): qty 250

## 2016-09-03 MED ORDER — ONDANSETRON HCL 4 MG/2ML IJ SOLN: 4.0000 mg | Freq: Four times a day (QID) | INTRAMUSCULAR | Status: DC | PRN

## 2016-09-03 MED ORDER — SODIUM CHLORIDE 0.9 % WEIGHT BASED INFUSION
3.0000 mL/kg/h | INTRAVENOUS | Status: AC
  Administered 2016-09-03: 3 mL/kg/h via INTRAVENOUS

## 2016-09-03 MED ORDER — FENTANYL CITRATE (PF) 100 MCG/2ML IJ SOLN
INTRAMUSCULAR | Status: DC | PRN
  Administered 2016-09-03: 25 ug via INTRAVENOUS

## 2016-09-03 MED ORDER — MIDAZOLAM HCL 2 MG/2ML IJ SOLN
INTRAMUSCULAR | Status: AC
  Filled 2016-09-03: qty 2

## 2016-09-03 MED ORDER — METOPROLOL TARTRATE 50 MG PO TABS
50.0000 mg | ORAL_TABLET | Freq: Two times a day (BID) | ORAL | Status: DC
  Administered 2016-09-03 – 2016-09-08 (×12): 50 mg via ORAL
  Filled 2016-09-03 (×12): qty 1

## 2016-09-03 MED ORDER — IOPAMIDOL (ISOVUE-370) INJECTION 76%
INTRAVENOUS | Status: AC
  Filled 2016-09-03: qty 100

## 2016-09-03 MED ORDER — CEPHALEXIN 500 MG PO CAPS: 500.0000 mg | ORAL_CAPSULE | Freq: Four times a day (QID) | ORAL | Status: DC

## 2016-09-03 MED ORDER — BACLOFEN 10 MG PO TABS: 10.0000 mg | ORAL_TABLET | ORAL | Status: DC | PRN

## 2016-09-03 MED ORDER — TOPIRAMATE 100 MG PO TABS: 200.0000 mg | ORAL_TABLET | Freq: Every day | ORAL | Status: DC

## 2016-09-03 MED ORDER — LIDOCAINE HCL (PF) 1 % IJ SOLN
INTRAMUSCULAR | Status: AC
  Filled 2016-09-03: qty 30

## 2016-09-03 MED ORDER — HEPARIN (PORCINE) IN NACL 2-0.9 UNIT/ML-% IJ SOLN
INTRAMUSCULAR | Status: AC | PRN
  Administered 2016-09-03: 1000 mL

## 2016-09-03 MED ORDER — LIDOCAINE HCL (PF) 1 % IJ SOLN
INTRAMUSCULAR | Status: DC | PRN
  Administered 2016-09-03: 2 mL
  Administered 2016-09-03: 17 mL

## 2016-09-03 MED ORDER — ASPIRIN 81 MG PO CHEW: 81.0000 mg | CHEWABLE_TABLET | ORAL | Status: AC

## 2016-09-03 MED ORDER — HEPARIN SODIUM (PORCINE) 1000 UNIT/ML IJ SOLN
INTRAMUSCULAR | Status: AC
  Filled 2016-09-03: qty 1

## 2016-09-03 MED ORDER — ASPIRIN EC 81 MG PO TBEC: 81.0000 mg | DELAYED_RELEASE_TABLET | Freq: Every day | ORAL | Status: DC

## 2016-09-03 MED ORDER — ASPIRIN EC 81 MG PO TBEC
81.0000 mg | DELAYED_RELEASE_TABLET | Freq: Every day | ORAL | Status: DC
  Administered 2016-09-04 – 2016-09-08 (×5): 81 mg via ORAL
  Filled 2016-09-03 (×5): qty 1

## 2016-09-03 MED ORDER — HEPARIN BOLUS VIA INFUSION
4000.0000 [IU] | Freq: Once | INTRAVENOUS | Status: AC
Start: 2016-09-03 — End: 2016-09-03
  Administered 2016-09-03: 4000 [IU] via INTRAVENOUS
  Filled 2016-09-03: qty 4000

## 2016-09-03 MED ORDER — DOLUTEGRAVIR SODIUM 50 MG PO TABS
50.0000 mg | ORAL_TABLET | Freq: Every day | ORAL | Status: DC
  Administered 2016-09-03 – 2016-09-08 (×6): 50 mg via ORAL
  Filled 2016-09-03 (×7): qty 1

## 2016-09-03 MED ORDER — SERTRALINE HCL 50 MG PO TABS
50.0000 mg | ORAL_TABLET | Freq: Every day | ORAL | Status: DC
  Administered 2016-09-03 – 2016-09-08 (×6): 50 mg via ORAL
  Filled 2016-09-03 (×6): qty 1

## 2016-09-03 MED ORDER — ZOLPIDEM TARTRATE 5 MG PO TABS
10.0000 mg | ORAL_TABLET | Freq: Every day | ORAL | Status: DC
  Administered 2016-09-03 – 2016-09-08 (×7): 10 mg via ORAL
  Filled 2016-09-03 (×7): qty 2

## 2016-09-03 MED ORDER — ASPIRIN 300 MG RE SUPP: 300.0000 mg | RECTAL | Status: AC

## 2016-09-03 MED ORDER — HEPARIN (PORCINE) IN NACL 100-0.45 UNIT/ML-% IJ SOLN
1850.0000 [IU]/h | INTRAMUSCULAR | Status: DC
  Administered 2016-09-03: 1500 [IU]/h via INTRAVENOUS
  Administered 2016-09-03: 1850 [IU]/h via INTRAVENOUS
  Filled 2016-09-03 (×2): qty 250

## 2016-09-03 MED ORDER — VERAPAMIL HCL ER 120 MG PO TBCR
120.0000 mg | EXTENDED_RELEASE_TABLET | Freq: Every day | ORAL | Status: DC
  Administered 2016-09-03 – 2016-09-08 (×7): 120 mg via ORAL
  Filled 2016-09-03 (×7): qty 1

## 2016-09-03 MED ORDER — RALTEGRAVIR POTASSIUM 400 MG PO TABS: 400.0000 mg | ORAL_TABLET | Freq: Two times a day (BID) | ORAL | Status: DC

## 2016-09-03 MED ORDER — HEPARIN (PORCINE) IN NACL 2-0.9 UNIT/ML-% IJ SOLN
INTRAMUSCULAR | Status: AC
  Filled 2016-09-03: qty 1000

## 2016-09-03 MED ORDER — PRAVASTATIN SODIUM 40 MG PO TABS
40.0000 mg | ORAL_TABLET | Freq: Every evening | ORAL | Status: DC
  Administered 2016-09-04 – 2016-09-08 (×5): 40 mg via ORAL
  Filled 2016-09-03 (×6): qty 1

## 2016-09-03 SURGICAL SUPPLY — 14 items
CATH INFINITI 5FR MULTPACK ANG (CATHETERS) ×2 IMPLANT
COVER PRB 48X5XTLSCP FOLD TPE (BAG) ×1 IMPLANT
COVER PROBE 5X48 (BAG) ×1
GLIDESHEATH SLEND SS 6F .021 (SHEATH) ×2 IMPLANT
GUIDEWIRE 3MM J TIP .035 145 (WIRE) ×2 IMPLANT
GUIDEWIRE INQWIRE 1.5J.035X260 (WIRE) ×1 IMPLANT
INQWIRE 1.5J .035X260CM (WIRE) ×2
KIT HEART LEFT (KITS) ×2 IMPLANT
PACK CARDIAC CATHETERIZATION (CUSTOM PROCEDURE TRAY) ×2 IMPLANT
SHEATH PINNACLE 5F 10CM (SHEATH) ×2 IMPLANT
SYR CONTROL 10ML ANGIOGRAPHIC (SYRINGE) ×2 IMPLANT
SYR MEDRAD MARK V 150ML (SYRINGE) ×2 IMPLANT
TRANSDUCER W/STOPCOCK (MISCELLANEOUS) ×2 IMPLANT
TUBING CIL FLEX 10 FLL-RA (TUBING) ×2 IMPLANT

## 2016-09-03 NOTE — Progress Notes (Signed)
ANTICOAGULATION CONSULT NOTE  Pharmacy Consult for Heparin Indication: NSTEMI  No Known Allergies  Patient Measurements: Height: '5\' 9"'$  (175.3 cm) Weight: 280 lb 14.4 oz (127.4 kg) IBW/kg (Calculated) : 70.7 Heparin Dosing Weight: 100 kg  Vital Signs: Temp: 98.3 F (36.8 C) (05/15 0758) Temp Source: Oral (05/15 0758) BP: 141/87 (05/15 0758) Pulse Rate: 83 (05/15 0758)  Labs:  Recent Labs  09/02/16 2235 09/03/16 0553 09/03/16 0948  HGB 15.4  --   --   HCT 44.8  --   --   PLT 180  --   --   LABPROT  --  12.4  --   INR  --  0.92  --   HEPARINUNFRC  --   --  0.10*  CREATININE 1.01 0.85  --   TROPONINI 0.53* 0.88*  --     Estimated Creatinine Clearance: 132.8 mL/min (by C-G formula based on SCr of 0.85 mg/dL).   Assessment: 51 y.o. male with chest pain/NSTEMI on IV heparin. Heparin level is subtherapeutic at 0.1 on 1500 units/hr. Spoke with RN who reports no problems with infusion. No bleeding noted, CBC was normal yesterday. Plan is for cath tomorrow.  Goal of Therapy:  Heparin level 0.3-0.7 units/ml Monitor platelets by anticoagulation protocol: Yes   Plan:  Heparin 3000 unit IV bolus then increase rate to 1850 units/hr 6 hr heparin level and CBC Daily heparin level and CBC Monitor for s/sx of bleeding   Renold Genta, PharmD, BCPS Clinical Pharmacist Phone for today - [place] - [phone] 09/03/2016 10:51 AM

## 2016-09-03 NOTE — Progress Notes (Signed)
Inpatient Diabetes Program Recommendations  AACE/ADA: New Consensus Statement on Inpatient Glycemic Control (2015)  Target Ranges:  Prepandial:   less than 140 mg/dL      Peak postprandial:   less than 180 mg/dL (1-2 hours)      Critically ill patients:  140 - 180 mg/dL    Review of Glycemic Control  Diabetes history: DM 2 Outpatient Diabetes medications: Amaryl 4 mg Daily, Metformin 1000 mg BID Current orders for Inpatient glycemic control: None  Inpatient Diabetes Program Recommendations:    Patient has a history of DM. Please order CBGs and Novolog Sensitive Correction Q4 hours While NPO while inpatient.  Thanks,  Tama Headings RN, MSN, [facility] Inpatient Diabetes Coordinator Team Pager [phone] (8a-5p)
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
## 2016-09-03 NOTE — Progress Notes (Signed)
Progress Note  Patient Name: [name] Date of Encounter: 09/03/2016  Primary Cardiologist: Angelena Form  Subjective   No chest pain.   Inpatient Medications    Scheduled Meds: . [START ON 09/04/2016] aspirin EC  81 mg Oral Daily  . dolutegravir  50 mg Oral Daily  . emtricitabine-tenofovir AF  1 tablet Oral Daily  . furosemide  80 mg Oral BID  . metoprolol tartrate  50 mg Oral BID  . pravastatin  40 mg Oral QPM  . sertraline  50 mg Oral Daily  . verapamil  120 mg Oral QHS  . zolpidem  10 mg Oral QHS   Continuous Infusions: . sodium chloride 1 mL/kg/hr (09/03/16 1110)  . heparin 1,850 Units/hr (09/03/16 1110)   PRN Meds: acetaminophen, nitroGLYCERIN, ondansetron (ZOFRAN) IV   Vital Signs    Vitals:   09/03/16 0012 09/03/16 0026 09/03/16 0112 09/03/16 0758  BP: (!) 143/87 139/86 (!) 156/92 (!) 141/87  Pulse: 88 84 91 83  Resp: 19 (!) 22    Temp:   98.4 F (36.9 C) 98.3 F (36.8 C)  TempSrc:   Oral Oral  SpO2: 98% 97% 99% 97%  Weight:   280 lb 14.4 oz (127.4 kg)   Height:   '5\' 9"'$  (1.753 m)     Intake/Output Summary (Last 24 hours) at 09/03/16 1221 Last data filed at 09/03/16 1110  Gross per 24 hour  Intake           593.23 ml  Output              450 ml  Net           143.23 ml   Filed Weights   09/03/16 0112  Weight: 280 lb 14.4 oz (127.4 kg)    Telemetry    SR - Personally Reviewed  ECG    SR - Personally Reviewed  Physical Exam   General: Well developed, well nourished, male appearing in no acute distress. Head: Normocephalic, atraumatic.  Neck: Supple without bruits, JVD. Lungs:  Resp regular and unlabored, CTA. Heart: RRR, S1, S2, no S3, S4, or murmur; no rub. Abdomen: Soft, non-tender, non-distended with normoactive bowel sounds. No hepatomegaly. No rebound/guarding. No obvious abdominal masses. Extremities: No clubbing, cyanosis, edema. Distal pedal pulses are 2+ bilaterally. Neuro: Alert and oriented X 3. Moves all extremities  spontaneously. Psych: Normal affect.  Labs    Chemistry Recent Labs Lab 09/02/16 2235 09/03/16 0553  NA 135 137  K 3.9 3.5  CL 104 104  CO2 24 23  GLUCOSE 258* 156*  BUN 14 12  CREATININE 1.01 0.85  CALCIUM 9.4 9.2  PROT 6.9  --   ALBUMIN 4.0  --   AST 38  --   ALT 59  --   ALKPHOS 132*  --   BILITOT 0.4  --   GFRNONAA >60 >60  GFRAA >60 >60  ANIONGAP 7 10     Hematology Recent Labs Lab 09/02/16 2235  WBC 6.9  RBC 5.31  HGB 15.4  HCT 44.8  MCV 84.4  MCH 29.0  MCHC 34.4  RDW 13.2  PLT 180    Cardiac Enzymes Recent Labs Lab 09/02/16 2235 09/03/16 0553  TROPONINI 0.53* 0.88*   No results for input(s): TROPIPOC in the last 168 hours.   BNPNo results for input(s): BNP, PROBNP in the last 168 hours.   DDimer No results for input(s): DDIMER in the last 168 hours.    Radiology    Dg  Chest 2 View  Result Date: 09/02/2016 CLINICAL DATA:  Right-sided chest pain, onset this evening. EXAM: CHEST  2 VIEW COMPARISON:  Radiographs 02/17/2011 FINDINGS: The cardiomediastinal contours are normal. The lungs are clear. Pulmonary vasculature is normal. No consolidation, pleural effusion, or pneumothorax. No acute osseous abnormalities are seen. IMPRESSION: No acute abnormality. Electronically Signed   By: Jeb Levering M.D.   On: 09/02/2016 22:43   Ct Angio Chest Aorta W And/or Wo Contrast  Result Date: 09/03/2016 CLINICAL DATA:  Right-sided chest pain radiating to the upper back. EXAM: CT ANGIOGRAPHY CHEST WITH CONTRAST TECHNIQUE: Multidetector CT imaging of the chest was performed using the standard protocol during bolus administration of intravenous contrast. Multiplanar CT image reconstructions and MIPs were obtained to evaluate the vascular anatomy. CONTRAST:  100 cc Isovue 370 IV COMPARISON:  None. FINDINGS: Cardiovascular: No large central pulmonary embolus, aortic aneurysm or dissection. Normal size cardiac chambers. Coronary arteriosclerosis with coronary  arterial stent in the circumflex. No pericardial effusion. Mediastinum/Nodes: Heterogeneously enhancing thyroid isthmic and left-sided nodule measuring 2.1 x 3.4 cm on series 4, image 8. No adenopathy. Esophagus is unremarkable. The trachea and mainstem bronchi are patent. Lungs/Pleura: Bibasilar dependent atelectasis. No pneumonic consolidation, effusion or pneumothorax. Upper Abdomen: Hepatic steatosis. No acute abnormality in the upper abdomen. Small splenule is seen at the splenic hilum. The visualized adrenal glands and pancreas are nonacute. Musculoskeletal: No chest wall abnormality. No acute or significant osseous findings. Review of the MIP images confirms the above findings. IMPRESSION: 1. No aortic aneurysm, dissection nor large central pulmonary embolus. 2. No active pulmonary disease. 3. Coronary arteriosclerosis coronary stent in the circumflex. 4. Hepatic steatosis. 5. Heterogeneous enhancing cystic nodule measuring 3.4 x 2.1 cm in the thyroid isthmus and left lobe of the thyroid gland. Additional characterization with ultrasound is suggested. This follows ACR consensus guidelines: Managing Incidental Thyroid Nodules Detected on Imaging: White Paper of the ACR Incidental Thyroid Findings Committee. J Am Coll Radiol 2015; 12:143-150. Electronically Signed   By: Ashley Royalty M.D.   On: 09/03/2016 00:33    Cardiac Studies   N/A  Patient Profile     51 y.o. male with PMH of CAD s/p DES Lcx (2012), HIV, HL, tobacco abuse, OSA, UC, and DM who presented with chest pain.   Assessment & Plan    1. CAD/ ACS - NSTEMI: Last cath in 2012 with DES placed to the LCx, had diffuse disease in other vessels. Stress test in 1/17 with no ischemia. Presented with new onset chest pain after eating last evening. Symptoms are different than what he experienced with previous stent placement. Trop 0.88 thus far. Placed on IV heparin. No further chest pain.  -- planned for cardiac cath today. -- on BB, statin,  ASA  2. HIV: home medications continued  3. HL: on statin  4. Tobacco use: cessation encouraged  Signed, Reino Bellis, NP  09/03/2016, 12:21 PM    I have seen, examined and evaluated the patient this PM along with Ms. Mancel Bale, NP.  After reviewing all the available data and chart, we discussed the patients laboratory, study & physical findings as well as symptoms in detail. I agree with her findings, examination as well as impression recommendations as per our discussion.    Pt with known CAD-PCI presented with somewhat atypical anginal features, but has ruled in for NSTEMI.  Currently pain free - planned LHC-[place] +/- PCI today. (he had diffuse moderate disease per last cath report)  - on IV Heparin gtt.  Continue BB, (Verapamil) & statin.  Standing Furosemide - seems Euvolemic.   Glenetta Hew, M.D., M.S. Interventional Cardiologist   Pager # [phone] Phone # [phone] [address] [place], [place] [address]

## 2016-09-03 NOTE — Progress Notes (Signed)
[place] for Heparin Indication: NSTEMI  No Known Allergies  Patient Measurements: Height: '5\' 9"'$  (175.3 cm) Weight: 280 lb 14.4 oz (127.4 kg) IBW/kg (Calculated) : 70.7 Heparin Dosing Weight: 100 kg  Assessment: 51 y.o. male with chest pain/NSTEMI on IV heparin. Heparin level is subtherapeutic at 0.1 on 1500 units/hr. Spoke with RN who reports no problems with infusion. No bleeding noted, CBC was normal yesterday. Now s/p cath to restart heparin 8 hrs post sheath removal. Sheath removed at 1815.   Goal of Therapy:  Heparin level 0.3-0.7 units/ml Monitor platelets by anticoagulation protocol: Yes   Plan:  Restart heparin gtt at 1,850 units/hr at 0200 tomorrow Check 6 hr heparin level Monitor daily heparin level, CBC, s/s of bleed  Elenor Quinones, PharmD, [facility] Clinical Pharmacist Pager [phone] 09/03/2016 6:17 PM

## 2016-09-03 NOTE — Progress Notes (Signed)
ANTICOAGULATION CONSULT NOTE - Initial Consult  Pharmacy Consult for Heparin Indication: chest pain/ACS  No Known Allergies  Patient Measurements: Height: '5\' 9"'$  (175.3 cm) Weight: 280 lb 14.4 oz (127.4 kg) IBW/kg (Calculated) : 70.7 Heparin Dosing Weight: 100 kg  Vital Signs: Temp: 98.4 F (36.9 C) (05/15 0112) Temp Source: Oral (05/15 0112) BP: 156/92 (05/15 0112) Pulse Rate: 91 (05/15 0112)  Labs:  Recent Labs  09/02/16 2235  HGB 15.4  HCT 44.8  PLT 180  CREATININE 1.01  TROPONINI 0.53*    Estimated Creatinine Clearance: 111.7 mL/min (by C-G formula based on SCr of 1.01 mg/dL).   Medical History: Past Medical History:  Diagnosis Date  . Asthma   . Blood dyscrasia    HIV  . Coronary artery disease    s/p NSTEMI 01/2011: LHC 02/18/11:  LAD 40%, 30%, dLAD 40%, mDx 80% (very small vessel), CFX 95%, tiny OM 90% (too small for PCI), RCA 60%, dRCA 70-80%, EF 55%.  He was tx with a Promus DES to the CFX.  Marland Kitchen Glucose intolerance (impaired glucose tolerance)    A1C 6.5 01/2011  . Hepatitis B   . HIV (human immunodeficiency virus infection) ([place])    on therapy  . HLD (hyperlipidemia)   . Hypertension   . MI (myocardial infarction) ([place])   . Renal stone 10/2013  . Sleep apnea    does not wear cpap  . Tobacco abuse     Medications:  Prescriptions Prior to Admission  Medication Sig Dispense Refill Last Dose  . aspirin EC 81 MG tablet Take 81 mg by mouth daily.     Taking  . baclofen (LIORESAL) 10 MG tablet Take 10 mg by mouth as needed for muscle spasms.    Taking  . cephALEXin (KEFLEX) 500 MG capsule Take 1 capsule (500 mg total) by mouth 4 (four) times daily. 28 capsule 0 Taking  . emtricitabine-tenofovir (TRUVADA) 200-300 MG per tablet Take 1 tablet by mouth.   Taking  . furosemide (LASIX) 40 MG tablet Take 80 mg by mouth 2 (two) times daily.    Taking  . metoprolol tartrate (LOPRESSOR) 25 MG tablet Take 2 tablets (50 mg total) by mouth 2 (two) times daily. MUST  HAVE APPOINTMENT FOR MORE REFILLS 60 tablet 0 Taking  . NITROSTAT 0.4 MG SL tablet Place 0.4 mg under the tongue every 5 (five) minutes as needed.    Taking  . pravastatin (PRAVACHOL) 40 MG tablet Take 1 tablet (40 mg total) by mouth every evening. 90 tablet 3   . raltegravir (ISENTRESS) 400 MG tablet Take 400 mg by mouth.   Taking  . sertraline (ZOLOFT) 50 MG tablet Take 50 mg by mouth daily.   Taking  . topiramate (TOPAMAX) 200 MG tablet Take 200 mg by mouth at bedtime.   Taking  . verapamil (CALAN-SR) 120 MG CR tablet Take 1 tablet (120 mg total) by mouth at bedtime. 90 tablet 3   . zolpidem (AMBIEN) 10 MG tablet Take 10 mg by mouth.   Taking    Assessment: 51 y.o. male with chest pain/NSTEMI for heparin  Goal of Therapy:  Heparin level 0.3-0.7 units/ml Monitor platelets by anticoagulation protocol: Yes   Plan:  Heparin 4000 units IV bolus, then start heparin 1500 units/hr Check heparin level in 6 hours.   Caryl Pina 09/03/2016,2:05 AM

## 2016-09-04 ENCOUNTER — Other Ambulatory Visit: Payer: Self-pay | Admitting: *Deleted

## 2016-09-04 ENCOUNTER — Inpatient Hospital Stay (HOSPITAL_COMMUNITY): Payer: 59

## 2016-09-04 ENCOUNTER — Encounter (HOSPITAL_COMMUNITY): Payer: Self-pay | Admitting: Cardiovascular Disease

## 2016-09-04 DIAGNOSIS — E784 Other hyperlipidemia: Secondary | ICD-10-CM

## 2016-09-04 DIAGNOSIS — I25110 Atherosclerotic heart disease of native coronary artery with unstable angina pectoris: Secondary | ICD-10-CM

## 2016-09-04 DIAGNOSIS — I2510 Atherosclerotic heart disease of native coronary artery without angina pectoris: Secondary | ICD-10-CM

## 2016-09-04 DIAGNOSIS — R072 Precordial pain: Secondary | ICD-10-CM

## 2016-09-04 DIAGNOSIS — I209 Angina pectoris, unspecified: Secondary | ICD-10-CM

## 2016-09-04 DIAGNOSIS — I25119 Atherosclerotic heart disease of native coronary artery with unspecified angina pectoris: Secondary | ICD-10-CM

## 2016-09-04 LAB — ECHOCARDIOGRAM COMPLETE
CHL CUP MV DEC (S): 261
E decel time: 261 msec
EERAT: 0.96
FS: 32 % (ref 28–44)
HEIGHTINCHES: 69 in
IVS/LV PW RATIO, ED: 0.97
LA ID, A-P, ES: 42 mm
LA diam end sys: 42 mm
LA diam index: 1.77 cm/m2
LAVOLA4C: 64.6 mL
LV E/e' medial: 0.96
LV E/e'average: 0.96
LV TDI E'LATERAL: 6.09
LVELAT: 6.09 cm/s
LVOT VTI: 22.6 cm
LVOT area: 3.14 cm2
LVOT peak grad rest: 4 mmHg
LVOT peak vel: 95.3 cm/s
LVOTD: 20 mm
LVOTSV: 71 mL
Lateral S' vel: 12.5 cm/s
MVPKAVEL: 97.5 m/s
MVPKEVEL: 5.87 m/s
PW: 12.4 mm — AB (ref 0.6–1.1)
TAPSE: 24.4 mm
TDI e' medial: 5.87
WEIGHTICAEL: 4414.4 [oz_av]

## 2016-09-04 LAB — CBC
HEMATOCRIT: 45 % (ref 39.0–52.0)
HEMOGLOBIN: 14.8 g/dL (ref 13.0–17.0)
MCH: 28.2 pg (ref 26.0–34.0)
MCHC: 32.9 g/dL (ref 30.0–36.0)
MCV: 85.9 fL (ref 78.0–100.0)
Platelets: 173 10*3/uL (ref 150–400)
RBC: 5.24 MIL/uL (ref 4.22–5.81)
RDW: 13.5 % (ref 11.5–15.5)
WBC: 7.4 10*3/uL (ref 4.0–10.5)

## 2016-09-04 LAB — HIV 1/2 AB DIFFERENTIATION
HIV 1 AB: POSITIVE — AB
HIV 2 AB: NEGATIVE

## 2016-09-04 LAB — GLUCOSE, CAPILLARY
GLUCOSE-CAPILLARY: 151 mg/dL — AB (ref 65–99)
Glucose-Capillary: 154 mg/dL — ABNORMAL HIGH (ref 65–99)

## 2016-09-04 LAB — HIV ANTIBODY (ROUTINE TESTING W REFLEX): HIV SCREEN 4TH GENERATION: REACTIVE — AB

## 2016-09-04 LAB — HEPARIN LEVEL (UNFRACTIONATED): HEPARIN UNFRACTIONATED: 0.23 [IU]/mL — AB (ref 0.30–0.70)

## 2016-09-04 MED ORDER — PERFLUTREN LIPID MICROSPHERE
1.0000 mL | INTRAVENOUS | Status: AC | PRN
  Administered 2016-09-04: 2 mL via INTRAVENOUS
  Filled 2016-09-04: qty 10

## 2016-09-04 MED ORDER — INSULIN ASPART 100 UNIT/ML ~~LOC~~ SOLN
0.0000 [IU] | Freq: Three times a day (TID) | SUBCUTANEOUS | Status: DC
  Administered 2016-09-05: 5 [IU] via SUBCUTANEOUS
  Administered 2016-09-05: 3 [IU] via SUBCUTANEOUS
  Administered 2016-09-05: 2 [IU] via SUBCUTANEOUS
  Administered 2016-09-06 (×3): 3 [IU] via SUBCUTANEOUS
  Administered 2016-09-07: 11 [IU] via SUBCUTANEOUS
  Administered 2016-09-07: 2 [IU] via SUBCUTANEOUS
  Administered 2016-09-07 – 2016-09-08 (×3): 3 [IU] via SUBCUTANEOUS
  Administered 2016-09-08: 2 [IU] via SUBCUTANEOUS
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
## 2016-09-04 NOTE — Progress Notes (Signed)
ANTICOAGULATION CONSULT NOTE  Pharmacy Consult for Heparin Indication: NSTEMI  No Known Allergies  Patient Measurements: Height: '5\' 9"'$  (175.3 cm) Weight: 275 lb 14.4 oz (125.1 kg) IBW/kg (Calculated) : 70.7 Heparin Dosing Weight: 100 kg  Vital Signs: Temp: 97.7 F (36.5 C) (05/16 1148) Temp Source: Oral (05/16 1148) BP: 144/88 (05/16 1148) Pulse Rate: 74 (05/16 1148)  Labs:  Recent Labs  09/02/16 2235 09/03/16 0553 09/03/16 0948 09/04/16 0503 09/04/16 0934  HGB 15.4  --   --  14.8  --   HCT 44.8  --   --  45.0  --   PLT 180  --   --  173  --   LABPROT  --  12.4  --   --   --   INR  --  0.92  --   --   --   HEPARINUNFRC  --   --  0.10*  --  <0.10*  CREATININE 1.01 0.85  --   --   --   TROPONINI 0.53* 0.88*  --   --   --     Estimated Creatinine Clearance: 131.5 mL/min (by C-G formula based on SCr of 0.85 mg/dL).   Assessment: 51 y.o. male with chest pain/NSTEMI on IV heparin post cath for 3V CAD, TCTS consulted for possible CABG. Heparin level is subtherapeutic at <0.1 on 1850 units/hr. Spoke with RN and no problems with infusion or line. No bleeding noted, CBC [place] stable.   Goal of Therapy:  Heparin level 0.3-0.7 units/ml Monitor platelets by anticoagulation protocol: Yes   Plan:  Increase heparin drip to 2200 units/hr 6 hr heparin level Daily heparin level and CBC Monitor for s/sx of bleeding F/u plan   Renold Genta, PharmD, BCPS Clinical Pharmacist Phone for today - [place] - [phone] 09/04/2016 12:08 PM

## 2016-09-04 NOTE — Progress Notes (Signed)
  Echocardiogram 2D Echocardiogram with definity has been performed. Technically difficult study due to patient body habitus.  Karolee Meloni L Androw 09/04/2016, 12:08 PM

## 2016-09-04 NOTE — Progress Notes (Signed)
ANTICOAGULATION CONSULT NOTE  Pharmacy Consult for Heparin Indication: NSTEMI  No Known Allergies  Patient Measurements: Height: '5\' 9"'$  (175.3 cm) Weight: 275 lb 14.4 oz (125.1 kg) IBW/kg (Calculated) : 70.7 Heparin Dosing Weight: 100 kg  Vital Signs: Temp: 97.7 F (36.5 C) (05/16 1148) Temp Source: Oral (05/16 1148) BP: 144/88 (05/16 1148) Pulse Rate: 74 (05/16 1148)  Labs:  Recent Labs  09/02/16 2235 09/03/16 0553 09/03/16 0948 09/04/16 0503 09/04/16 0934 09/04/16 1830  HGB 15.4  --   --  14.8  --   --   HCT 44.8  --   --  45.0  --   --   PLT 180  --   --  173  --   --   LABPROT  --  12.4  --   --   --   --   INR  --  0.92  --   --   --   --   HEPARINUNFRC  --   --  0.10*  --  <0.10* 0.23*  CREATININE 1.01 0.85  --   --   --   --   TROPONINI 0.53* 0.88*  --   --   --   --     Estimated Creatinine Clearance: 131.5 mL/min (by C-G formula based on SCr of 0.85 mg/dL).   Assessment: 51 y.o. male with chest pain/NSTEMI on IV heparin post cath for 3V CAD, TCTS consulted for possible CABG.   Heparin level this PM = 0.23  Goal of Therapy:  Heparin level 0.3-0.7 units/ml Monitor platelets by anticoagulation protocol: Yes   Plan:  Increase heparin drip to 2450 units/hr Daily heparin level and CBC Monitor for s/sx of bleeding F/u plan  Thank you Anette Guarneri, PharmD [phone] 09/04/2016 7:08 PM

## 2016-09-04 NOTE — Plan of Care (Signed)
Problem: Activity: Goal: Risk for activity intolerance will decrease Outcome: Completed/Met Date Met: 09/04/16 Pt ambulating with out difficulty

## 2016-09-04 NOTE — Progress Notes (Signed)
Progress Note  Patient Name: [name] Date of Encounter: 09/04/2016  Primary Cardiologist: Angelena Form  Subjective   No complaints  Inpatient Medications    Scheduled Meds: . aspirin EC  81 mg Oral Daily  . dolutegravir  50 mg Oral Daily  . emtricitabine-tenofovir AF  1 tablet Oral Daily  . furosemide  80 mg Oral BID  . metoprolol tartrate  50 mg Oral BID  . pravastatin  40 mg Oral QPM  . sertraline  50 mg Oral Daily  . sodium chloride flush  3 mL Intravenous Q12H  . verapamil  120 mg Oral QHS  . zolpidem  10 mg Oral QHS   Continuous Infusions: . sodium chloride    . heparin 1,850 Units/hr (09/04/16 0118)   PRN Meds: sodium chloride, acetaminophen, nitroGLYCERIN, ondansetron (ZOFRAN) IV, sodium chloride flush   Vital Signs    Vitals:   09/03/16 2142 09/04/16 0006 09/04/16 0625 09/04/16 0808  BP:  (!) 132/99 130/77 138/89  Pulse: 80 81 80 75  Resp:   19 (!) 21  Temp: 97.4 F (36.3 C) 97.9 F (36.6 C) 97.8 F (36.6 C) 97.9 F (36.6 C)  TempSrc: Oral Oral Oral Oral  SpO2: 97% 96% 97% 98%  Weight:   275 lb 14.4 oz (125.1 kg)   Height:        Intake/Output Summary (Last 24 hours) at 09/04/16 8466 Last data filed at 09/04/16 0600  Gross per 24 hour  Intake          1720.59 ml  Output             2725 ml  Net         -1004.41 ml   Filed Weights   09/03/16 0112 09/04/16 0625  Weight: 280 lb 14.4 oz (127.4 kg) 275 lb 14.4 oz (125.1 kg)    Telemetry    SR - Personally Reviewed  ECG    N/a- Personally Reviewed  Physical Exam   General: Well developed, well nourished, male appearing in no acute distress. Head: Normocephalic, atraumatic.  Neck: Supple without bruits, JVD. Lungs:  Resp regular and unlabored, CTA. Heart: RRR, S1, S2, no S3, S4, or murmur; no rub. Abdomen: Soft, non-tender, non-distended with normoactive bowel sounds. No hepatomegaly. No rebound/guarding. No obvious abdominal masses. Extremities: No clubbing, cyanosis, edema. Distal  pedal pulses are 2+ bilaterally. Right groin site stable.  Neuro: Alert and oriented X 3. Moves all extremities spontaneously. Psych: Normal affect.  Labs    Chemistry Recent Labs Lab 09/02/16 2235 09/03/16 0553  NA 135 137  K 3.9 3.5  CL 104 104  CO2 24 23  GLUCOSE 258* 156*  BUN 14 12  CREATININE 1.01 0.85  CALCIUM 9.4 9.2  PROT 6.9  --   ALBUMIN 4.0  --   AST 38  --   ALT 59  --   ALKPHOS 132*  --   BILITOT 0.4  --   GFRNONAA >60 >60  GFRAA >60 >60  ANIONGAP 7 10     Hematology Recent Labs Lab 09/02/16 2235 09/04/16 0503  WBC 6.9 7.4  RBC 5.31 5.24  HGB 15.4 14.8  HCT 44.8 45.0  MCV 84.4 85.9  MCH 29.0 28.2  MCHC 34.4 32.9  RDW 13.2 13.5  PLT 180 173    Cardiac Enzymes Recent Labs Lab 09/02/16 2235 09/03/16 0553  TROPONINI 0.53* 0.88*   No results for input(s): TROPIPOC in the last 168 hours.   BNPNo results for input(s):  BNP, PROBNP in the last 168 hours.   DDimer No results for input(s): DDIMER in the last 168 hours.    Radiology    Dg Chest 2 View  Result Date: 09/02/2016 CLINICAL DATA:  Right-sided chest pain, onset this evening. EXAM: CHEST  2 VIEW COMPARISON:  Radiographs 02/17/2011 FINDINGS: The cardiomediastinal contours are normal. The lungs are clear. Pulmonary vasculature is normal. No consolidation, pleural effusion, or pneumothorax. No acute osseous abnormalities are seen. IMPRESSION: No acute abnormality. Electronically Signed   By: Jeb Levering M.D.   On: 09/02/2016 22:43   Ct Angio Chest Aorta W And/or Wo Contrast  Result Date: 09/03/2016 CLINICAL DATA:  Right-sided chest pain radiating to the upper back. EXAM: CT ANGIOGRAPHY CHEST WITH CONTRAST TECHNIQUE: Multidetector CT imaging of the chest was performed using the standard protocol during bolus administration of intravenous contrast. Multiplanar CT image reconstructions and MIPs were obtained to evaluate the vascular anatomy. CONTRAST:  100 cc Isovue 370 IV COMPARISON:   None. FINDINGS: Cardiovascular: No large central pulmonary embolus, aortic aneurysm or dissection. Normal size cardiac chambers. Coronary arteriosclerosis with coronary arterial stent in the circumflex. No pericardial effusion. Mediastinum/Nodes: Heterogeneously enhancing thyroid isthmic and left-sided nodule measuring 2.1 x 3.4 cm on series 4, image 8. No adenopathy. Esophagus is unremarkable. The trachea and mainstem bronchi are patent. Lungs/Pleura: Bibasilar dependent atelectasis. No pneumonic consolidation, effusion or pneumothorax. Upper Abdomen: Hepatic steatosis. No acute abnormality in the upper abdomen. Small splenule is seen at the splenic hilum. The visualized adrenal glands and pancreas are nonacute. Musculoskeletal: No chest wall abnormality. No acute or significant osseous findings. Review of the MIP images confirms the above findings. IMPRESSION: 1. No aortic aneurysm, dissection nor large central pulmonary embolus. 2. No active pulmonary disease. 3. Coronary arteriosclerosis coronary stent in the circumflex. 4. Hepatic steatosis. 5. Heterogeneous enhancing cystic nodule measuring 3.4 x 2.1 cm in the thyroid isthmus and left lobe of the thyroid gland. Additional characterization with ultrasound is suggested. This follows ACR consensus guidelines: Managing Incidental Thyroid Nodules Detected on Imaging: White Paper of the ACR Incidental Thyroid Findings Committee. J Am Coll Radiol 2015; 12:143-150. Electronically Signed   By: Ashley Royalty M.D.   On: 09/03/2016 00:33    Cardiac Studies   LHC: 09/03/16  Conclusion   1. Severe 3 vessel CAD with:   Total occlusion of the mid-LAD collateralized from the distal RCA  Severe circumflex stenosis proximal to the old stent and moderate in-stent restenosis in the distal part of the stent  Moderate/severe ulcerated proximal RCA stenosis and moderate focal mid-RCA stenosis 2. Probable mild LV systolic dysfunction (will check echo to better  evaluate)  Recommend: TCTS consult for CABG in this patient with severe 3 vessel CAD and diabetes   Diagnostic Diagram       Patient Profile     51 y.o. male PMH of CAD s/p DES Lcx (2012), HIV, HL, tobacco abuse, OSA, UC, and DM who presented with chest pain.   Assessment & Plan    1. NSTEMI: Underwent LHC with Dr. Burt Knack yesterday noted above with diffuse 3v disease. Recommended surgery consult for possible CABG. No chest pain. Remains on IV heparin. No signs of volume overload.  -- on BB, statin, ASA, ARB  2. HIV: continue home meds  3. HL: on statin  4. NIDDM: SSI, metformin held  Signed, Reino Bellis, NP  09/04/2016, 9:27 AM

## 2016-09-05 ENCOUNTER — Inpatient Hospital Stay (HOSPITAL_COMMUNITY): Payer: 59

## 2016-09-05 DIAGNOSIS — E119 Type 2 diabetes mellitus without complications: Secondary | ICD-10-CM

## 2016-09-05 DIAGNOSIS — Z21 Asymptomatic human immunodeficiency virus [HIV] infection status: Secondary | ICD-10-CM

## 2016-09-05 DIAGNOSIS — Z7982 Long term (current) use of aspirin: Secondary | ICD-10-CM

## 2016-09-05 DIAGNOSIS — Z79899 Other long term (current) drug therapy: Secondary | ICD-10-CM

## 2016-09-05 LAB — BLOOD GAS, ARTERIAL
Acid-base deficit: 1.9 mmol/L (ref 0.0–2.0)
Bicarbonate: 22 mmol/L (ref 20.0–28.0)
Drawn by: 257881
FIO2: 0.21
O2 Saturation: 96 %
Patient temperature: 98.6
pCO2 arterial: 35.1 mmHg (ref 32.0–48.0)
pH, Arterial: 7.412 (ref 7.350–7.450)
pO2, Arterial: 82.1 mmHg — ABNORMAL LOW (ref 83.0–108.0)

## 2016-09-05 LAB — CBC
HEMATOCRIT: 45.4 % (ref 39.0–52.0)
HEMOGLOBIN: 14.8 g/dL (ref 13.0–17.0)
MCH: 27.9 pg (ref 26.0–34.0)
MCHC: 32.6 g/dL (ref 30.0–36.0)
MCV: 85.7 fL (ref 78.0–100.0)
Platelets: 159 10*3/uL (ref 150–400)
RBC: 5.3 MIL/uL (ref 4.22–5.81)
RDW: 13.4 % (ref 11.5–15.5)
WBC: 6.4 10*3/uL (ref 4.0–10.5)

## 2016-09-05 LAB — BASIC METABOLIC PANEL
Anion gap: 11 (ref 5–15)
BUN: 9 mg/dL (ref 6–20)
CO2: 21 mmol/L — ABNORMAL LOW (ref 22–32)
Calcium: 8.7 mg/dL — ABNORMAL LOW (ref 8.9–10.3)
Chloride: 104 mmol/L (ref 101–111)
Creatinine, Ser: 0.96 mg/dL (ref 0.61–1.24)
GFR calc Af Amer: >60 mL/min (ref >60)
GLUCOSE: 141 mg/dL — AB (ref 65–99)
POTASSIUM: 3.4 mmol/L — AB (ref 3.5–5.1)
Sodium: 136 mmol/L (ref 135–145)

## 2016-09-05 LAB — GLUCOSE, CAPILLARY
GLUCOSE-CAPILLARY: 147 mg/dL — AB (ref 65–99)
GLUCOSE-CAPILLARY: 170 mg/dL — AB (ref 65–99)
GLUCOSE-CAPILLARY: 223 mg/dL — AB (ref 65–99)
Glucose-Capillary: 215 mg/dL — ABNORMAL HIGH (ref 65–99)

## 2016-09-05 LAB — HEPARIN LEVEL (UNFRACTIONATED): Heparin Unfractionated: 0.41 IU/mL (ref 0.30–0.70)

## 2016-09-05 LAB — PLATELET INHIBITION P2Y12: Platelet Function  P2Y12: 147 [PRU] — ABNORMAL LOW (ref 194–418)

## 2016-09-05 MED ORDER — POTASSIUM CHLORIDE CRYS ER 20 MEQ PO TBCR
40.0000 meq | EXTENDED_RELEASE_TABLET | Freq: Once | ORAL | Status: AC
  Administered 2016-09-05: 40 meq via ORAL
  Filled 2016-09-05: qty 2

## 2016-09-05 MED ORDER — GUAIFENESIN ER 600 MG PO TB12
600.0000 mg | ORAL_TABLET | Freq: Two times a day (BID) | ORAL | Status: DC
  Administered 2016-09-05 – 2016-09-08 (×8): 600 mg via ORAL
  Filled 2016-09-05 (×8): qty 1

## 2016-09-05 MED ORDER — MOMETASONE FURO-FORMOTEROL FUM 200-5 MCG/ACT IN AERO
2.0000 | INHALATION_SPRAY | Freq: Two times a day (BID) | RESPIRATORY_TRACT | Status: DC
  Administered 2016-09-05 – 2016-09-08 (×4): 2 via RESPIRATORY_TRACT
  Filled 2016-09-05 (×2): qty 8.8

## 2016-09-05 MED ORDER — INSULIN GLARGINE 100 UNIT/ML ~~LOC~~ SOLN
10.0000 [IU] | Freq: Two times a day (BID) | SUBCUTANEOUS | Status: DC
  Administered 2016-09-05 – 2016-09-08 (×8): 10 [IU] via SUBCUTANEOUS
  Filled 2016-09-05 (×9): qty 0.1
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
## 2016-09-05 NOTE — Progress Notes (Signed)
CARDIAC REHAB PHASE I   PRE:  Rate/Rhythm: 81 SR  BP:  Sitting: 115/77        SaO2: 98 RA  MODE:  Ambulation: 550 ft   POST:  Rate/Rhythm: 105 ST  BP:  Sitting: 127/77         SaO2: 98 RA  Pt ambulated 550 ft on RA, IV, hand held assist, steady gait, tolerated well with no complaints. Cardiac surgery pre-op education completed with pt. Reviewed IS, sternal precautions, activity progression, cardiac surgery booklet and cardiac surgery guidelines. Pt verbalized understanding. Declined cardiac surgery videos. Pt to edge of bed after walk, call bell within reach. Will follow.   [phone] Lenna Sciara, RN, BSN 09/05/2016 11:41 AM

## 2016-09-05 NOTE — Progress Notes (Signed)
Progress Note  Patient Name: [name] Date of Encounter: 09/05/2016  Primary Cardiologist: Angelena Form  Subjective   No chest pain, lying in bed this morning.   Inpatient Medications    Scheduled Meds: . aspirin EC  81 mg Oral Daily  . dolutegravir  50 mg Oral Daily  . emtricitabine-tenofovir AF  1 tablet Oral Daily  . furosemide  80 mg Oral BID  . guaiFENesin  600 mg Oral BID  . insulin aspart  0-15 Units Subcutaneous TID WC  . insulin glargine  10 Units Subcutaneous BID  . metoprolol tartrate  50 mg Oral BID  . mometasone-formoterol  2 puff Inhalation BID  . potassium chloride  40 mEq Oral Once  . pravastatin  40 mg Oral QPM  . sertraline  50 mg Oral Daily  . sodium chloride flush  3 mL Intravenous Q12H  . verapamil  120 mg Oral QHS  . zolpidem  10 mg Oral QHS   Continuous Infusions: . sodium chloride    . heparin 2,450 Units/hr (09/05/16 0219)   PRN Meds: sodium chloride, acetaminophen, nitroGLYCERIN, ondansetron (ZOFRAN) IV, sodium chloride flush   Vital Signs    Vitals:   09/04/16 2049 09/05/16 0010 09/05/16 0523 09/05/16 0900  BP: 112/71 (!) 145/89 (!) 147/84 (!) 111/56  Pulse: 65 72 71 80  Resp: (!) 23 (!) 26 (!) 25 18  Temp: 97.8 F (36.6 C) 97.9 F (36.6 C) 97.7 F (36.5 C) 97.5 F (36.4 C)  TempSrc: Oral Oral Oral Oral  SpO2: 100% 98% 97% 98%  Weight:   272 lb 1.6 oz (123.4 kg)   Height:        Intake/Output Summary (Last 24 hours) at 09/05/16 1009 Last data filed at 09/05/16 0911  Gross per 24 hour  Intake            938.6 ml  Output             2175 ml  Net          -1236.4 ml   Filed Weights   09/03/16 0112 09/04/16 0625 09/05/16 0523  Weight: 280 lb 14.4 oz (127.4 kg) 275 lb 14.4 oz (125.1 kg) 272 lb 1.6 oz (123.4 kg)    Telemetry    SR - Personally Reviewed  ECG    N/A- Personally Reviewed  Physical Exam   General: Well developed, well nourished, male appearing in no acute distress. Head: Normocephalic,  atraumatic.  Neck: Supple without bruits, JVD. Lungs:  Resp regular and unlabored, CTA. Heart: RRR, S1, S2, no S3, S4, or murmur; no rub. Abdomen: Soft, non-tender, non-distended with normoactive bowel sounds. No hepatomegaly. No rebound/guarding. No obvious abdominal masses. Extremities: No clubbing, cyanosis, edema. Distal pedal pulses are 2+ bilaterally. Neuro: Alert and oriented X 3. Moves all extremities spontaneously. Psych: Normal affect.  Labs    Chemistry Recent Labs Lab 09/02/16 2235 09/03/16 0553 09/05/16 0401  NA 135 137 136  K 3.9 3.5 3.4*  CL 104 104 104  CO2 24 23 21*  GLUCOSE 258* 156* 141*  BUN '14 12 9  '$ CREATININE 1.01 0.85 0.96  CALCIUM 9.4 9.2 8.7*  PROT 6.9  --   --   ALBUMIN 4.0  --   --   AST 38  --   --   ALT 59  --   --   ALKPHOS 132*  --   --   BILITOT 0.4  --   --   GFRNONAA >60 >60 >60  GFRAA >60 >60 >60  ANIONGAP '7 10 11     '$ Hematology Recent Labs Lab 09/02/16 2235 09/04/16 0503 09/05/16 0401  WBC 6.9 7.4 6.4  RBC 5.31 5.24 5.30  HGB 15.4 14.8 14.8  HCT 44.8 45.0 45.4  MCV 84.4 85.9 85.7  MCH 29.0 28.2 27.9  MCHC 34.4 32.9 32.6  RDW 13.2 13.5 13.4  PLT 180 173 159    Cardiac Enzymes Recent Labs Lab 09/02/16 2235 09/03/16 0553  TROPONINI 0.53* 0.88*   No results for input(s): TROPIPOC in the last 168 hours.   BNPNo results for input(s): BNP, PROBNP in the last 168 hours.   DDimer No results for input(s): DDIMER in the last 168 hours.    Radiology    No results found.  Cardiac Studies   LHC: 09/03/16  Conclusion   1. Severe 3 vessel CAD with:   Total occlusion of the mid-LAD collateralized from the distal RCA  Severe circumflex stenosis proximal to the old stent and moderate in-stent restenosis in the distal part of the stent  Moderate/severe ulcerated proximal RCA stenosis and moderate focal mid-RCA stenosis 2. Probable mild LV systolic dysfunction (will check echo to better evaluate)  Recommend: TCTS  consult for CABG in this patient with severe 3 vessel CAD and diabetes   Diagnostic Diagram       Patient Profile     51 y.o. male with PMH of CAD s/p DES Lcx (2012), HIV, HL, tobacco abuse, OSA, UC, and DM who presented with chest pain. Underwent LHC on 09/03/16 with 3 v disease noted. Referred to surgery for possible CABG.   Assessment & Plan    1. NSTEMI: Underwent LHC with Dr. Burt Knack yesterday noted above with diffuse 3v disease. Trop peaked at 0.88. Recommended surgery consult for possible CABG. No chest pain. Remains on IV heparin. No signs of volume overload. Seen by surgery, discussed with Thurmond Butts (with CVTS) plan is for CABG on Monday.  -- on BB, statin, ASA, ARB  2. HIV: continue home meds  3. HL: on statin  4. NIDDM: SSI, metformin held  5. Hypokalemia: Replaced x1. Follow BMET  Signed, Reino Bellis, NP  09/05/2016, 10:09 AM    I have seen, examined and evaluated the patient this PM along with Reino Bellis, NP-C.  After reviewing all the available data and chart, we discussed the patients laboratory, study & physical findings as well as symptoms in detail. I agree with her findings, examination as well as impression recommendations as per our discussion.    Attending adjustments noted in italics.   Doing well. Very stable. No further anginal symptoms. On stable medications with stable blood pressure. Waiting for CABG. My initial understanding was that this may have been this week, however appears to be delayed mouth on Monday. We will monitor him while inpatient, but no major changes.   Glenetta Hew, M.D., M.S. Interventional Cardiologist   Pager # [phone] Phone # [phone] [address] [place], [place] [address]

## 2016-09-05 NOTE — Plan of Care (Signed)
Problem: Activity: Goal: Risk for activity intolerance will decrease Outcome: Progressing Pt ambulating with Cardiac rehab pre surgery

## 2016-09-05 NOTE — Progress Notes (Signed)
ANTICOAGULATION CONSULT NOTE - Follow Up Consult  Pharmacy Consult for Heparin Indication: CAD pending CABG  No Known Allergies  Patient Measurements: Height: '5\' 9"'$  (175.3 cm) Weight: 272 lb 1.6 oz (123.4 kg) IBW/kg (Calculated) : 70.7 Heparin Dosing Weight: 100 kg  Vital Signs: Temp: 97.5 F (36.4 C) (05/17 0900) Temp Source: Oral (05/17 0900) BP: 111/56 (05/17 0900) Pulse Rate: 80 (05/17 0900)  Labs:  Recent Labs  09/02/16 2235 09/03/16 0553  09/04/16 0503 09/04/16 0934 09/04/16 1830 09/05/16 0401  HGB 15.4  --   --  14.8  --   --  14.8  HCT 44.8  --   --  45.0  --   --  45.4  PLT 180  --   --  173  --   --  159  LABPROT  --  12.4  --   --   --   --   --   INR  --  0.92  --   --   --   --   --   HEPARINUNFRC  --   --   < >  --  <0.10* 0.23* 0.41  CREATININE 1.01 0.85  --   --   --   --  0.96  TROPONINI 0.53* 0.88*  --   --   --   --   --   < > = values in this interval not displayed.  Estimated Creatinine Clearance: 115.5 mL/min (by C-G formula based on SCr of 0.96 mg/dL).   Medications:  Scheduled:  . aspirin EC  81 mg Oral Daily  . dolutegravir  50 mg Oral Daily  . emtricitabine-tenofovir AF  1 tablet Oral Daily  . furosemide  80 mg Oral BID  . guaiFENesin  600 mg Oral BID  . insulin aspart  0-15 Units Subcutaneous TID WC  . insulin glargine  10 Units Subcutaneous BID  . metoprolol tartrate  50 mg Oral BID  . mometasone-formoterol  2 puff Inhalation BID  . potassium chloride  40 mEq Oral Once  . pravastatin  40 mg Oral QPM  . sertraline  50 mg Oral Daily  . sodium chloride flush  3 mL Intravenous Q12H  . verapamil  120 mg Oral QHS  . zolpidem  10 mg Oral QHS   Infusions:  . sodium chloride    . heparin 2,450 Units/hr (09/05/16 9628)    Assessment: 51 yo M heparin for multivessel CAD pending CABG 5/21.  Pt remains therapeutic on 2450 units/hr.  No bleeding noted.  Goal of Therapy:  Heparin level 0.3-0.7 units/ml Monitor platelets by  anticoagulation protocol: Yes   Plan:  Continue heparin at 2450 units/hr Heparin level and CBC daily.  [employer], Pharm.D., BCPS Clinical Pharmacist Pager: [phone] Clinical phone for 09/05/2016 from 8:30-4:00 is [phone]. After 4pm, please call Main Rx [phone]) for assistance. 09/05/2016 12:56 PM

## 2016-09-06 ENCOUNTER — Inpatient Hospital Stay (HOSPITAL_COMMUNITY): Payer: 59

## 2016-09-06 DIAGNOSIS — R7302 Impaired glucose tolerance (oral): Secondary | ICD-10-CM

## 2016-09-06 DIAGNOSIS — Z01810 Encounter for preprocedural cardiovascular examination: Secondary | ICD-10-CM

## 2016-09-06 DIAGNOSIS — Z720 Tobacco use: Secondary | ICD-10-CM

## 2016-09-06 DIAGNOSIS — B20 Human immunodeficiency virus [HIV] disease: Secondary | ICD-10-CM

## 2016-09-06 DIAGNOSIS — Z9861 Coronary angioplasty status: Secondary | ICD-10-CM

## 2016-09-06 LAB — CBC
HEMATOCRIT: 46.5 % (ref 39.0–52.0)
Hemoglobin: 15.5 g/dL (ref 13.0–17.0)
MCH: 28.4 pg (ref 26.0–34.0)
MCHC: 33.3 g/dL (ref 30.0–36.0)
MCV: 85.3 fL (ref 78.0–100.0)
PLATELETS: 184 10*3/uL (ref 150–400)
RBC: 5.45 MIL/uL (ref 4.22–5.81)
RDW: 13.4 % (ref 11.5–15.5)
WBC: 8.6 10*3/uL (ref 4.0–10.5)

## 2016-09-06 LAB — PULMONARY FUNCTION TEST
DL/VA % pred: 87 %
DL/VA: 4.01 ml/min/mmHg/L
DLCO cor % pred: 79 %
DLCO cor: 24.61 ml/min/mmHg
DLCO unc % pred: 81 %
DLCO unc: 25.21 ml/min/mmHg
FEF 25-75 Post: 3.3 L/sec
FEF 25-75 Pre: 3.19 L/sec
FEF2575-%Change-Post: 3 %
FEF2575-%Pred-Post: 102 %
FEF2575-%Pred-Pre: 98 %
FEV1-%Change-Post: 0 %
FEV1-%Pred-Post: 92 %
FEV1-%Pred-Pre: 92 %
FEV1-Post: 3.42 L
FEV1-Pre: 3.43 L
FEV1FVC-%Change-Post: 2 %
FEV1FVC-%Pred-Pre: 100 %
FEV6-%Change-Post: -2 %
FEV6-%Pred-Post: 93 %
FEV6-%Pred-Pre: 95 %
FEV6-Post: 4.31 L
FEV6-Pre: 4.43 L
FEV6FVC-%Pred-Post: 104 %
FEV6FVC-%Pred-Pre: 104 %
FVC-%Change-Post: -2 %
FVC-%Pred-Post: 89 %
FVC-%Pred-Pre: 91 %
FVC-Post: 4.31 L
FVC-Pre: 4.43 L
Post FEV1/FVC ratio: 79 %
Post FEV6/FVC ratio: 100 %
Pre FEV1/FVC ratio: 78 %
Pre FEV6/FVC Ratio: 100 %
RV % pred: 83 %
RV: 1.71 L
TLC % pred: 90 %
TLC: 6.13 L

## 2016-09-06 LAB — VAS US DOPPLER PRE CABG
LEFT ECA DIAS: -17 cm/s
LEFT VERTEBRAL DIAS: 4 cm/s
Left CCA dist dias: -18 cm/s
Left CCA dist sys: -72 cm/s
Left CCA prox dias: 22 cm/s
Left CCA prox sys: 116 cm/s
Left ICA dist dias: -30 cm/s
Left ICA dist sys: -84 cm/s
Left ICA prox dias: -22 cm/s
Left ICA prox sys: -72 cm/s
RIGHT ECA DIAS: -18 cm/s
RIGHT VERTEBRAL DIAS: 18 cm/s
Right CCA prox dias: 24 cm/s
Right CCA prox sys: 126 cm/s
Right cca dist sys: -55 cm/s

## 2016-09-06 LAB — HEPATITIS C ANTIBODY: HCV Ab: <0.1 s/co ratio (ref 0.0–0.9)

## 2016-09-06 LAB — GLUCOSE, CAPILLARY
GLUCOSE-CAPILLARY: 157 mg/dL — AB (ref 65–99)
GLUCOSE-CAPILLARY: 177 mg/dL — AB (ref 65–99)
Glucose-Capillary: 163 mg/dL — ABNORMAL HIGH (ref 65–99)
Glucose-Capillary: 176 mg/dL — ABNORMAL HIGH (ref 65–99)

## 2016-09-06 LAB — URINALYSIS, ROUTINE W REFLEX MICROSCOPIC
Bacteria, UA: NONE SEEN
Bilirubin Urine: NEGATIVE
Glucose, UA: >=500 mg/dL — AB
Hgb urine dipstick: NEGATIVE
Ketones, ur: NEGATIVE mg/dL
Leukocytes, UA: NEGATIVE
Nitrite: NEGATIVE
Protein, ur: NEGATIVE mg/dL
Specific Gravity, Urine: 1.011 (ref 1.005–1.030)
Squamous Epithelial / LPF: NONE SEEN
pH: 6 (ref 5.0–8.0)

## 2016-09-06 LAB — PREALBUMIN: Prealbumin: 23.4 mg/dL (ref 18–38)

## 2016-09-06 LAB — COMPREHENSIVE METABOLIC PANEL
ALT: 79 U/L — ABNORMAL HIGH (ref 17–63)
AST: 53 U/L — ABNORMAL HIGH (ref 15–41)
Albumin: 4 g/dL (ref 3.5–5.0)
Alkaline Phosphatase: 115 U/L (ref 38–126)
Anion gap: 11 (ref 5–15)
BUN: 10 mg/dL (ref 6–20)
CO2: 23 mmol/L (ref 22–32)
Calcium: 9 mg/dL (ref 8.9–10.3)
Chloride: 102 mmol/L (ref 101–111)
Creatinine, Ser: 1.02 mg/dL (ref 0.61–1.24)
GFR calc Af Amer: >60 mL/min (ref >60)
GFR calc non Af Amer: >60 mL/min (ref >60)
Glucose, Bld: 174 mg/dL — ABNORMAL HIGH (ref 65–99)
Potassium: 3.5 mmol/L (ref 3.5–5.1)
Sodium: 136 mmol/L (ref 135–145)
Total Bilirubin: 0.7 mg/dL (ref 0.3–1.2)
Total Protein: 7 g/dL (ref 6.5–8.1)

## 2016-09-06 LAB — SURGICAL PCR SCREEN
MRSA, PCR: NEGATIVE
Staphylococcus aureus: NEGATIVE

## 2016-09-06 LAB — HIV-1 RNA QUANT-NO REFLEX-BLD
HIV 1 RNA Quant: 53200 copies/mL
LOG10 HIV-1 RNA: 4.726 log10copy/mL

## 2016-09-06 LAB — TSH: TSH: 4.584 u[IU]/mL — ABNORMAL HIGH (ref 0.350–4.500)

## 2016-09-06 LAB — PROTIME-INR
INR: 1
Prothrombin Time: 13.2 seconds (ref 11.4–15.2)

## 2016-09-06 LAB — HEPARIN LEVEL (UNFRACTIONATED)
HEPARIN UNFRACTIONATED: 0.35 [IU]/mL (ref 0.30–0.70)
Heparin Unfractionated: 0.83 IU/mL — ABNORMAL HIGH (ref 0.30–0.70)

## 2016-09-06 MED ORDER — ALBUTEROL SULFATE (2.5 MG/3ML) 0.083% IN NEBU
2.5000 mg | INHALATION_SOLUTION | Freq: Once | RESPIRATORY_TRACT | Status: AC
  Administered 2016-09-06: 2.5 mg via RESPIRATORY_TRACT
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
## 2016-09-06 NOTE — Progress Notes (Signed)
ANTICOAGULATION CONSULT NOTE - Follow Up Consult  Pharmacy Consult for Heparin Indication: CAD pending CABG  No Known Allergies  Patient Measurements: Height: '5\' 9"'$  (175.3 cm) Weight: 273 lb 8 oz (124.1 kg) IBW/kg (Calculated) : 70.7 Heparin Dosing Weight: 100 kg  Vital Signs: Temp: 98 F (36.7 C) (05/18 0437) Temp Source: Oral (05/18 0437) BP: 144/91 (05/18 0437) Pulse Rate: 72 (05/18 0437)  Labs:  Recent Labs  09/04/16 0503  09/04/16 1830 09/05/16 0401 09/06/16 0505 09/06/16 0513  HGB 14.8  --   --  14.8  --  15.5  HCT 45.0  --   --  45.4  --  46.5  PLT 173  --   --  159  --  184  LABPROT  --   --   --   --   --  13.2  INR  --   --   --   --   --  1.00  HEPARINUNFRC  --   < > 0.23* 0.41 0.83*  --   CREATININE  --   --   --  0.96  --   --   < > = values in this interval not displayed.  Estimated Creatinine Clearance: 115.9 mL/min (by C-G formula based on SCr of 0.96 mg/dL).   Assessment: 51 yo M heparin for multivessel CAD pending CABG 5/21. Heparin level up to supratherapeutic (0.83) on gtt at 2450 units/hr. CBC stable. No bleeding noted.  Goal of Therapy:  Heparin level 0.3-0.7 units/ml Monitor platelets by anticoagulation protocol: Yes   Plan:  Decrease heparin to 2250 units/hr Will f/u 6 hr heparin level  Sherlon Handing, PharmD, BCPS Clinical pharmacist, pager [phone] 09/06/2016 6:31 AM

## 2016-09-06 NOTE — Progress Notes (Signed)
ANTICOAGULATION CONSULT NOTE - Follow Up Consult  Pharmacy Consult for Heparin Indication: CAD pending CABG  Allergies  Allergen Reactions  . No Known Allergies     Patient Measurements: Height: '5\' 9"'$  (175.3 cm) Weight: 273 lb 8 oz (124.1 kg) IBW/kg (Calculated) : 70.7 Heparin Dosing Weight: 100 kg  Vital Signs: Temp: 98 F (36.7 C) (05/18 0437) Temp Source: Oral (05/18 0437) BP: 144/91 (05/18 0437) Pulse Rate: 72 (05/18 0437)  Labs:  Recent Labs  09/04/16 0503  09/05/16 0401 09/06/16 0505 09/06/16 0513 09/06/16 1348  HGB 14.8  --  14.8  --  15.5  --   HCT 45.0  --  45.4  --  46.5  --   PLT 173  --  159  --  184  --   LABPROT  --   --   --   --  13.2  --   INR  --   --   --   --  1.00  --   HEPARINUNFRC  --   < > 0.41 0.83*  --  0.35  CREATININE  --   --  0.96  --  1.02  --   < > = values in this interval not displayed.  Estimated Creatinine Clearance: 109.1 mL/min (by C-G formula based on SCr of 1.02 mg/dL).   Assessment: 51 yo M heparin for multivessel CAD pending CABG 5/21. Heparin level now therapeutic after rate change.  CBC stable. No bleeding noted.  Goal of Therapy:  Heparin level 0.3-0.7 units/ml Monitor platelets by anticoagulation protocol: Yes   Plan:  Continue heparin to 2250 units/hr Daily heparin level and CBC   Annalynne Ibanez, Pharm.D., BCPS Clinical Pharmacist Pager: [phone] Clinical phone for 09/06/2016 from 8:30-4:00 is [phone]. After 4pm, please call Main Rx [phone]) for assistance. 09/06/2016 2:57 PM

## 2016-09-06 NOTE — Progress Notes (Signed)
Pre-op Cardiac Surgery  Carotid Findings:  Findings suggest 1-39% internal carotid artery stenosis bilaterally. Vertebral arteries are patent with antegrade flow.  Upper Extremity Right Left  Brachial Pressures 115-Triphasic 113-Triphasic  Radial Waveforms Triphasic Triphasic  Ulnar Waveforms Triphasic Triphasic  Palmar Arch (Allen's Test) Signal decreases 50% with radial compression, obliterates with ulnar compression. Within normal limits.   Lower  Extremity Right Left  Dorsalis Pedis Triphasic Triphasic  Posterior Tibial Triphasic Triphasic   Pedal waveforms are within normal limits bilaterally.  09/06/2016 11:33 AM Maudry Mayhew, BS, RVT, RDCS, RDMS

## 2016-09-07 LAB — CBC
HCT: 44.6 % (ref 39.0–52.0)
Hemoglobin: 14.8 g/dL (ref 13.0–17.0)
MCH: 28.2 pg (ref 26.0–34.0)
MCHC: 33.2 g/dL (ref 30.0–36.0)
MCV: 85 fL (ref 78.0–100.0)
PLATELETS: 175 10*3/uL (ref 150–400)
RBC: 5.25 MIL/uL (ref 4.22–5.81)
RDW: 13.2 % (ref 11.5–15.5)
WBC: 7.3 10*3/uL (ref 4.0–10.5)

## 2016-09-07 LAB — BASIC METABOLIC PANEL
ANION GAP: 8 (ref 5–15)
BUN: 10 mg/dL (ref 6–20)
CALCIUM: 9.1 mg/dL (ref 8.9–10.3)
CO2: 23 mmol/L (ref 22–32)
Chloride: 104 mmol/L (ref 101–111)
Creatinine, Ser: 1 mg/dL (ref 0.61–1.24)
GFR calc Af Amer: >60 mL/min (ref >60)
Glucose, Bld: 186 mg/dL — ABNORMAL HIGH (ref 65–99)
POTASSIUM: 3.3 mmol/L — AB (ref 3.5–5.1)
SODIUM: 135 mmol/L (ref 135–145)

## 2016-09-07 LAB — GLUCOSE, CAPILLARY
GLUCOSE-CAPILLARY: 309 mg/dL — AB (ref 65–99)
Glucose-Capillary: 156 mg/dL — ABNORMAL HIGH (ref 65–99)
Glucose-Capillary: 148 mg/dL — ABNORMAL HIGH (ref 65–99)
Glucose-Capillary: 157 mg/dL — ABNORMAL HIGH (ref 65–99)

## 2016-09-07 LAB — PLATELET INHIBITION P2Y12: Platelet Function  P2Y12: 195 [PRU] (ref 194–418)

## 2016-09-07 LAB — HEPARIN LEVEL (UNFRACTIONATED)
HEPARIN UNFRACTIONATED: 0.38 [IU]/mL (ref 0.30–0.70)
Heparin Unfractionated: 0.31 IU/mL (ref 0.30–0.70)
Heparin Unfractionated: 0.36 [IU]/mL (ref 0.30–0.70)

## 2016-09-07 LAB — HEMOGLOBIN A1C
Hgb A1c MFr Bld: 10.6 % — ABNORMAL HIGH (ref 4.8–5.6)
Mean Plasma Glucose: 258 mg/dL

## 2016-09-07 MED ORDER — MAGNESIUM HYDROXIDE 400 MG/5ML PO SUSP
30.0000 mL | Freq: Every day | ORAL | Status: DC | PRN
  Administered 2016-09-08: 30 mL via ORAL
  Filled 2016-09-07: qty 30

## 2016-09-07 NOTE — Progress Notes (Addendum)
ANTICOAGULATION CONSULT NOTE - Follow Up Consult  Pharmacy Consult for Heparin Indication: CAD pending CABG  Allergies  Allergen Reactions  . No Known Allergies     Patient Measurements: Height: '5\' 9"'$  (175.3 cm) Weight: 273 lb 3.2 oz (123.9 kg) IBW/kg (Calculated) : 70.7 Heparin Dosing Weight: 100 kg  Vital Signs: Temp: 98.1 F (36.7 C) (05/19 1348) Temp Source: Oral (05/19 1348) BP: 112/57 (05/19 1348) Pulse Rate: 73 (05/19 1348)  Labs:  Recent Labs  09/05/16 0401  09/06/16 0513 09/06/16 1348 09/07/16 0306 09/07/16 1323  HGB 14.8  --  15.5  --  14.8  --   HCT 45.4  --  46.5  --  44.6  --   PLT 159  --  184  --  175  --   LABPROT  --   --  13.2  --   --   --   INR  --   --  1.00  --   --   --   HEPARINUNFRC 0.41  < >  --  0.35 0.31 0.36  CREATININE 0.96  --  1.02  --  1.00  --   < > = values in this interval not displayed.  Estimated Creatinine Clearance: 111.2 mL/min (by C-G formula based on SCr of 1 mg/dL).   Assessment: 51 yo M heparin for multivessel CAD pending CABG 5/21. Heparin level therapeutic (0.36) on 2000 units/hr.CBC stable. No bleeding noted. Level drawn slightly early after rate change by less than an hour. This level is likely close to steady state.   Goal of Therapy:  Heparin level 0.3-0.7 units/ml Monitor platelets by anticoagulation protocol: Yes   Plan:  Continue heparin at 2000 units/hr Will re-check level in 6 hours to confirm rate change.  Daily heparin level and CBC  Monitor for signs/symptoms of bleeding  Uvaldo Bristle, PharmD PGY1 Pharmacy Resident Pager: [phone] 09/07/2016 3:01 PM  ADDN:  Confirmatory heparin level remains therapeutic at 0.38 on heparin 2000 units/hr. No issues with infusion or bleeding noted. Continue current rate.  Andrey Cota. Diona Foley, PharmD, [facility] Clinical Pharmacist [phone]

## 2016-09-07 NOTE — Progress Notes (Signed)
ANTICOAGULATION CONSULT NOTE - Follow Up Consult  Pharmacy Consult for heparin Indication: CAD awaiting CABG  Labs:  Recent Labs  09/05/16 0401 09/06/16 0505 09/06/16 0513 09/06/16 1348 09/07/16 0306  HGB 14.8  --  15.5  --  14.8  HCT 45.4  --  46.5  --  44.6  PLT 159  --  184  --  175  LABPROT  --   --  13.2  --   --   INR  --   --  1.00  --   --   HEPARINUNFRC 0.41 0.83*  --  0.35 0.31  CREATININE 0.96  --  1.02  --  1.00    Assessment: 51yo male therapeutic on heparin though at low end of goal; rec'd call from RN who reports that per night RN heparin gtt has been running at 1850 units/hr despite order at 2250 units/hr; it is unclear when this rate was changed or whether gtt was ever running at 2250 units/hr.  Goal of Therapy:  Heparin level 0.3-0.7 units/ml   Plan:  Since level is currently at very low end of goal, will increase heparin gtt to 2000 units/hr and check level in [place], PharmD, BCPS  09/07/2016,7:44 AM

## 2016-09-07 NOTE — Progress Notes (Signed)
Progress Note  Patient Name: [name] Date of Encounter: 09/07/2016  Primary Cardiologist: Angelena Form  Subjective   No chest pain or dyspnea. Able to lie fully supine in bed without complaints  Inpatient Medications    Scheduled Meds: . aspirin EC  81 mg Oral Daily  . dolutegravir  50 mg Oral Daily  . emtricitabine-tenofovir AF  1 tablet Oral Daily  . furosemide  80 mg Oral BID  . guaiFENesin  600 mg Oral BID  . insulin aspart  0-15 Units Subcutaneous TID WC  . insulin glargine  10 Units Subcutaneous BID  . metoprolol tartrate  50 mg Oral BID  . mometasone-formoterol  2 puff Inhalation BID  . pravastatin  40 mg Oral QPM  . sertraline  50 mg Oral Daily  . sodium chloride flush  3 mL Intravenous Q12H  . verapamil  120 mg Oral QHS  . zolpidem  10 mg Oral QHS   Continuous Infusions: . sodium chloride    . heparin 2,000 Units/hr (09/07/16 0806)   PRN Meds: sodium chloride, acetaminophen, nitroGLYCERIN, ondansetron (ZOFRAN) IV, sodium chloride flush   Vital Signs    Vitals:   09/06/16 1750 09/06/16 2104 09/07/16 0450 09/07/16 0810  BP: (!) 148/86 107/67 124/82 115/73  Pulse: 75 64 69   Resp: '18 18 20 20  '$ Temp: 98.7 F (37.1 C) 97.5 F (36.4 C) 97.9 F (36.6 C)   TempSrc: Oral Oral Oral   SpO2: 100% 99% 96%   Weight:   273 lb 3.2 oz (123.9 kg)   Height:        Intake/Output Summary (Last 24 hours) at 09/07/16 0939 Last data filed at 09/07/16 0806  Gross per 24 hour  Intake           1547.2 ml  Output             2450 ml  Net           -902.8 ml   Filed Weights   09/05/16 0523 09/06/16 0437 09/07/16 0450  Weight: 272 lb 1.6 oz (123.4 kg) 273 lb 8 oz (124.1 kg) 273 lb 3.2 oz (123.9 kg)    Telemetry    Normal sinus rhythm. One 15 beat episode of paroxysmal atrial tachycardia at 8 PM last night, he was not aware of it. - Personally Reviewed  ECG    Normal sinus rhythm - Personally Reviewed  Physical Exam  Obese. Comfortable. Smiling. GEN: No acute  distress.   Neck: No JVD Cardiac: RRR, no murmurs, rubs, or gallops.  Respiratory: Clear to auscultation bilaterally. GI: Soft, nontender, non-distended  MS: No edema; No deformity. Neuro:  Nonfocal  Psych: Normal affect   Labs    Chemistry Recent Labs Lab 09/02/16 2235  09/05/16 0401 09/06/16 0513 09/07/16 0306  NA 135  < > 136 136 135  K 3.9  < > 3.4* 3.5 3.3*  CL 104  < > 104 102 104  CO2 24  < > 21* 23 23  GLUCOSE 258*  < > 141* 174* 186*  BUN 14  < > '9 10 10  '$ CREATININE 1.01  < > 0.96 1.02 1.00  CALCIUM 9.4  < > 8.7* 9.0 9.1  PROT 6.9  --   --  7.0  --   ALBUMIN 4.0  --   --  4.0  --   AST 38  --   --  53*  --   ALT 59  --   --  79*  --   ALKPHOS 132*  --   --  115  --   BILITOT 0.4  --   --  0.7  --   GFRNONAA >60  < > >60 >60 >60  GFRAA >60  < > >60 >60 >60  ANIONGAP 7  < > '11 11 8  '$ < > = values in this interval not displayed.   Hematology Recent Labs Lab 09/05/16 0401 09/06/16 0513 09/07/16 0306  WBC 6.4 8.6 7.3  RBC 5.30 5.45 5.25  HGB 14.8 15.5 14.8  HCT 45.4 46.5 44.6  MCV 85.7 85.3 85.0  MCH 27.9 28.4 28.2  MCHC 32.6 33.3 33.2  RDW 13.4 13.4 13.2  PLT 159 184 175    Cardiac Enzymes Recent Labs Lab 09/02/16 2235 09/03/16 0553  TROPONINI 0.53* 0.88*   No results for input(s): TROPIPOC in the last 168 hours.   BNPNo results for input(s): BNP, PROBNP in the last 168 hours.   DDimer No results for input(s): DDIMER in the last 168 hours.   Radiology    US Thyroid  Result Date: 09/05/2016 CLINICAL DATA:  Incidental on CT. EXAM: THYROID ULTRASOUND TECHNIQUE: Ultrasound examination of the thyroid gland and adjacent soft tissues was performed. COMPARISON:  None. FINDINGS: Parenchymal Echotexture: Moderately heterogenous Isthmus: 1.3 cm Right lobe: 6.0 x 2.7 x 2.3 cm Left lobe: 6.0 x 3.2 x 1.9 cm _________________________________________________________ Estimated total number of nodules >/= 1 cm: 3 Number of spongiform nodules >/=  2 cm not  described below (TR1): 0 Number of mixed cystic and solid nodules >/= 1.5 cm not described below ([place]): 0 _________________________________________________________ Nodule # 1: Location: Isthmus; Inferior Maximum size: 3.8 cm; Other 2 dimensions: 3.5 x 2.1 cm Composition: solid/almost completely solid (2) Echogenicity: hypoechoic (2) Shape: not taller-than-wide (0) Margins: smooth (0) Echogenic foci: none (0) ACR TI-RADS total points: 4. ACR TI-RADS risk category: TR4 (4-6 points). ACR TI-RADS recommendations: **Given size (>/= 1.5 cm) and appearance, fine needle aspiration of this moderately suspicious nodule should be considered based on TI-RADS criteria. _________________________________________________________ Nodule # 2: Location: Right; Superior Maximum size: 1.7 cm; Other 2 dimensions: 1.5 x 1.0 cm Composition: solid/almost completely solid (2) Echogenicity: hypoechoic (2) Shape: not taller-than-wide (0) Margins: smooth (0) Echogenic foci: none (0) ACR TI-RADS total points: 4. ACR TI-RADS risk category: TR4 (4-6 points). ACR TI-RADS recommendations: **Given size (>/= 1.5 cm) and appearance, fine needle aspiration of this moderately suspicious nodule should be considered based on TI-RADS criteria. _________________________________________________________ Nodule # 3: Location: Right; Mid Maximum size: 1.7 cm; Other 2 dimensions: 1.2 x 1.0 cm Composition: solid/almost completely solid (2) Echogenicity: hypoechoic (2) Shape: not taller-than-wide (0) Margins: smooth (0) Echogenic foci: none (0) ACR TI-RADS total points: 4. ACR TI-RADS risk category: TR4 (4-6 points). ACR TI-RADS recommendations: **Given size (>/= 1.5 cm) and appearance, fine needle aspiration of this moderately suspicious nodule should be considered based on TI-RADS criteria. _________________________________________________________ Tiny left upper pole nodule measures 3 mm. IMPRESSION: Multiple nodules are noted. There are 3 nodules that meet  criteria for fine needle aspiration biopsy. Biopsy is recommended for nodules 1 and 2. The above is in keeping with the ACR TI-RADS recommendations - J Am Coll Radiol 2017;14:587-595. Electronically Signed   By: Marybelle Killings M.D.   On: 09/05/2016 12:10    Cardiac Studies   09/03/16  1. Severe 3 vessel CAD with:   Total occlusion of the mid-LAD collateralized from the distal RCA  Severe circumflex stenosis proximal to the old  stent and moderate in-stent restenosis in the distal part of the stent  Moderate/severe ulcerated proximal RCA stenosis and moderate focal mid-RCA stenosis 2. Probable mild LV systolic dysfunction (will check echo to better evaluate)  Recommend: TCTS consult for CABG in this patient with severe 3 vessel CAD and diabetes  ECHo 09/04/16 - Left ventricle: The cavity size was normal. Wall thickness was   increased in a pattern of mild LVH. Systolic function was normal.   The estimated ejection fraction was in the range of 55% to 60%.   Basal inferolateral and basal inferior hypokinesis. Features are   consistent with a pseudonormal left ventricular filling pattern,   with concomitant abnormal relaxation and increased filling   pressure (grade 2 diastolic dysfunction). - Aortic valve: There was no stenosis. - Aorta: Mildly dilated aortic root. Aortic root dimension: 38 mm   (ED). - Mitral valve: There was trivial regurgitation. - Right ventricle: The cavity size was normal. Systolic function   was normal. - Pulmonary arteries: No complete TR doppler jet so unable to   estimate PA systolic pressure. - Systemic veins: IVC measured 2.0 cm with < 50% respirophasic   variation, suggesting RA pressure 8 mmHg. Patient Profile     51 y.o. male with known coronary artery disease, diabetes mellitus, controlled HIV infection, ulcerative colitis, OSA, smoker presenting with NSTEMI and found to have three-vessel disease with an ulcerated lesion in the right coronary artery.  Plan for multivessel bypass surgery on Monday. Currently on intravenous heparin and asymptomatic.  Assessment & Plan    1. CAD s/p NSTEMI: For CABG on Monday. Currently asymptomatic 2. PAT: Asymptomatic and relatively brief. May have issues with atrial arrhythmia post surgery. 3. OSA: on CPAP 4. Tobacco use: Strongly encouraged smoking cessation. 5. HLP: Deleterious effects of antiretrovirals. No recent lipid profile. He is on a relatively low dose of a week statin. Need to reevaluate.  Signed, Sanda Klein, MD  09/07/2016, 9:39 AM

## 2016-09-08 ENCOUNTER — Encounter (HOSPITAL_COMMUNITY): Payer: Self-pay

## 2016-09-08 DIAGNOSIS — G4733 Obstructive sleep apnea (adult) (pediatric): Secondary | ICD-10-CM

## 2016-09-08 DIAGNOSIS — E782 Mixed hyperlipidemia: Secondary | ICD-10-CM

## 2016-09-08 DIAGNOSIS — Z9989 Dependence on other enabling machines and devices: Secondary | ICD-10-CM

## 2016-09-08 LAB — CBC
HCT: 44.6 % (ref 39.0–52.0)
HEMOGLOBIN: 14.8 g/dL (ref 13.0–17.0)
MCH: 28.2 pg (ref 26.0–34.0)
MCHC: 33.2 g/dL (ref 30.0–36.0)
MCV: 85.1 fL (ref 78.0–100.0)
Platelets: 176 10*3/uL (ref 150–400)
RBC: 5.24 MIL/uL (ref 4.22–5.81)
RDW: 13.3 % (ref 11.5–15.5)
WBC: 9 10*3/uL (ref 4.0–10.5)

## 2016-09-08 LAB — GLUCOSE, CAPILLARY
GLUCOSE-CAPILLARY: 166 mg/dL — AB (ref 65–99)
Glucose-Capillary: 140 mg/dL — ABNORMAL HIGH (ref 65–99)
Glucose-Capillary: 156 mg/dL — ABNORMAL HIGH (ref 65–99)
Glucose-Capillary: 149 mg/dL — ABNORMAL HIGH (ref 65–99)

## 2016-09-08 LAB — BASIC METABOLIC PANEL
ANION GAP: 10 (ref 5–15)
BUN: 13 mg/dL (ref 6–20)
CALCIUM: 9 mg/dL (ref 8.9–10.3)
CHLORIDE: 101 mmol/L (ref 101–111)
CO2: 22 mmol/L (ref 22–32)
CREATININE: 1.16 mg/dL (ref 0.61–1.24)
GFR calc non Af Amer: >60 mL/min (ref >60)
Glucose, Bld: 217 mg/dL — ABNORMAL HIGH (ref 65–99)
Potassium: 3.4 mmol/L — ABNORMAL LOW (ref 3.5–5.1)
Sodium: 133 mmol/L — ABNORMAL LOW (ref 135–145)

## 2016-09-08 LAB — LIPID PANEL
CHOL/HDL RATIO: 4.1 ratio
CHOLESTEROL: 116 mg/dL (ref 0–200)
HDL: 28 mg/dL — ABNORMAL LOW (ref >40)
LDL Cholesterol: 50 mg/dL (ref 0–99)
Triglycerides: 189 mg/dL — ABNORMAL HIGH (ref <150)
VLDL: 38 mg/dL (ref 0–40)

## 2016-09-08 LAB — HEPARIN LEVEL (UNFRACTIONATED): HEPARIN UNFRACTIONATED: 0.44 [IU]/mL (ref 0.30–0.70)

## 2016-09-08 LAB — PREPARE RBC (CROSSMATCH)

## 2016-09-08 MED ORDER — TRANEXAMIC ACID (OHS) PUMP PRIME SOLUTION
2.0000 mg/kg | INTRAVENOUS | Status: DC
  Filled 2016-09-08 (×2): qty 2.48

## 2016-09-08 MED ORDER — SENNOSIDES-DOCUSATE SODIUM 8.6-50 MG PO TABS: 1.0000 | ORAL_TABLET | Freq: Every evening | ORAL | Status: DC | PRN

## 2016-09-08 MED ORDER — TRANEXAMIC ACID (OHS) BOLUS VIA INFUSION
15.0000 mg/kg | INTRAVENOUS | Status: AC
  Administered 2016-09-09: 1860 mg via INTRAVENOUS
  Filled 2016-09-08 (×2): qty 1860

## 2016-09-08 MED ORDER — CHLORHEXIDINE GLUCONATE 0.12 % MT SOLN
15.0000 mL | Freq: Once | OROMUCOSAL | Status: AC
  Administered 2016-09-09: 15 mL via OROMUCOSAL
  Filled 2016-09-08: qty 15

## 2016-09-08 MED ORDER — DEXMEDETOMIDINE HCL IN NACL 400 MCG/100ML IV SOLN
0.1000 ug/kg/h | INTRAVENOUS | Status: DC
  Administered 2016-09-09: 0.7 ug/kg/h via INTRAVENOUS
  Filled 2016-09-08 (×2): qty 100

## 2016-09-08 MED ORDER — PLASMA-LYTE 148 IV SOLN
INTRAVENOUS | Status: AC
  Administered 2016-09-09: 500 mL
  Filled 2016-09-08 (×2): qty 2.5

## 2016-09-08 MED ORDER — HEPARIN SODIUM (PORCINE) 1000 UNIT/ML IJ SOLN
INTRAMUSCULAR | Status: DC
  Filled 2016-09-08 (×2): qty 30

## 2016-09-08 MED ORDER — VANCOMYCIN HCL 10 G IV SOLR
1500.0000 mg | INTRAVENOUS | Status: DC
  Filled 2016-09-08 (×2): qty 1500

## 2016-09-08 MED ORDER — ALPRAZOLAM 0.25 MG PO TABS: 0.2500 mg | ORAL_TABLET | ORAL | Status: DC | PRN

## 2016-09-08 MED ORDER — POTASSIUM CHLORIDE CRYS ER 20 MEQ PO TBCR
40.0000 meq | EXTENDED_RELEASE_TABLET | Freq: Every day | ORAL | Status: DC
  Administered 2016-09-08: 40 meq via ORAL
  Filled 2016-09-08: qty 2

## 2016-09-08 MED ORDER — SODIUM CHLORIDE 0.9 % IV SOLN
30.0000 ug/min | INTRAVENOUS | Status: DC
  Filled 2016-09-08 (×2): qty 2

## 2016-09-08 MED ORDER — MAGNESIUM SULFATE 50 % IJ SOLN
40.0000 meq | INTRAMUSCULAR | Status: DC
  Filled 2016-09-08 (×2): qty 10

## 2016-09-08 MED ORDER — SODIUM CHLORIDE 0.9 % IV SOLN
INTRAVENOUS | Status: DC
  Filled 2016-09-08 (×2): qty 1

## 2016-09-08 MED ORDER — EPINEPHRINE PF 1 MG/ML IJ SOLN
0.0000 ug/min | INTRAVENOUS | Status: DC
  Filled 2016-09-08 (×2): qty 4

## 2016-09-08 MED ORDER — DOPAMINE-DEXTROSE 3.2-5 MG/ML-% IV SOLN
0.0000 ug/kg/min | INTRAVENOUS | Status: AC
  Administered 2016-09-09: 3 ug/kg/min via INTRAVENOUS
  Filled 2016-09-08: qty 250

## 2016-09-08 MED ORDER — POTASSIUM CHLORIDE 2 MEQ/ML IV SOLN
80.0000 meq | INTRAVENOUS | Status: DC
  Filled 2016-09-08 (×2): qty 40

## 2016-09-08 MED ORDER — DEXTROSE 5 % IV SOLN
750.0000 mg | INTRAVENOUS | Status: DC
  Filled 2016-09-08 (×2): qty 750

## 2016-09-08 MED ORDER — BISACODYL 5 MG PO TBEC
5.0000 mg | DELAYED_RELEASE_TABLET | Freq: Once | ORAL | Status: AC
  Administered 2016-09-08: 5 mg via ORAL
  Filled 2016-09-08: qty 1

## 2016-09-08 MED ORDER — CHLORHEXIDINE GLUCONATE 4 % EX LIQD
60.0000 mL | Freq: Once | CUTANEOUS | Status: AC
  Administered 2016-09-08: 4 via TOPICAL
  Filled 2016-09-08: qty 60

## 2016-09-08 MED ORDER — CHLORHEXIDINE GLUCONATE 4 % EX LIQD
60.0000 mL | Freq: Once | CUTANEOUS | Status: AC
  Administered 2016-09-09: 4 via TOPICAL

## 2016-09-08 MED ORDER — METOPROLOL TARTRATE 12.5 MG HALF TABLET
12.5000 mg | ORAL_TABLET | Freq: Once | ORAL | Status: AC
  Administered 2016-09-09: 12.5 mg via ORAL
  Filled 2016-09-08: qty 1

## 2016-09-08 MED ORDER — TRANEXAMIC ACID 1000 MG/10ML IV SOLN
1.5000 mg/kg/h | INTRAVENOUS | Status: AC
  Administered 2016-09-09: 1.5 mg/kg/h via INTRAVENOUS
  Filled 2016-09-08 (×2): qty 25

## 2016-09-08 MED ORDER — DEXTROSE 5 % IV SOLN
1.5000 g | INTRAVENOUS | Status: AC
  Administered 2016-09-09: .75 g via INTRAVENOUS
  Administered 2016-09-09: 1.5 g via INTRAVENOUS
  Filled 2016-09-08 (×2): qty 1.5

## 2016-09-08 MED ORDER — NITROGLYCERIN IN D5W 200-5 MCG/ML-% IV SOLN
2.0000 ug/min | INTRAVENOUS | Status: AC
  Administered 2016-09-09: 5 ug/min via INTRAVENOUS
  Filled 2016-09-08: qty 250

## 2016-09-08 MED ORDER — TEMAZEPAM 15 MG PO CAPS: 15.0000 mg | ORAL_CAPSULE | Freq: Once | ORAL | Status: DC | PRN

## 2016-09-08 MED ORDER — DIAZEPAM 5 MG PO TABS
5.0000 mg | ORAL_TABLET | Freq: Once | ORAL | Status: AC
  Administered 2016-09-09: 5 mg via ORAL
  Filled 2016-09-08: qty 1

## 2016-09-08 NOTE — Progress Notes (Signed)
ANTICOAGULATION CONSULT NOTE - Follow Up Consult  Pharmacy Consult for Heparin Indication: CAD pending CABG  Allergies  Allergen Reactions  . No Known Allergies     Patient Measurements: Height: '5\' 9"'$  (175.3 cm) Weight: 273 lb 6.4 oz (124 kg) IBW/kg (Calculated) : 70.7 Heparin Dosing Weight: 100 kg  Vital Signs: Temp: 98.4 F (36.9 C) (05/20 0501) BP: 112/71 (05/20 0501) Pulse Rate: 67 (05/20 0501)  Labs:  Recent Labs  09/06/16 0513  09/07/16 0306 09/07/16 1323 09/07/16 1932 09/08/16 0249  HGB 15.5  --  14.8  --   --  14.8  HCT 46.5  --  44.6  --   --  44.6  PLT 184  --  175  --   --  176  LABPROT 13.2  --   --   --   --   --   INR 1.00  --   --   --   --   --   HEPARINUNFRC  --   < > 0.31 0.36 0.38 0.44  CREATININE 1.02  --  1.00  --   --  1.16  < > = values in this interval not displayed.  Estimated Creatinine Clearance: 95.8 mL/min (by C-G formula based on SCr of 1.16 mg/dL).   Assessment: 51 yo M heparin for multivessel CAD pending CABG 5/21. Heparin level therapeutic (0.44) on 2000 units/hr.CBC stable. No bleeding noted in chart. Patient now therapeutic X 3 on 2000 units/hr.   Goal of Therapy:  Heparin level 0.3-0.7 units/ml Monitor platelets by anticoagulation protocol: Yes   Plan:  Continue heparin at 2000 units/hr Daily heparin level and CBC  Monitor for signs/symptoms of bleeding CABG 5/21  Uvaldo Bristle, PharmD PGY1 Pharmacy Resident Pager: [phone] 09/08/2016 8:06 AM

## 2016-09-08 NOTE — Anesthesia Preprocedure Evaluation (Addendum)
Anesthesia Evaluation  Patient identified by MRN, date of birth, ID band Patient awake    Reviewed: Allergy & Precautions, NPO status , Patient's Chart, lab work & pertinent test results  Airway Mallampati: III  TM Distance: >3 FB Neck ROM: Full    Dental  (+) Dental Advisory Given   Pulmonary asthma , sleep apnea , Current Smoker,    breath sounds clear to auscultation       Cardiovascular hypertension, Pt. on medications + CAD, + Past MI and + Cardiac Stents   Rhythm:Regular Rate:Normal  Normal LV size with mild LV hypertrophy. EF 55-60% with wall motion abnormalities as noted above. Moderate diastolic dysfunction. Normal RV size and systolic function. No significant valvular abnormalities.   Neuro/Psych negative neurological ROS     GI/Hepatic negative GI ROS, (+) Hepatitis -  Endo/Other  negative endocrine ROS  Renal/GU Renal disease     Musculoskeletal   Abdominal   Peds  Hematology  (+) HIV,   Anesthesia Other Findings   Reproductive/Obstetrics                            Lab Results  Component Value Date   WBC 9.0 09/08/2016   HGB 14.8 09/08/2016   HCT 44.6 09/08/2016   MCV 85.1 09/08/2016   PLT 176 09/08/2016   Lab Results  Component Value Date   CREATININE 1.16 09/08/2016   BUN 13 09/08/2016   NA 133 (L) 09/08/2016   K 3.4 (L) 09/08/2016   CL 101 09/08/2016   CO2 22 09/08/2016    Anesthesia Physical Anesthesia Plan  ASA: IV  Anesthesia Plan: General   Post-op Pain Management:    Induction: Intravenous  Airway Management Planned: Oral ETT  Additional Equipment: Ultrasound Guidance Line Placement, PA Cath, CVP, Arterial line and TEE  Intra-op Plan:   Post-operative Plan: Post-operative intubation/ventilation  Informed Consent: I have reviewed the patients History and Physical, chart, labs and discussed the procedure including the risks, benefits and  alternatives for the proposed anesthesia with the patient or authorized representative who has indicated his/her understanding and acceptance.   Dental advisory given  Plan Discussed with: CRNA  Anesthesia Plan Comments:        Anesthesia Quick Evaluation

## 2016-09-08 NOTE — Progress Notes (Signed)
5 Days Post-Op Procedure(s) (LRB): Left Heart Cath and Coronary Angiography (N/A) Subjective: Patient feels well, denies angina Platelet function assay P2 Y 12 shows improvement over earlier in the week Infectious disease has reviewed patient's HIV treatment Plan multivessel CABG in a.m. Procedures indications benefits and risks have been discussed with the patient  Objective: Vital signs in last 24 hours: Temp:  [98.1 F (36.7 C)-98.4 F (36.9 C)] 98.4 F (36.9 C) (05/20 0501) Pulse Rate:  [60-73] 67 (05/20 0501) Cardiac Rhythm: Normal sinus rhythm (05/20 0749) Resp:  [17-25] 17 (05/20 0501) BP: (103-112)/(57-71) 112/71 (05/20 0501) SpO2:  [97 %-98 %] 97 % (05/20 0815) Weight:  [273 lb 6.4 oz (124 kg)] 273 lb 6.4 oz (124 kg) (05/20 0501)  Hemodynamic parameters for last 24 hours:    Intake/Output from previous day: 05/19 0701 - 05/20 0700 In: 1079.9 [P.O.:600; I.V.:479.9] Out: 2425 [Urine:2425] Intake/Output this shift: Total I/O In: 140 [I.V.:140] Out: -        Exam    General- alert and comfortable   Lungs- clear without rales, wheezes   Cor- regular rate and rhythm, no murmur , gallop   Abdomen- soft, non-tender   Extremities - warm, non-tender, minimal edema   Neuro- oriented, appropriate, no focal weakness   Lab Results:  Recent Labs  09/07/16 0306 09/08/16 0249  WBC 7.3 9.0  HGB 14.8 14.8  HCT 44.6 44.6  PLT 175 176   BMET:  Recent Labs  09/07/16 0306 09/08/16 0249  NA 135 133*  K 3.3* 3.4*  CL 104 101  CO2 23 22  GLUCOSE 186* 217*  BUN 10 13  CREATININE 1.00 1.16  CALCIUM 9.1 9.0    PT/INR:  Recent Labs  09/06/16 0513  LABPROT 13.2  INR 1.00   ABG    Component Value Date/Time   PHART 7.412 09/05/2016 1230   HCO3 22.0 09/05/2016 1230   ACIDBASEDEF 1.9 09/05/2016 1230   O2SAT 96.0 09/05/2016 1230   CBG (last 3)   Recent Labs  09/07/16 2133 09/08/16 0718 09/08/16 1140  GLUCAP 157* 140* 166*    Assessment/Plan: S/P  Procedure(s) (LRB): Left Heart Cath and Coronary Angiography (N/A) CABG in a.m. Orders have been placed   LOS: 6 days    Tharon Aquas Trigt III 09/08/2016

## 2016-09-08 NOTE — Progress Notes (Signed)
Progress Note  Patient Name: [name] Date of Encounter: 09/08/2016  Primary Cardiologist: Dr. Darlina Guys  Subjective   No chest pain or shortness of breath at rest. Palpitations.  Inpatient Medications    Scheduled Meds: . aspirin EC  81 mg Oral Daily  . dolutegravir  50 mg Oral Daily  . emtricitabine-tenofovir AF  1 tablet Oral Daily  . furosemide  80 mg Oral BID  . guaiFENesin  600 mg Oral BID  . insulin aspart  0-15 Units Subcutaneous TID WC  . insulin glargine  10 Units Subcutaneous BID  . metoprolol tartrate  50 mg Oral BID  . mometasone-formoterol  2 puff Inhalation BID  . potassium chloride  40 mEq Oral Daily  . pravastatin  40 mg Oral QPM  . sertraline  50 mg Oral Daily  . sodium chloride flush  3 mL Intravenous Q12H  . verapamil  120 mg Oral QHS  . zolpidem  10 mg Oral QHS   Continuous Infusions: . sodium chloride    . heparin 2,000 Units/hr (09/08/16 0905)   PRN Meds: sodium chloride, acetaminophen, magnesium hydroxide, nitroGLYCERIN, ondansetron (ZOFRAN) IV, senna-docusate, sodium chloride flush   Vital Signs    Vitals:   09/07/16 2003 09/07/16 2100 09/08/16 0501 09/08/16 0815  BP:  (!) 103/57 112/71   Pulse:  60 67   Resp:  (!) 25 17   Temp:  98.3 F (36.8 C) 98.4 F (36.9 C)   TempSrc:      SpO2: 98% 98% 97% 97%  Weight:   273 lb 6.4 oz (124 kg)   Height:        Intake/Output Summary (Last 24 hours) at 09/08/16 1116 Last data filed at 09/08/16 0536  Gross per 24 hour  Intake           479.85 ml  Output             2425 ml  Net         -1945.15 ml   Filed Weights   09/06/16 0437 09/07/16 0450 09/08/16 0501  Weight: 273 lb 8 oz (124.1 kg) 273 lb 3.2 oz (123.9 kg) 273 lb 6.4 oz (124 kg)    Telemetry    Sinus rhythm with rare PACs. Personally reviewed.  Physical Exam   GEN: Overweight male, No acute distress.   Neck: No JVD. Cardiac: RRR, no murmur, rub, or gallop.  Respiratory: Nonlabored. Clear to auscultation  bilaterally. GI: Soft, nontender, bowel sounds present. MS: No edema; No deformity.  Labs    Chemistry Recent Labs Lab 09/02/16 2235  09/06/16 0513 09/07/16 0306 09/08/16 0249  NA 135  < > 136 135 133*  K 3.9  < > 3.5 3.3* 3.4*  CL 104  < > 102 104 101  CO2 24  < > '23 23 22  '$ GLUCOSE 258*  < > 174* 186* 217*  BUN 14  < > '10 10 13  '$ CREATININE 1.01  < > 1.02 1.00 1.16  CALCIUM 9.4  < > 9.0 9.1 9.0  PROT 6.9  --  7.0  --   --   ALBUMIN 4.0  --  4.0  --   --   AST 38  --  53*  --   --   ALT 59  --  79*  --   --   ALKPHOS 132*  --  115  --   --   BILITOT 0.4  --  0.7  --   --  GFRNONAA >60  < > >60 >60 >60  GFRAA >60  < > >60 >60 >60  ANIONGAP 7  < > '11 8 10  '$ < > = values in this interval not displayed.   Hematology  Recent Labs Lab 09/06/16 0513 09/07/16 0306 09/08/16 0249  WBC 8.6 7.3 9.0  RBC 5.45 5.25 5.24  HGB 15.5 14.8 14.8  HCT 46.5 44.6 44.6  MCV 85.3 85.0 85.1  MCH 28.4 28.2 28.2  MCHC 33.3 33.2 33.2  RDW 13.4 13.2 13.3  PLT 184 175 176    Cardiac Enzymes  Recent Labs Lab 09/02/16 2235 09/03/16 0553  TROPONINI 0.53* 0.88*   No results for input(s): TROPIPOC in the last 168 hours.   Radiology    No results found.  Cardiac Studies   Echocardiogram 09/04/2016: Study Conclusions  - Left ventricle: The cavity size was normal. Wall thickness was   increased in a pattern of mild LVH. Systolic function was normal.   The estimated ejection fraction was in the range of 55% to 60%.   Basal inferolateral and basal inferior hypokinesis. Features are   consistent with a pseudonormal left ventricular filling pattern,   with concomitant abnormal relaxation and increased filling   pressure (grade 2 diastolic dysfunction). - Aortic valve: There was no stenosis. - Aorta: Mildly dilated aortic root. Aortic root dimension: 38 mm   (ED). - Mitral valve: There was trivial regurgitation. - Right ventricle: The cavity size was normal. Systolic function   was  normal. - Pulmonary arteries: No complete TR doppler jet so unable to   estimate PA systolic pressure. - Systemic veins: IVC measured 2.0 cm with < 50% respirophasic   variation, suggesting RA pressure 8 mmHg.  Impressions:  - Normal LV size with mild LV hypertrophy. EF 55-60% with wall   motion abnormalities as noted above. Moderate diastolic   dysfunction. Normal RV size and systolic function. No significant   valvular abnormalities.  Cardiac catheterization 09/03/2016:  1. Severe 3 vessel CAD with:   Total occlusion of the mid-LAD collateralized from the distal RCA  Severe circumflex stenosis proximal to the old stent and moderate in-stent restenosis in the distal part of the stent  Moderate/severe ulcerated proximal RCA stenosis and moderate focal mid-RCA stenosis 2. Probable mild LV systolic dysfunction (will check echo to better evaluate)  Recommend: TCTS consult for CABG in this patient with severe 3 vessel CAD and diabetes  Patient Profile     51 y.o. male with type 2 diabetes mellitus, HIV on antiretroviral therapy, also colitis, OSA, and tobacco use now presenting with an NSTEMI and found to have progressive multivessel CAD with plan for CABG on Monday.  Assessment & Plan    1. NSTEMI. Peak troponin I 0.88. No active chest pain. Continues on heparin.  2. Multivessel CAD with plan for CABG on Monday. Continue aspirin, Lopressor, and Pravachol.  3. Hyperlipidemia, on Pravachol. Recent follow-up lipid panel shows LDL 50. Triglycerides mildly elevated.  4. PAT, no recent recurrence with only rare PACs by telemetry.  5. Tobacco abuse, smoking cessation recommended.  6. OSA, on CPAP.  Continue with current medical regimen. Patient awaits CABG tomorrow morning.  Signed, Rozann Lesches, MD  09/08/2016, 11:16 AM

## 2016-09-09 ENCOUNTER — Inpatient Hospital Stay (HOSPITAL_COMMUNITY): Payer: 59

## 2016-09-09 ENCOUNTER — Inpatient Hospital Stay (HOSPITAL_COMMUNITY): Payer: 59 | Admitting: Certified Registered"

## 2016-09-09 ENCOUNTER — Inpatient Hospital Stay (HOSPITAL_COMMUNITY)
Admission: EM | Disposition: A | Discharge status: Home / Self Care | Payer: Self-pay | Source: Home / Self Care | Attending: Cardiothoracic Surgery

## 2016-09-09 DIAGNOSIS — Z951 Presence of aortocoronary bypass graft: Secondary | ICD-10-CM

## 2016-09-09 HISTORY — PX: TEE WITHOUT CARDIOVERSION: SHX5443

## 2016-09-09 HISTORY — PX: VIDEO BRONCHOSCOPY: SHX5072

## 2016-09-09 HISTORY — PX: CORONARY ARTERY BYPASS GRAFT: SHX141

## 2016-09-09 LAB — POCT I-STAT 3, ART BLOOD GAS (G3+)
ACID-BASE EXCESS: 1 mmol/L (ref 0.0–2.0)
Acid-base deficit: 2 mmol/L (ref 0.0–2.0)
Acid-base deficit: 3 mmol/L — ABNORMAL HIGH (ref 0.0–2.0)
Acid-base deficit: 2 mmol/L (ref 0.0–2.0)
Acid-base deficit: 4 mmol/L — ABNORMAL HIGH (ref 0.0–2.0)
Acid-base deficit: 3 mmol/L — ABNORMAL HIGH (ref 0.0–2.0)
BICARBONATE: 22.3 mmol/L (ref 20.0–28.0)
Bicarbonate: 27.7 mmol/L (ref 20.0–28.0)
Bicarbonate: 24.4 mmol/L (ref 20.0–28.0)
Bicarbonate: 21 mmol/L (ref 20.0–28.0)
Bicarbonate: 21.4 mmol/L (ref 20.0–28.0)
Bicarbonate: 20.8 mmol/L (ref 20.0–28.0)
O2 SAT: 95 %
O2 SAT: 94 %
O2 SAT: 96 %
O2 SAT: 94 %
O2 Saturation: 100 %
O2 Saturation: 94 %
PCO2 ART: 46.1 mmHg (ref 32.0–48.0)
PCO2 ART: 38.6 mmHg (ref 32.0–48.0)
PCO2 ART: 35.1 mmHg (ref 32.0–48.0)
PCO2 ART: 38.5 mmHg (ref 32.0–48.0)
PCO2 ART: 35.3 mmHg (ref 32.0–48.0)
PH ART: 7.331 — AB (ref 7.350–7.450)
PO2 ART: 402 mmHg — AB (ref 83.0–108.0)
PO2 ART: 80 mmHg — AB (ref 83.0–108.0)
PO2 ART: 73 mmHg — AB (ref 83.0–108.0)
PO2 ART: 77 mmHg — AB (ref 83.0–108.0)
PO2 ART: 75 mmHg — AB (ref 83.0–108.0)
Patient temperature: 37
Patient temperature: 38.4
Patient temperature: 38.5
Patient temperature: 38.3
TCO2: 29 mmol/L (ref 0–100)
TCO2: 26 mmol/L (ref 0–100)
TCO2: 24 mmol/L (ref 0–100)
TCO2: 22 mmol/L (ref 0–100)
TCO2: 22 mmol/L (ref 0–100)
TCO2: 22 mmol/L (ref 0–100)
pCO2 arterial: 52.6 mmHg — ABNORMAL HIGH (ref 32.0–48.0)
pH, Arterial: 7.33 — ABNORMAL LOW (ref 7.350–7.450)
pH, Arterial: 7.371 (ref 7.350–7.450)
pH, Arterial: 7.351 (ref 7.350–7.450)
pH, Arterial: 7.399 (ref 7.350–7.450)
pH, Arterial: 7.384 (ref 7.350–7.450)
pO2, Arterial: 92 mmHg (ref 83.0–108.0)

## 2016-09-09 LAB — POCT I-STAT, CHEM 8
BUN: 16 mg/dL (ref 6–20)
BUN: 16 mg/dL (ref 6–20)
BUN: 14 mg/dL (ref 6–20)
BUN: 12 mg/dL (ref 6–20)
BUN: 14 mg/dL (ref 6–20)
BUN: 13 mg/dL (ref 6–20)
BUN: 15 mg/dL (ref 6–20)
BUN: 10 mg/dL (ref 6–20)
CALCIUM ION: 1.22 mmol/L (ref 1.15–1.40)
CALCIUM ION: 1.17 mmol/L (ref 1.15–1.40)
CHLORIDE: 102 mmol/L (ref 101–111)
CHLORIDE: 103 mmol/L (ref 101–111)
CREATININE: 0.8 mg/dL (ref 0.61–1.24)
CREATININE: 0.8 mg/dL (ref 0.61–1.24)
CREATININE: 0.9 mg/dL (ref 0.61–1.24)
Calcium, Ion: 1.24 mmol/L (ref 1.15–1.40)
Calcium, Ion: 1.11 mmol/L — ABNORMAL LOW (ref 1.15–1.40)
Calcium, Ion: 1.01 mmol/L — ABNORMAL LOW (ref 1.15–1.40)
Calcium, Ion: 1.07 mmol/L — ABNORMAL LOW (ref 1.15–1.40)
Calcium, Ion: 1.11 mmol/L — ABNORMAL LOW (ref 1.15–1.40)
Calcium, Ion: 1.16 mmol/L (ref 1.15–1.40)
Chloride: 101 mmol/L (ref 101–111)
Chloride: 103 mmol/L (ref 101–111)
Chloride: 98 mmol/L — ABNORMAL LOW (ref 101–111)
Chloride: 100 mmol/L — ABNORMAL LOW (ref 101–111)
Chloride: 101 mmol/L (ref 101–111)
Chloride: 104 mmol/L (ref 101–111)
Creatinine, Ser: 1 mg/dL (ref 0.61–1.24)
Creatinine, Ser: 1 mg/dL (ref 0.61–1.24)
Creatinine, Ser: 0.7 mg/dL (ref 0.61–1.24)
Creatinine, Ser: 0.8 mg/dL (ref 0.61–1.24)
Creatinine, Ser: 0.8 mg/dL (ref 0.61–1.24)
GLUCOSE: 146 mg/dL — AB (ref 65–99)
GLUCOSE: 142 mg/dL — AB (ref 65–99)
Glucose, Bld: 140 mg/dL — ABNORMAL HIGH (ref 65–99)
Glucose, Bld: 130 mg/dL — ABNORMAL HIGH (ref 65–99)
Glucose, Bld: 151 mg/dL — ABNORMAL HIGH (ref 65–99)
Glucose, Bld: 167 mg/dL — ABNORMAL HIGH (ref 65–99)
Glucose, Bld: 175 mg/dL — ABNORMAL HIGH (ref 65–99)
Glucose, Bld: 209 mg/dL — ABNORMAL HIGH (ref 65–99)
HCT: 42 % (ref 39.0–52.0)
HCT: 39 % (ref 39.0–52.0)
HCT: 44 % (ref 39.0–52.0)
HEMATOCRIT: 33 % — AB (ref 39.0–52.0)
HEMATOCRIT: 31 % — AB (ref 39.0–52.0)
HEMATOCRIT: 32 % — AB (ref 39.0–52.0)
HEMATOCRIT: 31 % — AB (ref 39.0–52.0)
HEMATOCRIT: 37 % — AB (ref 39.0–52.0)
HEMOGLOBIN: 11.2 g/dL — AB (ref 13.0–17.0)
HEMOGLOBIN: 10.5 g/dL — AB (ref 13.0–17.0)
HEMOGLOBIN: 10.9 g/dL — AB (ref 13.0–17.0)
HEMOGLOBIN: 10.5 g/dL — AB (ref 13.0–17.0)
HEMOGLOBIN: 15 g/dL (ref 13.0–17.0)
Hemoglobin: 14.3 g/dL (ref 13.0–17.0)
Hemoglobin: 13.3 g/dL (ref 13.0–17.0)
Hemoglobin: 12.6 g/dL — ABNORMAL LOW (ref 13.0–17.0)
POTASSIUM: 4.4 mmol/L (ref 3.5–5.1)
POTASSIUM: 3.4 mmol/L — AB (ref 3.5–5.1)
POTASSIUM: 4 mmol/L (ref 3.5–5.1)
POTASSIUM: 4.4 mmol/L (ref 3.5–5.1)
Potassium: 3.6 mmol/L (ref 3.5–5.1)
Potassium: 3.7 mmol/L (ref 3.5–5.1)
Potassium: 3.5 mmol/L (ref 3.5–5.1)
Potassium: 3.9 mmol/L (ref 3.5–5.1)
SODIUM: 138 mmol/L (ref 135–145)
SODIUM: 138 mmol/L (ref 135–145)
SODIUM: 137 mmol/L (ref 135–145)
SODIUM: 138 mmol/L (ref 135–145)
SODIUM: 139 mmol/L (ref 135–145)
SODIUM: 140 mmol/L (ref 135–145)
Sodium: 137 mmol/L (ref 135–145)
Sodium: 138 mmol/L (ref 135–145)
TCO2: 27 mmol/L (ref 0–100)
TCO2: 27 mmol/L (ref 0–100)
TCO2: 29 mmol/L (ref 0–100)
TCO2: 27 mmol/L (ref 0–100)
TCO2: 27 mmol/L (ref 0–100)
TCO2: 25 mmol/L (ref 0–100)
TCO2: 27 mmol/L (ref 0–100)
TCO2: 22 mmol/L (ref 0–100)

## 2016-09-09 LAB — BASIC METABOLIC PANEL
Anion gap: 9 (ref 5–15)
BUN: 15 mg/dL (ref 6–20)
CO2: 23 mmol/L (ref 22–32)
Calcium: 9.2 mg/dL (ref 8.9–10.3)
Chloride: 102 mmol/L (ref 101–111)
Creatinine, Ser: 1.13 mg/dL (ref 0.61–1.24)
GFR calc Af Amer: >60 mL/min (ref >60)
GFR calc non Af Amer: >60 mL/min (ref >60)
Glucose, Bld: 151 mg/dL — ABNORMAL HIGH (ref 65–99)
Potassium: 3.5 mmol/L (ref 3.5–5.1)
Sodium: 134 mmol/L — ABNORMAL LOW (ref 135–145)

## 2016-09-09 LAB — CBC
HCT: 46.3 % (ref 39.0–52.0)
HCT: 43.4 % (ref 39.0–52.0)
HEMATOCRIT: 43 % (ref 39.0–52.0)
HEMOGLOBIN: 15.4 g/dL (ref 13.0–17.0)
Hemoglobin: 14.4 g/dL (ref 13.0–17.0)
Hemoglobin: 14.5 g/dL (ref 13.0–17.0)
MCH: 28.3 pg (ref 26.0–34.0)
MCH: 28.5 pg (ref 26.0–34.0)
MCH: 28.3 pg (ref 26.0–34.0)
MCHC: 33.3 g/dL (ref 30.0–36.0)
MCHC: 33.5 g/dL (ref 30.0–36.0)
MCHC: 33.4 g/dL (ref 30.0–36.0)
MCV: 85.1 fL (ref 78.0–100.0)
MCV: 85 fL (ref 78.0–100.0)
MCV: 84.8 fL (ref 78.0–100.0)
Platelets: 192 10*3/uL (ref 150–400)
Platelets: 133 10*3/uL — ABNORMAL LOW (ref 150–400)
Platelets: 138 10*3/uL — ABNORMAL LOW (ref 150–400)
RBC: 5.44 MIL/uL (ref 4.22–5.81)
RBC: 5.06 MIL/uL (ref 4.22–5.81)
RBC: 5.12 MIL/uL (ref 4.22–5.81)
RDW: 13.3 % (ref 11.5–15.5)
RDW: 13.3 % (ref 11.5–15.5)
RDW: 13.2 % (ref 11.5–15.5)
WBC: 11 10*3/uL — AB (ref 4.0–10.5)
WBC: 17.9 10*3/uL — ABNORMAL HIGH (ref 4.0–10.5)
WBC: 14.9 10*3/uL — ABNORMAL HIGH (ref 4.0–10.5)

## 2016-09-09 LAB — HEPARIN LEVEL (UNFRACTIONATED): HEPARIN UNFRACTIONATED: 0.52 [IU]/mL (ref 0.30–0.70)

## 2016-09-09 LAB — GLUCOSE, CAPILLARY
GLUCOSE-CAPILLARY: 151 mg/dL — AB (ref 65–99)
GLUCOSE-CAPILLARY: 126 mg/dL — AB (ref 65–99)
GLUCOSE-CAPILLARY: 138 mg/dL — AB (ref 65–99)
Glucose-Capillary: 110 mg/dL — ABNORMAL HIGH (ref 65–99)
Glucose-Capillary: 139 mg/dL — ABNORMAL HIGH (ref 65–99)
Glucose-Capillary: 114 mg/dL — ABNORMAL HIGH (ref 65–99)

## 2016-09-09 LAB — POCT I-STAT 4, (NA,K, GLUC, HGB,HCT)
Glucose, Bld: 135 mg/dL — ABNORMAL HIGH (ref 65–99)
HCT: 43 % (ref 39.0–52.0)
Hemoglobin: 14.6 g/dL (ref 13.0–17.0)
POTASSIUM: 3.8 mmol/L (ref 3.5–5.1)
SODIUM: 141 mmol/L (ref 135–145)

## 2016-09-09 LAB — CREATININE, SERUM
Creatinine, Ser: 0.88 mg/dL (ref 0.61–1.24)
GFR calc Af Amer: >60 mL/min (ref >60)
GFR calc non Af Amer: >60 mL/min (ref >60)

## 2016-09-09 LAB — APTT: aPTT: 34 seconds (ref 24–36)

## 2016-09-09 LAB — PROTIME-INR
INR: 1.16
Prothrombin Time: 14.9 seconds (ref 11.4–15.2)

## 2016-09-09 LAB — MAGNESIUM: Magnesium: 2.8 mg/dL — ABNORMAL HIGH (ref 1.7–2.4)

## 2016-09-09 LAB — HEMOGLOBIN AND HEMATOCRIT, BLOOD
HCT: 33.1 % — ABNORMAL LOW (ref 39.0–52.0)
Hemoglobin: 11.1 g/dL — ABNORMAL LOW (ref 13.0–17.0)

## 2016-09-09 LAB — PLATELET COUNT: Platelets: 129 10*3/uL — ABNORMAL LOW (ref 150–400)

## 2016-09-09 SURGERY — CORONARY ARTERY BYPASS GRAFTING (CABG)
Anesthesia: General | Site: Chest

## 2016-09-09 MED ORDER — ACETAMINOPHEN 160 MG/5ML PO SOLN: 1000.0000 mg | Freq: Four times a day (QID) | ORAL | Status: AC

## 2016-09-09 MED ORDER — ACETAMINOPHEN 500 MG PO TABS
1000.0000 mg | ORAL_TABLET | Freq: Four times a day (QID) | ORAL | Status: AC
  Administered 2016-09-10 – 2016-09-14 (×19): 1000 mg via ORAL
  Filled 2016-09-09 (×17): qty 2

## 2016-09-09 MED ORDER — HEPARIN SODIUM (PORCINE) 1000 UNIT/ML IJ SOLN
INTRAMUSCULAR | Status: DC | PRN
  Administered 2016-09-09: 31000 [IU] via INTRAVENOUS
  Administered 2016-09-09 (×2): 2000 [IU] via INTRAVENOUS

## 2016-09-09 MED ORDER — BUDESONIDE 0.5 MG/2ML IN SUSP
0.5000 mg | Freq: Two times a day (BID) | RESPIRATORY_TRACT | Status: DC
  Administered 2016-09-09 – 2016-09-19 (×18): 0.5 mg via RESPIRATORY_TRACT
  Filled 2016-09-09 (×20): qty 2

## 2016-09-09 MED ORDER — 0.9 % SODIUM CHLORIDE (POUR BTL) OPTIME
TOPICAL | Status: DC | PRN
  Administered 2016-09-09: 5000 mL

## 2016-09-09 MED ORDER — SODIUM CHLORIDE 0.9 % IJ SOLN
OROMUCOSAL | Status: DC | PRN
  Administered 2016-09-09 (×3): 4 mL via TOPICAL

## 2016-09-09 MED ORDER — HEPARIN SODIUM (PORCINE) 1000 UNIT/ML IJ SOLN
INTRAMUSCULAR | Status: AC
  Filled 2016-09-09: qty 1

## 2016-09-09 MED ORDER — ONDANSETRON HCL 4 MG/2ML IJ SOLN
4.0000 mg | Freq: Four times a day (QID) | INTRAMUSCULAR | Status: DC | PRN
  Administered 2016-09-10 – 2016-09-15 (×2): 4 mg via INTRAVENOUS
  Filled 2016-09-09 (×3): qty 2

## 2016-09-09 MED ORDER — HEMOSTATIC AGENTS (NO CHARGE) OPTIME
TOPICAL | Status: DC | PRN
  Administered 2016-09-09: 1 via TOPICAL

## 2016-09-09 MED ORDER — MORPHINE SULFATE (PF) 4 MG/ML IV SOLN
2.0000 mg | INTRAVENOUS | Status: DC | PRN
  Administered 2016-09-10: 4 mg via INTRAVENOUS
  Administered 2016-09-10: 2 mg via INTRAVENOUS
  Administered 2016-09-10 – 2016-09-11 (×4): 4 mg via INTRAVENOUS
  Filled 2016-09-09 (×7): qty 1

## 2016-09-09 MED ORDER — BISACODYL 5 MG PO TBEC
10.0000 mg | DELAYED_RELEASE_TABLET | Freq: Every day | ORAL | Status: DC
  Administered 2016-09-10 – 2016-09-15 (×6): 10 mg via ORAL
  Filled 2016-09-09 (×6): qty 2

## 2016-09-09 MED ORDER — LACTATED RINGERS IV SOLN
INTRAVENOUS | Status: DC | PRN
  Administered 2016-09-09: 07:00:00 via INTRAVENOUS

## 2016-09-09 MED ORDER — DEXMEDETOMIDINE HCL 200 MCG/2ML IV SOLN: 0.0000 ug/kg/h | INTRAVENOUS | Status: DC

## 2016-09-09 MED ORDER — ACETAMINOPHEN 650 MG RE SUPP
650.0000 mg | Freq: Once | RECTAL | Status: AC
  Administered 2016-09-09: 650 mg via RECTAL

## 2016-09-09 MED ORDER — FAMOTIDINE IN NACL 20-0.9 MG/50ML-% IV SOLN
20.0000 mg | Freq: Two times a day (BID) | INTRAVENOUS | Status: AC
  Administered 2016-09-09: 20 mg via INTRAVENOUS

## 2016-09-09 MED ORDER — LIDOCAINE HCL (CARDIAC) 20 MG/ML IV SOLN
INTRAVENOUS | Status: DC | PRN
  Administered 2016-09-09: 100 mg via INTRAVENOUS

## 2016-09-09 MED ORDER — MORPHINE SULFATE (PF) 2 MG/ML IV SOLN
2.0000 mg | INTRAVENOUS | Status: DC | PRN
Start: 2016-09-09 — End: 2016-09-09

## 2016-09-09 MED ORDER — FENTANYL CITRATE (PF) 250 MCG/5ML IJ SOLN
INTRAMUSCULAR | Status: AC
Start: 2016-09-09 — End: 2016-09-09
  Filled 2016-09-09: qty 5

## 2016-09-09 MED ORDER — CALCIUM CHLORIDE 10 % IV SOLN
INTRAVENOUS | Status: AC
  Filled 2016-09-09: qty 20

## 2016-09-09 MED ORDER — SODIUM CHLORIDE 0.9 % IV SOLN
0.0000 ug/min | INTRAVENOUS | Status: DC
  Filled 2016-09-09: qty 2

## 2016-09-09 MED ORDER — PRAVASTATIN SODIUM 40 MG PO TABS
40.0000 mg | ORAL_TABLET | Freq: Every evening | ORAL | Status: DC
  Administered 2016-09-10 – 2016-09-18 (×9): 40 mg via ORAL
  Filled 2016-09-09 (×10): qty 1

## 2016-09-09 MED ORDER — VANCOMYCIN HCL 1000 MG IV SOLR
INTRAVENOUS | Status: DC | PRN
  Administered 2016-09-09: 1500 mg via INTRAVENOUS

## 2016-09-09 MED ORDER — ROCURONIUM BROMIDE 100 MG/10ML IV SOLN
INTRAVENOUS | Status: DC | PRN
  Administered 2016-09-09: 50 mg via INTRAVENOUS
  Administered 2016-09-09: 100 mg via INTRAVENOUS
  Administered 2016-09-09: 50 mg via INTRAVENOUS
  Administered 2016-09-09: 100 mg via INTRAVENOUS
  Administered 2016-09-09: 50 mg via INTRAVENOUS

## 2016-09-09 MED ORDER — SODIUM CHLORIDE 0.9 % IV SOLN
INTRAVENOUS | Status: AC
  Filled 2016-09-09 (×2): qty 1

## 2016-09-09 MED ORDER — SODIUM CHLORIDE 0.45 % IV SOLN
INTRAVENOUS | Status: DC | PRN
  Administered 2016-09-09: 16:00:00 via INTRAVENOUS

## 2016-09-09 MED ORDER — LIDOCAINE 2% (20 MG/ML) 5 ML SYRINGE
INTRAMUSCULAR | Status: AC
  Filled 2016-09-09: qty 5

## 2016-09-09 MED ORDER — BISACODYL 10 MG RE SUPP: 10.0000 mg | Freq: Every day | RECTAL | Status: DC

## 2016-09-09 MED ORDER — MORPHINE SULFATE (PF) 4 MG/ML IV SOLN: 1.0000 mg | INTRAVENOUS | Status: AC | PRN

## 2016-09-09 MED ORDER — ALBUMIN HUMAN 5 % IV SOLN
250.0000 mL | INTRAVENOUS | Status: AC | PRN
  Administered 2016-09-10: 250 mL via INTRAVENOUS
  Filled 2016-09-09: qty 250

## 2016-09-09 MED ORDER — DOLUTEGRAVIR SODIUM 50 MG PO TABS
50.0000 mg | ORAL_TABLET | Freq: Every day | ORAL | Status: DC
  Administered 2016-09-10 – 2016-09-19 (×10): 50 mg via ORAL
  Filled 2016-09-09 (×10): qty 1

## 2016-09-09 MED ORDER — CHLORHEXIDINE GLUCONATE 0.12% ORAL RINSE (MEDLINE KIT)
15.0000 mL | Freq: Two times a day (BID) | OROMUCOSAL | Status: DC
  Administered 2016-09-09 – 2016-09-11 (×3): 15 mL via OROMUCOSAL

## 2016-09-09 MED ORDER — PHENYLEPHRINE 40 MCG/ML (10ML) SYRINGE FOR IV PUSH (FOR BLOOD PRESSURE SUPPORT)
PREFILLED_SYRINGE | INTRAVENOUS | Status: AC
  Filled 2016-09-09: qty 10

## 2016-09-09 MED ORDER — PROPOFOL 10 MG/ML IV BOLUS
INTRAVENOUS | Status: AC
  Filled 2016-09-09: qty 20

## 2016-09-09 MED ORDER — PROTAMINE SULFATE 10 MG/ML IV SOLN
INTRAVENOUS | Status: AC
  Filled 2016-09-09: qty 10

## 2016-09-09 MED ORDER — SODIUM CHLORIDE 0.9 % IV SOLN: 0.0000 ug/kg/h | INTRAVENOUS | Status: DC

## 2016-09-09 MED ORDER — MIDAZOLAM HCL 10 MG/2ML IJ SOLN
INTRAMUSCULAR | Status: AC
  Filled 2016-09-09: qty 2

## 2016-09-09 MED ORDER — ALBUTEROL SULFATE HFA 108 (90 BASE) MCG/ACT IN AERS
INHALATION_SPRAY | RESPIRATORY_TRACT | Status: DC | PRN
  Administered 2016-09-09 (×2): 2 via RESPIRATORY_TRACT

## 2016-09-09 MED ORDER — NITROGLYCERIN IN D5W 200-5 MCG/ML-% IV SOLN: 0.0000 ug/min | INTRAVENOUS | Status: DC

## 2016-09-09 MED ORDER — ROCURONIUM BROMIDE 10 MG/ML (PF) SYRINGE
PREFILLED_SYRINGE | INTRAVENOUS | Status: AC
  Filled 2016-09-09: qty 5

## 2016-09-09 MED ORDER — VANCOMYCIN HCL IN DEXTROSE 1-5 GM/200ML-% IV SOLN
1000.0000 mg | Freq: Once | INTRAVENOUS | Status: AC
Start: 2016-09-09 — End: 2016-09-09
  Administered 2016-09-09: 1000 mg via INTRAVENOUS
  Filled 2016-09-09: qty 200

## 2016-09-09 MED ORDER — ROCURONIUM BROMIDE 10 MG/ML (PF) SYRINGE
PREFILLED_SYRINGE | INTRAVENOUS | Status: AC
  Filled 2016-09-09: qty 15

## 2016-09-09 MED ORDER — ORAL CARE MOUTH RINSE
15.0000 mL | Freq: Four times a day (QID) | OROMUCOSAL | Status: DC
  Administered 2016-09-10 – 2016-09-11 (×5): 15 mL via OROMUCOSAL

## 2016-09-09 MED ORDER — ONDANSETRON HCL 4 MG/2ML IJ SOLN
INTRAMUSCULAR | Status: AC
  Filled 2016-09-09: qty 2

## 2016-09-09 MED ORDER — ASPIRIN 81 MG PO CHEW: 324.0000 mg | CHEWABLE_TABLET | Freq: Every day | ORAL | Status: DC

## 2016-09-09 MED ORDER — OXYCODONE HCL 5 MG PO TABS
5.0000 mg | ORAL_TABLET | ORAL | Status: DC | PRN
  Administered 2016-09-10 – 2016-09-16 (×32): 10 mg via ORAL
  Filled 2016-09-09 (×32): qty 2

## 2016-09-09 MED ORDER — SODIUM CHLORIDE 0.9% FLUSH: 3.0000 mL | INTRAVENOUS | Status: DC | PRN

## 2016-09-09 MED ORDER — FENTANYL CITRATE (PF) 250 MCG/5ML IJ SOLN
INTRAMUSCULAR | Status: AC
  Filled 2016-09-09: qty 20

## 2016-09-09 MED ORDER — DOPAMINE-DEXTROSE 3.2-5 MG/ML-% IV SOLN
2.0000 ug/kg/min | INTRAVENOUS | Status: DC
  Administered 2016-09-11: 2 ug/kg/min via INTRAVENOUS
  Filled 2016-09-09: qty 250

## 2016-09-09 MED ORDER — PANTOPRAZOLE SODIUM 40 MG PO TBEC
40.0000 mg | DELAYED_RELEASE_TABLET | Freq: Every day | ORAL | Status: DC
  Administered 2016-09-11 – 2016-09-15 (×5): 40 mg via ORAL
  Filled 2016-09-09 (×5): qty 1

## 2016-09-09 MED ORDER — SODIUM CHLORIDE 0.9 % IV SOLN: 250.0000 mL | INTRAVENOUS | Status: DC

## 2016-09-09 MED ORDER — POTASSIUM CHLORIDE 10 MEQ/50ML IV SOLN
10.0000 meq | INTRAVENOUS | Status: AC
  Administered 2016-09-09 (×3): 10 meq via INTRAVENOUS

## 2016-09-09 MED ORDER — SODIUM CHLORIDE 0.9 % IV SOLN: INTRAVENOUS | Status: DC

## 2016-09-09 MED ORDER — MORPHINE SULFATE (PF) 2 MG/ML IV SOLN: 1.0000 mg | INTRAVENOUS | Status: DC | PRN

## 2016-09-09 MED ORDER — SODIUM CHLORIDE 0.9 % IV SOLN
0.4000 ug/kg/h | INTRAVENOUS | Status: DC
  Administered 2016-09-09 (×2): 1 ug/kg/h via INTRAVENOUS
  Filled 2016-09-09 (×3): qty 4

## 2016-09-09 MED ORDER — TRANEXAMIC ACID 1000 MG/10ML IV SOLN
1000.0000 mg | INTRAVENOUS | Status: DC
  Filled 2016-09-09: qty 10

## 2016-09-09 MED ORDER — METOPROLOL TARTRATE 5 MG/5ML IV SOLN
2.5000 mg | INTRAVENOUS | Status: DC | PRN
  Administered 2016-09-12 – 2016-09-15 (×4): 5 mg via INTRAVENOUS
  Filled 2016-09-09 (×6): qty 5

## 2016-09-09 MED ORDER — CHLORHEXIDINE GLUCONATE 0.12 % MT SOLN
15.0000 mL | OROMUCOSAL | Status: AC
  Administered 2016-09-09: 15 mL via OROMUCOSAL
  Filled 2016-09-09: qty 15

## 2016-09-09 MED ORDER — SERTRALINE HCL 50 MG PO TABS
50.0000 mg | ORAL_TABLET | Freq: Every day | ORAL | Status: DC
  Administered 2016-09-10 – 2016-09-19 (×10): 50 mg via ORAL
  Filled 2016-09-09 (×10): qty 1

## 2016-09-09 MED ORDER — ASPIRIN EC 325 MG PO TBEC
325.0000 mg | DELAYED_RELEASE_TABLET | Freq: Every day | ORAL | Status: DC
  Administered 2016-09-10 – 2016-09-15 (×6): 325 mg via ORAL
  Filled 2016-09-09 (×6): qty 1

## 2016-09-09 MED ORDER — EMTRICITABINE-TENOFOVIR AF 200-25 MG PO TABS
1.0000 | ORAL_TABLET | Freq: Every day | ORAL | Status: DC
  Administered 2016-09-10 – 2016-09-19 (×10): 1 via ORAL
  Filled 2016-09-09 (×10): qty 1

## 2016-09-09 MED ORDER — DOCUSATE SODIUM 100 MG PO CAPS
200.0000 mg | ORAL_CAPSULE | Freq: Every day | ORAL | Status: DC
  Administered 2016-09-10 – 2016-09-15 (×6): 200 mg via ORAL
  Filled 2016-09-09 (×6): qty 2

## 2016-09-09 MED ORDER — TRAMADOL HCL 50 MG PO TABS
50.0000 mg | ORAL_TABLET | ORAL | Status: DC | PRN
  Administered 2016-09-10 – 2016-09-14 (×3): 100 mg via ORAL
  Filled 2016-09-09 (×3): qty 2

## 2016-09-09 MED ORDER — MAGNESIUM SULFATE 4 GM/100ML IV SOLN
4.0000 g | Freq: Once | INTRAVENOUS | Status: AC
Start: 2016-09-09 — End: 2016-09-09
  Administered 2016-09-09: 4 g via INTRAVENOUS
  Filled 2016-09-09: qty 100

## 2016-09-09 MED ORDER — LACTATED RINGERS IV SOLN: 500.0000 mL | Freq: Once | INTRAVENOUS | Status: DC | PRN

## 2016-09-09 MED ORDER — LACTATED RINGERS IV SOLN
INTRAVENOUS | Status: DC | PRN
Start: 2016-09-09 — End: 2016-09-09
  Administered 2016-09-09 (×2): via INTRAVENOUS

## 2016-09-09 MED ORDER — FENTANYL CITRATE (PF) 250 MCG/5ML IJ SOLN
INTRAMUSCULAR | Status: DC | PRN
  Administered 2016-09-09: 250 ug via INTRAVENOUS
  Administered 2016-09-09: 500 ug via INTRAVENOUS
  Administered 2016-09-09: 250 ug via INTRAVENOUS
  Administered 2016-09-09: 100 ug via INTRAVENOUS
  Administered 2016-09-09: 250 ug via INTRAVENOUS
  Administered 2016-09-09 (×2): 50 ug via INTRAVENOUS
  Administered 2016-09-09: 250 ug via INTRAVENOUS
  Administered 2016-09-09: 50 ug via INTRAVENOUS
  Administered 2016-09-09: 250 ug via INTRAVENOUS

## 2016-09-09 MED ORDER — ACETAMINOPHEN 160 MG/5ML PO SOLN: 650.0000 mg | Freq: Once | ORAL | Status: AC

## 2016-09-09 MED ORDER — METOPROLOL TARTRATE 25 MG/10 ML ORAL SUSPENSION: 12.5000 mg | Freq: Two times a day (BID) | ORAL | Status: DC

## 2016-09-09 MED ORDER — LACTATED RINGERS IV SOLN: INTRAVENOUS | Status: DC

## 2016-09-09 MED ORDER — FENTANYL CITRATE (PF) 250 MCG/5ML IJ SOLN
INTRAMUSCULAR | Status: AC
  Filled 2016-09-09: qty 5

## 2016-09-09 MED ORDER — LEVALBUTEROL HCL 1.25 MG/0.5ML IN NEBU
1.2500 mg | INHALATION_SOLUTION | Freq: Four times a day (QID) | RESPIRATORY_TRACT | Status: DC
  Administered 2016-09-09: 1.25 mg via RESPIRATORY_TRACT
  Filled 2016-09-09: qty 0.5

## 2016-09-09 MED ORDER — PHENYLEPHRINE HCL 10 MG/ML IJ SOLN
INTRAMUSCULAR | Status: DC | PRN
  Administered 2016-09-09: 25 ug/min via INTRAVENOUS

## 2016-09-09 MED ORDER — INSULIN REGULAR BOLUS VIA INFUSION
0.0000 [IU] | Freq: Three times a day (TID) | INTRAVENOUS | Status: AC
  Filled 2016-09-09: qty 10

## 2016-09-09 MED ORDER — DEXTROSE 5 % IV SOLN
1.5000 g | Freq: Two times a day (BID) | INTRAVENOUS | Status: AC
  Administered 2016-09-09 – 2016-09-11 (×4): 1.5 g via INTRAVENOUS
  Filled 2016-09-09 (×4): qty 1.5

## 2016-09-09 MED ORDER — SODIUM CHLORIDE 0.9 % IV SOLN
0.0000 ug/kg/h | INTRAVENOUS | Status: DC
  Filled 2016-09-09: qty 2

## 2016-09-09 MED ORDER — PROPOFOL 10 MG/ML IV BOLUS
INTRAVENOUS | Status: DC | PRN
  Administered 2016-09-09: 50 mg via INTRAVENOUS

## 2016-09-09 MED ORDER — FENTANYL CITRATE (PF) 250 MCG/5ML IJ SOLN
INTRAMUSCULAR | Status: AC
  Filled 2016-09-09: qty 10

## 2016-09-09 MED ORDER — METOCLOPRAMIDE HCL 5 MG/ML IJ SOLN
10.0000 mg | Freq: Four times a day (QID) | INTRAMUSCULAR | Status: AC
  Administered 2016-09-10 – 2016-09-14 (×19): 10 mg via INTRAVENOUS
  Filled 2016-09-09 (×16): qty 2

## 2016-09-09 MED ORDER — METOPROLOL TARTRATE 12.5 MG HALF TABLET
12.5000 mg | ORAL_TABLET | Freq: Two times a day (BID) | ORAL | Status: DC
  Administered 2016-09-10 – 2016-09-12 (×5): 12.5 mg via ORAL
  Filled 2016-09-09 (×5): qty 1

## 2016-09-09 MED ORDER — INSULIN REGULAR HUMAN 100 UNIT/ML IJ SOLN
INTRAMUSCULAR | Status: DC | PRN
  Administered 2016-09-09: 1 [IU]/h via INTRAVENOUS

## 2016-09-09 MED ORDER — SODIUM CHLORIDE 0.9% FLUSH
3.0000 mL | Freq: Two times a day (BID) | INTRAVENOUS | Status: DC
  Administered 2016-09-10 – 2016-09-15 (×10): 3 mL via INTRAVENOUS

## 2016-09-09 MED ORDER — DEXMEDETOMIDINE HCL IN NACL 200 MCG/50ML IV SOLN
INTRAVENOUS | Status: DC | PRN
Start: 2016-09-09 — End: 2016-09-09
  Administered 2016-09-09: .3 ug/kg/h via INTRAVENOUS

## 2016-09-09 MED ORDER — DEXMEDETOMIDINE HCL IN NACL 200 MCG/50ML IV SOLN
INTRAVENOUS | Status: AC
  Filled 2016-09-09: qty 50

## 2016-09-09 MED ORDER — MIDAZOLAM HCL 2 MG/2ML IJ SOLN
2.0000 mg | INTRAMUSCULAR | Status: DC | PRN
  Administered 2016-09-09 (×2): 2 mg via INTRAVENOUS
  Filled 2016-09-09 (×2): qty 2

## 2016-09-09 MED ORDER — MIDAZOLAM HCL 5 MG/5ML IJ SOLN
INTRAMUSCULAR | Status: DC | PRN
  Administered 2016-09-09: 3 mg via INTRAVENOUS
  Administered 2016-09-09: 2 mg via INTRAVENOUS
  Administered 2016-09-09: 1 mg via INTRAVENOUS
  Administered 2016-09-09 (×2): 2 mg via INTRAVENOUS

## 2016-09-09 MED ORDER — SODIUM CHLORIDE 0.9 % IV SOLN: 0.4000 ug/kg/h | INTRAVENOUS | Status: DC

## 2016-09-09 MED ORDER — PROTAMINE SULFATE 10 MG/ML IV SOLN
INTRAVENOUS | Status: DC | PRN
  Administered 2016-09-09: 50 mg via INTRAVENOUS
  Administered 2016-09-09: 300 mg via INTRAVENOUS

## 2016-09-09 MED FILL — Magnesium Sulfate Inj 50%: INTRAMUSCULAR | Qty: 10 | Status: AC

## 2016-09-09 MED FILL — Potassium Chloride Inj 2 mEq/ML: INTRAVENOUS | Qty: 10 | Status: AC

## 2016-09-09 MED FILL — Heparin Sodium (Porcine) Inj 1000 Unit/ML: INTRAMUSCULAR | Qty: 30 | Status: AC

## 2016-09-09 SURGICAL SUPPLY — 117 items
ADAPTER CARDIO PERF ANTE/RETRO (ADAPTER) ×8 IMPLANT
BAG DECANTER FOR FLEXI CONT (MISCELLANEOUS) ×4 IMPLANT
BANDAGE ACE 4X5 VEL STRL LF (GAUZE/BANDAGES/DRESSINGS) ×4 IMPLANT
BANDAGE ACE 6X5 VEL STRL LF (GAUZE/BANDAGES/DRESSINGS) ×4 IMPLANT
BASKET HEART  (ORDER IN 25'S) (MISCELLANEOUS) ×1
BASKET HEART (ORDER IN 25'S) (MISCELLANEOUS) ×1
BASKET HEART (ORDER IN 25S) (MISCELLANEOUS) ×2 IMPLANT
BLADE CLIPPER SURG (BLADE) ×4 IMPLANT
BLADE STERNUM SYSTEM 6 (BLADE) ×4 IMPLANT
BLADE SURG 11 STRL SS (BLADE) ×4 IMPLANT
BLADE SURG 12 STRL SS (BLADE) ×4 IMPLANT
BNDG GAUZE ELAST 4 BULKY (GAUZE/BANDAGES/DRESSINGS) ×4 IMPLANT
CANISTER SUCT 3000ML PPV (MISCELLANEOUS) ×4 IMPLANT
CANNULA ARTERIAL NVNT 3/8 22FR (MISCELLANEOUS) ×4 IMPLANT
CANNULA GUNDRY RCSP 15FR (MISCELLANEOUS) ×4 IMPLANT
CATH CPB KIT VANTRIGT (MISCELLANEOUS) ×4 IMPLANT
CATH ROBINSON RED A/P 18FR (CATHETERS) ×20 IMPLANT
CATH THORACIC 36FR RT ANG (CATHETERS) ×4 IMPLANT
CLIP RETRACTION 3.0MM CORONARY (MISCELLANEOUS) ×4 IMPLANT
CLIP TI WIDE RED SMALL 24 (CLIP) ×4 IMPLANT
CONT SPECI 4OZ STER CLIK (MISCELLANEOUS) ×4 IMPLANT
CRADLE DONUT ADULT HEAD (MISCELLANEOUS) ×4 IMPLANT
DERMABOND ADVANCED (GAUZE/BANDAGES/DRESSINGS) ×2
DERMABOND ADVANCED .7 DNX12 (GAUZE/BANDAGES/DRESSINGS) ×2 IMPLANT
DRAIN CHANNEL 32F RND 10.7 FF (WOUND CARE) ×4 IMPLANT
DRAPE CARDIOVASCULAR INCISE (DRAPES) ×2
DRAPE SLUSH/WARMER DISC (DRAPES) ×4 IMPLANT
DRAPE SRG 135X102X78XABS (DRAPES) ×2 IMPLANT
DRSG AQUACEL AG ADV 3.5X10 (GAUZE/BANDAGES/DRESSINGS) ×4 IMPLANT
DRSG AQUACEL AG ADV 3.5X14 (GAUZE/BANDAGES/DRESSINGS) IMPLANT
ELECT BLADE 4.0 EZ CLEAN MEGAD (MISCELLANEOUS) ×4
ELECT BLADE 6.5 EXT (BLADE) ×4 IMPLANT
ELECT CAUTERY BLADE 6.4 (BLADE) ×4 IMPLANT
ELECT REM PT RETURN 9FT ADLT (ELECTROSURGICAL) ×8
ELECTRODE BLDE 4.0 EZ CLN MEGD (MISCELLANEOUS) ×2 IMPLANT
ELECTRODE REM PT RTRN 9FT ADLT (ELECTROSURGICAL) ×4 IMPLANT
FELT TEFLON 1X6 (MISCELLANEOUS) ×8 IMPLANT
GAUZE SPONGE 4X4 12PLY STRL (GAUZE/BANDAGES/DRESSINGS) ×8 IMPLANT
GAUZE SPONGE 4X4 12PLY STRL LF (GAUZE/BANDAGES/DRESSINGS) ×8 IMPLANT
GLOVE BIO SURGEON STRL SZ 6.5 (GLOVE) ×15 IMPLANT
GLOVE BIO SURGEON STRL SZ7.5 (GLOVE) ×32 IMPLANT
GLOVE BIO SURGEONS STRL SZ 6.5 (GLOVE) ×5
GLOVE BIOGEL PI IND STRL 6 (GLOVE) ×4 IMPLANT
GLOVE BIOGEL PI IND STRL 6.5 (GLOVE) ×10 IMPLANT
GLOVE BIOGEL PI IND STRL 7.0 (GLOVE) ×2 IMPLANT
GLOVE BIOGEL PI IND STRL 8 (GLOVE) ×8 IMPLANT
GLOVE BIOGEL PI INDICATOR 6 (GLOVE) ×4
GLOVE BIOGEL PI INDICATOR 6.5 (GLOVE) ×10
GLOVE BIOGEL PI INDICATOR 7.0 (GLOVE) ×2
GLOVE BIOGEL PI INDICATOR 8 (GLOVE) ×8
GLOVE ECLIPSE 6.0 STRL STRAW (GLOVE) ×8 IMPLANT
GLOVE OPTIFIT SS 6.0 STRL BRWN (GLOVE) ×8 IMPLANT
GLOVE OPTIFIT SS 6.5 STRL BRWN (GLOVE) ×8 IMPLANT
GOWN STRL REUS W/ TWL LRG LVL3 (GOWN DISPOSABLE) ×20 IMPLANT
GOWN STRL REUS W/TWL LRG LVL3 (GOWN DISPOSABLE) ×20
HEMOSTAT POWDER SURGIFOAM 1G (HEMOSTASIS) ×12 IMPLANT
HEMOSTAT SURGICEL 2X14 (HEMOSTASIS) ×4 IMPLANT
INSERT FOGARTY XLG (MISCELLANEOUS) ×4 IMPLANT
KIT BASIN OR (CUSTOM PROCEDURE TRAY) ×4 IMPLANT
KIT ROOM TURNOVER OR (KITS) ×4 IMPLANT
KIT SUCTION CATH 14FR (SUCTIONS) ×4 IMPLANT
KIT VASOVIEW HEMOPRO VH 3000 (KITS) ×4 IMPLANT
LEAD PACING MYOCARDI (MISCELLANEOUS) ×4 IMPLANT
MARKER GRAFT CORONARY BYPASS (MISCELLANEOUS) ×12 IMPLANT
NS IRRIG 1000ML POUR BTL (IV SOLUTION) ×20 IMPLANT
PACK OPEN HEART (CUSTOM PROCEDURE TRAY) ×4 IMPLANT
PAD ARMBOARD 7.5X6 YLW CONV (MISCELLANEOUS) ×8 IMPLANT
PAD ELECT DEFIB RADIOL ZOLL (MISCELLANEOUS) ×4 IMPLANT
PENCIL BUTTON HOLSTER BLD 10FT (ELECTRODE) ×4 IMPLANT
PUNCH AORTIC ROTATE 4.0MM (MISCELLANEOUS) IMPLANT
PUNCH AORTIC ROTATE 4.5MM 8IN (MISCELLANEOUS) ×4 IMPLANT
PUNCH AORTIC ROTATE 5MM 8IN (MISCELLANEOUS) IMPLANT
SET CARDIOPLEGIA MPS 5001102 (MISCELLANEOUS) ×4 IMPLANT
SOLUTION ANTI FOG 6CC (MISCELLANEOUS) ×4 IMPLANT
SPOGE SURGIFLO 8M (HEMOSTASIS) ×2
SPONGE LAP 18X18 X RAY DECT (DISPOSABLE) ×8 IMPLANT
SPONGE SURGIFLO 8M (HEMOSTASIS) ×2 IMPLANT
SURGIFLO W/THROMBIN 8M KIT (HEMOSTASIS) ×4 IMPLANT
SUT BONE WAX W31G (SUTURE) ×4 IMPLANT
SUT MNCRL AB 4-0 PS2 18 (SUTURE) ×4 IMPLANT
SUT PROLENE 3 0 SH DA (SUTURE) IMPLANT
SUT PROLENE 3 0 SH1 36 (SUTURE) IMPLANT
SUT PROLENE 4 0 RB 1 (SUTURE) ×2
SUT PROLENE 4 0 SH DA (SUTURE) ×4 IMPLANT
SUT PROLENE 4-0 RB1 .5 CRCL 36 (SUTURE) ×2 IMPLANT
SUT PROLENE 5 0 C 1 36 (SUTURE) ×4 IMPLANT
SUT PROLENE 6 0 C 1 30 (SUTURE) ×24 IMPLANT
SUT PROLENE 6 0 CC (SUTURE) ×28 IMPLANT
SUT PROLENE 8 0 BV175 6 (SUTURE) ×16 IMPLANT
SUT PROLENE BLUE 7 0 (SUTURE) ×8 IMPLANT
SUT PROLENE POLY MONO (SUTURE) ×4 IMPLANT
SUT SILK  1 MH (SUTURE)
SUT SILK 1 MH (SUTURE) IMPLANT
SUT SILK 2 0 SH CR/8 (SUTURE) ×4 IMPLANT
SUT SILK 3 0 SH CR/8 (SUTURE) ×4 IMPLANT
SUT STEEL 6MS V (SUTURE) IMPLANT
SUT STEEL STERNAL CCS#1 18IN (SUTURE) ×4 IMPLANT
SUT STEEL SZ 6 DBL 3X14 BALL (SUTURE) ×8 IMPLANT
SUT VIC AB 1 CTX 36 (SUTURE) ×4
SUT VIC AB 1 CTX36XBRD ANBCTR (SUTURE) ×4 IMPLANT
SUT VIC AB 2-0 CT1 27 (SUTURE) ×2
SUT VIC AB 2-0 CT1 TAPERPNT 27 (SUTURE) ×2 IMPLANT
SUT VIC AB 2-0 CTX 27 (SUTURE) IMPLANT
SUT VIC AB 3-0 X1 27 (SUTURE) IMPLANT
SUTURE E-PAK OPEN HEART (SUTURE) ×4 IMPLANT
SWAB CULTURE LIQ STUART DBL (MISCELLANEOUS) ×4 IMPLANT
SYR 20ML ECCENTRIC (SYRINGE) ×4 IMPLANT
SYR 5ML LUER SLIP (SYRINGE) ×4 IMPLANT
SYSTEM SAHARA CHEST DRAIN ATS (WOUND CARE) ×4 IMPLANT
TOWEL GREEN STERILE (TOWEL DISPOSABLE) ×16 IMPLANT
TOWEL GREEN STERILE FF (TOWEL DISPOSABLE) ×8 IMPLANT
TOWEL OR 17X24 6PK STRL BLUE (TOWEL DISPOSABLE) ×8 IMPLANT
TOWEL OR 17X26 10 PK STRL BLUE (TOWEL DISPOSABLE) ×8 IMPLANT
TRAY FOLEY SILVER 16FR TEMP (SET/KITS/TRAYS/PACK) ×4 IMPLANT
TUBING INSUFFLATION (TUBING) ×4 IMPLANT
UNDERPAD 30X30 (UNDERPADS AND DIAPERS) ×4 IMPLANT
WATER STERILE IRR 1000ML POUR (IV SOLUTION) ×8 IMPLANT

## 2016-09-09 NOTE — Brief Op Note (Signed)
09/02/2016 - 09/09/2016  12:31 PM  PATIENT:  [name]  51 y.o. male  PRE-OPERATIVE DIAGNOSIS:  CAD  POST-OPERATIVE DIAGNOSIS:  coronary artery disease  PROCEDURE:  Procedure(s): CORONARY ARTERY BYPASS GRAFTING times three  using left internal mammary artery and right saphenous vein harvest (N/A) TRANSESOPHAGEAL ECHOCARDIOGRAM (TEE) (N/A)  LIMA to LAD SVG to PL SVG to OM1  SURGEON:  Surgeon(s) and Role:    Ivin Poot, MD - Primary  PHYSICIAN ASSISTANT:  Nicholes Rough, PA-C   ANESTHESIA:   general  EBL:  Total I/O In: -  Out: 1500 [Urine:1500]  BLOOD ADMINISTERED:none  DRAINS: routine   LOCAL MEDICATIONS USED:  NONE  SPECIMEN:  No Specimen  DISPOSITION OF SPECIMEN:  N/A  COUNTS:  YES  TOURNIQUET:  * No tourniquets in log *  DICTATION: .Dragon Dictation  PLAN OF CARE: Admit to inpatient   PATIENT DISPOSITION:  ICU - intubated and hemodynamically stable.   Delay start of Pharmacological VTE agent (>24hrs) due to surgical blood loss or risk of bleeding: yes

## 2016-09-09 NOTE — Anesthesia Procedure Notes (Signed)
Procedure Name: Intubation Date/Time: 09/09/2016 7:59 AM Performed by: Rebekah Chesterfield L Pre-anesthesia Checklist: Patient identified, Emergency Drugs available, Suction available and Patient being monitored Patient Re-evaluated:Patient Re-evaluated prior to inductionOxygen Delivery Method: Circle System Utilized Preoxygenation: Pre-oxygenation with 100% oxygen Intubation Type: IV induction Ventilation: Mask ventilation without difficulty Laryngoscope Size: Mac and 4 Grade View: Grade II Tube type: Subglottic suction tube Tube size: 8.0 mm Number of attempts: 1 Airway Equipment and Method: Stylet and Oral airway Placement Confirmation: ETT inserted through vocal cords under direct vision,  positive ETCO2 and breath sounds checked- equal and bilateral Secured at: 22 cm Tube secured with: Tape Dental Injury: Teeth and Oropharynx as per pre-operative assessment

## 2016-09-09 NOTE — Procedures (Signed)
Extubation Procedure Note  Patient Details:   Name: [name] DOB: 05-09-1965 MRN: [id number]   Airway Documentation:     Evaluation  O2 sats: stable throughout Complications: No apparent complications Patient did tolerate procedure well. Bilateral Breath Sounds: Diminished   Yes   Patient extubated to [facility] without any complications. Patient is able to speak clearly and has an adequate cough. Good cuff leak present prior to extubation as well as passing all parameters.   [name] 09/09/2016, 10:18 PM

## 2016-09-09 NOTE — Transfer of Care (Signed)
Immediate Anesthesia Transfer of Care Note  Patient: [name]  Procedure(s) Performed: Procedure(s): CORONARY ARTERY BYPASS GRAFTING times three  using left internal mammary artery and right saphenous vein harvest (N/A) TRANSESOPHAGEAL ECHOCARDIOGRAM (TEE) (N/A) VIDEO BRONCHOSCOPY (N/A)  Patient Location: SICU  Anesthesia Type:General  Level of Consciousness: sedated, unresponsive and Patient remains intubated per anesthesia plan  Airway & Oxygen Therapy: Patient remains intubated per anesthesia plan and Patient placed on Ventilator (see vital sign flow sheet for setting)  Post-op Assessment: Report given to RN and Post -op Vital signs reviewed and stable  Post vital signs: Reviewed and stable  Last Vitals:  Vitals:   09/09/16 0419 09/09/16 0513  BP: 112/68   Pulse: 70 63  Resp: 14   Temp: 36.6 C     Last Pain:  Vitals:   09/08/16 2040  TempSrc:   PainSc: 0-No pain      Patients Stated Pain Goal: 0 (50/41/36 [id number])  Complications: No apparent anesthesia complications

## 2016-09-09 NOTE — Progress Notes (Addendum)
  Echocardiogram Echocardiogram Transesophageal has been performed.  [name] M 09/09/2016, 8:57 AM

## 2016-09-09 NOTE — Progress Notes (Signed)
RT called to patient room due to patient having a decrease in sats to 77%.  Attempted to perform recruitment maneuver however patient sats continue to decrease to 60s.  Took patient off of ventilator and bagged patient, with PEEP valve set at 10, and patient sats improved to 98%.  ABG was obtained and results were given to MD.  Per MD, increased tidal volume and PEEP on ventilator.  Will continue to monitor.

## 2016-09-09 NOTE — Progress Notes (Signed)
The patient was examined and preop studies reviewed. There has been no change from the prior exam and the patient is ready for surgery. Plan CABG on [name]

## 2016-09-09 NOTE — Progress Notes (Signed)
NIF -20, VC 1.1L

## 2016-09-09 NOTE — Anesthesia Postprocedure Evaluation (Signed)
Anesthesia Post Note  Patient: [name]  Procedure(s) Performed: Procedure(s) (LRB): CORONARY ARTERY BYPASS GRAFTING times three  using left internal mammary artery and right saphenous vein harvest (N/A) TRANSESOPHAGEAL ECHOCARDIOGRAM (TEE) (N/A) VIDEO BRONCHOSCOPY (N/A)  Patient location during evaluation: SICU Anesthesia Type: General Level of consciousness: sedated Pain management: pain level controlled Vital Signs Assessment: post-procedure vital signs reviewed and stable Respiratory status: patient remains intubated per anesthesia plan Cardiovascular status: stable Anesthetic complications: no       Last Vitals:  Vitals:   09/09/16 1615 09/09/16 1630  BP:    Pulse: (!) 102 (!) 102  Resp: 17 18  Temp: 37.1 C 37.2 C    Last Pain:  Vitals:   09/09/16 1600  TempSrc: Core (Comment)  PainSc:                  [name]

## 2016-09-09 NOTE — Anesthesia Procedure Notes (Addendum)
Central Venous Catheter Insertion Performed by: Nolon Nations, anesthesiologist Patient location: Pre-op. Preanesthetic checklist: patient identified, IV checked, site marked, risks and benefits discussed, surgical consent, monitors and equipment checked, pre-op evaluation, timeout performed and anesthesia consent Position: Trendelenburg Lidocaine 1% used for infiltration and patient sedated Hand hygiene performed , maximum sterile barriers used  and Seldinger technique used Catheter size: 8.5 Fr Total catheter length 10. Central line and PA cath was placed.Sheath introducer Swan type:thermodilution PA Cath depth:45 Procedure performed using ultrasound guided technique. Ultrasound Notes:anatomy identified, needle tip was noted to be adjacent to the nerve/plexus identified, no ultrasound evidence of intravascular and/or intraneural injection and image(s) printed for medical record Attempts: 1 Following insertion, line sutured and dressing applied. Post procedure assessment: blood return through all ports, free fluid flow and no air  Patient tolerated the procedure well with no immediate complications.

## 2016-09-09 NOTE — Progress Notes (Signed)
EVENING ROUNDS NOTE :     [address]       [place] [address]             [phone]                 Day of Surgery Procedure(s) (LRB): CORONARY ARTERY BYPASS GRAFTING times three  using left internal mammary artery and right saphenous vein harvest (N/A) TRANSESOPHAGEAL ECHOCARDIOGRAM (TEE) (N/A) VIDEO BRONCHOSCOPY (N/A)  Total Length of Stay:  LOS: 7 days  BP 96/64   Pulse 99   Temp 99.9 F (37.7 C)   Resp 18   Ht '5\' 9"'$  (1.753 m)   Wt 271 lb 9.6 oz (123.2 kg)   SpO2 91%   BMI 40.11 kg/m   .Intake/Output      05/20 0701 - 05/21 0700 05/21 0701 - 05/22 0700   P.O. 385    I.V. (mL/kg) 505.3 (4.1) 3110.1 (25.2)   Blood  865   NG/GT  30   IV Piggyback 0 250   Total Intake(mL/kg) 890.3 (7.2) 4255.1 (34.5)   Urine (mL/kg/hr) 1800 (0.6) 2900 (2)   Blood  2000 (1.4)   Chest Tube  140 (0.1)   Total Output 1800 5040   Net -909.7 -784.9          . sodium chloride 20 mL/hr at 09/09/16 1800  . [START ON 09/10/2016] sodium chloride    . sodium chloride 20 mL/hr at 09/09/16 1800  . albumin human    . cefUROXime (ZINACEF)  IV Stopped (09/09/16 1751)  . dexmedetomidine (PRECEDEX) IV infusion 1.2 mcg/kg/hr (09/09/16 1813)  . DOPamine 3 mcg/kg/min (09/09/16 1800)  . famotidine (PEPCID) IV Stopped (09/09/16 1654)  . insulin (NOVOLIN-R) infusion 4.7 Units/hr (09/09/16 1800)  . lactated ringers    . lactated ringers 20 mL/hr at 09/09/16 1800  . lactated ringers    . magnesium sulfate 4 g (09/09/16 1600)  . nitroGLYCERIN 0 mcg/min (09/09/16 1700)  . phenylephrine (NEO-SYNEPHRINE) Adult infusion Stopped (09/09/16 1545)  . potassium chloride 10 mEq (09/09/16 1758)  . vancomycin       Lab Results  Component Value Date   WBC 17.9 (H) 09/09/2016   HGB 14.6 09/09/2016   HCT 43.0 09/09/2016   PLT 133 (L) 09/09/2016   GLUCOSE 135 (H) 09/09/2016   CHOL 116 09/08/2016   TRIG 189 (H) 09/08/2016   HDL 28 (L) 09/08/2016   LDLDIRECT 74.0 04/12/2011   LDLCALC 50  09/08/2016   ALT 79 (H) 09/06/2016   AST 53 (H) 09/06/2016   NA 141 09/09/2016   K 3.8 09/09/2016   CL 103 09/09/2016   CREATININE 0.80 09/09/2016   BUN 13 09/09/2016   CO2 23 09/09/2016   TSH 4.584 (H) 09/06/2016   INR 1.16 09/09/2016   HGBA1C 10.6 (H) 09/06/2016   Waking up but still on 100% fio2 , not ready to wean Not bleeding  [name] MD  Beeper [phone] Office [phone] 09/09/2016 6:40 PM

## 2016-09-10 ENCOUNTER — Inpatient Hospital Stay (HOSPITAL_COMMUNITY): Payer: 59

## 2016-09-10 ENCOUNTER — Encounter (HOSPITAL_COMMUNITY): Payer: Self-pay | Admitting: Cardiothoracic Surgery

## 2016-09-10 DIAGNOSIS — Z951 Presence of aortocoronary bypass graft: Secondary | ICD-10-CM

## 2016-09-10 LAB — CBC
HCT: 38.9 % — ABNORMAL LOW (ref 39.0–52.0)
HEMATOCRIT: 42.7 % (ref 39.0–52.0)
HEMOGLOBIN: 14 g/dL (ref 13.0–17.0)
Hemoglobin: 12.5 g/dL — ABNORMAL LOW (ref 13.0–17.0)
MCH: 27.9 pg (ref 26.0–34.0)
MCH: 27.7 pg (ref 26.0–34.0)
MCHC: 32.8 g/dL (ref 30.0–36.0)
MCHC: 32.1 g/dL (ref 30.0–36.0)
MCV: 85.2 fL (ref 78.0–100.0)
MCV: 86.3 fL (ref 78.0–100.0)
PLATELETS: 139 10*3/uL — AB (ref 150–400)
Platelets: 147 10*3/uL — ABNORMAL LOW (ref 150–400)
RBC: 5.01 MIL/uL (ref 4.22–5.81)
RBC: 4.51 MIL/uL (ref 4.22–5.81)
RDW: 13.4 % (ref 11.5–15.5)
RDW: 13.6 % (ref 11.5–15.5)
WBC: 16.1 10*3/uL — AB (ref 4.0–10.5)
WBC: 16.1 10*3/uL — AB (ref 4.0–10.5)

## 2016-09-10 LAB — GLUCOSE, CAPILLARY
GLUCOSE-CAPILLARY: 128 mg/dL — AB (ref 65–99)
GLUCOSE-CAPILLARY: 135 mg/dL — AB (ref 65–99)
GLUCOSE-CAPILLARY: 140 mg/dL — AB (ref 65–99)
GLUCOSE-CAPILLARY: 180 mg/dL — AB (ref 65–99)
GLUCOSE-CAPILLARY: 155 mg/dL — AB (ref 65–99)
GLUCOSE-CAPILLARY: 184 mg/dL — AB (ref 65–99)
GLUCOSE-CAPILLARY: 129 mg/dL — AB (ref 65–99)
Glucose-Capillary: 100 mg/dL — ABNORMAL HIGH (ref 65–99)
Glucose-Capillary: 101 mg/dL — ABNORMAL HIGH (ref 65–99)
Glucose-Capillary: 113 mg/dL — ABNORMAL HIGH (ref 65–99)
Glucose-Capillary: 121 mg/dL — ABNORMAL HIGH (ref 65–99)
Glucose-Capillary: 127 mg/dL — ABNORMAL HIGH (ref 65–99)
Glucose-Capillary: 168 mg/dL — ABNORMAL HIGH (ref 65–99)
Glucose-Capillary: 192 mg/dL — ABNORMAL HIGH (ref 65–99)
Glucose-Capillary: 193 mg/dL — ABNORMAL HIGH (ref 65–99)

## 2016-09-10 LAB — BLOOD GAS, ARTERIAL
Acid-base deficit: 3.2 mmol/L — ABNORMAL HIGH (ref 0.0–2.0)
Bicarbonate: 20.8 mmol/L (ref 20.0–28.0)
Drawn by: 41977
O2 Content: 4 L/min
O2 Saturation: 94 %
Patient temperature: 98.6
pCO2 arterial: 34.4 mmHg (ref 32.0–48.0)
pH, Arterial: 7.399 (ref 7.350–7.450)
pO2, Arterial: 70.1 mmHg — ABNORMAL LOW (ref 83.0–108.0)

## 2016-09-10 LAB — CREATININE, SERUM
CREATININE: 1.17 mg/dL (ref 0.61–1.24)
GFR calc non Af Amer: >60 mL/min (ref >60)

## 2016-09-10 LAB — BASIC METABOLIC PANEL
ANION GAP: 7 (ref 5–15)
BUN: 10 mg/dL (ref 6–20)
CALCIUM: 8 mg/dL — AB (ref 8.9–10.3)
CO2: 21 mmol/L — AB (ref 22–32)
Chloride: 107 mmol/L (ref 101–111)
Creatinine, Ser: 0.92 mg/dL (ref 0.61–1.24)
GFR calc non Af Amer: >60 mL/min (ref >60)
Glucose, Bld: 131 mg/dL — ABNORMAL HIGH (ref 65–99)
Potassium: 4 mmol/L (ref 3.5–5.1)
SODIUM: 135 mmol/L (ref 135–145)

## 2016-09-10 LAB — POCT I-STAT, CHEM 8
BUN: 13 mg/dL (ref 6–20)
CREATININE: 1.1 mg/dL (ref 0.61–1.24)
Calcium, Ion: 1.16 mmol/L (ref 1.15–1.40)
Chloride: 101 mmol/L (ref 101–111)
Glucose, Bld: 201 mg/dL — ABNORMAL HIGH (ref 65–99)
HEMATOCRIT: 38 % — AB (ref 39.0–52.0)
HEMOGLOBIN: 12.9 g/dL — AB (ref 13.0–17.0)
POTASSIUM: 3.8 mmol/L (ref 3.5–5.1)
SODIUM: 137 mmol/L (ref 135–145)
TCO2: 23 mmol/L (ref 0–100)

## 2016-09-10 LAB — ECHO TEE
Ao-asc: 24 cm
LVOT area: 3.14 cm2
LVOT diameter: 20 mm

## 2016-09-10 LAB — MAGNESIUM
MAGNESIUM: 2.2 mg/dL (ref 1.7–2.4)
Magnesium: 2 mg/dL (ref 1.7–2.4)

## 2016-09-10 MED ORDER — INSULIN DETEMIR 100 UNIT/ML ~~LOC~~ SOLN
28.0000 [IU] | Freq: Two times a day (BID) | SUBCUTANEOUS | Status: DC
  Administered 2016-09-10 – 2016-09-16 (×14): 28 [IU] via SUBCUTANEOUS
  Filled 2016-09-10 (×17): qty 0.28

## 2016-09-10 MED ORDER — LEVALBUTEROL HCL 1.25 MG/0.5ML IN NEBU
1.2500 mg | INHALATION_SOLUTION | Freq: Three times a day (TID) | RESPIRATORY_TRACT | Status: DC
  Administered 2016-09-10 – 2016-09-15 (×13): 1.25 mg via RESPIRATORY_TRACT
  Filled 2016-09-10 (×16): qty 0.5

## 2016-09-10 MED ORDER — FUROSEMIDE 10 MG/ML IJ SOLN
INTRAMUSCULAR | Status: AC
  Filled 2016-09-10: qty 4

## 2016-09-10 MED ORDER — FUROSEMIDE 10 MG/ML IJ SOLN
40.0000 mg | Freq: Once | INTRAMUSCULAR | Status: AC
  Administered 2016-09-10: 40 mg via INTRAVENOUS
  Filled 2016-09-10: qty 4

## 2016-09-10 MED ORDER — KETOROLAC TROMETHAMINE 15 MG/ML IJ SOLN
15.0000 mg | Freq: Four times a day (QID) | INTRAMUSCULAR | Status: DC
  Administered 2016-09-10: 15 mg via INTRAVENOUS
  Filled 2016-09-10: qty 1

## 2016-09-10 MED ORDER — FUROSEMIDE 10 MG/ML IJ SOLN
20.0000 mg | Freq: Two times a day (BID) | INTRAMUSCULAR | Status: DC
  Administered 2016-09-10 (×2): 20 mg via INTRAVENOUS
  Filled 2016-09-10 (×2): qty 2

## 2016-09-10 MED ORDER — SODIUM BICARBONATE 8.4 % IV SOLN
50.0000 meq | Freq: Once | INTRAVENOUS | Status: AC
  Administered 2016-09-10: 50 meq via INTRAVENOUS
  Filled 2016-09-10: qty 50

## 2016-09-10 MED ORDER — INSULIN ASPART 100 UNIT/ML ~~LOC~~ SOLN
0.0000 [IU] | SUBCUTANEOUS | Status: DC
  Administered 2016-09-10 (×2): 4 [IU] via SUBCUTANEOUS
  Administered 2016-09-11 (×2): 2 [IU] via SUBCUTANEOUS
  Administered 2016-09-11 – 2016-09-12 (×5): 4 [IU] via SUBCUTANEOUS
  Administered 2016-09-12: 2 [IU] via SUBCUTANEOUS
  Administered 2016-09-12: 4 [IU] via SUBCUTANEOUS
  Administered 2016-09-12 (×2): 2 [IU] via SUBCUTANEOUS
  Administered 2016-09-12 – 2016-09-13 (×3): 4 [IU] via SUBCUTANEOUS
  Administered 2016-09-13: 2 [IU] via SUBCUTANEOUS
  Administered 2016-09-13: 4 [IU] via SUBCUTANEOUS
  Administered 2016-09-13 – 2016-09-14 (×2): 2 [IU] via SUBCUTANEOUS
  Administered 2016-09-14: 4 [IU] via SUBCUTANEOUS
  Administered 2016-09-14: 2 [IU] via SUBCUTANEOUS

## 2016-09-10 MED FILL — Sodium Bicarbonate IV Soln 8.4%: INTRAVENOUS | Qty: 50 | Status: AC

## 2016-09-10 MED FILL — Electrolyte-R (PH 7.4) Solution: INTRAVENOUS | Qty: 5000 | Status: AC

## 2016-09-10 MED FILL — Mannitol IV Soln 20%: INTRAVENOUS | Qty: 500 | Status: AC

## 2016-09-10 MED FILL — Heparin Sodium (Porcine) Inj 1000 Unit/ML: INTRAMUSCULAR | Qty: 30 | Status: AC

## 2016-09-10 MED FILL — Sodium Chloride IV Soln 0.9%: INTRAVENOUS | Qty: 2000 | Status: AC

## 2016-09-10 MED FILL — Lidocaine HCl IV Inj 20 MG/ML: INTRAVENOUS | Qty: 5 | Status: AC

## 2016-09-10 NOTE — Progress Notes (Signed)
1 Day Post-Op Procedure(s) (LRB): CORONARY ARTERY BYPASS GRAFTING times three  using left internal mammary artery and right saphenous vein harvest (N/A) TRANSESOPHAGEAL ECHOCARDIOGRAM (TEE) (N/A) VIDEO BRONCHOSCOPY (N/A) Subjective: Patient had delayed extubation because retained secretions and collapse-atelectasis right lower lobe which required intraoperative bronchoscopy X-ray this morning shows much improved aeration Patient sinus rhythm with stable hemodynamics, PA catheter out Out patient up in bed to chair  Objective: Vital signs in last 24 hours: Temp:  [98.1 F (36.7 C)-101.3 F (38.5 C)] 98.1 F (36.7 C) (05/22 1500) Pulse Rate:  [88-117] 117 (05/22 1700) Cardiac Rhythm: Sinus tachycardia (05/22 1600) Resp:  [13-30] 23 (05/22 1700) BP: (92-144)/(50-78) 144/73 (05/22 1700) SpO2:  [89 %-100 %] 89 % (05/22 1600) Arterial Line BP: (99-156)/(49-69) 149/69 (05/22 0800) FiO2 (%):  [40 %-80 %] 40 % (05/21 2123) Weight:  [284 lb 2.8 oz (128.9 kg)-286 lb 13.1 oz (130.1 kg)] 284 lb 2.8 oz (128.9 kg) (05/22 0600)  Hemodynamic parameters for last 24 hours: PAP: (17-32)/(8-21) 31/10 CO:  [5.7 L/min-7.5 L/min] 7.5 L/min CI:  [2.4 L/min/m2-3.2 L/min/m2] 3.2 L/min/m2  Intake/Output from previous day: 05/21 0701 - 05/22 0700 In: 5658.9 [P.O.:120; I.V.:4063.9; Blood:865; NG/GT:60; IV Piggyback:550] Out: 8469 [Urine:3550; Blood:2000; Chest Tube:330] Intake/Output this shift: Total I/O In: 230.7 [I.V.:180.7; IV Piggyback:50] Out: 530 [Urine:340; Chest Tube:190]       Exam    General- alert and comfortable   Lungs- clear without rales, wheezes   Cor- regular rate and rhythm, no murmur , gallop   Abdomen- soft, non-tender   Extremities - warm, non-tender, minimal edema   Neuro- oriented, appropriate, no focal weakness   Lab Results:  Recent Labs  09/10/16 0320 09/10/16 1645 09/10/16 1651 09/10/16 1701  WBC 16.1* 16.1*  --   --   HGB 14.0 12.5* 9.5* 12.9*  HCT 42.7  38.9* 28.0* 38.0*  PLT 147* 139*  --   --    BMET:  Recent Labs  09/09/16 0445  09/10/16 0320 09/10/16 1651 09/10/16 1701  NA 134*  < > 135 144 137  K 3.5  < > 4.0 2.4* 3.8  CL 102  < > 107 110 101  CO2 23  --  21*  --   --   GLUCOSE 151*  < > 131* 148* 201*  BUN 15  < > '10 9 13  '$ CREATININE 1.13  < > 0.92 0.50* 1.10  CALCIUM 9.2  --  8.0*  --   --   < > = values in this interval not displayed.  PT/INR:  Recent Labs  09/09/16 1545  LABPROT 14.9  INR 1.16   ABG    Component Value Date/Time   PHART 7.399 09/10/2016 0350   HCO3 20.8 09/10/2016 0350   TCO2 23 09/10/2016 1701   ACIDBASEDEF 3.2 (H) 09/10/2016 0350   O2SAT 94.0 09/10/2016 0350   CBG (last 3)   Recent Labs  09/10/16 1109 09/10/16 1301 09/10/16 1559  GLUCAP 129* 113* 193*    Assessment/Plan: S/P Procedure(s) (LRB): CORONARY ARTERY BYPASS GRAFTING times three  using left internal mammary artery and right saphenous vein harvest (N/A) TRANSESOPHAGEAL ECHOCARDIOGRAM (TEE) (N/A) VIDEO BRONCHOSCOPY (N/A) Diuresis Mobilization Leave chest tubes for now Glucose controlled sliding scale plus Lantus twice a day dosing   LOS: 8 days    [name] 09/10/2016

## 2016-09-10 NOTE — Care Management Note (Signed)
Case Management Note Marvetta Gibbons RN, BSN Unit 2W-Case Manager-- [place] coverage [phone]  Patient Details  Name: [name] MRN: [id number] Date of Birth: [birth year]  Subjective/Objective:  Pt admitted NSTEMI found 3VD, - s/p CABGx3 on 09/09/16- tx to [place] post op                  Action/Plan: PTA pt lived at home- independent- CM to follow for d/c needs.   Expected Discharge Date:                  Expected Discharge Plan:     In-House Referral:     Discharge planning Services  CM Consult  Post Acute Care Choice:    Choice offered to:     DME Arranged:    DME Agency:     HH Arranged:    HH Agency:     Status of Service:  In process, will continue to follow  If discussed at Long Length of Stay Meetings, dates discussed:    Discharge Disposition:   Additional Comments:  Dawayne Patricia, RN 09/10/2016, 9:54 AM

## 2016-09-10 NOTE — Op Note (Signed)
[name], [name] NO.:  [id number]  MEDICAL RECORD NO.:  [id number]  LOCATION:  ED                            FACILITY:  [facility]  PHYSICIAN:  Ivin Poot, M.D.  DATE OF BIRTH:  [birth year]  DATE OF PROCEDURE:  09/09/2016 DATE OF DISCHARGE:                              OPERATIVE REPORT   OPERATION: 1. Coronary artery bypass grafting x3 (left internal mammary artery to     LAD, saphenous vein graft to obtuse marginal, saphenous vein graft     to posterolateral branch of the right coronary). 2. Endoscopic harvest of right leg greater saphenous vein. 3. Fiberoptic bronchoscopy.  SURGEON:  Ivin Poot, MD.  ASSISTANT:  Nicholes Rough, PA-C.  PREOPERATIVE DIAGNOSES:  Non-ST-elevation myocardial infarction, severe multivessel coronary artery disease.  POSTOPERATIVE DIAGNOSES:  Non-ST-elevation myocardial infarction, severe multivessel coronary artery disease.  ANESTHESIA:  General by Dr. Marya Landry.  INDICATIONS:  The patient is a morbidly obese, 51 year old, poorly controlled diabetic, smoker, who presents with previous history of PCI and now with symptoms of unstable angina and mildly positive cardiac enzymes.  Cardiac catheterization demonstrated chronic occlusion of his LAD with collateralization from the right coronary which had significant proximal stenosis.  The OM branch previously treated with PCI also had significant stenosis.  LV systolic function was fairly well preserved and there were no valvular abnormalities on echo.  He was referred for surgical coronary revascularization.  Prior to surgery, I discussed the procedure of CABG for treatment of his CAD.  I discussed the indications, benefits, alternatives, and risks. He understood the risks of stroke, bleeding, blood transfusion requirement, MI, postoperative infection, postoperative pulmonary problems including pleural effusion, wound infection, and death.  After reviewing these  issues, he demonstrated his understanding and agreed to proceed with surgery under what I felt was an informed consent.  OPERATIVE FINDINGS: 1. Poor targets, diffusely diseased. 2. Saphenous vein was small and fragile, mammary artery had good flow. 3. No blood products required for the surgery. 4. Poor oxygen saturation after separation from cardiopulmonary     bypass.  An x-ray taken after the chest was closed, demonstrated     atelectasis of the right lower lobe.  The patient underwent     fiberoptic video bronchoscopy with removal of thick glue-like     secretions from the right upper lobe orifice.  These were sent for     cultures.  Follow up chest x-ray showed improvement of lung     aeration.  DESCRIPTION OF PROCEDURE:  The patient was brought to the operating and placed supine on the operating table, where general anesthesia was induced under invasive hemodynamic monitoring.  The chest, abdomen, and legs were prepped with Betadine and draped as a sterile field.  A transesophageal echo probe was placed by the anesthesiologist.  A proper time-out was performed.  A sternal incision was made as the saphenous vein was harvested endoscopically from the right leg.  The left internal mammary artery was harvested as a pedicle graft from its origin at the subclavian vessels.  It was a 1.5-mm vessel with excellent flow.  The sternal retractor was placed.  The  pericardium was opened and suspended.  There was a large amount of fat in the mediastinum.  The sternal deep blades were required due to the patient's obese body habitus.  Pursestrings were placed in the right atrium and ascending aorta and after the vein was harvested, the patient was heparinized. When the ACT was documented as being therapeutic, the patient was cannulated and placed on cardiopulmonary bypass.  The coronaries were identified for grafting and the mammary artery and vein grafts were prepared for the distal  anastomoses.  The cardioplegia cannulas were placed both antegrade and retrograde cold blood cardioplegia.  The patient was cooled to 32 degrees.  Aortic cross-clamp was applied.  One liter of cold blood cardioplegia was delivered in split doses between the antegrade aortic and retrograde coronary sinus catheters.  There was good cardioplegic arrest and septal temperature dropped to less than 12 degrees.  Cardioplegia was delivered every 20 minutes.  The distal coronary anastomoses were performed.  The first distal anastomosis was to the posterolateral branch of the right coronary. This was a larger branch in the posterior descending.  There was a proximal RCA stenosis of 80%.  A reversed saphenous vein was sewn end-to- side with running 7-0 Prolene with good flow through the graft. Cardioplegia was redosed.  The second distal anastomosis was the OM branch of left circumflex. This was heavily diseased and had a proximal 80% stenosis.  A reversed saphenous vein was sewn end-to-side with running 7-0 Prolene with good flow through the graft.  Cardioplegia was redosed.  The third distal anastomosis was to the distal third LAD.  It was totally occluded proximally.  It was 1.5 mm.  The left IMA pedicle was brought through an opening in the left lateral pericardium.  It was brought down onto the LAD and sewn end-to-side with running 8-0 Prolene. There was good flow through the anastomosis after briefly releasing the pedicle bulldog on the mammary artery.  The bulldog was reapplied and the pedicle was secured to epicardium.  Cardioplegia was redosed.  While the crossclamp was still in place, 2 proximal vein anastomoses were performed on the ascending aorta using a 4.5 mm punch and running 6- 0 Prolene.  Prior to removing the crossclamp, air was vented from the coronaries with a dose of retrograde warm blood cardioplegia.  The crossclamp was removed.  The vein grafts were de-aired and  opened, each had good flow.  The patient was rewarmed and reperfused.  There was some bleeding from the proximal right coronary artery proximal anastomosis.  Attempt at repairing this with sutures was unsuccessful because of poor quality of the vein, which tore and made the bleeding worse.  The patient was placed back on cardiopulmonary bypass for a short period of time and the proximal anastomosis was revised using a partial occluding clamp and a freshly trimmed site of the vein with running 6-0 Prolene.  After the partial clamp was removed, there were hemostasis and good flow through the graft.  As the patient was being rewarmed, temporary pacing wires were applied. The lungs were expanded and ventilator was resumed.  The patient was weaned off cardiopulmonary bypass with stable hemodynamics.  Echo showed good LV function.  Protamine was administered without adverse reaction. The cannulas were removed.  The mediastinum was irrigated.  The superior pericardial fat was closed over the aorta.  Anterior mediastinal and left pleural chest tubes were placed and brought out through separate incisions.  The sternum was closed with wire.  The patient  remained stable.  The pectoralis fascia was closed with a running #1 Vicryl.  The subcutaneous and skin layers were closed in running Vicryl.  As mentioned previously in the op note, a chest x-ray taken after the dressings were placed, demonstrated retained secretions and atelectasis of the right upper lobe which was treated with video bronchoscopy, which removed a large amount of glue-like secretions from the right upper lobe with subsequent improved aeration of the right upper lobe and oxygen saturations.     Ivin Poot, M.D.     PV/MEDQ  D:  09/09/2016  T:  09/10/2016  Job:  [id number]  cc:   Lauree Chandler, MD

## 2016-09-10 NOTE — Plan of Care (Signed)
Problem: Cardiac: Goal: Hemodynamic stability will improve Outcome: Progressing Pt on dopamine gtt Goal: Ability to maintain an adequate cardiac output will improve Outcome: Completed/Met Date Met: 09/10/16 CI>2 Goal: Will show no signs and symptoms of excessive bleeding Outcome: Progressing Minimal chest tube drainage  Problem: Physical Regulation: Goal: Diagnostic test results will improve Outcome: Progressing Labs within normal limits  Problem: Respiratory: Goal: Levels of oxygenation will improve Outcome: Progressing O2 sats maintained > 95 Goal: Respiratory status will improve Outcome: Progressing Pt extubated 5/21 Goal: Ability to tolerate decreased levels of ventilator support will improve Outcome: Completed/Met Date Met: 09/10/16 Pt extubated  Problem: Pain Management: Goal: Pain level will decrease Outcome: Progressing Pain managed with iv and PO analgesia

## 2016-09-10 NOTE — Progress Notes (Signed)
Patient ID: [name], male   DOB: [birth year], 51 y.o.   MRN: [id number]  SICU Evening Rounds:  Hemodynamically stable resting sinus tachy 115 on dop 2.  Dop weaned off today but urine output dropped and put back on it.  BMET    Component Value Date/Time   NA 137 09/10/2016 1701   K 3.8 09/10/2016 1701   CL 101 09/10/2016 1701   CO2 21 (L) 09/10/2016 0320   GLUCOSE 201 (H) 09/10/2016 1701   BUN 13 09/10/2016 1701   CREATININE 1.10 09/10/2016 1701   CALCIUM 8.0 (L) 09/10/2016 0320   GFRNONAA >60 09/10/2016 1645   GFRAA >60 09/10/2016 1645   Sats 89% on 6L. Improved with IS. Had bronch in OR to remove plugs so will need to keep working on IS, coughing.  CBC    Component Value Date/Time   WBC 16.1 (H) 09/10/2016 1645   RBC 4.51 09/10/2016 1645   HGB 12.9 (L) 09/10/2016 1701   HCT 38.0 (L) 09/10/2016 1701   PLT 139 (L) 09/10/2016 1645   MCV 86.3 09/10/2016 1645   MCH 27.7 09/10/2016 1645   MCHC 32.1 09/10/2016 1645   RDW 13.6 09/10/2016 1645   LYMPHSABS 2.9 09/02/2016 2235   MONOABS 0.8 09/02/2016 2235   EOSABS 0.1 09/02/2016 2235   BASOSABS 0.0 09/02/2016 2235

## 2016-09-11 ENCOUNTER — Inpatient Hospital Stay (HOSPITAL_COMMUNITY): Payer: 59

## 2016-09-11 LAB — GLUCOSE, CAPILLARY
GLUCOSE-CAPILLARY: 144 mg/dL — AB (ref 65–99)
GLUCOSE-CAPILLARY: 162 mg/dL — AB (ref 65–99)
GLUCOSE-CAPILLARY: 175 mg/dL — AB (ref 65–99)
Glucose-Capillary: 121 mg/dL — ABNORMAL HIGH (ref 65–99)
Glucose-Capillary: 145 mg/dL — ABNORMAL HIGH (ref 65–99)
Glucose-Capillary: 154 mg/dL — ABNORMAL HIGH (ref 65–99)
Glucose-Capillary: 166 mg/dL — ABNORMAL HIGH (ref 65–99)
Glucose-Capillary: 181 mg/dL — ABNORMAL HIGH (ref 65–99)

## 2016-09-11 LAB — CULTURE, RESPIRATORY W GRAM STAIN
Culture: NO GROWTH
Culture: NORMAL

## 2016-09-11 LAB — BASIC METABOLIC PANEL
Anion gap: 9 (ref 5–15)
BUN: 13 mg/dL (ref 6–20)
CO2: 25 mmol/L (ref 22–32)
Calcium: 8.5 mg/dL — ABNORMAL LOW (ref 8.9–10.3)
Chloride: 103 mmol/L (ref 101–111)
Creatinine, Ser: 1.12 mg/dL (ref 0.61–1.24)
GFR calc Af Amer: >60 mL/min (ref >60)
GFR calc non Af Amer: >60 mL/min (ref >60)
Glucose, Bld: 172 mg/dL — ABNORMAL HIGH (ref 65–99)
Potassium: 4 mmol/L (ref 3.5–5.1)
Sodium: 137 mmol/L (ref 135–145)

## 2016-09-11 LAB — CBC
HCT: 38.4 % — ABNORMAL LOW (ref 39.0–52.0)
Hemoglobin: 12.4 g/dL — ABNORMAL LOW (ref 13.0–17.0)
MCH: 28.1 pg (ref 26.0–34.0)
MCHC: 32.3 g/dL (ref 30.0–36.0)
MCV: 86.9 fL (ref 78.0–100.0)
Platelets: 147 10*3/uL — ABNORMAL LOW (ref 150–400)
RBC: 4.42 MIL/uL (ref 4.22–5.81)
RDW: 13.9 % (ref 11.5–15.5)
WBC: 18 10*3/uL — ABNORMAL HIGH (ref 4.0–10.5)

## 2016-09-11 LAB — ACID FAST SMEAR (AFB, MYCOBACTERIA): Acid Fast Smear: NEGATIVE

## 2016-09-11 LAB — POCT I-STAT, CHEM 8
BUN: 16 mg/dL (ref 6–20)
CALCIUM ION: 1.21 mmol/L (ref 1.15–1.40)
CHLORIDE: 97 mmol/L — AB (ref 101–111)
CREATININE: 1.1 mg/dL (ref 0.61–1.24)
GLUCOSE: 166 mg/dL — AB (ref 65–99)
HCT: 38 % — ABNORMAL LOW (ref 39.0–52.0)
Hemoglobin: 12.9 g/dL — ABNORMAL LOW (ref 13.0–17.0)
POTASSIUM: 3.8 mmol/L (ref 3.5–5.1)
Sodium: 136 mmol/L (ref 135–145)
TCO2: 24 mmol/L (ref 0–100)

## 2016-09-11 MED ORDER — FUROSEMIDE 10 MG/ML IJ SOLN
80.0000 mg | Freq: Once | INTRAMUSCULAR | Status: AC
  Administered 2016-09-11: 80 mg via INTRAVENOUS
  Filled 2016-09-11: qty 8

## 2016-09-11 MED ORDER — POTASSIUM CHLORIDE 10 MEQ/50ML IV SOLN
10.0000 meq | Freq: Once | INTRAVENOUS | Status: AC
  Administered 2016-09-11: 10 meq via INTRAVENOUS
  Filled 2016-09-11: qty 50

## 2016-09-11 MED ORDER — FUROSEMIDE 10 MG/ML IJ SOLN: 80.0000 mg | Freq: Every day | INTRAMUSCULAR | Status: DC

## 2016-09-11 MED ORDER — DOPAMINE-DEXTROSE 3.2-5 MG/ML-% IV SOLN: 1.0000 ug/kg/min | INTRAVENOUS | Status: DC

## 2016-09-11 MED ORDER — METOLAZONE 5 MG PO TABS
10.0000 mg | ORAL_TABLET | Freq: Once | ORAL | Status: AC
  Administered 2016-09-11: 10 mg via ORAL
  Filled 2016-09-11: qty 2

## 2016-09-11 NOTE — Progress Notes (Signed)
CT surgery p.m. Rounds  Patient ambulated 300 feet He remains tachycardic Nonrebreather oxygen mask Needs more diuresis Lasix 80 mg IV with metolazone 10 mg by mouth ordered for this evening Wean off dopamine for tachycardia P.m. labs satisfactory

## 2016-09-11 NOTE — Progress Notes (Signed)
2 Days Post-Op Procedure(s) (LRB): CORONARY ARTERY BYPASS GRAFTING times three  using left internal mammary artery and right saphenous vein harvest (N/A) TRANSESOPHAGEAL ECHOCARDIOGRAM (TEE) (N/A) VIDEO BRONCHOSCOPY (N/A) Subjective: Patient remains with marginal pulmonary status after multivessel CABG Sinus tachycardia heart rate 100-105 bbm Diuresing with IV Lasix and renal dose dopamine Chest x-ray with atelectasis, mild edema Blood sugars well-controlled with Lantus plus sliding scale Objective: Vital signs in last 24 hours: Temp:  [98 F (36.7 C)-98.5 F (36.9 C)] 98 F (36.7 C) (05/23 0726) Pulse Rate:  [93-117] 104 (05/23 0700) Cardiac Rhythm: Sinus tachycardia (05/23 0720) Resp:  [18-30] 23 (05/23 0700) BP: (99-148)/(57-84) 108/78 (05/23 0700) SpO2:  [89 %-96 %] 95 % (05/23 0846) FiO2 (%):  [55 %] 55 % (05/22 1923) Weight:  [281 lb 3.2 oz (127.6 kg)] 281 lb 3.2 oz (127.6 kg) (05/23 0600)  Hemodynamic parameters for last 24 hours:    Intake/Output from previous day: 05/22 0701 - 05/23 0700 In: 470.5 [I.V.:370.5; IV Piggyback:100] Out: 2005 [Urine:1635; Chest Tube:370] Intake/Output this shift: Total I/O In: 3 [I.V.:3] Out: -        Exam    General- alert and comfortable   Lungs- clear without rales, wheezes   Cor- regular rate and rhythm, no murmur , gallop   Abdomen- soft, non-tender   Extremities - warm, non-tender, minimal edema   Neuro- oriented, appropriate, no focal weakness   Lab Results:  Recent Labs  09/10/16 1645  09/10/16 1701 09/11/16 0538  WBC 16.1*  --   --  18.0*  HGB 12.5*  < > 12.9* 12.4*  HCT 38.9*  < > 38.0* 38.4*  PLT 139*  --   --  147*  < > = values in this interval not displayed. BMET:  Recent Labs  09/10/16 0320  09/10/16 1701 09/11/16 0538  NA 135  < > 137 137  K 4.0  < > 3.8 4.0  CL 107  < > 101 103  CO2 21*  --   --  25  GLUCOSE 131*  < > 201* 172*  BUN 10  < > 13 13  CREATININE 0.92  < > 1.10 1.12  CALCIUM 8.0*   --   --  8.5*  < > = values in this interval not displayed.  PT/INR:  Recent Labs  09/09/16 1545  LABPROT 14.9  INR 1.16   ABG    Component Value Date/Time   PHART 7.399 09/10/2016 0350   HCO3 20.8 09/10/2016 0350   TCO2 23 09/10/2016 1701   ACIDBASEDEF 3.2 (H) 09/10/2016 0350   O2SAT 94.0 09/10/2016 0350   CBG (last 3)   Recent Labs  09/11/16 0015 09/11/16 0542 09/11/16 0721  GLUCAP 175* 181* 166*    Assessment/Plan: S/P Procedure(s) (LRB): CORONARY ARTERY BYPASS GRAFTING times three  using left internal mammary artery and right saphenous vein harvest (N/A) TRANSESOPHAGEAL ECHOCARDIOGRAM (TEE) (N/A) VIDEO BRONCHOSCOPY (N/A) Mobilize Diuresis Diabetes control d/c tubes/lines Remove both chest tubes, continue IV diuretic diuresis Keep in ICU because of pulmonary status  LOS: 9 days    Tharon Aquas Trigt III 09/11/2016

## 2016-09-12 ENCOUNTER — Inpatient Hospital Stay (HOSPITAL_COMMUNITY): Payer: 59

## 2016-09-12 LAB — BPAM RBC
Blood Product Expiration Date: 201806072359
Blood Product Expiration Date: 201806072359
Blood Product Expiration Date: 201806072359
Blood Product Expiration Date: 201806072359
Unit Type and Rh: 6200
Unit Type and Rh: 6200
Unit Type and Rh: 6200
Unit Type and Rh: 6200

## 2016-09-12 LAB — TYPE AND SCREEN
ABO/RH(D): A POS
Antibody Screen: POSITIVE
DAT, IgG: NEGATIVE
Donor AG Type: NEGATIVE
Donor AG Type: NEGATIVE
Donor AG Type: NEGATIVE
Donor AG Type: NEGATIVE
PT AG Type: NEGATIVE
Unit division: 0
Unit division: 0
Unit division: 0
Unit division: 0

## 2016-09-12 LAB — CBC
HCT: 37.3 % — ABNORMAL LOW (ref 39.0–52.0)
Hemoglobin: 11.9 g/dL — ABNORMAL LOW (ref 13.0–17.0)
MCH: 27.9 pg (ref 26.0–34.0)
MCHC: 31.9 g/dL (ref 30.0–36.0)
MCV: 87.6 fL (ref 78.0–100.0)
Platelets: 164 10*3/uL (ref 150–400)
RBC: 4.26 MIL/uL (ref 4.22–5.81)
RDW: 13.9 % (ref 11.5–15.5)
WBC: 16.5 10*3/uL — ABNORMAL HIGH (ref 4.0–10.5)

## 2016-09-12 LAB — POCT I-STAT, CHEM 8
BUN: 9 mg/dL (ref 6–20)
Calcium, Ion: 0.92 mmol/L — ABNORMAL LOW (ref 1.15–1.40)
Chloride: 110 mmol/L (ref 101–111)
Creatinine, Ser: 0.5 mg/dL — ABNORMAL LOW (ref 0.61–1.24)
Glucose, Bld: 148 mg/dL — ABNORMAL HIGH (ref 65–99)
HCT: 28 % — ABNORMAL LOW (ref 39.0–52.0)
Hemoglobin: 9.5 g/dL — ABNORMAL LOW (ref 13.0–17.0)
Potassium: 2.4 mmol/L — CL (ref 3.5–5.1)
Sodium: 144 mmol/L (ref 135–145)
TCO2: 16 mmol/L (ref 0–100)

## 2016-09-12 LAB — BASIC METABOLIC PANEL
Anion gap: 13 (ref 5–15)
BUN: 20 mg/dL (ref 6–20)
CO2: 27 mmol/L (ref 22–32)
Calcium: 8.7 mg/dL — ABNORMAL LOW (ref 8.9–10.3)
Chloride: 93 mmol/L — ABNORMAL LOW (ref 101–111)
Creatinine, Ser: 1.27 mg/dL — ABNORMAL HIGH (ref 0.61–1.24)
GFR calc Af Amer: >60 mL/min (ref >60)
GFR calc non Af Amer: >60 mL/min (ref >60)
Glucose, Bld: 168 mg/dL — ABNORMAL HIGH (ref 65–99)
Potassium: 3.1 mmol/L — ABNORMAL LOW (ref 3.5–5.1)
Sodium: 133 mmol/L — ABNORMAL LOW (ref 135–145)

## 2016-09-12 LAB — COMPREHENSIVE METABOLIC PANEL
ALT: 35 U/L (ref 17–63)
AST: 28 U/L (ref 15–41)
Albumin: 2.9 g/dL — ABNORMAL LOW (ref 3.5–5.0)
Alkaline Phosphatase: 68 U/L (ref 38–126)
Anion gap: 9 (ref 5–15)
BUN: 15 mg/dL (ref 6–20)
CO2: 29 mmol/L (ref 22–32)
Calcium: 8.5 mg/dL — ABNORMAL LOW (ref 8.9–10.3)
Chloride: 98 mmol/L — ABNORMAL LOW (ref 101–111)
Creatinine, Ser: 1.14 mg/dL (ref 0.61–1.24)
GFR calc Af Amer: >60 mL/min (ref >60)
GFR calc non Af Amer: >60 mL/min (ref >60)
Glucose, Bld: 141 mg/dL — ABNORMAL HIGH (ref 65–99)
Potassium: 3.3 mmol/L — ABNORMAL LOW (ref 3.5–5.1)
Sodium: 136 mmol/L (ref 135–145)
Total Bilirubin: 0.8 mg/dL (ref 0.3–1.2)
Total Protein: 5.8 g/dL — ABNORMAL LOW (ref 6.5–8.1)

## 2016-09-12 LAB — GLUCOSE, CAPILLARY
GLUCOSE-CAPILLARY: 142 mg/dL — AB (ref 65–99)
GLUCOSE-CAPILLARY: 145 mg/dL — AB (ref 65–99)
GLUCOSE-CAPILLARY: 173 mg/dL — AB (ref 65–99)
GLUCOSE-CAPILLARY: 181 mg/dL — AB (ref 65–99)
GLUCOSE-CAPILLARY: 194 mg/dL — AB (ref 65–99)

## 2016-09-12 MED ORDER — POTASSIUM CHLORIDE CRYS ER 20 MEQ PO TBCR
40.0000 meq | EXTENDED_RELEASE_TABLET | Freq: Once | ORAL | Status: AC
  Administered 2016-09-12: 40 meq via ORAL
  Filled 2016-09-12: qty 2

## 2016-09-12 MED ORDER — METOPROLOL TARTRATE 25 MG PO TABS
25.0000 mg | ORAL_TABLET | Freq: Two times a day (BID) | ORAL | Status: DC
  Administered 2016-09-12 – 2016-09-15 (×7): 25 mg via ORAL
  Filled 2016-09-12 (×7): qty 1

## 2016-09-12 MED ORDER — METOLAZONE 5 MG PO TABS
10.0000 mg | ORAL_TABLET | Freq: Every day | ORAL | Status: DC
  Administered 2016-09-12: 10 mg via ORAL
  Filled 2016-09-12: qty 2

## 2016-09-12 MED ORDER — DEXTROSE 5 % IV SOLN
1.0000 g | Freq: Three times a day (TID) | INTRAVENOUS | Status: DC
  Administered 2016-09-12 – 2016-09-17 (×17): 1 g via INTRAVENOUS
  Filled 2016-09-12 (×20): qty 1

## 2016-09-12 MED ORDER — AMIODARONE HCL IN DEXTROSE 360-4.14 MG/200ML-% IV SOLN
30.0000 mg/h | INTRAVENOUS | Status: DC
  Administered 2016-09-13 – 2016-09-14 (×4): 30 mg/h via INTRAVENOUS
  Filled 2016-09-12 (×5): qty 200

## 2016-09-12 MED ORDER — SORBITOL 70 % SOLN
45.0000 mL | Freq: Once | Status: AC
  Administered 2016-09-12: 45 mL via ORAL
  Filled 2016-09-12: qty 60

## 2016-09-12 MED ORDER — AMIODARONE HCL IN DEXTROSE 360-4.14 MG/200ML-% IV SOLN
INTRAVENOUS | Status: AC
  Filled 2016-09-12: qty 200

## 2016-09-12 MED ORDER — AMIODARONE HCL IN DEXTROSE 360-4.14 MG/200ML-% IV SOLN
60.0000 mg/h | INTRAVENOUS | Status: AC
  Administered 2016-09-12 – 2016-09-13 (×2): 60 mg/h via INTRAVENOUS

## 2016-09-12 MED ORDER — AMIODARONE HCL 200 MG PO TABS
400.0000 mg | ORAL_TABLET | Freq: Once | ORAL | Status: AC
  Administered 2016-09-12: 400 mg via ORAL
  Filled 2016-09-12: qty 2

## 2016-09-12 MED ORDER — POTASSIUM CHLORIDE 10 MEQ/50ML IV SOLN
10.0000 meq | INTRAVENOUS | Status: AC
  Administered 2016-09-12 (×3): 10 meq via INTRAVENOUS
  Filled 2016-09-12 (×3): qty 50

## 2016-09-12 MED ORDER — GUAIFENESIN ER 600 MG PO TB12
600.0000 mg | ORAL_TABLET | Freq: Two times a day (BID) | ORAL | Status: DC
  Administered 2016-09-12 – 2016-09-19 (×15): 600 mg via ORAL
  Filled 2016-09-12 (×15): qty 1

## 2016-09-12 MED ORDER — FUROSEMIDE 10 MG/ML IJ SOLN
80.0000 mg | Freq: Two times a day (BID) | INTRAMUSCULAR | Status: DC
  Administered 2016-09-12 (×2): 80 mg via INTRAVENOUS
  Filled 2016-09-12 (×2): qty 8

## 2016-09-12 NOTE — Progress Notes (Addendum)
[facility] BRIEF SICU PROGRESS NOTE  3 Days Post-Op  S/P Procedure(s) (LRB): CORONARY ARTERY BYPASS GRAFTING times three  using left internal mammary artery and right saphenous vein harvest (N/A) TRANSESOPHAGEAL ECHOCARDIOGRAM (TEE) (N/A) VIDEO BRONCHOSCOPY (N/A)   Currently in rapid Afib w/ stable BP Receiving IV amiodarone with extra loading dose given orally Breathing comfortably w/ O2 sats 96% on 4 L/min UOP excellent  Plan: Continue current plan.  Will consider adding IV diltiazem for rate control if he doesn't convert and HR doesn't improve  Rexene Alberts, MD 09/12/2016 7:08 PM

## 2016-09-12 NOTE — Progress Notes (Signed)
3 Days Post-Op Procedure(s) (LRB): CORONARY ARTERY BYPASS GRAFTING times three  using left internal mammary artery and right saphenous vein harvest (N/A) TRANSESOPHAGEAL ECHOCARDIOGRAM (TEE) (N/A) VIDEO BRONCHOSCOPY (N/A) Subjective: CABG Smoker poss pneumonia- Fortaz started Needs more diuresis Wean O2 as tolerated Objective: Vital signs in last 24 hours: Temp:  [98.1 F (36.7 C)-99.1 F (37.3 C)] 98.8 F (37.1 C) (05/24 0726) Pulse Rate:  [95-118] 105 (05/24 0700) Cardiac Rhythm: Sinus tachycardia (05/24 0400) Resp:  [17-30] 25 (05/24 0700) BP: (105-141)/(63-97) 112/63 (05/24 0700) SpO2:  [95 %-98 %] 98 % (05/23 1918) Weight:  [275 lb 5.7 oz (124.9 kg)] 275 lb 5.7 oz (124.9 kg) (05/24 0600)  Hemodynamic parameters for last 24 hours:   stable Intake/Output from previous day: 05/23 0701 - 05/24 0700 In: 819.2 [P.O.:480; I.V.:289.2; IV Piggyback:50] Out: 2555 [JOITG:5498; Chest Tube:90] Intake/Output this shift: No intake/output data recorded.       Exam    General- alert and comfortable   Lungs- coarse breath sounds   Cor- regular rate and rhythm, no murmur , gallop   Abdomen- soft, non-tender   Extremities - warm, non-tender, minimal edema   Neuro- oriented, appropriate, no focal weakness   Lab Results:  Recent Labs  09/11/16 0538 09/11/16 1607 09/12/16 0407  WBC 18.0*  --  16.5*  HGB 12.4* 12.9* 11.9*  HCT 38.4* 38.0* 37.3*  PLT 147*  --  164   BMET:  Recent Labs  09/11/16 0538 09/11/16 1607 09/12/16 0407  NA 137 136 136  K 4.0 3.8 3.3*  CL 103 97* 98*  CO2 25  --  29  GLUCOSE 172* 166* 141*  BUN 13 16 15   CREATININE 1.12 1.10 1.14  CALCIUM 8.5*  --  8.5*    PT/INR:  Recent Labs  09/09/16 1545  LABPROT 14.9  INR 1.16   ABG    Component Value Date/Time   PHART 7.399 09/10/2016 0350   HCO3 20.8 09/10/2016 0350   TCO2 24 09/11/2016 1607   ACIDBASEDEF 3.2 (H) 09/10/2016 0350   O2SAT 94.0 09/10/2016 0350   CBG (last 3)   Recent  Labs  09/11/16 1929 09/11/16 2331 09/12/16 0345  GLUCAP 144* 145* 142*    Assessment/Plan: S/P Procedure(s) (LRB): CORONARY ARTERY BYPASS GRAFTING times three  using left internal mammary artery and right saphenous vein harvest (N/A) TRANSESOPHAGEAL ECHOCARDIOGRAM (TEE) (N/A) VIDEO BRONCHOSCOPY (N/A) Mobilize Diuresis Diabetes control lasix 80 bid   LOS: 10 days    Tharon Aquas Trigt III 09/12/2016

## 2016-09-13 ENCOUNTER — Inpatient Hospital Stay (HOSPITAL_COMMUNITY): Payer: 59

## 2016-09-13 LAB — BASIC METABOLIC PANEL
Anion gap: 12 (ref 5–15)
BUN: 26 mg/dL — ABNORMAL HIGH (ref 6–20)
CO2: 27 mmol/L (ref 22–32)
Calcium: 8.6 mg/dL — ABNORMAL LOW (ref 8.9–10.3)
Chloride: 93 mmol/L — ABNORMAL LOW (ref 101–111)
Creatinine, Ser: 1.33 mg/dL — ABNORMAL HIGH (ref 0.61–1.24)
GFR calc Af Amer: >60 mL/min (ref >60)
GFR calc non Af Amer: 60 mL/min — ABNORMAL LOW (ref >60)
Glucose, Bld: 205 mg/dL — ABNORMAL HIGH (ref 65–99)
Potassium: 3.1 mmol/L — ABNORMAL LOW (ref 3.5–5.1)
Sodium: 132 mmol/L — ABNORMAL LOW (ref 135–145)

## 2016-09-13 LAB — COMPREHENSIVE METABOLIC PANEL
ALT: 36 U/L (ref 17–63)
AST: 31 U/L (ref 15–41)
Albumin: 2.9 g/dL — ABNORMAL LOW (ref 3.5–5.0)
Alkaline Phosphatase: 107 U/L (ref 38–126)
Anion gap: 10 (ref 5–15)
BUN: 24 mg/dL — ABNORMAL HIGH (ref 6–20)
CO2: 32 mmol/L (ref 22–32)
Calcium: 8.8 mg/dL — ABNORMAL LOW (ref 8.9–10.3)
Chloride: 93 mmol/L — ABNORMAL LOW (ref 101–111)
Creatinine, Ser: 1.43 mg/dL — ABNORMAL HIGH (ref 0.61–1.24)
GFR calc Af Amer: >60 mL/min (ref >60)
GFR calc non Af Amer: 55 mL/min — ABNORMAL LOW (ref >60)
Glucose, Bld: 126 mg/dL — ABNORMAL HIGH (ref 65–99)
Potassium: 3.5 mmol/L (ref 3.5–5.1)
Sodium: 135 mmol/L (ref 135–145)
Total Bilirubin: 0.6 mg/dL (ref 0.3–1.2)
Total Protein: 6.1 g/dL — ABNORMAL LOW (ref 6.5–8.1)

## 2016-09-13 LAB — GLUCOSE, CAPILLARY
GLUCOSE-CAPILLARY: 182 mg/dL — AB (ref 65–99)
GLUCOSE-CAPILLARY: 139 mg/dL — AB (ref 65–99)
Glucose-Capillary: 118 mg/dL — ABNORMAL HIGH (ref 65–99)
Glucose-Capillary: 127 mg/dL — ABNORMAL HIGH (ref 65–99)
Glucose-Capillary: 156 mg/dL — ABNORMAL HIGH (ref 65–99)
Glucose-Capillary: 172 mg/dL — ABNORMAL HIGH (ref 65–99)
Glucose-Capillary: 185 mg/dL — ABNORMAL HIGH (ref 65–99)

## 2016-09-13 LAB — CBC
HCT: 37.6 % — ABNORMAL LOW (ref 39.0–52.0)
Hemoglobin: 12.4 g/dL — ABNORMAL LOW (ref 13.0–17.0)
MCH: 28.4 pg (ref 26.0–34.0)
MCHC: 33 g/dL (ref 30.0–36.0)
MCV: 86.2 fL (ref 78.0–100.0)
Platelets: 182 10*3/uL (ref 150–400)
RBC: 4.36 MIL/uL (ref 4.22–5.81)
RDW: 13.6 % (ref 11.5–15.5)
WBC: 14.3 10*3/uL — ABNORMAL HIGH (ref 4.0–10.5)

## 2016-09-13 MED ORDER — AMIODARONE IV BOLUS ONLY 150 MG/100ML
150.0000 mg | Freq: Once | INTRAVENOUS | Status: AC
  Administered 2016-09-13: 150 mg via INTRAVENOUS

## 2016-09-13 MED ORDER — DILTIAZEM HCL-DEXTROSE 100-5 MG/100ML-% IV SOLN (PREMIX)
10.0000 mg/h | INTRAVENOUS | Status: DC
  Administered 2016-09-13 – 2016-09-14 (×5): 10 mg/h via INTRAVENOUS
  Filled 2016-09-13 (×5): qty 100

## 2016-09-13 MED ORDER — POTASSIUM CHLORIDE CRYS ER 20 MEQ PO TBCR
40.0000 meq | EXTENDED_RELEASE_TABLET | Freq: Two times a day (BID) | ORAL | Status: DC
  Administered 2016-09-13 (×2): 40 meq via ORAL
  Filled 2016-09-13 (×2): qty 2

## 2016-09-13 MED ORDER — METOLAZONE 5 MG PO TABS
10.0000 mg | ORAL_TABLET | Freq: Every day | ORAL | Status: DC
  Administered 2016-09-13 – 2016-09-15 (×3): 10 mg via ORAL
  Filled 2016-09-13 (×3): qty 2

## 2016-09-13 MED ORDER — ENOXAPARIN SODIUM 40 MG/0.4ML ~~LOC~~ SOLN
40.0000 mg | Freq: Every day | SUBCUTANEOUS | Status: DC
  Administered 2016-09-13 – 2016-09-15 (×3): 40 mg via SUBCUTANEOUS
  Filled 2016-09-13 (×3): qty 0.4

## 2016-09-13 MED ORDER — POTASSIUM CHLORIDE CRYS ER 20 MEQ PO TBCR
40.0000 meq | EXTENDED_RELEASE_TABLET | Freq: Once | ORAL | Status: AC
  Administered 2016-09-13: 40 meq via ORAL
  Filled 2016-09-13: qty 2

## 2016-09-13 MED ORDER — FUROSEMIDE 10 MG/ML IJ SOLN
40.0000 mg | Freq: Two times a day (BID) | INTRAMUSCULAR | Status: DC
  Administered 2016-09-13 – 2016-09-15 (×4): 40 mg via INTRAVENOUS
  Filled 2016-09-13 (×5): qty 4

## 2016-09-13 NOTE — Progress Notes (Signed)
Patient ID: [name], male   DOB: [birth year], 51 y.o.   MRN: [id number] EVENING ROUNDS NOTE :     [address]       [place] [address]             [phone]                 4 Days Post-Op Procedure(s) (LRB): CORONARY ARTERY BYPASS GRAFTING times three  using left internal mammary artery and right saphenous vein harvest (N/A) TRANSESOPHAGEAL ECHOCARDIOGRAM (TEE) (N/A) VIDEO BRONCHOSCOPY (N/A)  Total Length of Stay:  LOS: 11 days  BP 112/66 (BP Location: Left Arm)   Pulse 85   Temp 97.8 F (36.6 C) (Oral)   Resp 18   Ht 5\' 9"  (1.753 m)   Wt 270 lb 8.1 oz (122.7 kg)   SpO2 92%   BMI 39.95 kg/m   .Intake/Output      05/25 0701 - 05/26 0700   P.O. 360   I.V. (mL/kg) 261.8 (2.1)   IV Piggyback 50   Total Intake(mL/kg) 671.8 (5.5)   Urine (mL/kg/hr) 875 (0.6)   Total Output 875   Net -203.2         . sodium chloride Stopped (09/10/16 0900)  . sodium chloride Stopped (09/10/16 0800)  . sodium chloride Stopped (09/09/16 2000)  . amiodarone 30 mg/hr (09/13/16 1500)  . cefTAZidime (FORTAZ)  IV Stopped (09/13/16 1626)  . diltiazem (CARDIZEM) infusion 10 mg/hr (09/13/16 1559)  . lactated ringers 10 mL/hr at 09/12/16 0100  . lactated ringers       Lab Results  Component Value Date   WBC 14.3 (H) 09/13/2016   HGB 12.4 (L) 09/13/2016   HCT 37.6 (L) 09/13/2016   PLT 182 09/13/2016   GLUCOSE 205 (H) 09/13/2016   CHOL 116 09/08/2016   TRIG 189 (H) 09/08/2016   HDL 28 (L) 09/08/2016   LDLDIRECT 74.0 04/12/2011   LDLCALC 50 09/08/2016   ALT 36 09/13/2016   AST 31 09/13/2016   NA 132 (L) 09/13/2016   K 3.1 (L) 09/13/2016   CL 93 (L) 09/13/2016   CREATININE 1.33 (H) 09/13/2016   BUN 26 (H) 09/13/2016   CO2 27 09/13/2016   TSH 4.584 (H) 09/06/2016   INR 1.16 09/09/2016   HGBA1C 10.6 (H) 09/06/2016   Alert and awake, walked in unit today Still afib, rate decreased to 90  On Cardizem and Cordarone  k being replace  [name] MD  Beeper  [phone] Office [phone] 09/13/2016 7:04 PM

## 2016-09-13 NOTE — Progress Notes (Signed)
4 Days Post-Op Procedure(s) (LRB): CORONARY ARTERY BYPASS GRAFTING times three  using left internal mammary artery and right saphenous vein harvest (N/A) TRANSESOPHAGEAL ECHOCARDIOGRAM (TEE) (N/A) VIDEO BRONCHOSCOPY (N/A) Subjective: Status post CABG Heavy smoking history HIV positive history Poorly controlled diabetes history  Patient with acute respiratory failure requiring high levels of oxygen immediately after surgery. The patient had heavy secretions with atelectasis of the right upper lobe and required bronchoscopy in the operating room at the end of his CABG procedure. The patient is now on 4-6 liters nasal cannula after diuresis.  Patient developed rapid atrial fibrillation last night now on amiodarone and IV Cardizem with heart rate of 90-100 still in atrial fibrillation  We'll not start Coumadin but maintain Lovenox for now.  Objective: Vital signs in last 24 hours: Temp:  [97.8 F (36.6 C)-98.8 F (37.1 C)] 97.8 F (36.6 C) (05/25 1548) Pulse Rate:  [72-154] 90 (05/25 1700) Cardiac Rhythm: Atrial fibrillation (05/25 0800) Resp:  [15-25] 17 (05/25 1700) BP: (94-132)/(55-108) 105/74 (05/25 1700) SpO2:  [90 %-100 %] 95 % (05/25 1700) Weight:  [270 lb 8.1 oz (122.7 kg)] 270 lb 8.1 oz (122.7 kg) (05/25 0600)  Hemodynamic parameters for last 24 hours:    Intake/Output from previous day: 05/24 0701 - 05/25 0700 In: 1660 [P.O.:1200; I.V.:260; IV Piggyback:200] Out: 4600 [Urine:4600] Intake/Output this shift: Total I/O In: 525.1 [P.O.:240; I.V.:235.1; IV Piggyback:50] Out: 425 [Urine:425]  Exam  Comfortable up in chair Scattered rhonchi Mild pedal edema Abdomen soft Sternal incision clean Neuro intact  Lab Results:  Recent Labs  09/12/16 0407 09/13/16 0313  WBC 16.5* 14.3*  HGB 11.9* 12.4*  HCT 37.3* 37.6*  PLT 164 182   BMET:  Recent Labs  09/13/16 0313 09/13/16 1506  NA 135 132*  K 3.5 3.1*  CL 93* 93*  CO2 32 27  GLUCOSE 126* 205*  BUN 24*  26*  CREATININE 1.43* 1.33*  CALCIUM 8.8* 8.6*    PT/INR: No results for input(s): LABPROT, INR in the last 72 hours. ABG    Component Value Date/Time   PHART 7.399 09/10/2016 0350   HCO3 20.8 09/10/2016 0350   TCO2 24 09/11/2016 1607   ACIDBASEDEF 3.2 (H) 09/10/2016 0350   O2SAT 94.0 09/10/2016 0350   CBG (last 3)   Recent Labs  09/13/16 0725 09/13/16 1108 09/13/16 1535  GLUCAP 127* 185* 182*    Assessment/Plan: S/P Procedure(s) (LRB): CORONARY ARTERY BYPASS GRAFTING times three  using left internal mammary artery and right saphenous vein harvest (N/A) TRANSESOPHAGEAL ECHOCARDIOGRAM (TEE) (N/A) VIDEO BRONCHOSCOPY (N/A) Continue IV amiodarone and IV Cardizem Continue diuretics  LOS: 11 days    [name] 09/13/2016

## 2016-09-14 ENCOUNTER — Inpatient Hospital Stay (HOSPITAL_COMMUNITY): Payer: 59

## 2016-09-14 DIAGNOSIS — F32A Depression, unspecified: Secondary | ICD-10-CM | POA: Insufficient documentation

## 2016-09-14 DIAGNOSIS — I480 Paroxysmal atrial fibrillation: Secondary | ICD-10-CM

## 2016-09-14 DIAGNOSIS — F329 Major depressive disorder, single episode, unspecified: Secondary | ICD-10-CM | POA: Insufficient documentation

## 2016-09-14 DIAGNOSIS — E042 Nontoxic multinodular goiter: Secondary | ICD-10-CM

## 2016-09-14 DIAGNOSIS — R7989 Other specified abnormal findings of blood chemistry: Secondary | ICD-10-CM | POA: Insufficient documentation

## 2016-09-14 LAB — BASIC METABOLIC PANEL
Anion gap: 13 (ref 5–15)
BUN: 26 mg/dL — ABNORMAL HIGH (ref 6–20)
CO2: 29 mmol/L (ref 22–32)
Calcium: 8.8 mg/dL — ABNORMAL LOW (ref 8.9–10.3)
Chloride: 91 mmol/L — ABNORMAL LOW (ref 101–111)
Creatinine, Ser: 1.28 mg/dL — ABNORMAL HIGH (ref 0.61–1.24)
GFR calc Af Amer: >60 mL/min (ref >60)
GFR calc non Af Amer: >60 mL/min (ref >60)
Glucose, Bld: 113 mg/dL — ABNORMAL HIGH (ref 65–99)
Potassium: 3.1 mmol/L — ABNORMAL LOW (ref 3.5–5.1)
Sodium: 133 mmol/L — ABNORMAL LOW (ref 135–145)

## 2016-09-14 LAB — CBC
HCT: 36.1 % — ABNORMAL LOW (ref 39.0–52.0)
Hemoglobin: 11.8 g/dL — ABNORMAL LOW (ref 13.0–17.0)
MCH: 28 pg (ref 26.0–34.0)
MCHC: 32.7 g/dL (ref 30.0–36.0)
MCV: 85.5 fL (ref 78.0–100.0)
Platelets: 219 10*3/uL (ref 150–400)
RBC: 4.22 MIL/uL (ref 4.22–5.81)
RDW: 13.6 % (ref 11.5–15.5)
WBC: 13.3 10*3/uL — ABNORMAL HIGH (ref 4.0–10.5)

## 2016-09-14 LAB — GLUCOSE, CAPILLARY
GLUCOSE-CAPILLARY: 100 mg/dL — AB (ref 65–99)
GLUCOSE-CAPILLARY: 181 mg/dL — AB (ref 65–99)
GLUCOSE-CAPILLARY: 201 mg/dL — AB (ref 65–99)
Glucose-Capillary: 112 mg/dL — ABNORMAL HIGH (ref 65–99)
Glucose-Capillary: 139 mg/dL — ABNORMAL HIGH (ref 65–99)
Glucose-Capillary: 174 mg/dL — ABNORMAL HIGH (ref 65–99)

## 2016-09-14 MED ORDER — AMIODARONE HCL IN DEXTROSE 360-4.14 MG/200ML-% IV SOLN
30.0000 mg/h | INTRAVENOUS | Status: DC
  Administered 2016-09-14: 30 mg/h via INTRAVENOUS

## 2016-09-14 MED ORDER — INSULIN ASPART 100 UNIT/ML ~~LOC~~ SOLN
0.0000 [IU] | Freq: Three times a day (TID) | SUBCUTANEOUS | Status: DC
  Administered 2016-09-14: 4 [IU] via SUBCUTANEOUS
  Administered 2016-09-15 (×2): 2 [IU] via SUBCUTANEOUS

## 2016-09-14 MED ORDER — AMIODARONE HCL IN DEXTROSE 360-4.14 MG/200ML-% IV SOLN: 60.0000 mg/h | INTRAVENOUS | Status: DC

## 2016-09-14 MED ORDER — POTASSIUM CHLORIDE CRYS ER 20 MEQ PO TBCR
40.0000 meq | EXTENDED_RELEASE_TABLET | Freq: Two times a day (BID) | ORAL | Status: DC
  Administered 2016-09-14 – 2016-09-15 (×5): 40 meq via ORAL
  Filled 2016-09-14 (×5): qty 2

## 2016-09-14 MED ORDER — INSULIN ASPART 100 UNIT/ML ~~LOC~~ SOLN: 0.0000 [IU] | SUBCUTANEOUS | Status: DC

## 2016-09-14 MED ORDER — AMIODARONE HCL 200 MG PO TABS: 400.0000 mg | ORAL_TABLET | Freq: Two times a day (BID) | ORAL | Status: DC

## 2016-09-14 MED ORDER — AMIODARONE HCL 200 MG PO TABS: 400.0000 mg | ORAL_TABLET | Freq: Every day | ORAL | Status: DC

## 2016-09-14 MED ORDER — AMIODARONE HCL 200 MG PO TABS
400.0000 mg | ORAL_TABLET | Freq: Two times a day (BID) | ORAL | Status: DC
  Administered 2016-09-14 – 2016-09-16 (×6): 400 mg via ORAL
  Filled 2016-09-14 (×7): qty 2

## 2016-09-14 NOTE — Progress Notes (Signed)
  Amiodarone Drug - Drug Interaction Consult Note  Recommendations: None, monitor for now.  Amiodarone is metabolized by the cytochrome P450 system and therefore has the potential to cause many drug interactions. Amiodarone has an average plasma half-life of 50 days (range 20 to 100 days).   There is potential for drug interactions to occur several weeks or months after stopping treatment and the onset of drug interactions may be slow after initiating amiodarone.   [x]  Statins: Increased risk of myopathy. Simvastatin- restrict dose to 20mg  daily. Other statins: counsel patients to report any muscle pain or weakness immediately.  []  Anticoagulants: Amiodarone can increase anticoagulant effect. Consider warfarin dose reduction. Patients should be monitored closely and the dose of anticoagulant altered accordingly, remembering that amiodarone levels take several weeks to stabilize.  []  Antiepileptics: Amiodarone can increase plasma concentration of phenytoin, the dose should be reduced. Note that small changes in phenytoin dose can result in large changes in levels. Monitor patient and counsel on signs of toxicity.  [x]  Beta blockers: increased risk of bradycardia, AV block and myocardial depression. Sotalol - avoid concomitant use.  [x]   Calcium channel blockers (diltiazem and verapamil): increased risk of bradycardia, AV block and myocardial depression.  []   Cyclosporine: Amiodarone increases levels of cyclosporine. Reduced dose of cyclosporine is recommended.  []  Digoxin dose should be halved when amiodarone is started.  [x]  Diuretics: increased risk of cardiotoxicity if hypokalemia occurs.  []  Oral hypoglycemic agents (glyburide, glipizide, glimepiride): increased risk of hypoglycemia. Patient's glucose levels should be monitored closely when initiating amiodarone therapy.   []  Drugs that prolong the QT interval:  Torsades de pointes risk may be increased with concurrent use - avoid if  possible.  Monitor QTc, also keep magnesium/potassium WNL if concurrent therapy can't be avoided. Marland Kitchen Antibiotics: e.g. fluoroquinolones, erythromycin. . Antiarrhythmics: e.g. quinidine, procainamide, disopyramide, sotalol. . Antipsychotics: e.g. phenothiazines, haloperidol.  . Lithium, tricyclic antidepressants, and methadone. Thank You,   Uvaldo Rising, BCPS  Clinical Pharmacist Pager [phone]  09/14/2016 8:46 AM

## 2016-09-14 NOTE — Progress Notes (Signed)
Patient ID: [name], male   DOB: 09-Jun-1965, 51 y.o.   MRN: [id number] TCTS DAILY ICU PROGRESS NOTE                   [address]            [place] [address]          [phone]   5 Days Post-Op Procedure(s) (LRB): CORONARY ARTERY BYPASS GRAFTING times three  using left internal mammary artery and right saphenous vein harvest (N/A) TRANSESOPHAGEAL ECHOCARDIOGRAM (TEE) (N/A) VIDEO BRONCHOSCOPY (N/A)  Total Length of Stay:  LOS: 12 days   Subjective: Status post CABG Heavy smoking history HIV positive history Poorly controlled diabetes history  Patient had  acute respiratory failure requiring high levels of oxygen immediately after surgery. The patient had heavy secretions with atelectasis of the right upper lobe and required bronchoscopy in the operating room at the end of his CABG procedure.   Patient developed rapid atrial fibrillation two nights ago night now on amiodarone and IV Cardizem with heart rate of 90-100 in sinus   Objective: Vital signs in last 24 hours: Temp:  [97.7 F (36.5 C)-99 F (37.2 C)] 97.7 F (36.5 C) (05/26 0724) Pulse Rate:  [85-121] 100 (05/26 0500) Cardiac Rhythm: Atrial fibrillation (05/26 0500) Resp:  [15-25] 24 (05/26 0500) BP: (95-125)/(53-96) 108/68 (05/26 0500) SpO2:  [90 %-100 %] 93 % (05/26 0500) Weight:  [270 lb 6.4 oz (122.7 kg)] 270 lb 6.4 oz (122.7 kg) (05/26 0500)  Filed Weights   09/12/16 0600 09/13/16 0600 09/14/16 0500  Weight: 275 lb 5.7 oz (124.9 kg) 270 lb 8.1 oz (122.7 kg) 270 lb 6.4 oz (122.7 kg)    Weight change: -1.7 oz (-0.047 kg)   Hemodynamic parameters for last 24 hours:    Intake/Output from previous day: 05/25 0701 - 05/26 0700 In: 1828.9 [P.O.:1070; I.V.:608.9; IV Piggyback:150] Out: 2675 [Urine:2675]  Intake/Output this shift: No intake/output data recorded.  Current Meds: Scheduled Meds: . acetaminophen  1,000 mg Oral Q6H   Or  . acetaminophen (TYLENOL) oral liquid 160 mg/5 mL   1,000 mg Per Tube Q6H  . aspirin EC  325 mg Oral Daily   Or  . aspirin  324 mg Per Tube Daily  . bisacodyl  10 mg Oral Daily   Or  . bisacodyl  10 mg Rectal Daily  . budesonide (PULMICORT) nebulizer solution  0.5 mg Nebulization BID  . docusate sodium  200 mg Oral Daily  . dolutegravir  50 mg Oral Daily  . emtricitabine-tenofovir AF  1 tablet Oral Daily  . enoxaparin (LOVENOX) injection  40 mg Subcutaneous Daily  . furosemide  40 mg Intravenous BID  . guaiFENesin  600 mg Oral BID  . insulin aspart  0-24 Units Subcutaneous Q4H  . insulin detemir  28 Units Subcutaneous BID  . levalbuterol  1.25 mg Nebulization TID  . metoCLOPramide (REGLAN) injection  10 mg Intravenous Q6H  . metolazone  10 mg Oral QAC breakfast  . metoprolol tartrate  25 mg Oral BID  . pantoprazole  40 mg Oral Daily  . potassium chloride  40 mEq Oral BID  . pravastatin  40 mg Oral QPM  . sertraline  50 mg Oral Daily  . sodium chloride flush  3 mL Intravenous Q12H   Continuous Infusions: . sodium chloride Stopped (09/10/16 0900)  . sodium chloride Stopped (09/10/16 0800)  . sodium chloride Stopped (09/09/16 2000)  . amiodarone 30 mg/hr (09/14/16  8811)  . cefTAZidime (FORTAZ)  IV Stopped (09/14/16 0559)  . diltiazem (CARDIZEM) infusion 10 mg/hr (09/14/16 0057)  . lactated ringers 10 mL/hr at 09/12/16 0100  . lactated ringers     PRN Meds:.sodium chloride, metoprolol tartrate, ondansetron (ZOFRAN) IV, oxyCODONE, sodium chloride flush, traMADol  General appearance: alert, cooperative and no distress Neurologic: intact Heart: regular rate and rhythm, S1, S2 normal, no murmur, click, rub or gallop Lungs: diminished breath sounds bibasilar Abdomen: soft, non-tender; bowel sounds normal; no masses,  no organomegaly Extremities: extremities normal, atraumatic, no cyanosis or edema and Homans sign is negative, no sign of DVT Wound: intact  Lab Results: CBC:  Recent Labs  09/13/16 0313 09/14/16 0203  WBC  14.3* 13.3*  HGB 12.4* 11.8*  HCT 37.6* 36.1*  PLT 182 219   BMET:   Recent Labs  09/13/16 1506 09/14/16 0203  NA 132* 133*  K 3.1* 3.1*  CL 93* 91*  CO2 27 29  GLUCOSE 205* 113*  BUN 26* 26*  CREATININE 1.33* 1.28*  CALCIUM 8.6* 8.8*    CMET: Lab Results  Component Value Date   WBC 13.3 (H) 09/14/2016   HGB 11.8 (L) 09/14/2016   HCT 36.1 (L) 09/14/2016   PLT 219 09/14/2016   GLUCOSE 113 (H) 09/14/2016   CHOL 116 09/08/2016   TRIG 189 (H) 09/08/2016   HDL 28 (L) 09/08/2016   LDLDIRECT 74.0 04/12/2011   LDLCALC 50 09/08/2016   ALT 36 09/13/2016   AST 31 09/13/2016   NA 133 (L) 09/14/2016   K 3.1 (L) 09/14/2016   CL 91 (L) 09/14/2016   CREATININE 1.28 (H) 09/14/2016   BUN 26 (H) 09/14/2016   CO2 29 09/14/2016   TSH 4.584 (H) 09/06/2016   INR 1.16 09/09/2016   HGBA1C 10.6 (H) 09/06/2016      PT/INR: No results for input(s): LABPROT, INR in the last 72 hours. Radiology: Dg Chest Port 1 View  Result Date: 09/14/2016 CLINICAL DATA:  Status post CABG. EXAM: PORTABLE CHEST 1 VIEW COMPARISON:  09/13/2016 FINDINGS: Sequelae of CABG are again identified. Cardiac silhouette remains enlarged. Pulmonary vascular congestion is mildly increased. No overt pulmonary edema or lobar consolidation is seen. No sizable pleural effusion or pneumothorax is identified. IMPRESSION: Cardiomegaly with mildly increased vascular congestion. Electronically Signed   By: Logan Bores M.D.   On: 09/14/2016 07:21    Assessment/Plan: S/P Procedure(s) (LRB): CORONARY ARTERY BYPASS GRAFTING times three  using left internal mammary artery and right saphenous vein harvest (N/A) TRANSESOPHAGEAL ECHOCARDIOGRAM (TEE) (N/A) VIDEO BRONCHOSCOPY (N/A) Mobilize Diuresis  Holding sinus now on iv Cardizem and amino tsh mildly elevated and has thyroid nodules that will need bx as outpatient  Convert to po aminiodrone, then po cardizem Replace k     [name] 09/14/2016 7:44 AM

## 2016-09-14 NOTE — Progress Notes (Signed)
Patient ID: [name], male   DOB: 07-28-1965, 51 y.o.   MRN: [id number] EVENING ROUNDS NOTE :     [address]       [place] [address]             [phone]                 5 Days Post-Op Procedure(s) (LRB): CORONARY ARTERY BYPASS GRAFTING times three  using left internal mammary artery and right saphenous vein harvest (N/A) TRANSESOPHAGEAL ECHOCARDIOGRAM (TEE) (N/A) VIDEO BRONCHOSCOPY (N/A)  Total Length of Stay:  LOS: 12 days  BP 121/88   Pulse 94   Temp 98 F (36.7 C) (Oral)   Resp 16   Ht 5\' 9"  (1.753 m)   Wt 270 lb 6.4 oz (122.7 kg)   SpO2 91%   BMI 39.93 kg/m   .Intake/Output      05/26 0701 - 05/27 0700   P.O. 480   I.V. (mL/kg) 277 (2.3)   IV Piggyback 50   Total Intake(mL/kg) 807 (6.6)   Urine (mL/kg/hr) 1350 (0.9)   Total Output 1350   Net -543         . sodium chloride Stopped (09/10/16 0900)  . sodium chloride Stopped (09/10/16 0800)  . sodium chloride Stopped (09/09/16 2000)  . amiodarone 30 mg/hr (09/14/16 0318)  . amiodarone Stopped (09/14/16 1700)  . cefTAZidime (FORTAZ)  IV Stopped (09/14/16 1323)  . diltiazem (CARDIZEM) infusion 10 mg/hr (09/14/16 1100)  . lactated ringers 10 mL/hr at 09/12/16 0100  . lactated ringers       Lab Results  Component Value Date   WBC 13.3 (H) 09/14/2016   HGB 11.8 (L) 09/14/2016   HCT 36.1 (L) 09/14/2016   PLT 219 09/14/2016   GLUCOSE 113 (H) 09/14/2016   CHOL 116 09/08/2016   TRIG 189 (H) 09/08/2016   HDL 28 (L) 09/08/2016   LDLDIRECT 74.0 04/12/2011   LDLCALC 50 09/08/2016   ALT 36 09/13/2016   AST 31 09/13/2016   NA 133 (L) 09/14/2016   K 3.1 (L) 09/14/2016   CL 91 (L) 09/14/2016   CREATININE 1.28 (H) 09/14/2016   BUN 26 (H) 09/14/2016   CO2 29 09/14/2016   TSH 4.584 (H) 09/06/2016   INR 1.16 09/09/2016   HGBA1C 10.6 (H) 09/06/2016   Back in afib controlled rate 80, on po Cordarone  and iv Cardizem   [name] MD  Beeper [phone] Office [phone] 09/14/2016  7:41 PM

## 2016-09-14 NOTE — Progress Notes (Signed)
Progress Note  Patient Name: [name] Date of Encounter: 09/14/2016  Primary Cardiologist: Darlina Guys  Subjective   Feels well. No difficulty with breathing. Appears somewhat sore but sitting at bedside without difficulty.  Inpatient Medications    Scheduled Meds: . acetaminophen  1,000 mg Oral Q6H   Or  . acetaminophen (TYLENOL) oral liquid 160 mg/5 mL  1,000 mg Per Tube Q6H  . amiodarone  400 mg Oral Q12H   Followed by  . [START ON 09/22/2016] amiodarone  400 mg Oral Daily  . aspirin EC  325 mg Oral Daily   Or  . aspirin  324 mg Per Tube Daily  . bisacodyl  10 mg Oral Daily   Or  . bisacodyl  10 mg Rectal Daily  . budesonide (PULMICORT) nebulizer solution  0.5 mg Nebulization BID  . docusate sodium  200 mg Oral Daily  . dolutegravir  50 mg Oral Daily  . emtricitabine-tenofovir AF  1 tablet Oral Daily  . enoxaparin (LOVENOX) injection  40 mg Subcutaneous Daily  . furosemide  40 mg Intravenous BID  . guaiFENesin  600 mg Oral BID  . insulin aspart  0-24 Units Subcutaneous Q4H  . insulin detemir  28 Units Subcutaneous BID  . levalbuterol  1.25 mg Nebulization TID  . metoCLOPramide (REGLAN) injection  10 mg Intravenous Q6H  . metolazone  10 mg Oral QAC breakfast  . metoprolol tartrate  25 mg Oral BID  . pantoprazole  40 mg Oral Daily  . potassium chloride  40 mEq Oral BID  . pravastatin  40 mg Oral QPM  . sertraline  50 mg Oral Daily  . sodium chloride flush  3 mL Intravenous Q12H   Continuous Infusions: . sodium chloride Stopped (09/10/16 0900)  . sodium chloride Stopped (09/10/16 0800)  . sodium chloride Stopped (09/09/16 2000)  . amiodarone 30 mg/hr (09/14/16 0318)  . amiodarone     Followed by  . amiodarone    . cefTAZidime (FORTAZ)  IV Stopped (09/14/16 0559)  . diltiazem (CARDIZEM) infusion 10 mg/hr (09/14/16 0057)  . lactated ringers 10 mL/hr at 09/12/16 0100  . lactated ringers     PRN Meds: sodium chloride, metoprolol tartrate, ondansetron  (ZOFRAN) IV, oxyCODONE, sodium chloride flush, traMADol   Vital Signs    Vitals:   09/14/16 0400 09/14/16 0500 09/14/16 0724 09/14/16 0800  BP: (!) 119/53 108/68  113/71  Pulse: 92 100  87  Resp: (!) 23 (!) 24  19  Temp:   97.7 F (36.5 C)   TempSrc:   Oral   SpO2: 93% 93%  96%  Weight:  270 lb 6.4 oz (122.7 kg)    Height:        Intake/Output Summary (Last 24 hours) at 09/14/16 0901 Last data filed at 09/14/16 0800  Gross per 24 hour  Intake           1817.2 ml  Output             2675 ml  Net           -857.8 ml   Filed Weights   09/12/16 0600 09/13/16 0600 09/14/16 0500  Weight: 275 lb 5.7 oz (124.9 kg) 270 lb 8.1 oz (122.7 kg) 270 lb 6.4 oz (122.7 kg)    Telemetry    Normal sinus rhythm - Personally Reviewed  ECG    Not performed today - Personally Reviewed  Physical Exam  Good skin color. No distress. GEN: No acute distress.  Neck: No JVD Cardiac: RRR, no murmurs, rubs, or gallops.  Respiratory: Clear to auscultation bilaterally. GI: Soft, nontender, non-distended  MS: No edema; No deformity. Neuro:  Nonfocal  Psych: Normal affect   Labs    Chemistry Recent Labs Lab 09/12/16 0407  09/13/16 0313 09/13/16 1506 09/14/16 0203  NA 136  < > 135 132* 133*  K 3.3*  < > 3.5 3.1* 3.1*  CL 98*  < > 93* 93* 91*  CO2 29  < > 32 27 29  GLUCOSE 141*  < > 126* 205* 113*  BUN 15  < > 24* 26* 26*  CREATININE 1.14  < > 1.43* 1.33* 1.28*  CALCIUM 8.5*  < > 8.8* 8.6* 8.8*  PROT 5.8*  --  6.1*  --   --   ALBUMIN 2.9*  --  2.9*  --   --   AST 28  --  31  --   --   ALT 35  --  36  --   --   ALKPHOS 68  --  107  --   --   BILITOT 0.8  --  0.6  --   --   GFRNONAA >60  < > 55* 60* >60  GFRAA >60  < > >60 >60 >60  ANIONGAP 9  < > 10 12 13   < > = values in this interval not displayed.   Hematology Recent Labs Lab 09/12/16 0407 09/13/16 0313 09/14/16 0203  WBC 16.5* 14.3* 13.3*  RBC 4.26 4.36 4.22  HGB 11.9* 12.4* 11.8*  HCT 37.3* 37.6* 36.1*  MCV 87.6  86.2 85.5  MCH 27.9 28.4 28.0  MCHC 31.9 33.0 32.7  RDW 13.9 13.6 13.6  PLT 164 182 219    Cardiac EnzymesNo results for input(s): TROPONINI in the last 168 hours. No results for input(s): TROPIPOC in the last 168 hours.   BNPNo results for input(s): BNP, PROBNP in the last 168 hours.   DDimer No results for input(s): DDIMER in the last 168 hours.   Radiology    Dg Chest Port 1 View  Result Date: 09/14/2016 CLINICAL DATA:  Status post CABG. EXAM: PORTABLE CHEST 1 VIEW COMPARISON:  09/13/2016 FINDINGS: Sequelae of CABG are again identified. Cardiac silhouette remains enlarged. Pulmonary vascular congestion is mildly increased. No overt pulmonary edema or lobar consolidation is seen. No sizable pleural effusion or pneumothorax is identified. IMPRESSION: Cardiomegaly with mildly increased vascular congestion. Electronically Signed   By: Logan Bores M.D.   On: 09/14/2016 07:21   Dg Chest Port 1 View  Result Date: 09/13/2016 CLINICAL DATA:  Shortness of Breath EXAM: PORTABLE CHEST 1 VIEW COMPARISON:  09/01/2016 FINDINGS: Cardiomegaly. Mild vascular congestion. No confluent airspace opacities, effusions or edema. No acute bony abnormality. IMPRESSION: Cardiomegaly with vascular congestion.  No active disease. Electronically Signed   By: Rolm Baptise M.D.   On: 09/13/2016 07:36    Cardiac Studies   No new cardiac studies  Patient Profile     51 y.o. male with PMH of CAD s/p DES Lcx (2012), HIV, HL, tobacco abuse, OSA, UC, and DM who presented with chest pain. Cath demonstrated multivessel CAD leading to CABG with LIMA to LAD, SVG to PL and SVG to OM on 09/09/16.Marland Kitchen Postop A. fib, currently on IV amiodarone and then normal sinus rhythm.  Assessment & Plan    1. Presentation with non-ST elevation myocardial infarction with demonstrated multivessel coronary disease a cath, requiring surgical intervention. 2. Stable status post multivessel CABG without  recurrent ischemic symptoms or evidence of  heart failure. 3. Postoperative atrial fibrillation, currently in normal sinus rhythm on IV amiodarone. The current recommendation would be to continue amiodarone into the outpatient setting and discontinue the medication when 4-8 weeks out from surgery. Continue loading with amiodarone. If recurrent or prolonged atrial fibrillation this admission, will require anticoagulation therapy. 4. Amiodarone therapy, will be monitored and followed closely by cardiology.  Signed, Sinclair Grooms, MD  09/14/2016, 9:01 AM

## 2016-09-15 ENCOUNTER — Inpatient Hospital Stay (HOSPITAL_COMMUNITY): Payer: 59

## 2016-09-15 DIAGNOSIS — N179 Acute kidney failure, unspecified: Secondary | ICD-10-CM

## 2016-09-15 DIAGNOSIS — I484 Atypical atrial flutter: Secondary | ICD-10-CM

## 2016-09-15 DIAGNOSIS — E876 Hypokalemia: Secondary | ICD-10-CM

## 2016-09-15 DIAGNOSIS — I2510 Atherosclerotic heart disease of native coronary artery without angina pectoris: Secondary | ICD-10-CM

## 2016-09-15 LAB — BASIC METABOLIC PANEL
ANION GAP: 9 (ref 5–15)
Anion gap: 12 (ref 5–15)
BUN: 33 mg/dL — ABNORMAL HIGH (ref 6–20)
BUN: 33 mg/dL — ABNORMAL HIGH (ref 6–20)
CALCIUM: 8.7 mg/dL — AB (ref 8.9–10.3)
CO2: 31 mmol/L (ref 22–32)
CO2: 34 mmol/L — ABNORMAL HIGH (ref 22–32)
Calcium: 8.5 mg/dL — ABNORMAL LOW (ref 8.9–10.3)
Chloride: 88 mmol/L — ABNORMAL LOW (ref 101–111)
Chloride: 86 mmol/L — ABNORMAL LOW (ref 101–111)
Creatinine, Ser: 1.53 mg/dL — ABNORMAL HIGH (ref 0.61–1.24)
Creatinine, Ser: 1.64 mg/dL — ABNORMAL HIGH (ref 0.61–1.24)
GFR calc Af Amer: 58 mL/min — ABNORMAL LOW (ref >60)
GFR calc non Af Amer: 50 mL/min — ABNORMAL LOW (ref >60)
GFR, EST AFRICAN AMERICAN: 54 mL/min — AB (ref >60)
GFR, EST NON AFRICAN AMERICAN: 46 mL/min — AB (ref >60)
Glucose, Bld: 101 mg/dL — ABNORMAL HIGH (ref 65–99)
Glucose, Bld: 176 mg/dL — ABNORMAL HIGH (ref 65–99)
Potassium: 2.8 mmol/L — ABNORMAL LOW (ref 3.5–5.1)
Potassium: 3.1 mmol/L — ABNORMAL LOW (ref 3.5–5.1)
SODIUM: 129 mmol/L — AB (ref 135–145)
Sodium: 131 mmol/L — ABNORMAL LOW (ref 135–145)

## 2016-09-15 LAB — CBC
HCT: 34.5 % — ABNORMAL LOW (ref 39.0–52.0)
Hemoglobin: 11.3 g/dL — ABNORMAL LOW (ref 13.0–17.0)
MCH: 27.8 pg (ref 26.0–34.0)
MCHC: 32.8 g/dL (ref 30.0–36.0)
MCV: 84.8 fL (ref 78.0–100.0)
Platelets: 240 10*3/uL (ref 150–400)
RBC: 4.07 MIL/uL — ABNORMAL LOW (ref 4.22–5.81)
RDW: 13.3 % (ref 11.5–15.5)
WBC: 11.9 10*3/uL — ABNORMAL HIGH (ref 4.0–10.5)

## 2016-09-15 LAB — GLUCOSE, CAPILLARY
GLUCOSE-CAPILLARY: 107 mg/dL — AB (ref 65–99)
GLUCOSE-CAPILLARY: 133 mg/dL — AB (ref 65–99)
Glucose-Capillary: 115 mg/dL — ABNORMAL HIGH (ref 65–99)
Glucose-Capillary: 157 mg/dL — ABNORMAL HIGH (ref 65–99)

## 2016-09-15 MED ORDER — FUROSEMIDE 10 MG/ML IJ SOLN: 40.0000 mg | Freq: Every day | INTRAMUSCULAR | Status: DC

## 2016-09-15 MED ORDER — POTASSIUM CHLORIDE 10 MEQ/50ML IV SOLN: 10.0000 meq | Freq: Once | INTRAVENOUS | Status: DC

## 2016-09-15 MED ORDER — POTASSIUM CHLORIDE CRYS ER 20 MEQ PO TBCR
40.0000 meq | EXTENDED_RELEASE_TABLET | Freq: Once | ORAL | Status: AC
  Administered 2016-09-15: 40 meq via ORAL
  Filled 2016-09-15: qty 2

## 2016-09-15 MED ORDER — LEVALBUTEROL HCL 1.25 MG/0.5ML IN NEBU
1.2500 mg | INHALATION_SOLUTION | Freq: Two times a day (BID) | RESPIRATORY_TRACT | Status: DC
  Administered 2016-09-16 – 2016-09-19 (×7): 1.25 mg via RESPIRATORY_TRACT
  Filled 2016-09-15 (×7): qty 0.5

## 2016-09-15 MED ORDER — POTASSIUM CHLORIDE CRYS ER 20 MEQ PO TBCR: 20.0000 meq | EXTENDED_RELEASE_TABLET | ORAL | Status: DC | PRN

## 2016-09-15 MED ORDER — ENOXAPARIN SODIUM 30 MG/0.3ML ~~LOC~~ SOLN
30.0000 mg | Freq: Every day | SUBCUTANEOUS | Status: DC
  Administered 2016-09-16 – 2016-09-18 (×3): 30 mg via SUBCUTANEOUS
  Filled 2016-09-15 (×3): qty 0.3

## 2016-09-15 MED ORDER — DILTIAZEM HCL 100 MG IV SOLR
10.0000 mg/h | INTRAVENOUS | Status: DC
  Administered 2016-09-15: 10 mg/h via INTRAVENOUS
  Filled 2016-09-15: qty 100

## 2016-09-15 NOTE — Progress Notes (Signed)
Patient ID: [name], male   DOB: 11-16-1965, 51 y.o.   MRN: [id number] EVENING ROUNDS NOTE :     [address]       [place] [address]             [phone]                 6 Days Post-Op Procedure(s) (LRB): CORONARY ARTERY BYPASS GRAFTING times three  using left internal mammary artery and right saphenous vein harvest (N/A) TRANSESOPHAGEAL ECHOCARDIOGRAM (TEE) (N/A) VIDEO BRONCHOSCOPY (N/A)  Total Length of Stay:  LOS: 13 days  BP 96/62   Pulse 82   Temp 97.2 F (36.2 C) (Oral)   Resp 18   Ht 5\' 9"  (1.753 m)   Wt 267 lb 3.2 oz (121.2 kg)   SpO2 93%   BMI 39.46 kg/m   .Intake/Output      05/27 0701 - 05/28 0700   P.O. 480   I.V. (mL/kg) 20 (0.2)   IV Piggyback 50   Total Intake(mL/kg) 550 (4.5)   Urine (mL/kg/hr) 1050 (0.7)   Emesis/NG output 0 (0)   Total Output 1050   Net -500       Urine Occurrence 1 x   Emesis Occurrence 1 x     . sodium chloride Stopped (09/10/16 0900)  . sodium chloride Stopped (09/10/16 0800)  . sodium chloride Stopped (09/09/16 2000)  . amiodarone 30 mg/hr (09/14/16 0318)  . amiodarone Stopped (09/14/16 1700)  . cefTAZidime (FORTAZ)  IV Stopped (09/15/16 1357)  . lactated ringers 10 mL/hr at 09/12/16 0100  . lactated ringers    . potassium chloride       Lab Results  Component Value Date   WBC 11.9 (H) 09/15/2016   HGB 11.3 (L) 09/15/2016   HCT 34.5 (L) 09/15/2016   PLT 240 09/15/2016   GLUCOSE 176 (H) 09/15/2016   CHOL 116 09/08/2016   TRIG 189 (H) 09/08/2016   HDL 28 (L) 09/08/2016   LDLDIRECT 74.0 04/12/2011   LDLCALC 50 09/08/2016   ALT 36 09/13/2016   AST 31 09/13/2016   NA 129 (L) 09/15/2016   K 3.1 (L) 09/15/2016   CL 86 (L) 09/15/2016   CREATININE 1.64 (H) 09/15/2016   BUN 33 (H) 09/15/2016   CO2 34 (H) 09/15/2016   TSH 4.584 (H) 09/06/2016   INR 1.16 09/09/2016   HGBA1C 10.6 (H) 09/06/2016   Back in sinus , with frequant pac's walking well Replacing kcl   [name]  MD  Beeper [phone] Office [phone] 09/15/2016 7:24 PM

## 2016-09-15 NOTE — Progress Notes (Addendum)
Progress Note  Patient Name: [name] Date of Encounter: 09/15/2016  Primary Cardiologist: Darlina Guys  Subjective   No complaints. Mild musculoskeletal chest discomfort.   Inpatient Medications    Scheduled Meds: . amiodarone  400 mg Oral Q12H   Followed by  . [START ON 09/21/2016] amiodarone  400 mg Oral Daily  . aspirin EC  325 mg Oral Daily   Or  . aspirin  324 mg Per Tube Daily  . bisacodyl  10 mg Oral Daily   Or  . bisacodyl  10 mg Rectal Daily  . budesonide (PULMICORT) nebulizer solution  0.5 mg Nebulization BID  . docusate sodium  200 mg Oral Daily  . dolutegravir  50 mg Oral Daily  . emtricitabine-tenofovir AF  1 tablet Oral Daily  . enoxaparin (LOVENOX) injection  40 mg Subcutaneous Daily  . furosemide  40 mg Intravenous BID  . guaiFENesin  600 mg Oral BID  . insulin aspart  0-24 Units Subcutaneous TID AC & HS  . insulin detemir  28 Units Subcutaneous BID  . levalbuterol  1.25 mg Nebulization TID  . metolazone  10 mg Oral QAC breakfast  . metoprolol tartrate  25 mg Oral BID  . pantoprazole  40 mg Oral Daily  . potassium chloride  40 mEq Oral BID  . pravastatin  40 mg Oral QPM  . sertraline  50 mg Oral Daily  . sodium chloride flush  3 mL Intravenous Q12H   Continuous Infusions: . sodium chloride Stopped (09/10/16 0900)  . sodium chloride Stopped (09/10/16 0800)  . sodium chloride Stopped (09/09/16 2000)  . amiodarone 30 mg/hr (09/14/16 0318)  . amiodarone Stopped (09/14/16 1700)  . cefTAZidime (FORTAZ)  IV Stopped (09/15/16 0530)  . lactated ringers 10 mL/hr at 09/12/16 0100  . lactated ringers     PRN Meds: sodium chloride, metoprolol tartrate, ondansetron (ZOFRAN) IV, oxyCODONE, sodium chloride flush, traMADol   Vital Signs    Vitals:   09/15/16 0800 09/15/16 0900 09/15/16 0915 09/15/16 0930  BP: 104/85 117/68    Pulse: 93 93 (!) 106 (!) 138  Resp: 17 (!) 21 (!) 23 (!) 21  Temp:      TempSrc:      SpO2: 96% 96% (!) 88% (!) 89%    Weight:      Height:        Intake/Output Summary (Last 24 hours) at 09/15/16 1001 Last data filed at 09/15/16 0900  Gross per 24 hour  Intake           1696.9 ml  Output             2330 ml  Net           -633.1 ml   Filed Weights   09/13/16 0600 09/14/16 0500 09/15/16 0600  Weight: 270 lb 8.1 oz (122.7 kg) 270 lb 6.4 oz (122.7 kg) 267 lb 3.2 oz (121.2 kg)    Telemetry    Currently in atrial flutter with predominantly 2-1 AV conduction and ventricular response 149 bpm. - Personally Reviewed  ECG    A 12-lead was not repeated - Personally Reviewed  Physical Exam  Awakened from sleep. Was resting comfortably, snoring. GEN: No acute distress.   Neck: No JVD Cardiac: RRR, no murmurs, or gallops.  Respiratory: Clear to auscultation bilaterally. GI: Soft, nontender, non-distended  MS: No edema; No deformity. Neuro:  Nonfocal  Psych: Normal affect   Labs    Chemistry Recent Labs Lab 09/12/16 0407  09/13/16 0313 09/13/16 1506 09/14/16 0203 09/15/16 0206  NA 136  < > 135 132* 133* 131*  K 3.3*  < > 3.5 3.1* 3.1* 2.8*  CL 98*  < > 93* 93* 91* 88*  CO2 29  < > 32 27 29 31   GLUCOSE 141*  < > 126* 205* 113* 101*  BUN 15  < > 24* 26* 26* 33*  CREATININE 1.14  < > 1.43* 1.33* 1.28* 1.53*  CALCIUM 8.5*  < > 8.8* 8.6* 8.8* 8.5*  PROT 5.8*  --  6.1*  --   --   --   ALBUMIN 2.9*  --  2.9*  --   --   --   AST 28  --  31  --   --   --   ALT 35  --  36  --   --   --   ALKPHOS 68  --  107  --   --   --   BILITOT 0.8  --  0.6  --   --   --   GFRNONAA >60  < > 55* 60* >60 50*  GFRAA >60  < > >60 >60 >60 58*  ANIONGAP 9  < > 10 12 13 12   < > = values in this interval not displayed.   Hematology Recent Labs Lab 09/13/16 0313 09/14/16 0203 09/15/16 0206  WBC 14.3* 13.3* 11.9*  RBC 4.36 4.22 4.07*  HGB 12.4* 11.8* 11.3*  HCT 37.6* 36.1* 34.5*  MCV 86.2 85.5 84.8  MCH 28.4 28.0 27.8  MCHC 33.0 32.7 32.8  RDW 13.6 13.6 13.3  PLT 182 219 240    Cardiac EnzymesNo  results for input(s): TROPONINI in the last 168 hours. No results for input(s): TROPIPOC in the last 168 hours.   BNPNo results for input(s): BNP, PROBNP in the last 168 hours.   DDimer No results for input(s): DDIMER in the last 168 hours.   Radiology    Dg Chest Port 1 View  Result Date: 09/15/2016 CLINICAL DATA:  Status post CABG. EXAM: PORTABLE CHEST 1 VIEW COMPARISON:  09/14/2016. FINDINGS: Cardiomegaly. Sequelae of prior CABG. Mild vascular congestion. No active infiltrates or failure. IMPRESSION: Improved aeration.  Cardiomegaly with mild vascular congestion. Electronically Signed   By: Staci Righter M.D.   On: 09/15/2016 07:23   Dg Chest Port 1 View  Result Date: 09/14/2016 CLINICAL DATA:  Status post CABG. EXAM: PORTABLE CHEST 1 VIEW COMPARISON:  09/13/2016 FINDINGS: Sequelae of CABG are again identified. Cardiac silhouette remains enlarged. Pulmonary vascular congestion is mildly increased. No overt pulmonary edema or lobar consolidation is seen. No sizable pleural effusion or pneumothorax is identified. IMPRESSION: Cardiomegaly with mildly increased vascular congestion. Electronically Signed   By: Logan Bores M.D.   On: 09/14/2016 07:21    Cardiac Studies   No new data  Patient Profile     51 y.o. male with PMH of CAD s/p DES Lcx (2012), HIV, HL, tobacco abuse, OSA, UC, and DM who presented with chest pain. Cath demonstrated multivessel CAD leading to CABG with LIMA to LAD, SVG to PL and SVG to OM on 09/09/16.Marland Kitchen Postop A. fib, currently on IV amiodarone and then normal sinus rhythm.   Assessment & Plan    1. Hypokalemia needs to be aggressively repleted since we are using amiodarone. Not sure why he gets daily metolazone but will DC for now until K repleted. 2. Multivessel coronary disease with successful surgical revascularization. EKGs show no evidence of ischemia  or procedure related infarction. 3. Postoperative atrial fibrillation in the setting of probable pericardial  inflammation. Plan amiodarone therapy. Diltiazem to control rate if needed. 4. Anticoagulation may be required if the burden of atrial fibrillation/flutter continuous over the next 24 hours. 5. Acute on chronic kidney injury. Hold metolazone for now    Signed, Sinclair Grooms, MD  09/15/2016, 10:01 AM

## 2016-09-15 NOTE — Progress Notes (Addendum)
Patient ID: [name], male   DOB: Aug 04, 1965, 51 y.o.   MRN: [id number] T[facility] DAILY ICU PROGRESS NOTE                   [place]            [place] [address]          [phone]   6 Days Post-Op Procedure(s) (LRB): CORONARY ARTERY BYPASS GRAFTING times three  using left internal mammary artery and right saphenous vein harvest (N/A) TRANSESOPHAGEAL ECHOCARDIOGRAM (TEE) (N/A) VIDEO BRONCHOSCOPY (N/A)  Total Length of Stay:  LOS: 13 days   Subjective: Status post CABG Heavy smoking history HIV positive history Poorly controlled diabetes history  Patient had  acute respiratory failure requiring high levels of oxygen immediately after surgery. The patient had heavy secretions with atelectasis of the right upper lobe and required bronchoscopy in the operating room at the end of his CABG procedure. Respiratory status has improved   Patient developed rapid atrial fibrillation three  nights ago night  Converted onamiodarone and IV Cardizem  Last night had some afib controlled rate, still on po amiodarone and IV Cardizem   Objective: Vital signs in last 24 hours: Temp:  [97.6 F (36.4 C)-98 F (36.7 C)] 97.6 F (36.4 C) (05/27 0736) Pulse Rate:  [68-104] 102 (05/27 0700) Cardiac Rhythm: Atrial fibrillation (05/27 0400) Resp:  [7-28] 20 (05/27 0700) BP: (93-127)/(53-88) 107/72 (05/27 0700) SpO2:  [88 %-97 %] 95 % (05/27 0700) Weight:  [267 lb 3.2 oz (121.2 kg)] 267 lb 3.2 oz (121.2 kg) (05/27 0600)  Filed Weights   09/13/16 0600 09/14/16 0500 09/15/16 0600  Weight: 270 lb 8.1 oz (122.7 kg) 270 lb 6.4 oz (122.7 kg) 267 lb 3.2 oz (121.2 kg)    Weight change: -3 lb 3.2 oz (-1.453 kg)   Hemodynamic parameters for last 24 hours:    Intake/Output from previous day: 05/26 0701 - 05/27 0700 In: 7342 [P.O.:1200; I.V.:407; IV Piggyback:150] Out: 2330 [Urine:2330]  Intake/Output this shift: No intake/output data recorded.  Current Meds: Scheduled Meds: .  amiodarone  400 mg Oral Q12H   Followed by  . [START ON 09/21/2016] amiodarone  400 mg Oral Daily  . aspirin EC  325 mg Oral Daily   Or  . aspirin  324 mg Per Tube Daily  . bisacodyl  10 mg Oral Daily   Or  . bisacodyl  10 mg Rectal Daily  . budesonide (PULMICORT) nebulizer solution  0.5 mg Nebulization BID  . docusate sodium  200 mg Oral Daily  . dolutegravir  50 mg Oral Daily  . emtricitabine-tenofovir AF  1 tablet Oral Daily  . enoxaparin (LOVENOX) injection  40 mg Subcutaneous Daily  . furosemide  40 mg Intravenous BID  . guaiFENesin  600 mg Oral BID  . insulin aspart  0-24 Units Subcutaneous TID AC & HS  . insulin detemir  28 Units Subcutaneous BID  . levalbuterol  1.25 mg Nebulization TID  . metolazone  10 mg Oral QAC breakfast  . metoprolol tartrate  25 mg Oral BID  . pantoprazole  40 mg Oral Daily  . potassium chloride  40 mEq Oral BID  . pravastatin  40 mg Oral QPM  . sertraline  50 mg Oral Daily  . sodium chloride flush  3 mL Intravenous Q12H   Continuous Infusions: . sodium chloride Stopped (09/10/16 0900)  . sodium chloride Stopped (09/10/16 0800)  . sodium chloride Stopped (09/09/16 2000)  .  amiodarone 30 mg/hr (09/14/16 0318)  . amiodarone Stopped (09/14/16 1700)  . cefTAZidime (FORTAZ)  IV Stopped (09/15/16 0530)  . diltiazem (CARDIZEM) infusion 10 mg/hr (09/15/16 0700)  . lactated ringers 10 mL/hr at 09/12/16 0100  . lactated ringers     PRN Meds:.sodium chloride, metoprolol tartrate, ondansetron (ZOFRAN) IV, oxyCODONE, sodium chloride flush, traMADol  General appearance: alert, cooperative and no distress Neurologic: intact Heart: regular rate and rhythm, S1, S2 normal, no murmur, click, rub or gallop Lungs: diminished breath sounds bibasilar Abdomen: soft, non-tender; bowel sounds normal; no masses,  no organomegaly Extremities: extremities normal, atraumatic, no cyanosis or edema and Homans sign is negative, no sign of DVT Wound: intact  Lab  Results: CBC:  Recent Labs  09/14/16 0203 09/15/16 0206  WBC 13.3* 11.9*  HGB 11.8* 11.3*  HCT 36.1* 34.5*  PLT 219 240   BMET:   Recent Labs  09/14/16 0203 09/15/16 0206  NA 133* 131*  K 3.1* 2.8*  CL 91* 88*  CO2 29 31  GLUCOSE 113* 101*  BUN 26* 33*  CREATININE 1.28* 1.53*  CALCIUM 8.8* 8.5*    CMET: Lab Results  Component Value Date   WBC 11.9 (H) 09/15/2016   HGB 11.3 (L) 09/15/2016   HCT 34.5 (L) 09/15/2016   PLT 240 09/15/2016   GLUCOSE 101 (H) 09/15/2016   CHOL 116 09/08/2016   TRIG 189 (H) 09/08/2016   HDL 28 (L) 09/08/2016   LDLDIRECT 74.0 04/12/2011   LDLCALC 50 09/08/2016   ALT 36 09/13/2016   AST 31 09/13/2016   NA 131 (L) 09/15/2016   K 2.8 (L) 09/15/2016   CL 88 (L) 09/15/2016   CREATININE 1.53 (H) 09/15/2016   BUN 33 (H) 09/15/2016   CO2 31 09/15/2016   TSH 4.584 (H) 09/06/2016   INR 1.16 09/09/2016   HGBA1C 10.6 (H) 09/06/2016      PT/INR: No results for input(s): LABPROT, INR in the last 72 hours. Radiology: Dg Chest Port 1 View  Result Date: 09/15/2016 CLINICAL DATA:  Status post CABG. EXAM: PORTABLE CHEST 1 VIEW COMPARISON:  09/14/2016. FINDINGS: Cardiomegaly. Sequelae of prior CABG. Mild vascular congestion. No active infiltrates or failure. IMPRESSION: Improved aeration.  Cardiomegaly with mild vascular congestion. Electronically Signed   By: Staci Righter M.D.   On: 09/15/2016 07:23   Chronic Kidney Disease   Stage I     GFR >90  Stage II    GFR 60-89  Stage IIIA GFR 45-59  Stage IIIB GFR 30-44  Stage IV   GFR 15-29  Stage V    GFR  <15  Lab Results  Component Value Date   CREATININE 1.53 (H) 09/15/2016   Estimated Creatinine Clearance: 71.8 mL/min (A) (by C-G formula based on SCr of 1.53 mg/dL (H)).  Assessment/Plan: S/P Procedure(s) (LRB): CORONARY ARTERY BYPASS GRAFTING times three  using left internal mammary artery and right saphenous vein harvest (N/A) TRANSESOPHAGEAL ECHOCARDIOGRAM (TEE) (N/A) VIDEO  BRONCHOSCOPY (N/A) Mobilize Diuresis  Back in afib with controlled rate some last night , sinus this am  now on iv Cardizem and amino will continue aminio and d/c iv Cardizem - currently not on anticoagulation other then lovenox 40 mg/day pacing wires still in ,  tsh mildly elevated and has thyroid nodules that will need bx as outpatient   Replace Maryan Puls 09/15/2016 7:53 AMPatient ID: [name], male   DOB: 12-13-1965, 51 y.o.   MRN: [id number]

## 2016-09-16 LAB — GLUCOSE, CAPILLARY
Glucose-Capillary: 105 mg/dL — ABNORMAL HIGH (ref 65–99)
Glucose-Capillary: 161 mg/dL — ABNORMAL HIGH (ref 65–99)
Glucose-Capillary: 218 mg/dL — ABNORMAL HIGH (ref 65–99)
Glucose-Capillary: 135 mg/dL — ABNORMAL HIGH (ref 65–99)

## 2016-09-16 LAB — CBC
HCT: 35.3 % — ABNORMAL LOW (ref 39.0–52.0)
Hemoglobin: 11.5 g/dL — ABNORMAL LOW (ref 13.0–17.0)
MCH: 28 pg (ref 26.0–34.0)
MCHC: 32.6 g/dL (ref 30.0–36.0)
MCV: 85.9 fL (ref 78.0–100.0)
Platelets: 261 10*3/uL (ref 150–400)
RBC: 4.11 MIL/uL — ABNORMAL LOW (ref 4.22–5.81)
RDW: 13.3 % (ref 11.5–15.5)
WBC: 9.5 10*3/uL (ref 4.0–10.5)

## 2016-09-16 LAB — BASIC METABOLIC PANEL
Anion gap: 13 (ref 5–15)
BUN: 32 mg/dL — ABNORMAL HIGH (ref 6–20)
CO2: 30 mmol/L (ref 22–32)
Calcium: 8.6 mg/dL — ABNORMAL LOW (ref 8.9–10.3)
Chloride: 88 mmol/L — ABNORMAL LOW (ref 101–111)
Creatinine, Ser: 1.39 mg/dL — ABNORMAL HIGH (ref 0.61–1.24)
GFR calc Af Amer: >60 mL/min (ref >60)
GFR calc non Af Amer: 56 mL/min — ABNORMAL LOW (ref >60)
Glucose, Bld: 112 mg/dL — ABNORMAL HIGH (ref 65–99)
Potassium: 2.8 mmol/L — ABNORMAL LOW (ref 3.5–5.1)
Sodium: 131 mmol/L — ABNORMAL LOW (ref 135–145)

## 2016-09-16 LAB — PROTIME-INR
INR: 1.01
Prothrombin Time: 13.3 seconds (ref 11.4–15.2)

## 2016-09-16 LAB — MAGNESIUM: Magnesium: 2.4 mg/dL (ref 1.7–2.4)

## 2016-09-16 MED ORDER — DOCUSATE SODIUM 100 MG PO CAPS
200.0000 mg | ORAL_CAPSULE | Freq: Every day | ORAL | Status: DC
  Administered 2016-09-16 – 2016-09-18 (×3): 200 mg via ORAL
  Filled 2016-09-16 (×4): qty 2

## 2016-09-16 MED ORDER — ONDANSETRON HCL 4 MG PO TABS: 4.0000 mg | ORAL_TABLET | Freq: Four times a day (QID) | ORAL | Status: DC | PRN

## 2016-09-16 MED ORDER — ONDANSETRON HCL 4 MG/2ML IJ SOLN
4.0000 mg | Freq: Four times a day (QID) | INTRAMUSCULAR | Status: DC | PRN
  Administered 2016-09-16: 4 mg via INTRAVENOUS

## 2016-09-16 MED ORDER — BISACODYL 5 MG PO TBEC: 10.0000 mg | DELAYED_RELEASE_TABLET | Freq: Every day | ORAL | Status: DC | PRN

## 2016-09-16 MED ORDER — MOVING RIGHT ALONG BOOK
Freq: Once | Status: AC
  Administered 2016-09-16: 08:00:00
  Filled 2016-09-16: qty 1

## 2016-09-16 MED ORDER — METOCLOPRAMIDE HCL 5 MG/ML IJ SOLN: 10.0000 mg | Freq: Once | INTRAMUSCULAR | Status: AC

## 2016-09-16 MED ORDER — METOPROLOL TARTRATE 25 MG PO TABS
25.0000 mg | ORAL_TABLET | Freq: Two times a day (BID) | ORAL | Status: DC
  Administered 2016-09-16: 25 mg via ORAL
  Filled 2016-09-16 (×2): qty 1

## 2016-09-16 MED ORDER — PANTOPRAZOLE SODIUM 40 MG PO TBEC
40.0000 mg | DELAYED_RELEASE_TABLET | Freq: Every day | ORAL | Status: DC
  Administered 2016-09-16 – 2016-09-19 (×4): 40 mg via ORAL
  Filled 2016-09-16 (×4): qty 1

## 2016-09-16 MED ORDER — POTASSIUM CHLORIDE CRYS ER 20 MEQ PO TBCR
40.0000 meq | EXTENDED_RELEASE_TABLET | Freq: Three times a day (TID) | ORAL | Status: DC
  Administered 2016-09-16 (×4): 40 meq via ORAL
  Filled 2016-09-16 (×5): qty 2

## 2016-09-16 MED ORDER — BISACODYL 10 MG RE SUPP: 10.0000 mg | Freq: Every day | RECTAL | Status: DC | PRN

## 2016-09-16 MED ORDER — INSULIN ASPART 100 UNIT/ML ~~LOC~~ SOLN
0.0000 [IU] | Freq: Three times a day (TID) | SUBCUTANEOUS | Status: DC
  Administered 2016-09-16: 8 [IU] via SUBCUTANEOUS
  Administered 2016-09-16: 2 [IU] via SUBCUTANEOUS
  Administered 2016-09-16: 4 [IU] via SUBCUTANEOUS
  Administered 2016-09-17: 2 [IU] via SUBCUTANEOUS
  Administered 2016-09-17 (×2): 4 [IU] via SUBCUTANEOUS
  Administered 2016-09-17 – 2016-09-19 (×3): 2 [IU] via SUBCUTANEOUS

## 2016-09-16 MED ORDER — TRAMADOL HCL 50 MG PO TABS: 50.0000 mg | ORAL_TABLET | ORAL | Status: DC | PRN

## 2016-09-16 MED ORDER — ASPIRIN EC 325 MG PO TBEC
325.0000 mg | DELAYED_RELEASE_TABLET | Freq: Every day | ORAL | Status: DC
  Administered 2016-09-16 – 2016-09-19 (×4): 325 mg via ORAL
  Filled 2016-09-16 (×4): qty 1

## 2016-09-16 MED ORDER — ONDANSETRON HCL 4 MG/2ML IJ SOLN
INTRAMUSCULAR | Status: AC
  Filled 2016-09-16: qty 2

## 2016-09-16 MED ORDER — ACETAMINOPHEN 325 MG PO TABS: 650.0000 mg | ORAL_TABLET | Freq: Four times a day (QID) | ORAL | Status: DC | PRN

## 2016-09-16 MED ORDER — MAGNESIUM HYDROXIDE 400 MG/5ML PO SUSP
30.0000 mL | Freq: Every day | ORAL | Status: DC | PRN
  Administered 2016-09-18: 30 mL via ORAL
  Filled 2016-09-16: qty 30

## 2016-09-16 MED ORDER — OXYCODONE HCL 5 MG PO TABS
5.0000 mg | ORAL_TABLET | ORAL | Status: DC | PRN
  Administered 2016-09-16 – 2016-09-19 (×10): 10 mg via ORAL
  Filled 2016-09-16 (×10): qty 2

## 2016-09-16 MED ORDER — SODIUM CHLORIDE 0.9 % IV SOLN: 250.0000 mL | INTRAVENOUS | Status: DC | PRN

## 2016-09-16 MED ORDER — SODIUM CHLORIDE 0.9% FLUSH: 3.0000 mL | INTRAVENOUS | Status: DC | PRN

## 2016-09-16 MED ORDER — SODIUM CHLORIDE 0.9% FLUSH
3.0000 mL | Freq: Two times a day (BID) | INTRAVENOUS | Status: DC
  Administered 2016-09-16 – 2016-09-19 (×6): 3 mL via INTRAVENOUS

## 2016-09-16 NOTE — Plan of Care (Signed)
Report to2W RN

## 2016-09-16 NOTE — Progress Notes (Signed)
Progress Note  Patient Name: [name] Date of Encounter: 09/16/2016  Primary Cardiologist: Angelena Form  Subjective   Chest wall is sore but improving. No dyspnea.   Inpatient Medications    Scheduled Meds: . amiodarone  400 mg Oral Q12H   Followed by  . [START ON 09/21/2016] amiodarone  400 mg Oral Daily  . aspirin EC  325 mg Oral Daily  . budesonide (PULMICORT) nebulizer solution  0.5 mg Nebulization BID  . docusate sodium  200 mg Oral Daily  . dolutegravir  50 mg Oral Daily  . emtricitabine-tenofovir AF  1 tablet Oral Daily  . enoxaparin (LOVENOX) injection  30 mg Subcutaneous Daily  . guaiFENesin  600 mg Oral BID  . insulin aspart  0-24 Units Subcutaneous TID AC & HS  . insulin detemir  28 Units Subcutaneous BID  . levalbuterol  1.25 mg Nebulization BID  . metoprolol tartrate  25 mg Oral BID  . moving right along book   Does not apply Once  . pantoprazole  40 mg Oral QAC breakfast  . potassium chloride  40 mEq Oral TID  . pravastatin  40 mg Oral QPM  . sertraline  50 mg Oral Daily  . sodium chloride flush  3 mL Intravenous Q12H   Continuous Infusions: . sodium chloride    . cefTAZidime (FORTAZ)  IV Stopped (09/16/16 0701)   PRN Meds: sodium chloride, acetaminophen, bisacodyl **OR** bisacodyl, magnesium hydroxide, ondansetron **OR** ondansetron (ZOFRAN) IV, oxyCODONE, sodium chloride flush, traMADol   Vital Signs    Vitals:   09/16/16 0500 09/16/16 0600 09/16/16 0720 09/16/16 0724  BP: 111/64 102/70    Pulse: 74 72    Resp: 18 17    Temp:   97.9 F (36.6 C)   TempSrc:   Oral   SpO2: 97% 95%  95%  Weight: 267 lb 6.4 oz (121.3 kg)     Height:        Intake/Output Summary (Last 24 hours) at 09/16/16 0800 Last data filed at 09/16/16 0701  Gross per 24 hour  Intake             1000 ml  Output             2825 ml  Net            -1825 ml   Filed Weights   09/14/16 0500 09/15/16 0600 09/16/16 0500  Weight: 270 lb 6.4 oz (122.7 kg) 267 lb 3.2 oz (121.2 kg)  267 lb 6.4 oz (121.3 kg)    Telemetry    sinus - Personally Reviewed  ECG    No am EKG  Physical Exam   GEN: No acute distress.   Neck: No JVD Cardiac: RRR, no murmurs, rubs, or gallops.  Respiratory: Clear to auscultation bilaterally. GI: Soft, nontender, non-distended  Neuro:  Nonfocal  Psych: Normal affect  Ext: trace bilat LE edema  Labs    Chemistry Recent Labs Lab 09/12/16 0407  09/13/16 0313  09/15/16 0206 09/15/16 1441 09/16/16 0344  NA 136  < > 135  < > 131* 129* 131*  K 3.3*  < > 3.5  < > 2.8* 3.1* 2.8*  CL 98*  < > 93*  < > 88* 86* 88*  CO2 29  < > 32  < > 31 34* 30  GLUCOSE 141*  < > 126*  < > 101* 176* 112*  BUN 15  < > 24*  < > 33* 33* 32*  CREATININE 1.14  < >  1.43*  < > 1.53* 1.64* 1.39*  CALCIUM 8.5*  < > 8.8*  < > 8.5* 8.7* 8.6*  PROT 5.8*  --  6.1*  --   --   --   --   ALBUMIN 2.9*  --  2.9*  --   --   --   --   AST 28  --  31  --   --   --   --   ALT 35  --  36  --   --   --   --   ALKPHOS 68  --  107  --   --   --   --   BILITOT 0.8  --  0.6  --   --   --   --   GFRNONAA >60  < > 55*  < > 50* 46* 56*  GFRAA >60  < > >60  < > 58* 54* >60  ANIONGAP 9  < > 10  < > 12 9 13   < > = values in this interval not displayed.   Hematology Recent Labs Lab 09/14/16 0203 09/15/16 0206 09/16/16 0344  WBC 13.3* 11.9* 9.5  RBC 4.22 4.07* 4.11*  HGB 11.8* 11.3* 11.5*  HCT 36.1* 34.5* 35.3*  MCV 85.5 84.8 85.9  MCH 28.0 27.8 28.0  MCHC 32.7 32.8 32.6  RDW 13.6 13.3 13.3  PLT 219 240 261    Radiology    Dg Chest Port 1 View  Result Date: 09/15/2016 CLINICAL DATA:  Status post CABG. EXAM: PORTABLE CHEST 1 VIEW COMPARISON:  09/14/2016. FINDINGS: Cardiomegaly. Sequelae of prior CABG. Mild vascular congestion. No active infiltrates or failure. IMPRESSION: Improved aeration.  Cardiomegaly with mild vascular congestion. Electronically Signed   By: Staci Righter M.D.   On: 09/15/2016 07:23    Cardiac Studies   Cardiac cath 09/03/16: Diagnostic  Diagram          Patient Profile     51 yo male with history of HIV, HLD, tobacco abuse, OSA, DM and CAD who was admitted with unstable angina. Cardiac cath with multi-vessel CAD. He is now s/p 3V CABG  Assessment & Plan    1. CAD s/p CABG: He is now POD #7 following 3 V CABG. He is doing well. He is on ASA, statin and beta blocker  2. Atrial fib, post-op: In sinus this am. He is on amiodarone.    Signed, Lauree Chandler, MD  09/16/2016, 8:00 AM

## 2016-09-16 NOTE — Progress Notes (Addendum)
Patient ID: [name], male   DOB: 1965/09/04, 51 y.o.   MRN: [id number] T[facility] DAILY ICU PROGRESS NOTE                   [place]            ,[place] [address]          [phone]   7 Days Post-Op Procedure(s) (LRB): CORONARY ARTERY BYPASS GRAFTING times three  using left internal mammary artery and right saphenous vein harvest (N/A) TRANSESOPHAGEAL ECHOCARDIOGRAM (TEE) (N/A) VIDEO BRONCHOSCOPY (N/A)  Total Length of Stay:  LOS: 14 days   Subjective: Feels well this am, holding sinus   Objective: Vital signs in last 24 hours: Temp:  [97.2 F (36.2 C)-98.1 F (36.7 C)] 98.1 F (36.7 C) (05/28 0300) Pulse Rate:  [44-138] 72 (05/28 0600) Cardiac Rhythm: Normal sinus rhythm (05/28 0400) Resp:  [14-25] 17 (05/28 0600) BP: (96-142)/(58-85) 102/70 (05/28 0600) SpO2:  [88 %-99 %] 95 % (05/28 0600) Weight:  [267 lb 6.4 oz (121.3 kg)] 267 lb 6.4 oz (121.3 kg) (05/28 0500)  Filed Weights   09/14/16 0500 09/15/16 0600 09/16/16 0500  Weight: 270 lb 6.4 oz (122.7 kg) 267 lb 3.2 oz (121.2 kg) 267 lb 6.4 oz (121.3 kg)    Weight change: 3.2 oz (0.092 kg)   Hemodynamic parameters for last 24 hours:    Intake/Output from previous day: 05/27 0701 - 05/28 0700 In: 1010 [P.O.:840; I.V.:20; IV Piggyback:150] Out: 2425 [Urine:2425]  Intake/Output this shift: No intake/output data recorded.  Current Meds: Scheduled Meds: . amiodarone  400 mg Oral Q12H   Followed by  . [START ON 09/21/2016] amiodarone  400 mg Oral Daily  . aspirin EC  325 mg Oral Daily   Or  . aspirin  324 mg Per Tube Daily  . bisacodyl  10 mg Oral Daily   Or  . bisacodyl  10 mg Rectal Daily  . budesonide (PULMICORT) nebulizer solution  0.5 mg Nebulization BID  . docusate sodium  200 mg Oral Daily  . dolutegravir  50 mg Oral Daily  . emtricitabine-tenofovir AF  1 tablet Oral Daily  . enoxaparin (LOVENOX) injection  30 mg Subcutaneous Daily  . furosemide  40 mg Intravenous Daily  .  guaiFENesin  600 mg Oral BID  . insulin aspart  0-24 Units Subcutaneous TID AC & HS  . insulin detemir  28 Units Subcutaneous BID  . levalbuterol  1.25 mg Nebulization BID  . metoprolol tartrate  25 mg Oral BID  . pantoprazole  40 mg Oral Daily  . potassium chloride  40 mEq Oral TID  . pravastatin  40 mg Oral QPM  . sertraline  50 mg Oral Daily  . sodium chloride flush  3 mL Intravenous Q12H   Continuous Infusions: . sodium chloride Stopped (09/10/16 0900)  . sodium chloride Stopped (09/10/16 0800)  . sodium chloride Stopped (09/09/16 2000)  . cefTAZidime (FORTAZ)  IV 1 g (09/16/16 0631)  . lactated ringers 10 mL/hr at 09/12/16 0100  . lactated ringers    . potassium chloride     PRN Meds:.sodium chloride, metoprolol tartrate, ondansetron (ZOFRAN) IV, oxyCODONE, sodium chloride flush, traMADol  General appearance: alert and cooperative Neurologic: intact Heart: regular rate and rhythm, S1, S2 normal, no murmur, click, rub or gallop Lungs: clear to auscultation bilaterally Abdomen: soft, non-tender; bowel sounds normal; no masses,  no organomegaly Extremities: extremities normal, atraumatic, no cyanosis or edema and Homans sign is  negative, no sign of DVT Wound: sternum stable  Lab Results: CBC: Recent Labs  09/15/16 0206 09/16/16 0344  WBC 11.9* 9.5  HGB 11.3* 11.5*  HCT 34.5* 35.3*  PLT 240 261   BMET:  Recent Labs  09/15/16 1441 09/16/16 0344  NA 129* 131*  K 3.1* 2.8*  CL 86* 88*  CO2 34* 30  GLUCOSE 176* 112*  BUN 33* 32*  CREATININE 1.64* 1.39*  CALCIUM 8.7* 8.6*    CMET: Lab Results  Component Value Date   WBC 9.5 09/16/2016   HGB 11.5 (L) 09/16/2016   HCT 35.3 (L) 09/16/2016   PLT 261 09/16/2016   GLUCOSE 112 (H) 09/16/2016   CHOL 116 09/08/2016   TRIG 189 (H) 09/08/2016   HDL 28 (L) 09/08/2016   LDLDIRECT 74.0 04/12/2011   LDLCALC 50 09/08/2016   ALT 36 09/13/2016   AST 31 09/13/2016   NA 131 (L) 09/16/2016   K 2.8 (L) 09/16/2016   CL 88  (L) 09/16/2016   CREATININE 1.39 (H) 09/16/2016   BUN 32 (H) 09/16/2016   CO2 30 09/16/2016   TSH 4.584 (H) 09/06/2016   INR 1.01 09/16/2016   HGBA1C 10.6 (H) 09/06/2016      PT/INR:  Recent Labs  09/16/16 0344  LABPROT 13.3  INR 1.01   Radiology: No results found.   Assessment/Plan: S/P Procedure(s) (LRB): CORONARY ARTERY BYPASS GRAFTING times three  using left internal mammary artery and right saphenous vein harvest (N/A) TRANSESOPHAGEAL ECHOCARDIOGRAM (TEE) (N/A) VIDEO BRONCHOSCOPY (N/A) Mobilize Plan for transfer to step-down: see transfer orders Hold all diuretic , aggressive replace kcl po Home 1-2 days  No bowel movement - Magnesium checked this am 2.2  On fortaz, wbc nl and no fever consider stopping soon  [name] 09/16/2016 7:14 AM

## 2016-09-16 NOTE — Progress Notes (Signed)
Transferred to 2W02 via wheelchair and monitor, SCD's with pt, staff in room to receive

## 2016-09-16 NOTE — Progress Notes (Signed)
AM lab potassium 2.8; no central lines; MD notified; verbal order given PO potassium 30meq TID.  Clyda Hurdle RN

## 2016-09-16 NOTE — Plan of Care (Signed)
Attempted to call report to 2W 

## 2016-09-17 LAB — BASIC METABOLIC PANEL
Anion gap: 12 (ref 5–15)
BUN: 30 mg/dL — ABNORMAL HIGH (ref 6–20)
CO2: 29 mmol/L (ref 22–32)
Calcium: 8.8 mg/dL — ABNORMAL LOW (ref 8.9–10.3)
Chloride: 90 mmol/L — ABNORMAL LOW (ref 101–111)
Creatinine, Ser: 1.3 mg/dL — ABNORMAL HIGH (ref 0.61–1.24)
GFR calc Af Amer: >60 mL/min (ref >60)
GFR calc non Af Amer: >60 mL/min (ref >60)
Glucose, Bld: 156 mg/dL — ABNORMAL HIGH (ref 65–99)
Potassium: 3 mmol/L — ABNORMAL LOW (ref 3.5–5.1)
Sodium: 131 mmol/L — ABNORMAL LOW (ref 135–145)

## 2016-09-17 LAB — CBC
HCT: 35.9 % — ABNORMAL LOW (ref 39.0–52.0)
Hemoglobin: 11.4 g/dL — ABNORMAL LOW (ref 13.0–17.0)
MCH: 27.7 pg (ref 26.0–34.0)
MCHC: 31.8 g/dL (ref 30.0–36.0)
MCV: 87.1 fL (ref 78.0–100.0)
Platelets: 286 10*3/uL (ref 150–400)
RBC: 4.12 MIL/uL — ABNORMAL LOW (ref 4.22–5.81)
RDW: 13.5 % (ref 11.5–15.5)
WBC: 11.4 10*3/uL — ABNORMAL HIGH (ref 4.0–10.5)

## 2016-09-17 LAB — GLUCOSE, CAPILLARY
GLUCOSE-CAPILLARY: 173 mg/dL — AB (ref 65–99)
GLUCOSE-CAPILLARY: 164 mg/dL — AB (ref 65–99)
Glucose-Capillary: 129 mg/dL — ABNORMAL HIGH (ref 65–99)
Glucose-Capillary: 131 mg/dL — ABNORMAL HIGH (ref 65–99)

## 2016-09-17 MED ORDER — POTASSIUM CHLORIDE CRYS ER 20 MEQ PO TBCR
40.0000 meq | EXTENDED_RELEASE_TABLET | Freq: Three times a day (TID) | ORAL | Status: DC
  Administered 2016-09-17 – 2016-09-19 (×8): 40 meq via ORAL
  Filled 2016-09-17 (×7): qty 2

## 2016-09-17 MED ORDER — AMIODARONE HCL 200 MG PO TABS
400.0000 mg | ORAL_TABLET | Freq: Two times a day (BID) | ORAL | Status: DC
  Administered 2016-09-17 – 2016-09-19 (×5): 400 mg via ORAL
  Filled 2016-09-17 (×5): qty 2

## 2016-09-17 MED ORDER — GLIMEPIRIDE 4 MG PO TABS
4.0000 mg | ORAL_TABLET | Freq: Every day | ORAL | Status: DC
  Administered 2016-09-18 – 2016-09-19 (×2): 4 mg via ORAL
  Filled 2016-09-17 (×2): qty 1

## 2016-09-17 MED ORDER — DILTIAZEM HCL 100 MG IV SOLR: 10.0000 mg/h | INTRAVENOUS | Status: DC

## 2016-09-17 MED ORDER — AMIODARONE HCL 200 MG PO TABS: 400.0000 mg | ORAL_TABLET | Freq: Every day | ORAL | Status: DC

## 2016-09-17 MED ORDER — METOPROLOL TARTRATE 25 MG PO TABS
25.0000 mg | ORAL_TABLET | Freq: Two times a day (BID) | ORAL | Status: DC
  Administered 2016-09-17 – 2016-09-19 (×5): 25 mg via ORAL
  Filled 2016-09-17 (×5): qty 1

## 2016-09-17 MED ORDER — METFORMIN HCL 500 MG PO TABS
1000.0000 mg | ORAL_TABLET | Freq: Two times a day (BID) | ORAL | Status: DC
  Administered 2016-09-17 – 2016-09-19 (×4): 1000 mg via ORAL
  Filled 2016-09-17 (×4): qty 2

## 2016-09-17 NOTE — Progress Notes (Signed)
Progress Note  Patient Name: FRANKIE SCIPIO Date of Encounter: 09/17/2016  Primary Cardiologist: Angelena Form  Subjective   No chest pain or dyspnea this am.   Inpatient Medications    Scheduled Meds: . amiodarone  400 mg Oral Q12H   Followed by  . [START ON 09/21/2016] amiodarone  400 mg Oral Daily  . aspirin EC  325 mg Oral Daily  . budesonide (PULMICORT) nebulizer solution  0.5 mg Nebulization BID  . docusate sodium  200 mg Oral Daily  . dolutegravir  50 mg Oral Daily  . emtricitabine-tenofovir AF  1 tablet Oral Daily  . enoxaparin (LOVENOX) injection  30 mg Subcutaneous Daily  . glimepiride  4 mg Oral Q breakfast  . guaiFENesin  600 mg Oral BID  . insulin aspart  0-24 Units Subcutaneous TID AC & HS  . levalbuterol  1.25 mg Nebulization BID  . metFORMIN  1,000 mg Oral BID WC  . metoprolol tartrate  25 mg Oral BID  . pantoprazole  40 mg Oral QAC breakfast  . potassium chloride  40 mEq Oral TID  . pravastatin  40 mg Oral QPM  . sertraline  50 mg Oral Daily  . sodium chloride flush  3 mL Intravenous Q12H   Continuous Infusions: . sodium chloride    . cefTAZidime (FORTAZ)  IV Stopped (09/17/16 0532)  . diltiazem (CARDIZEM) infusion     PRN Meds: sodium chloride, acetaminophen, bisacodyl **OR** bisacodyl, magnesium hydroxide, ondansetron **OR** ondansetron (ZOFRAN) IV, oxyCODONE, sodium chloride flush, traMADol   Vital Signs    Vitals:   09/17/16 0318 09/17/16 0427 09/17/16 0614 09/17/16 0738  BP: (!) 115/57 (!) 100/56 90/63   Pulse: 87     Resp: 18     Temp: 98.2 F (36.8 C)     TempSrc: Oral     SpO2: 97%   95%  Weight: 266 lb 12.8 oz (121 kg)     Height:        Intake/Output Summary (Last 24 hours) at 09/17/16 0925 Last data filed at 09/16/16 2148  Gross per 24 hour  Intake              700 ml  Output              800 ml  Net             -100 ml   Filed Weights   09/15/16 0600 09/16/16 0500 09/17/16 0318  Weight: 267 lb 3.2 oz (121.2 kg) 267 lb 6.4 oz  (121.3 kg) 266 lb 12.8 oz (121 kg)    Telemetry    Sinus. I have personally reviewed. He did have atrial fib this am.   ECG    09/17/16: atrial fib, rate 146 bpm  Physical Exam   General: Well developed, well nourished, NAD  HEENT: OP clear, mucus membranes moist  SKIN: warm, dry. No rashes. Neuro: No focal deficits  Musculoskeletal: Muscle strength 5/5 all ext  Psychiatric: Mood and affect normal  Neck: No JVD, no carotid bruits, no thyromegaly, no lymphadenopathy.  Lungs:Clear bilaterally, no wheezes, rhonci, crackles Cardiovascular: Regular rate and rhythm. No murmurs, gallops or rubs. Abdomen:Soft. Bowel sounds present. Non-tender.  Extremities: No lower extremity edema. Pulses are 2 + in the bilateral DP/PT.    Labs    Chemistry Recent Labs Lab 09/12/16 0407  09/13/16 0313  09/15/16 1441 09/16/16 0344 09/17/16 0202  NA 136  < > 135  < > 129* 131* 131*  K 3.3*  < >  3.5  < > 3.1* 2.8* 3.0*  CL 98*  < > 93*  < > 86* 88* 90*  CO2 29  < > 32  < > 34* 30 29  GLUCOSE 141*  < > 126*  < > 176* 112* 156*  BUN 15  < > 24*  < > 33* 32* 30*  CREATININE 1.14  < > 1.43*  < > 1.64* 1.39* 1.30*  CALCIUM 8.5*  < > 8.8*  < > 8.7* 8.6* 8.8*  PROT 5.8*  --  6.1*  --   --   --   --   ALBUMIN 2.9*  --  2.9*  --   --   --   --   AST 28  --  31  --   --   --   --   ALT 35  --  36  --   --   --   --   ALKPHOS 68  --  107  --   --   --   --   BILITOT 0.8  --  0.6  --   --   --   --   GFRNONAA >60  < > 55*  < > 46* 56* >60  GFRAA >60  < > >60  < > 54* >60 >60  ANIONGAP 9  < > 10  < > 9 13 12   < > = values in this interval not displayed.   Hematology  Recent Labs Lab 09/15/16 0206 09/16/16 0344 09/17/16 0202  WBC 11.9* 9.5 11.4*  RBC 4.07* 4.11* 4.12*  HGB 11.3* 11.5* 11.4*  HCT 34.5* 35.3* 35.9*  MCV 84.8 85.9 87.1  MCH 27.8 28.0 27.7  MCHC 32.8 32.6 31.8  RDW 13.3 13.3 13.5  PLT 240 261 286    Radiology    No results found.  Cardiac Studies   Cardiac cath  09/03/16: Diagnostic Diagram          Patient Profile     51 yo male with history of HIV, HLD, tobacco abuse, OSA, DM and CAD who was admitted with unstable angina. Cardiac cath with multi-vessel CAD. He is now s/p 3V CABG. He has had paroxysmal atrial fib following CABG.   Assessment & Plan    1. CAD s/p CABG: He is now 8 days post 3V CABG. Overall doing well. He is having recurrent atrial fib. Continue ASA, statin, beta blocker.   2. Atrial fib, post-op: In sinus this now but had atrial fib with RVR earlier this am. He is on amiodarone and metoprolol. Cannot titrate dose of beta blocker given hypotension. I would consider adding coumadin or a NOAC before discharge.    Signed, Lauree Chandler, MD  09/17/2016, 9:25 AM

## 2016-09-17 NOTE — Progress Notes (Addendum)
[address]       [place] [address]             [phone]      8 Days Post-Op Procedure(s) (LRB): CORONARY ARTERY BYPASS GRAFTING times three  using left internal mammary artery and right saphenous vein harvest (N/A) TRANSESOPHAGEAL ECHOCARDIOGRAM (TEE) (N/A) VIDEO BRONCHOSCOPY (N/A) Subjective: Some afib persists, rapid at times  Objective: Vital signs in last 24 hours: Temp:  [97.9 F (36.6 C)-98.6 F (37 C)] 98.2 F (36.8 C) (05/29 0318) Pulse Rate:  [73-92] 87 (05/29 0318) Cardiac Rhythm: Atrial fibrillation (05/29 0427) Resp:  [11-22] 18 (05/29 0318) BP: (90-128)/(54-65) 90/63 (05/29 0614) SpO2:  [88 %-100 %] 97 % (05/29 0318) Weight:  [266 lb 12.8 oz (121 kg)] 266 lb 12.8 oz (121 kg) (05/29 0318)  Hemodynamic parameters for last 24 hours:    Intake/Output from previous day: 05/28 0701 - 05/29 0700 In: 940 [P.O.:840; IV Piggyback:100] Out: 1200 [Urine:1200] Intake/Output this shift: Total I/O In: 290 [P.O.:240; IV Piggyback:50] Out: -   General appearance: alert, cooperative and no distress Heart: regular rate and rhythm Lungs: clear to auscultation bilaterally Abdomen: soft, non tender Extremities: minimal edema Wound: incis healing well  Lab Results:  Recent Labs  09/16/16 0344 09/17/16 0202  WBC 9.5 11.4*  HGB 11.5* 11.4*  HCT 35.3* 35.9*  PLT 261 286   BMET:  Recent Labs  09/16/16 0344 09/17/16 0202  NA 131* 131*  K 2.8* 3.0*  CL 88* 90*  CO2 30 29  GLUCOSE 112* 156*  BUN 32* 30*  CREATININE 1.39* 1.30*  CALCIUM 8.6* 8.8*    PT/INR:  Recent Labs  09/16/16 0344  LABPROT 13.3  INR 1.01   ABG    Component Value Date/Time   PHART 7.399 09/10/2016 0350   HCO3 20.8 09/10/2016 0350   TCO2 24 09/11/2016 1607   ACIDBASEDEF 3.2 (H) 09/10/2016 0350   O2SAT 94.0 09/10/2016 0350   CBG (last 3)   Recent Labs  09/16/16 1556 09/16/16 2136 09/17/16 0615  GLUCAP 218* 135* 129*    Meds Scheduled  Meds: . amiodarone  400 mg Oral Q12H   Followed by  . [START ON 09/21/2016] amiodarone  400 mg Oral Daily  . aspirin EC  325 mg Oral Daily  . budesonide (PULMICORT) nebulizer solution  0.5 mg Nebulization BID  . docusate sodium  200 mg Oral Daily  . dolutegravir  50 mg Oral Daily  . emtricitabine-tenofovir AF  1 tablet Oral Daily  . enoxaparin (LOVENOX) injection  30 mg Subcutaneous Daily  . guaiFENesin  600 mg Oral BID  . insulin aspart  0-24 Units Subcutaneous TID AC & HS  . insulin detemir  28 Units Subcutaneous BID  . levalbuterol  1.25 mg Nebulization BID  . metoprolol tartrate  25 mg Oral BID  . pantoprazole  40 mg Oral QAC breakfast  . potassium chloride  40 mEq Oral TID  . pravastatin  40 mg Oral QPM  . sertraline  50 mg Oral Daily  . sodium chloride flush  3 mL Intravenous Q12H   Continuous Infusions: . sodium chloride    . cefTAZidime (FORTAZ)  IV Stopped (09/17/16 0532)   PRN Meds:.sodium chloride, acetaminophen, bisacodyl **OR** bisacodyl, magnesium hydroxide, ondansetron **OR** ondansetron (ZOFRAN) IV, oxyCODONE, sodium chloride flush, traMADol  Xrays No results found.  Assessment/Plan: S/P Procedure(s) (LRB): CORONARY ARTERY BYPASS GRAFTING times three  using left internal mammary artery and right saphenous vein  harvest (N/A) TRANSESOPHAGEAL ECHOCARDIOGRAM (TEE) (N/A) VIDEO BRONCHOSCOPY (N/A)  1 doing well overall 2 afib on amio and metoprolol, aspirin- may need to consider coumadin or NOAC at this point 3 K+ needs more replacement, creat improved, volume status looks ok 4 sugars fair on insulin- was not on at home - will restart amaryl and glucophage and stop insulin 5? When to stop fortaz? 6 on HIV meds  LOS: 15 days    GOLD,WAYNE E 09/17/2016 Patient has had recurring atrial fibrillation He would benefit from starting Eliquis at the time of discharge We'll stop Tressie Ellis today Remove epicardial wires in a.m.  patient examined and medical record  reviewed,agree with above note. Tharon Aquas Trigt III 09/17/2016

## 2016-09-17 NOTE — Progress Notes (Signed)
CARDIAC REHAB PHASE I   PRE:  Rate/Rhythm: 81 SR  BP:  Sitting: 103/52        SaO2: 96 RA  MODE:  Ambulation: 500 ft   POST:  Rate/Rhythm: 96 SR  BP:  Sitting: 119/56         SaO2: 97 RA  Pt in bed, declined assistance oob, however, pt did not adhere to sternal precautions, relied heavily on use of arms to get oob. Reviewed sternal precautions, pt continues to use arms despite reminder cues. Pt ambulated 500 ft on RA, independent, steady gait, tolerated well, denies any complaints. Encouraged additional ambulation x2 today. Pt to chair after walk, call bell within reach. Will follow.   [phone] Lenna Sciara, RN, BSN 09/17/2016 10:17 AM

## 2016-09-17 NOTE — Progress Notes (Signed)
Patient sleeping on his rt side and patient went into At. Fib. RVR rate 140 with no complaints see V.S. Flow sheet. R.N. Aware and EKG done to confirm At Fib. Dr Servando Snare notified see orders to give A.M. Lopressor, Amiodarone and Potassium now.

## 2016-09-17 NOTE — Progress Notes (Signed)
Holding Cardizem drip per Gold PA. Pt. Is SR in the 70's and  BP 90/63. Pt. Can be given gtt if converts into AFIB RVR.

## 2016-09-17 NOTE — Progress Notes (Signed)
Patient converted to [name]

## 2016-09-17 NOTE — Discharge Summary (Signed)
Physician Discharge Summary  Patient ID: HERRON FERO MRN: [id number] DOB/AGE: Dec 21, 1965 51 y.o.  Admit date: 09/02/2016 Discharge date: 09/19/2016  Admission Diagnoses:Non-STEMI myocardial infarction  Discharge Diagnoses:  Principal Problem:   NSTEMI (non-ST elevated myocardial infarction) [facility]) Active Problems:   Essential hypertension   HIV infection ([place])   CAD S/P percutaneous coronary angioplasty   Hyperlipidemia   Glucose intolerance (impaired glucose tolerance)   Tobacco abuse   History of subdural hematoma   Hx of CABG   Multiple thyroid nodules   Atrial flutter ([facility])   Hypokalemia   AKI (acute kidney injury) ([place])   Coronary artery disease involving native heart without angina pectoris  Patient Active Problem List   Diagnosis Date Noted  . Atrial flutter ([place]) 09/15/2016  . Hypokalemia 09/15/2016  . AKI (acute kidney injury) ([place]) 09/15/2016  . Coronary artery disease involving native heart without angina pectoris   . Depression 09/14/2016  . Increased thyroid stimulating hormone (TSH) level 09/14/2016  . Multiple thyroid nodules 09/14/2016  . Hx of CABG 09/09/2016  . NSTEMI (non-ST elevated myocardial infarction) ([place]) 09/03/2016  . Type 2 diabetes mellitus ([place]) 07/10/2016  . History of subdural hematoma 09/30/2015  . Tobacco abuse 06/28/2011  . Essential hypertension   . HIV infection ([place])   . CAD S/P percutaneous coronary angioplasty   . Hyperlipidemia   . Glucose intolerance (impaired glucose tolerance)     History of Present Illness: at time of consultation:     The patient is a 51 year old male with a history of multiple cardiac risk factors as well as other medical problems who presented to the [place] department with new onset of chest pain. The pain started after eating a large meal and radiated to the back. He had a similar episode approximately 2 weeks prior. He is a history of previous myocardial infarction in 2012 but felt these  symptoms were different. At the time of his non-STEMI in 2012 he was found to have a severe stenosis of the circumflex artery and at that time a drug alluding stent was placed. Catheterization at that time showed moderate disease of the other coronary vessels. He did undergo a nuclear stress test in January 2017 which showed no ischemia. He has ruled in for non-STEMI on this admission and underwent cardiac catheterization. The results are listed below. We are asked to see in consultation for consideration of coronary artery surgical revascularization.  Discharged Condition: good  Hospital Course: The patient presented to the emergency department and ruled in for non-STEMI. He was seen in cardiology consultation and admitted for further evaluation and treatment to include cardiac catheterization. Additionally due to his HIV status infectious disease was consulted to assist with care. Cardiac catheterization reveals significant multivessel coronary artery disease and cardiothoracic surgical consultation was obtained. He was recommended coronary artery bypass grafting as his best option for revascularization due to the severity of the anatomical findings. On 09/09/2016 he was taken to the operating room where he underwent the below described procedure.  Post operative Hospital course:  Overall the patient has progressed nicely. He did have a delayed extubation due to retained secretions and collapse-atelectasis of the right lower lobe which required intraoperative bronchoscopy. He was able to be extubated however without difficulty following this. He has remained hemodynamically stable however did have postoperative atrial fibrillation. He has been started on beta blocker as well as amiodarone. He does have a mild expected acute blood loss anemia and values have stabilized. He has had  some postoperative volume overload requiring diuresis but he is responding well. Currently the diuretics have been stopped. He  did have an acute elevation in his creatinine postoperatively but is returning to baseline. Most recent creatinine is 1.3. He has also had some significant hypokalemia but potassium has been replaced. Incisions are noted be healing well without evidence of infection. He is tolerating routine cardiac rehabilitation. Oxygen has been weaned and he maintains good saturations on room air. At time of discharge the patient is felt to be quite stable.  Consults: cardiology and ID  Significant Diagnostic Studies: angiography: cardiac cath  Treatments: surgery:  DATE OF PROCEDURE:  09/09/2016 DATE OF DISCHARGE:                              OPERATIVE REPORT   OPERATION: 1. Coronary artery bypass grafting x3 (left internal mammary artery to     LAD, saphenous vein graft to obtuse marginal, saphenous vein graft     to posterolateral branch of the right coronary). 2. Endoscopic harvest of right leg greater saphenous vein. 3. Fiberoptic bronchoscopy.  SURGEON:  Ivin Poot, MD.  ASSISTANT:  Nicholes Rough, PA-C.  PREOPERATIVE DIAGNOSES:  Non-ST-elevation myocardial infarction, severe multivessel coronary artery disease.  POSTOPERATIVE DIAGNOSES:  Non-ST-elevation myocardial infarction, severe multivessel coronary artery disease.  ANESTHESIA:  General by Dr. Marya Landry.  Discharge Exam: Blood pressure 112/62, pulse 75, temperature 98.3 F (36.8 C), temperature source Oral, resp. rate 18, height 5\' 9"  (1.753 m), weight 267 lb 12.8 oz (121.5 kg), SpO2 97 %.  General appearance: alert, cooperative and no distress Heart: regular rate and rhythm, S1, S2 normal, no murmur, click, rub or gallop Lungs: clear to auscultation bilaterally Abdomen: soft, non-tender; bowel sounds normal; no masses,  no organomegaly Extremities: extremities normal, atraumatic, no cyanosis or edema Wound: clean and dry Disposition: 01-Home or Self Care  Discharge Instructions    Amb Referral to Cardiac  Rehabilitation    Complete by:  As directed    Diagnosis:   CABG NSTEMI     CABG X ___:  3     Allergies as of 09/19/2016      Reactions   No Known Allergies       Medication List    STOP taking these medications   AMBIEN 10 MG tablet Generic drug:  zolpidem   NITROSTAT 0.4 MG SL tablet Generic drug:  nitroGLYCERIN   verapamil 120 MG CR tablet Commonly known as:  CALAN-SR     TAKE these medications   amiodarone 200 MG tablet Commonly known as:  PACERONE Please take 2 tabs (400mg ) twice a day for 5 days then 1 tab (200mg ) twice a day until followup   apixaban 2.5 MG Tabs tablet Commonly known as:  ELIQUIS Take 1 tablet (2.5 mg total) by mouth 2 (two) times daily.   aspirin EC 81 MG tablet Take 1 tablet (81 mg total) by mouth daily.   budesonide 0.5 MG/2ML nebulizer solution Commonly known as:  PULMICORT Take 2 mLs (0.5 mg total) by nebulization 2 (two) times daily.   buPROPion 150 MG 24 hr tablet Commonly known as:  WELLBUTRIN XL Take 150 mg by mouth daily.   cephALEXin 500 MG capsule Commonly known as:  KEFLEX Take 1 capsule (500 mg total) by mouth every 8 (eight) hours.   dolutegravir 50 MG tablet Commonly known as:  TIVICAY Take 50 mg by mouth daily.   emtricitabine-tenofovir AF  200-25 MG tablet Commonly known as:  DESCOVY Take 1 tablet by mouth daily.   furosemide 40 MG tablet Commonly known as:  LASIX Take 40 mg by mouth 2 (two) times daily as needed for fluid.   glimepiride 4 MG tablet Commonly known as:  AMARYL Take 4 mg by mouth daily with breakfast.   guaiFENesin 600 MG 12 hr tablet Commonly known as:  MUCINEX Take 1 tablet (600 mg total) by mouth 2 (two) times daily.   metFORMIN 1000 MG tablet Commonly known as:  GLUCOPHAGE Take 1,000 mg by mouth 2 (two) times daily with a meal.   metoprolol tartrate 25 MG tablet Commonly known as:  LOPRESSOR Take 1 tablet (25 mg total) by mouth 2 (two) times daily. What changed:  how much to  take  additional instructions   oxyCODONE 5 MG immediate release tablet Commonly known as:  Oxy IR/ROXICODONE Take 1 tablet (5 mg total) by mouth every 6 (six) hours as needed for severe pain.   potassium chloride SA 20 MEQ tablet Commonly known as:  K-DUR,KLOR-CON Take 1 tablet (20 mEq total) by mouth 2 (two) times daily.   pravastatin 40 MG tablet Commonly known as:  PRAVACHOL Take 1 tablet (40 mg total) by mouth every evening.   sertraline 50 MG tablet Commonly known as:  ZOLOFT Take 1 tablet (50 mg total) by mouth daily. Start taking on:  09/20/2016 What changed:  medication strength  how much to take      Follow-up Information    [name], MD Follow up.   Specialty:  Cardiology Why:  A two-week follow-up appointment with cardiology will be arranged. Please see discharge paperwork for details. Contact information: [address] [place] [address] [phone]        Ivin Poot, MD Follow up.   Specialty:  Cardiothoracic Surgery Why:  An appointment will be arranged to see the surgeon in 4 weeks. Please see discharge paperwork for details. Please also obtain a chest x-ray at [facility] one half hour prior to this appointment. It is in same office complex.  Contact information: [address] [place] [address] [phone]        Lavone Orn, MD. Call in 1 day(s).   Specialty:  Internal Medicine Contact information: [address] [place]  [address] [phone]          The patient has been discharged on:   1.Beta Blocker:  Yes [ Y ]                              No   [   ]                              If No, reason:  2.Ace Inhibitor/ARB: Yes [   ]                                     No  [N   ]                                     If No, reason:LOW bp 3.Statin:   Yes [ Y  ]  No  [   ]                  If No, reason:  4.Ecasa:  Yes  [  Y ]                   No   [   ]                  If No, reason:  Signed: Dayelin Balducci E 09/19/2016, 12:12 PM

## 2016-09-17 NOTE — Progress Notes (Signed)
H.R. 90-120 At. Fib check B.P. And H.R. Went back up to 130-140 Patient now asleep

## 2016-09-18 LAB — BASIC METABOLIC PANEL
ANION GAP: 9 (ref 5–15)
BUN: 23 mg/dL — ABNORMAL HIGH (ref 6–20)
CO2: 31 mmol/L (ref 22–32)
Calcium: 8.9 mg/dL (ref 8.9–10.3)
Chloride: 94 mmol/L — ABNORMAL LOW (ref 101–111)
Creatinine, Ser: 1.23 mg/dL (ref 0.61–1.24)
GFR calc Af Amer: >60 mL/min (ref >60)
GFR calc non Af Amer: >60 mL/min (ref >60)
GLUCOSE: 117 mg/dL — AB (ref 65–99)
POTASSIUM: 3.8 mmol/L (ref 3.5–5.1)
SODIUM: 134 mmol/L — AB (ref 135–145)

## 2016-09-18 LAB — GLUCOSE, CAPILLARY
GLUCOSE-CAPILLARY: 128 mg/dL — AB (ref 65–99)
GLUCOSE-CAPILLARY: 116 mg/dL — AB (ref 65–99)
GLUCOSE-CAPILLARY: 88 mg/dL (ref 65–99)
Glucose-Capillary: 114 mg/dL — ABNORMAL HIGH (ref 65–99)

## 2016-09-18 MED ORDER — CEPHALEXIN 500 MG PO CAPS
500.0000 mg | ORAL_CAPSULE | Freq: Three times a day (TID) | ORAL | Status: DC
  Administered 2016-09-18 – 2016-09-19 (×5): 500 mg via ORAL
  Filled 2016-09-18 (×6): qty 1

## 2016-09-18 NOTE — Progress Notes (Signed)
Progress Note  Patient Name: [name] Date of Encounter: 09/18/2016  Primary Cardiologist: Angelena Form  Subjective   No chest pain or dyspnea. He feels great  Inpatient Medications    Scheduled Meds: . amiodarone  400 mg Oral Q12H   Followed by  . [START ON 09/21/2016] amiodarone  400 mg Oral Daily  . aspirin EC  325 mg Oral Daily  . budesonide (PULMICORT) nebulizer solution  0.5 mg Nebulization BID  . cephALEXin  500 mg Oral Q8H  . docusate sodium  200 mg Oral Daily  . dolutegravir  50 mg Oral Daily  . emtricitabine-tenofovir AF  1 tablet Oral Daily  . enoxaparin (LOVENOX) injection  30 mg Subcutaneous Daily  . glimepiride  4 mg Oral Q breakfast  . guaiFENesin  600 mg Oral BID  . insulin aspart  0-24 Units Subcutaneous TID AC & HS  . levalbuterol  1.25 mg Nebulization BID  . metFORMIN  1,000 mg Oral BID WC  . metoprolol tartrate  25 mg Oral BID  . pantoprazole  40 mg Oral QAC breakfast  . potassium chloride  40 mEq Oral TID  . pravastatin  40 mg Oral QPM  . sertraline  50 mg Oral Daily  . sodium chloride flush  3 mL Intravenous Q12H   Continuous Infusions: . sodium chloride     PRN Meds: sodium chloride, acetaminophen, bisacodyl **OR** bisacodyl, magnesium hydroxide, ondansetron **OR** ondansetron (ZOFRAN) IV, oxyCODONE, sodium chloride flush, traMADol   Vital Signs    Vitals:   09/17/16 1925 09/17/16 2014 09/18/16 0411 09/18/16 0727  BP:  (!) 108/50 115/63   Pulse:  80 73   Resp:  18 18   Temp:  97.8 F (36.6 C) 98 F (36.7 C)   TempSrc:  Oral Oral   SpO2: 96% 97% 100% 98%  Weight:   267 lb 11.2 oz (121.4 kg)   Height:        Intake/Output Summary (Last 24 hours) at 09/18/16 0901 Last data filed at 09/17/16 1700  Gross per 24 hour  Intake              480 ml  Output                0 ml  Net              480 ml   Filed Weights   09/16/16 0500 09/17/16 0318 09/18/16 0411  Weight: 267 lb 6.4 oz (121.3 kg) 266 lb 12.8 oz (121 kg) 267 lb 11.2 oz (121.4  kg)    Telemetry    Sinus-I have personally reviewed. Atrial fib overnight  ECG    N/a  Physical Exam   General: Well developed, well nourished, NAD  HEENT: OP clear, mucus membranes moist  SKIN: warm, dry. No rashes. Neuro: No focal deficits  Musculoskeletal: Muscle strength 5/5 all ext  Psychiatric: Mood and affect normal  Neck: No JVD, no carotid bruits, no thyromegaly, no lymphadenopathy.  Lungs:Clear bilaterally, no wheezes, rhonci, crackles Cardiovascular: Regular rate and rhythm. No murmurs, gallops or rubs. Abdomen:Soft. Bowel sounds present. Non-tender.  Extremities: No lower extremity edema. Pulses are 2 + in the bilateral DP/PT.   Labs    Chemistry Recent Labs Lab 09/12/16 0407  09/13/16 0313  09/16/16 0344 09/17/16 0202 09/18/16 0214  NA 136  < > 135  < > 131* 131* 134*  K 3.3*  < > 3.5  < > 2.8* 3.0* 3.8  CL 98*  < >  93*  < > 88* 90* 94*  CO2 29  < > 32  < > 30 29 31   GLUCOSE 141*  < > 126*  < > 112* 156* 117*  BUN 15  < > 24*  < > 32* 30* 23*  CREATININE 1.14  < > 1.43*  < > 1.39* 1.30* 1.23  CALCIUM 8.5*  < > 8.8*  < > 8.6* 8.8* 8.9  PROT 5.8*  --  6.1*  --   --   --   --   ALBUMIN 2.9*  --  2.9*  --   --   --   --   AST 28  --  31  --   --   --   --   ALT 35  --  36  --   --   --   --   ALKPHOS 68  --  107  --   --   --   --   BILITOT 0.8  --  0.6  --   --   --   --   GFRNONAA >60  < > 55*  < > 56* >60 >60  GFRAA >60  < > >60  < > >60 >60 >60  ANIONGAP 9  < > 10  < > 13 12 9   < > = values in this interval not displayed.   Hematology  Recent Labs Lab 09/15/16 0206 09/16/16 0344 09/17/16 0202  WBC 11.9* 9.5 11.4*  RBC 4.07* 4.11* 4.12*  HGB 11.3* 11.5* 11.4*  HCT 34.5* 35.3* 35.9*  MCV 84.8 85.9 87.1  MCH 27.8 28.0 27.7  MCHC 32.8 32.6 31.8  RDW 13.3 13.3 13.5  PLT 240 261 286    Radiology    No results found.  Cardiac Studies   Cardiac cath 09/03/16: Diagnostic Diagram          Patient Profile     51 yo male with  history of HIV, HLD, tobacco abuse, OSA, DM and CAD who was admitted with unstable angina. Cardiac cath with multi-vessel CAD. He is now s/p 3V CABG. He has had paroxysmal atrial fib following CABG.   Assessment & Plan    1. CAD s/p CABG: 9 days post 3V CABG. Doing well. He is on ASA, statin and beta blocker.    2. Atrial fib, post-op: sinus this am. Still having paroxysms of atrial fib. Continue amiodarone and beta blocker. Start NOAC before d/c home. Consider titrating metoprolol as BP tolerates. Will lower ASA to 81 mg since NOAC will be started.     Signed, Lauree Chandler, MD  09/18/2016, 9:01 AM

## 2016-09-18 NOTE — Progress Notes (Addendum)
[address]       [place] [address]             [phone]      9 Days Post-Op Procedure(s) (LRB): CORONARY ARTERY BYPASS GRAFTING times three  using left internal mammary artery and right saphenous vein harvest (N/A) TRANSESOPHAGEAL ECHOCARDIOGRAM (TEE) (N/A) VIDEO BRONCHOSCOPY (N/A) Subjective: Feels good, no afib since about 9 pm yesterday  Objective: Vital signs in last 24 hours: Temp:  [97.7 F (36.5 C)-98 F (36.7 C)] 98 F (36.7 C) (05/30 0411) Pulse Rate:  [73-82] 73 (05/30 0411) Cardiac Rhythm: Normal sinus rhythm (05/30 0242) Resp:  [18] 18 (05/30 0411) BP: (103-126)/(50-63) 115/63 (05/30 0411) SpO2:  [96 %-100 %] 98 % (05/30 0727) FiO2 (%):  [21 %] 21 % (05/30 0727) Weight:  [267 lb 11.2 oz (121.4 kg)] 267 lb 11.2 oz (121.4 kg) (05/30 0411)  Hemodynamic parameters for last 24 hours:    Intake/Output from previous day: 05/29 0701 - 05/30 0700 In: 720 [P.O.:720] Out: -  Intake/Output this shift: No intake/output data recorded.  General appearance: alert, cooperative and no distress Heart: regular rate and rhythm Lungs: clear to auscultation bilaterally Abdomen: benign Extremities: minor edema Wound: incis healing well, right forearm IV site with phlebitis, poss early cellulitis   Lab Results:  Recent Labs  09/16/16 0344 09/17/16 0202  WBC 9.5 11.4*  HGB 11.5* 11.4*  HCT 35.3* 35.9*  PLT 261 286   BMET:  Recent Labs  09/17/16 0202 09/18/16 0214  NA 131* 134*  K 3.0* 3.8  CL 90* 94*  CO2 29 31  GLUCOSE 156* 117*  BUN 30* 23*  CREATININE 1.30* 1.23  CALCIUM 8.8* 8.9    PT/INR:  Recent Labs  09/16/16 0344  LABPROT 13.3  INR 1.01   ABG    Component Value Date/Time   PHART 7.399 09/10/2016 0350   HCO3 20.8 09/10/2016 0350   TCO2 24 09/11/2016 1607   ACIDBASEDEF 3.2 (H) 09/10/2016 0350   O2SAT 94.0 09/10/2016 0350   CBG (last 3)   Recent Labs  09/17/16 1613 09/17/16 2111 09/18/16 0600  GLUCAP  173* 164* 128*    Meds Scheduled Meds: . amiodarone  400 mg Oral Q12H   Followed by  . [START ON 09/21/2016] amiodarone  400 mg Oral Daily  . aspirin EC  325 mg Oral Daily  . budesonide (PULMICORT) nebulizer solution  0.5 mg Nebulization BID  . docusate sodium  200 mg Oral Daily  . dolutegravir  50 mg Oral Daily  . emtricitabine-tenofovir AF  1 tablet Oral Daily  . enoxaparin (LOVENOX) injection  30 mg Subcutaneous Daily  . glimepiride  4 mg Oral Q breakfast  . guaiFENesin  600 mg Oral BID  . insulin aspart  0-24 Units Subcutaneous TID AC & HS  . levalbuterol  1.25 mg Nebulization BID  . metFORMIN  1,000 mg Oral BID WC  . metoprolol tartrate  25 mg Oral BID  . pantoprazole  40 mg Oral QAC breakfast  . potassium chloride  40 mEq Oral TID  . pravastatin  40 mg Oral QPM  . sertraline  50 mg Oral Daily  . sodium chloride flush  3 mL Intravenous Q12H   Continuous Infusions: . sodium chloride     PRN Meds:.sodium chloride, acetaminophen, bisacodyl **OR** bisacodyl, magnesium hydroxide, ondansetron **OR** ondansetron (ZOFRAN) IV, oxyCODONE, sodium chloride flush, traMADol  Xrays No results found.  Assessment/Plan: S/P Procedure(s) (LRB): CORONARY ARTERY BYPASS  GRAFTING times three  using left internal mammary artery and right saphenous vein harvest (N/A) TRANSESOPHAGEAL ECHOCARDIOGRAM (TEE) (N/A) VIDEO BRONCHOSCOPY (N/A) 1 doing well overall 2 will add keflex for phlebitis/cellulitis  3 d/c wires this am 4 NOAC at discharge 5 home soon   LOS: 16 days    GOLD,WAYNE E 09/18/2016 DC EPWs today Home in am on Eliquis patient examined and medical record reviewed,agree with above note. Tharon Aquas Trigt III 09/18/2016

## 2016-09-18 NOTE — Progress Notes (Signed)
CARDIAC REHAB PHASE I   Second attempt to ambulate with pt today. Pt states he just returned from walking for the second time today, independently, declines additional ambulation at this time. Will follow up tomorrow.   Lenna Sciara, RN, BSN 09/18/2016 1:56 PM

## 2016-09-18 NOTE — Progress Notes (Signed)
epw removed per protocol - pt verbalized understanding of one hr bedrest - primary RN updated  Fritz Pickerel, RN

## 2016-09-18 NOTE — Progress Notes (Signed)
Inpatient Diabetes Program Recommendations  AACE/ADA: New Consensus Statement on Inpatient Glycemic Control (2015)  Target Ranges:  Prepandial:   less than 140 mg/dL      Peak postprandial:   less than 180 mg/dL (1-2 hours)      Critically ill patients:  140 - 180 mg/dL   Lab Results  Component Value Date   GLUCAP 116 (H) 09/18/2016   HGBA1C 10.6 (H) 09/06/2016    Review of Glycemic Control Inpatient Diabetes Program Recommendations:   Spoke with pt about A1C results 10.6 (average CBG 258 over the past 2-3 months) with them and explained what an A1C is, basic pathophysiology of DM Type 2, basic home care, basic diabetes diet nutrition principles, importance of checking CBGs and maintaining good CBG control to prevent long-term and short-term complications. Reviewed signs and symptoms of hyperglycemia and hypoglycemia and how to treat hypoglycemia at home. Also reviewed blood sugar goals at home.  RNs to provide ongoing basic DM education at bedside with this patient.  Patient verbalized understanding and appreciated handouts for review. Will follow.  Thank you, Nani Gasser.  Pollman, RN, MSN, CDE  Diabetes Coordinator Inpatient Glycemic Control Team Team Pager [phone] (8am-5pm) 09/18/2016 11:41 AM

## 2016-09-19 LAB — GLUCOSE, CAPILLARY
Glucose-Capillary: 129 mg/dL — ABNORMAL HIGH (ref 65–99)
Glucose-Capillary: 116 mg/dL — ABNORMAL HIGH (ref 65–99)

## 2016-09-19 MED ORDER — GUAIFENESIN ER 600 MG PO TB12: 600.0000 mg | ORAL_TABLET | Freq: Two times a day (BID) | ORAL | 0 refills | Status: AC

## 2016-09-19 MED ORDER — SERTRALINE HCL 50 MG PO TABS: 50.0000 mg | ORAL_TABLET | Freq: Every day | ORAL | 1 refills | Status: DC

## 2016-09-19 MED ORDER — APIXABAN 2.5 MG PO TABS: 2.5000 mg | ORAL_TABLET | Freq: Two times a day (BID) | ORAL | 1 refills | Status: DC

## 2016-09-19 MED ORDER — POTASSIUM CHLORIDE CRYS ER 20 MEQ PO TBCR: 20.0000 meq | EXTENDED_RELEASE_TABLET | Freq: Two times a day (BID) | ORAL | 1 refills | Status: DC

## 2016-09-19 MED ORDER — AMIODARONE HCL 200 MG PO TABS: ORAL_TABLET | ORAL | 1 refills | Status: DC

## 2016-09-19 MED ORDER — BUDESONIDE 0.5 MG/2ML IN SUSP: 0.5000 mg | Freq: Two times a day (BID) | RESPIRATORY_TRACT | 12 refills | Status: DC

## 2016-09-19 MED ORDER — ASPIRIN EC 81 MG PO TBEC: 81.0000 mg | DELAYED_RELEASE_TABLET | Freq: Every day | ORAL | Status: DC

## 2016-09-19 MED ORDER — OXYCODONE HCL 5 MG PO TABS: 5.0000 mg | ORAL_TABLET | Freq: Four times a day (QID) | ORAL | 0 refills | Status: DC | PRN

## 2016-09-19 MED ORDER — METOPROLOL TARTRATE 25 MG PO TABS: 25.0000 mg | ORAL_TABLET | Freq: Two times a day (BID) | ORAL | 1 refills | Status: DC

## 2016-09-19 MED ORDER — CEPHALEXIN 500 MG PO CAPS: 500.0000 mg | ORAL_CAPSULE | Freq: Three times a day (TID) | ORAL | 0 refills | Status: AC

## 2016-09-19 NOTE — Progress Notes (Signed)
CARDIAC REHAB PHASE I   Pt states he is ambulating independently with no complaints, declines ambulation with cardiac rehab at this time. Cardiac surgery discharge education completed. Reviewed risk factors, tobacco cessation, IS, sternal precautions, activity progression, exercise, heart healthy and diabetes diet handouts and phase 2 cardiac rehab. Pt verbalized understanding. Pt agrees to phase 2 cardiac rehab referral, will send to [facility]. Pt in bed, call bell within reach.    [id number] Lenna Sciara, RN, BSN 09/19/2016 10:17 AM

## 2016-09-19 NOTE — Progress Notes (Signed)
Chest tube sutures removed X2. Discharge teaching complete and copy of instructions given to patient. Patient discharged home with friend by wheelchair accompanied by hospital volunteer.

## 2016-09-19 NOTE — Discharge Instructions (Signed)
Endoscopic Saphenous Vein Harvesting, Care After  Refer to this sheet in the next few weeks. These instructions provide you with information about caring for yourself after your procedure. Your health care provider may also give you more specific instructions. Your treatment has been planned according to current medical practices, but problems sometimes occur. Call your health care provider if you have any problems or questions after your procedure. What can I expect after the procedure? After the procedure, it is common to have: Pain. Bruising. Swelling. Numbness. Follow these instructions at home: Medicine  Take over-the-counter and prescription medicines only as told by your health care provider. Do not drive or operate heavy machinery while taking prescription pain medicine. Incision care   Follow instructions from your health care provider about how to take care of the cut made during surgery (incision). Make sure you: Wash your hands with soap and water before you change your bandage (dressing). If soap and water are not available, use hand sanitizer. Change your dressing as told by your health care provider. Leave stitches (sutures), skin glue, or adhesive strips in place. These skin closures may need to be in place for 2 weeks or longer. If adhesive strip edges start to loosen and curl up, you may trim the loose edges. Do not remove adhesive strips completely unless your health care provider tells you to do that. Check your incision area every day for signs of infection. Check for: More redness, swelling, or pain. More fluid or blood. Warmth. Pus or a bad smell. General instructions  Raise (elevate) your legs above the level of your heart while you are sitting or lying down. Do any exercises your health care providers have given you. These may include deep breathing, coughing, and walking exercises. Do not shower, take baths, swim, or use a hot tub unless told by your health care  provider. Wear your elastic stocking if told by your health care provider. Keep all follow-up visits as told by your health care provider. This is important. Contact a health care provider if: Medicine does not help your pain. Your pain gets worse. You have new leg bruises or your leg bruises get bigger. You have a fever. Your leg feels numb. You have more redness, swelling, or pain around your incision. You have more fluid or blood coming from your incision. Your incision feels warm to the touch. You have pus or a bad smell coming from your incision. Get help right away if: Your pain is severe. You develop pain, tenderness, warmth, redness, or swelling in any part of your leg. You have chest pain. You have trouble breathing. This information is not intended to replace advice given to you by your health care provider. Make sure you discuss any questions you have with your health care provider. Document Released: 12/19/2010 Document Revised: 09/14/2015 Document Reviewed: 02/20/2015 Elsevier Interactive Patient Education  2017 [employer] Coronary Artery Bypass Grafting, Care After These instructions give you information on caring for yourself after your procedure. Your doctor may also give you more specific instructions. Call your doctor if you have any problems or questions after your procedure. Follow these instructions at home:  Only take medicine as told by your doctor. Take medicines exactly as told. Do not stop taking medicines or start any new medicines without talking to your doctor first.  Take your pulse as told by your doctor.  Do deep breathing as told by your doctor. Use your breathing device (incentive spirometer), if given, to practice deep breathing  several times a day. Support your chest with a pillow or your arms when you take deep breaths or cough.  Keep the area clean, dry, and protected where the surgery cuts (incisions) were made. Remove bandages (dressings)  only as told by your doctor. If strips were applied to surgical area, do not take them off. They fall off on their own.  Check the surgery area daily for puffiness (swelling), redness, or leaking fluid.  If surgery cuts were made in your legs:  Avoid crossing your legs.  Avoid sitting for long periods of time. Change positions every 30 minutes.  Raise your legs when you are sitting. Place them on pillows.  Wear stockings that help keep blood clots from forming in your legs (compression stockings).  Only take sponge baths until your doctor says it is okay to take showers. Pat the surgery area dry. Do not rub the surgery area with a washcloth or towel. Do not bathe, swim, or use a hot tub until your doctor says it is okay.  Eat foods that are high in fiber. These include raw fruits and vegetables, whole grains, beans, and nuts. Choose lean meats. Avoid canned, processed, and fried foods.  Drink enough fluids to keep your pee (urine) clear or pale yellow.  Weigh yourself every day.  Rest and limit activity as told by your doctor. You may be told to:  Stop any activity if you have chest pain, shortness of breath, changes in heartbeat, or dizziness. Get help right away if this happens.  Move around often for short amounts of time or take short walks as told by your doctor. Gradually become more active. You may need help to strengthen your muscles and build endurance.  Avoid lifting, pushing, or pulling anything heavier than 10 pounds (4.5 kg) for at least 6 weeks after surgery.  Do not drive until your doctor says it is okay.  Ask your doctor when you can go back to work.  Ask your doctor when you can begin sexual activity again.  Follow up with your doctor as told. Contact a doctor if:  You have puffiness, redness, more pain, or fluid draining from the incision site.  You have a fever.  You have puffiness in your ankles or legs.  You have pain in your legs.  You gain 2 or  more pounds (0.9 kg) a day.  You feel sick to your stomach (nauseous) or throw up (vomit).  You have watery poop (diarrhea). Get help right away if:  You have chest pain that goes to your jaw or arms.  You have shortness of breath.  You have a fast or irregular heartbeat.  You notice a "clicking" in your breastbone when you move.  You have numbness or weakness in your arms or legs.  You feel dizzy or light-headed. This information is not intended to replace advice given to you by your health care provider. Make sure you discuss any questions you have with your health care provider. Document Released: 04/13/2013 Document Revised: 09/14/2015 Document Reviewed: 09/15/2012 Elsevier Interactive Patient Education  2017 [employer].

## 2016-09-19 NOTE — Progress Notes (Signed)
      [address]       [place] [address]             [phone]      10 Days Post-Op Procedure(s) (LRB): CORONARY ARTERY BYPASS GRAFTING times three  using left internal mammary artery and right saphenous vein harvest (N/A) TRANSESOPHAGEAL ECHOCARDIOGRAM (TEE) (N/A) VIDEO BRONCHOSCOPY (N/A) Subjective: No issues this morning. Feels good.   Objective: Vital signs in last 24 hours: Temp:  [97.5 F (36.4 C)-98.3 F (36.8 C)] 98.3 F (36.8 C) (05/31 0445) Pulse Rate:  [70-79] 75 (05/31 0445) Cardiac Rhythm: Normal sinus rhythm (05/30 2007) Resp:  [18] 18 (05/31 0445) BP: (95-117)/(45-62) 112/62 (05/31 0445) SpO2:  [97 %-100 %] 100 % (05/31 0445) Weight:  [121.5 kg (267 lb 12.8 oz)] 121.5 kg (267 lb 12.8 oz) (05/31 0445)     Intake/Output from previous day: 05/30 0701 - 05/31 0700 In: 1080 [P.O.:1080] Out: -  Intake/Output this shift: No intake/output data recorded.  General appearance: alert, cooperative and no distress Heart: regular rate and rhythm, S1, S2 normal, no murmur, click, rub or gallop Lungs: clear to auscultation bilaterally Abdomen: soft, non-tender; bowel sounds normal; no masses,  no organomegaly Extremities: extremities normal, atraumatic, no cyanosis or edema Wound: clean and dry  Lab Results:  Recent Labs  09/17/16 0202  WBC 11.4*  HGB 11.4*  HCT 35.9*  PLT 286   BMET:  Recent Labs  09/17/16 0202 09/18/16 0214  NA 131* 134*  K 3.0* 3.8  CL 90* 94*  CO2 29 31  GLUCOSE 156* 117*  BUN 30* 23*  CREATININE 1.30* 1.23  CALCIUM 8.8* 8.9    PT/INR: No results for input(s): LABPROT, INR in the last 72 hours. ABG    Component Value Date/Time   PHART 7.399 09/10/2016 0350   HCO3 20.8 09/10/2016 0350   TCO2 24 09/11/2016 1607   ACIDBASEDEF 3.2 (H) 09/10/2016 0350   O2SAT 94.0 09/10/2016 0350   CBG (last 3)   Recent Labs  09/18/16 1656 09/18/16 2126 09/19/16 0621  GLUCAP 88 114* 129*     Assessment/Plan: S/P Procedure(s) (LRB): CORONARY ARTERY BYPASS GRAFTING times three  using left internal mammary artery and right saphenous vein harvest (N/A) TRANSESOPHAGEAL ECHOCARDIOGRAM (TEE) (N/A) VIDEO BRONCHOSCOPY (N/A)  1. CV-NSR in the 34s. BP well controlled. Tolerating oral Amio. On Lopressor and statin. Eliquis at discharge with ASA 81. Wires removed. 2. Pulm- tolerating room air. Last CXR stable 3. Renal-creatinine trending down. Electrolytes okay.  4. H and H stable. Expected acute blood loss anemia 5. Endo-blood glucose level moderately controlled.  6. Continue Keflex for phlebitis/cellulitis  Plan: home today.     LOS: 17 days    Elgie Collard 09/19/2016

## 2016-09-19 NOTE — Care Management Note (Signed)
Case Management Note  Patient Details  Name: ALERIC FROELICH MRN: [id number] Date of Birth: 1965-07-13  Subjective/Objective:                 Patient with orders to DC to home. Provided with Eliquis 30 day and $10 copay card prior to DC. No other CM needs identified at this time.    Action/Plan:   Expected Discharge Date:  09/19/16               Expected Discharge Plan:  Home/Self Care  In-House Referral:     Discharge planning Services  CM Consult  Post Acute Care Choice:    Choice offered to:     DME Arranged:    DME Agency:     HH Arranged:    HH Agency:     Status of Service:  Completed, signed off  If discussed at [employer] of Stay Meetings, dates discussed:    Additional Comments:  Carles Collet, RN 09/19/2016, 1:34 PM

## 2016-09-25 ENCOUNTER — Telehealth (HOSPITAL_COMMUNITY): Payer: Self-pay

## 2016-09-25 NOTE — Telephone Encounter (Signed)
Verified [employer] benefits through Passport. No Copay, Coinsurance 20%, Deductible $500.00, pt has met $500.00 Out of Pocket $2000.00, pt has met $2000.00. Reference [phone].Marland KitchenMarland KitchenMarland KitchenMarland Kitchen KJ

## 2016-10-01 ENCOUNTER — Encounter: Payer: Self-pay | Admitting: Physician Assistant

## 2016-10-01 DIAGNOSIS — I480 Paroxysmal atrial fibrillation: Secondary | ICD-10-CM | POA: Insufficient documentation

## 2016-10-01 DIAGNOSIS — I2510 Atherosclerotic heart disease of native coronary artery without angina pectoris: Secondary | ICD-10-CM | POA: Insufficient documentation
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
## 2016-10-02 ENCOUNTER — Ambulatory Visit (INDEPENDENT_AMBULATORY_CARE_PROVIDER_SITE_OTHER): Payer: 59 | Admitting: Physician Assistant

## 2016-10-02 ENCOUNTER — Encounter: Payer: Self-pay | Admitting: Physician Assistant

## 2016-10-02 ENCOUNTER — Encounter (INDEPENDENT_AMBULATORY_CARE_PROVIDER_SITE_OTHER): Payer: Self-pay

## 2016-10-02 VITALS — BP 116/68 | HR 67 | Ht 69.0 in | Wt 261.0 lb

## 2016-10-02 DIAGNOSIS — E042 Nontoxic multinodular goiter: Secondary | ICD-10-CM

## 2016-10-02 DIAGNOSIS — I2510 Atherosclerotic heart disease of native coronary artery without angina pectoris: Secondary | ICD-10-CM | POA: Diagnosis not present

## 2016-10-02 DIAGNOSIS — N179 Acute kidney failure, unspecified: Secondary | ICD-10-CM | POA: Diagnosis not present

## 2016-10-02 DIAGNOSIS — I480 Paroxysmal atrial fibrillation: Secondary | ICD-10-CM

## 2016-10-02 DIAGNOSIS — Z951 Presence of aortocoronary bypass graft: Secondary | ICD-10-CM

## 2016-10-02 LAB — TSH: TSH: 1.9 u[IU]/mL (ref 0.450–4.500)

## 2016-10-02 LAB — BASIC METABOLIC PANEL
BUN/Creatinine Ratio: 12 (ref 9–20)
BUN: 15 mg/dL (ref 6–24)
CALCIUM: 9.4 mg/dL (ref 8.7–10.2)
CHLORIDE: 103 mmol/L (ref 96–106)
CO2: 20 mmol/L (ref 20–29)
Creatinine, Ser: 1.24 mg/dL (ref 0.76–1.27)
GFR calc non Af Amer: 66 mL/min/{1.73_m2} (ref >59)
GFR, EST AFRICAN AMERICAN: 76 mL/min/{1.73_m2} (ref >59)
GLUCOSE: 63 mg/dL — AB (ref 65–99)
POTASSIUM: 4.7 mmol/L (ref 3.5–5.2)
Sodium: 137 mmol/L (ref 134–144)

## 2016-10-02 LAB — T4, FREE: Free T4: 1.8 ng/dL — ABNORMAL HIGH (ref 0.82–1.77)

## 2016-10-02 NOTE — Patient Instructions (Signed)
Medication Instructions:  Your physician recommends that you continue on your current medications as directed. Please refer to the Current Medication list given to you today.   Labwork: Your physician recommends that you return for lab work today for BMET, TSH, FREE T4  Testing/Procedures: None Ordered   Follow-Up: Your physician recommends that you schedule a follow-up appointment in: 3-4 weeks with Dr. Angelena Form or APP  Any Other Special Instructions Will Be Listed Below (If Applicable).     If you need a refill on your cardiac medications before your next appointment, please call your pharmacy.  Thank you for choosing [facility]

## 2016-10-03 ENCOUNTER — Telehealth: Payer: Self-pay | Admitting: Nurse Practitioner

## 2016-10-03 MED ORDER — POTASSIUM CHLORIDE ER 20 MEQ PO TBCR: 20.0000 meq | EXTENDED_RELEASE_TABLET | Freq: Every day | ORAL | 3 refills | Status: DC

## 2016-10-03 NOTE — Telephone Encounter (Signed)
Reviewed lab results and plan of care with patient. He states he does monitor blood sugar and eat regularly. I have changed his medication list to reflect Kdur 20 mEq once daily. He verbalized agreement to follow up with PCP regarding thyroid results. He thanked me for the call.

## 2016-10-03 NOTE — Telephone Encounter (Signed)
-----   Message from Charlie Pitter, [place] sent at 10/02/2016  4:59 PM EDT ----- Please let patient know labs are stable except the following: 1) blood sugar was actually on the lower side so please make sure to eat regular meals, check blood sugar regularly, and call primary care doctor to discuss low reading. 2) renal function has further improved from recent hospital stay, potassium function is upper end of normal. Would decrease potassium to once a day since he is only taking Lasix once a day.  3) needs to f/u PCP for thyroid issue as TSH was normal but free T4 was marginally elevated. Dayna Dunn PA-C

## 2016-10-07 ENCOUNTER — Other Ambulatory Visit: Payer: Self-pay | Admitting: Cardiothoracic Surgery

## 2016-10-07 ENCOUNTER — Other Ambulatory Visit: Payer: Self-pay | Admitting: Thoracic Surgery (Cardiothoracic Vascular Surgery)

## 2016-10-07 DIAGNOSIS — Z951 Presence of aortocoronary bypass graft: Secondary | ICD-10-CM

## 2016-10-08 ENCOUNTER — Ambulatory Visit
Admission: RE | Admit: 2016-10-08 | Discharge: 2016-10-08 | Disposition: A | Discharge status: Home / Self Care | Payer: 59 | Source: Ambulatory Visit | Attending: Cardiothoracic Surgery | Admitting: Cardiothoracic Surgery

## 2016-10-08 ENCOUNTER — Encounter: Payer: Self-pay | Admitting: Cardiothoracic Surgery

## 2016-10-08 ENCOUNTER — Ambulatory Visit (INDEPENDENT_AMBULATORY_CARE_PROVIDER_SITE_OTHER): Payer: Self-pay | Admitting: Cardiothoracic Surgery

## 2016-10-08 VITALS — BP 97/65 | HR 80 | Resp 18 | Ht 69.0 in | Wt 260.0 lb

## 2016-10-08 DIAGNOSIS — Z951 Presence of aortocoronary bypass graft: Secondary | ICD-10-CM

## 2016-10-08 LAB — FUNGAL ORGANISM REFLEX

## 2016-10-08 LAB — FUNGUS CULTURE WITH STAIN

## 2016-10-08 LAB — FUNGUS CULTURE RESULT
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
## 2016-10-12 ENCOUNTER — Telehealth (HOSPITAL_COMMUNITY): Payer: Self-pay

## 2016-10-12 NOTE — Telephone Encounter (Signed)
Cardiac Rehab Medication Review by a Pharmacist  Does the patient  feel that his/her medications are working for him/her? Yes  Has the patient been experiencing any side effects to the medications prescribed? No   Does the patient measure his/her own blood pressure or blood glucose at home?  Yes  Does the patient have any problems obtaining medications due to transportation or finances?  No    Understanding of regimen: Excellent  Understanding of indications: Excellent  Potential of compliance: Excellent    Pharmacist comments:  Patient presents ambulating unassisted and in good spirits today. Medication indications, dosing, frequency, and notable side effects reviewed with patient. Patient did not verbalize any concerns regarding his medications. Time offered for discussion with questions. No further questions or concerns at conclusion of our visit.   Argie Ramming, PharmD Pharmacy Resident  Pager [phone] 06/04/16 8:02 AM

## 2016-10-15 ENCOUNTER — Encounter (HOSPITAL_COMMUNITY)
Admission: RE | Admit: 2016-10-15 | Discharge: 2016-10-15 | Disposition: A | Discharge status: Home / Self Care | Payer: 59 | Source: Ambulatory Visit | Attending: Cardiovascular Disease | Admitting: Cardiovascular Disease

## 2016-10-15 ENCOUNTER — Encounter (HOSPITAL_COMMUNITY): Payer: Self-pay

## 2016-10-15 VITALS — BP 96/60 | HR 77 | Ht 69.0 in | Wt 261.7 lb

## 2016-10-15 DIAGNOSIS — Z48812 Encounter for surgical aftercare following surgery on the circulatory system: Secondary | ICD-10-CM | POA: Insufficient documentation

## 2016-10-15 DIAGNOSIS — I214 Non-ST elevation (NSTEMI) myocardial infarction: Secondary | ICD-10-CM | POA: Diagnosis present

## 2016-10-15 DIAGNOSIS — Z951 Presence of aortocoronary bypass graft: Secondary | ICD-10-CM | POA: Diagnosis present
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
## 2016-10-16 NOTE — Progress Notes (Signed)
[name] 51 y.o. male       Nutrition Screen & Note  1. 09/03/16 NSTEMI (non-ST elevated myocardial infarction) ([place])   2. 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 Asthma   . Coronary artery disease    a. NSTEMI s/p DES to Cx in 2012. b. NSTEMI 08/2016 s/p CABG  . Diabetes mellitus type 2 with complications ([place])   . Hepatitis B   . HIV (human immunodeficiency virus infection) ([place])    on therapy  . HLD (hyperlipidemia)   . Hypertension   . MI (myocardial infarction) ([place])   . Multiple thyroid nodules   . Renal stone 10/2013  . Sleep apnea    does not wear cpap  . Subdural hematoma ([place])   . Tobacco abuse   . Ulcerative colitis ([place])    Meds reviewed. Glimepiride, Metformin noted  HT: Ht Readings from Last 1 Encounters:  10/15/16 5\' 9"  (1.753 m)    WT: Wt Readings from Last 3 Encounters:  10/15/16 261 lb 11 oz (118.7 kg)  10/08/16 260 lb (117.9 kg)  10/02/16 261 lb (118.4 kg)     BMI 38.7   Current tobacco use? No; Recently quit tobacco use 09/03/16  Labs:  Lipid Panel     Component Value Date/Time   CHOL 116 09/08/2016 0249   TRIG 189 (H) 09/08/2016 0249   HDL 28 (L) 09/08/2016 0249   CHOLHDL 4.1 09/08/2016 0249   VLDL 38 09/08/2016 0249   LDLCALC 50 09/08/2016 0249   LDLDIRECT 74.0 04/12/2011 0856    Lab Results  Component Value Date   HGBA1C 10.6 (H) 09/06/2016   CBG (last 3)  No results for input(s): GLUCAP in the last 72 hours.   Nutrition Note Spoke with pt. Nutrition Plan and Nutrition Survey goals reviewed with pt. Pt is following Step 2 of the Therapeutic Lifestyle Changes diet. Pt wants to lose wt. Pt has lost 22 lb over the past 6 weeks. Pt's pre-hospital wt noted to be 281 lb. Pt's current wt is down ~ 20 lb over the past 2 months. Wt loss slightly greater than desired rate. Pt c/o decreased appetite. Pt's poor appetite discussed. Pt is diabetic. Last A1c indicates blood glucose poorly controlled. Per discussion, pt  believed his last A1c was 8.0 and stated his MD added Glimepiride before MI. Pt expressed understanding of the information reviewed. Pt aware of nutrition education classes offered and is unable to attend nutrition classes due to pt returned to work yesterday.  Nutrition Diagnosis ? Food-and nutrition-related knowledge deficit related to lack of exposure to information as related to diagnosis of: ? CVD ? DM ? Obesity related to excessive energy intake as evidenced by a BMI of 38.7  Nutrition Intervention ? Pt's individual nutrition plan reviewed with pt. ? Continue client-centered nutrition education by RD, as part of interdisciplinary care.  Nutrition Goal(s):  ? Improved glycemic control as evidenced by an A1c trending toward 7.0 or less ? Pt to identify food quantities necessary to achieve weight loss of 6-24 lb (2.7-10.9 kg) at graduation from cardiac rehab. Long-term goal wt of 200 lb desired.   Plan:  Will provide client-centered nutrition education as part of interdisciplinary care.   Monitor and evaluate progress toward nutrition goal with team.  Derek Mound, M.Ed, RD, LDN, CDE 10/16/2016 8:30 AM

## 2016-10-21 ENCOUNTER — Encounter (HOSPITAL_COMMUNITY): Payer: Self-pay

## 2016-10-21 ENCOUNTER — Encounter (HOSPITAL_COMMUNITY)
Admission: RE | Admit: 2016-10-21 | Discharge: 2016-10-21 | Disposition: A | Discharge status: Home / Self Care | Payer: 59 | Source: Ambulatory Visit | Attending: Internal Medicine | Admitting: Internal Medicine

## 2016-10-21 DIAGNOSIS — Z951 Presence of aortocoronary bypass graft: Secondary | ICD-10-CM | POA: Diagnosis present

## 2016-10-21 DIAGNOSIS — Z48812 Encounter for surgical aftercare following surgery on the circulatory system: Secondary | ICD-10-CM | POA: Diagnosis present

## 2016-10-21 DIAGNOSIS — I214 Non-ST elevation (NSTEMI) myocardial infarction: Secondary | ICD-10-CM | POA: Diagnosis present

## 2016-10-21 LAB — GLUCOSE, CAPILLARY
GLUCOSE-CAPILLARY: 132 mg/dL — AB (ref 65–99)
Glucose-Capillary: 167 mg/dL — ABNORMAL HIGH (ref 65–99)

## 2016-10-21 NOTE — Progress Notes (Signed)
Daily Session Note  Patient Details  Name: [name] MRN: [id number] Date of Birth: 17-Apr-1966 Referring Provider:     CARDIAC REHAB PHASE II ORIENTATION from 10/15/2016 in [facility]  Referring Provider  Lauree Chandler MD      Encounter Date: 10/21/2016  Check In:     Session Check In - 10/21/16 0713      Check-In   Location [facility]   Staff Present Su Hilt, MS, ACSM RCEP, Exercise Physiologist;Ashley Armstrong, MS, Exercise Physiologist;Erik Nessel, RN, BSN   Supervising physician immediately available to respond to emergencies [facility] immediately available   Physician(s) Dr. Allyson Sabal   Medication changes reported     No   Fall or balance concerns reported    No   Tobacco Cessation No Change   Warm-up and Cool-down Performed as group-led instruction   Resistance Training Performed Yes   VAD Patient? No     Pain Assessment   Currently in Pain? No/denies   Multiple Pain Sites No      Capillary Blood Glucose: No results found for this or any previous visit (from the past 24 hour(s)).    History  Smoking Status  . Former Smoker  . Packs/day: 1.00  . Years: 30.00  . Types: Cigarettes  . Quit date: 09/03/2016  Smokeless Tobacco  . Never Used    Goals Met:  Exercise tolerated well  Goals Unmet:  Not Applicable  Comments: Pt started cardiac rehab today.  Pt tolerated light exercise without difficulty. VSS, telemetry-sinus rhythm, asymptomatic.  Medication list reconciled. Pt denies barriers to medicaiton compliance.  PSYCHOSOCIAL ASSESSMENT:  PHQ-0. Pt exhibits positive coping skills, hopeful outlook with supportive family. No psychosocial needs identified at this time, no psychosocial interventions necessary.    Pt enjoys international travel with his favorite destination being [place].  Pt goals for cardiac rehab are to lose weight and improve cardiopulmonary health. Pt encouraged to participate  in exercise and nutrition cardiac rehab activities to increase success with meeting these goals.    Pt oriented to exercise equipment and routine.    Understanding verbalized.   Dr. Fransico Him is Medical Director for Cardiac Rehab at [facility].

## 2016-10-22 ENCOUNTER — Encounter: Payer: Self-pay | Admitting: Physician Assistant

## 2016-10-22 DIAGNOSIS — N182 Chronic kidney disease, stage 2 (mild): Secondary | ICD-10-CM | POA: Insufficient documentation
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
## 2016-10-23 LAB — ACID FAST CULTURE WITH REFLEXED SENSITIVITIES (MYCOBACTERIA): Acid Fast Culture: NEGATIVE

## 2016-10-25 ENCOUNTER — Telehealth: Payer: Self-pay | Admitting: Physician Assistant

## 2016-10-25 ENCOUNTER — Ambulatory Visit (INDEPENDENT_AMBULATORY_CARE_PROVIDER_SITE_OTHER): Payer: 59 | Admitting: Physician Assistant

## 2016-10-25 ENCOUNTER — Encounter: Payer: Self-pay | Admitting: Physician Assistant

## 2016-10-25 ENCOUNTER — Encounter (HOSPITAL_COMMUNITY)
Admission: RE | Admit: 2016-10-25 | Discharge: 2016-10-25 | Disposition: A | Discharge status: Home / Self Care | Payer: 59 | Source: Ambulatory Visit | Attending: Internal Medicine | Admitting: Internal Medicine

## 2016-10-25 VITALS — BP 120/68 | HR 80 | Ht 69.0 in | Wt 265.0 lb

## 2016-10-25 DIAGNOSIS — I2510 Atherosclerotic heart disease of native coronary artery without angina pectoris: Secondary | ICD-10-CM

## 2016-10-25 DIAGNOSIS — Z951 Presence of aortocoronary bypass graft: Secondary | ICD-10-CM

## 2016-10-25 DIAGNOSIS — N182 Chronic kidney disease, stage 2 (mild): Secondary | ICD-10-CM

## 2016-10-25 DIAGNOSIS — I10 Essential (primary) hypertension: Secondary | ICD-10-CM

## 2016-10-25 DIAGNOSIS — I480 Paroxysmal atrial fibrillation: Secondary | ICD-10-CM

## 2016-10-25 DIAGNOSIS — I214 Non-ST elevation (NSTEMI) myocardial infarction: Secondary | ICD-10-CM

## 2016-10-25 DIAGNOSIS — Z48812 Encounter for surgical aftercare following surgery on the circulatory system: Secondary | ICD-10-CM | POA: Diagnosis not present

## 2016-10-25 LAB — GLUCOSE, CAPILLARY
GLUCOSE-CAPILLARY: 194 mg/dL — AB (ref 65–99)
GLUCOSE-CAPILLARY: 133 mg/dL — AB (ref 65–99)

## 2016-10-25 MED ORDER — ASPIRIN EC 325 MG PO TBEC: 325.0000 mg | DELAYED_RELEASE_TABLET | Freq: Every day | ORAL | 0 refills | Status: DC

## 2016-10-25 NOTE — Telephone Encounter (Signed)
Please call patient. I just saw him this AM, visit went well - we stopped Eliquis and amiodarone since he is in normal rhythm. I called Dr. Angelena Form to ask him his specific recommendations on the aspirin dose since we are stopping Eliquis to see if Dr. Angelena Form wanted to go up to a full dose aspirin - Dr. Angelena Form agrees that he should increase aspirin to 325mg  daily. Can you call patient and make the change on med list? Thanks! Dayna Dunn PA-C

## 2016-10-25 NOTE — Patient Instructions (Addendum)
Medication Instructions: Your physician has recommended you make the following change in your medication:  -1) STOP Amiodarone (Pacerone) -2) STOP Eliquis  Labwork: - None Ordered  Procedures/Testing: - None Ordered  Follow-Up: -Your physician recommends that you schedule a follow-up appointment in 3 MONTHS with Dr. Angelena Form.   If you need a refill on your cardiac medications before your next appointment, please call your pharmacy.

## 2016-10-25 NOTE — Telephone Encounter (Signed)
Called pt to let him know to increase his Aspirin to 325 since they dc'd the Eliquis today. Pt verbalized understanding.

## 2016-10-28 ENCOUNTER — Encounter (HOSPITAL_COMMUNITY)
Admission: RE | Admit: 2016-10-28 | Discharge: 2016-10-28 | Disposition: A | Discharge status: Home / Self Care | Payer: 59 | Source: Ambulatory Visit | Attending: Internal Medicine | Admitting: Internal Medicine

## 2016-10-28 DIAGNOSIS — Z48812 Encounter for surgical aftercare following surgery on the circulatory system: Secondary | ICD-10-CM | POA: Diagnosis not present

## 2016-10-28 DIAGNOSIS — I214 Non-ST elevation (NSTEMI) myocardial infarction: Secondary | ICD-10-CM

## 2016-10-28 DIAGNOSIS — Z951 Presence of aortocoronary bypass graft: Secondary | ICD-10-CM

## 2016-10-28 LAB — GLUCOSE, CAPILLARY: Glucose-Capillary: 125 mg/dL — ABNORMAL HIGH (ref 65–99)

## 2016-10-30 ENCOUNTER — Encounter (HOSPITAL_COMMUNITY)
Admission: RE | Admit: 2016-10-30 | Discharge: 2016-10-30 | Disposition: A | Discharge status: Home / Self Care | Payer: 59 | Source: Ambulatory Visit | Attending: Internal Medicine | Admitting: Internal Medicine

## 2016-10-30 DIAGNOSIS — Z48812 Encounter for surgical aftercare following surgery on the circulatory system: Secondary | ICD-10-CM | POA: Diagnosis not present

## 2016-10-30 DIAGNOSIS — Z951 Presence of aortocoronary bypass graft: Secondary | ICD-10-CM

## 2016-10-30 DIAGNOSIS — I214 Non-ST elevation (NSTEMI) myocardial infarction: Secondary | ICD-10-CM

## 2016-11-01 ENCOUNTER — Encounter (HOSPITAL_COMMUNITY)
Admission: RE | Admit: 2016-11-01 | Discharge: 2016-11-01 | Disposition: A | Discharge status: Home / Self Care | Payer: 59 | Source: Ambulatory Visit | Attending: Internal Medicine | Admitting: Internal Medicine

## 2016-11-01 DIAGNOSIS — Z951 Presence of aortocoronary bypass graft: Secondary | ICD-10-CM

## 2016-11-01 DIAGNOSIS — Z48812 Encounter for surgical aftercare following surgery on the circulatory system: Secondary | ICD-10-CM | POA: Diagnosis not present

## 2016-11-01 DIAGNOSIS — I214 Non-ST elevation (NSTEMI) myocardial infarction: Secondary | ICD-10-CM

## 2016-11-04 ENCOUNTER — Encounter (HOSPITAL_COMMUNITY)
Admission: RE | Admit: 2016-11-04 | Discharge: 2016-11-04 | Disposition: A | Discharge status: Home / Self Care | Payer: 59 | Source: Ambulatory Visit | Attending: Internal Medicine | Admitting: Internal Medicine

## 2016-11-04 DIAGNOSIS — Z48812 Encounter for surgical aftercare following surgery on the circulatory system: Secondary | ICD-10-CM | POA: Diagnosis not present

## 2016-11-04 DIAGNOSIS — Z951 Presence of aortocoronary bypass graft: Secondary | ICD-10-CM

## 2016-11-04 DIAGNOSIS — I214 Non-ST elevation (NSTEMI) myocardial infarction: Secondary | ICD-10-CM
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
## 2016-11-06 ENCOUNTER — Encounter (HOSPITAL_COMMUNITY)
Admission: RE | Admit: 2016-11-06 | Discharge: 2016-11-06 | Disposition: A | Discharge status: Home / Self Care | Payer: 59 | Source: Ambulatory Visit | Attending: Internal Medicine | Admitting: Internal Medicine

## 2016-11-06 DIAGNOSIS — I214 Non-ST elevation (NSTEMI) myocardial infarction: Secondary | ICD-10-CM

## 2016-11-06 DIAGNOSIS — Z951 Presence of aortocoronary bypass graft: Secondary | ICD-10-CM

## 2016-11-08 ENCOUNTER — Encounter (HOSPITAL_COMMUNITY)
Admission: RE | Admit: 2016-11-08 | Discharge: 2016-11-08 | Disposition: A | Discharge status: Home / Self Care | Payer: 59 | Source: Ambulatory Visit | Attending: Internal Medicine | Admitting: Internal Medicine

## 2016-11-08 DIAGNOSIS — Z951 Presence of aortocoronary bypass graft: Secondary | ICD-10-CM

## 2016-11-08 DIAGNOSIS — I214 Non-ST elevation (NSTEMI) myocardial infarction: Secondary | ICD-10-CM

## 2016-11-08 DIAGNOSIS — Z48812 Encounter for surgical aftercare following surgery on the circulatory system: Secondary | ICD-10-CM | POA: Diagnosis not present

## 2016-11-11 ENCOUNTER — Encounter (HOSPITAL_COMMUNITY)
Admission: RE | Admit: 2016-11-11 | Discharge: 2016-11-11 | Disposition: A | Discharge status: Home / Self Care | Payer: 59 | Source: Ambulatory Visit | Attending: Internal Medicine | Admitting: Internal Medicine

## 2016-11-11 DIAGNOSIS — Z48812 Encounter for surgical aftercare following surgery on the circulatory system: Secondary | ICD-10-CM | POA: Diagnosis not present

## 2016-11-11 DIAGNOSIS — I214 Non-ST elevation (NSTEMI) myocardial infarction: Secondary | ICD-10-CM

## 2016-11-11 DIAGNOSIS — Z951 Presence of aortocoronary bypass graft: Secondary | ICD-10-CM

## 2016-11-12 ENCOUNTER — Other Ambulatory Visit: Payer: Self-pay | Admitting: Physician Assistant

## 2016-11-13 ENCOUNTER — Encounter (HOSPITAL_COMMUNITY)
Admission: RE | Admit: 2016-11-13 | Discharge: 2016-11-13 | Disposition: A | Discharge status: Home / Self Care | Payer: 59 | Source: Ambulatory Visit | Attending: Internal Medicine | Admitting: Internal Medicine

## 2016-11-13 DIAGNOSIS — Z48812 Encounter for surgical aftercare following surgery on the circulatory system: Secondary | ICD-10-CM | POA: Diagnosis not present

## 2016-11-13 DIAGNOSIS — I214 Non-ST elevation (NSTEMI) myocardial infarction: Secondary | ICD-10-CM

## 2016-11-13 DIAGNOSIS — Z951 Presence of aortocoronary bypass graft: Secondary | ICD-10-CM

## 2016-11-15 ENCOUNTER — Encounter (HOSPITAL_COMMUNITY): Payer: 59

## 2016-11-18 ENCOUNTER — Encounter (HOSPITAL_COMMUNITY): Payer: 59

## 2016-11-20 ENCOUNTER — Encounter (HOSPITAL_COMMUNITY): Payer: 59

## 2016-11-22 ENCOUNTER — Encounter (HOSPITAL_COMMUNITY)
Admission: RE | Admit: 2016-11-22 | Discharge: 2016-11-22 | Disposition: A | Discharge status: Home / Self Care | Payer: 59 | Source: Ambulatory Visit | Attending: Internal Medicine | Admitting: Internal Medicine

## 2016-11-22 DIAGNOSIS — Z951 Presence of aortocoronary bypass graft: Secondary | ICD-10-CM

## 2016-11-22 DIAGNOSIS — I214 Non-ST elevation (NSTEMI) myocardial infarction: Secondary | ICD-10-CM | POA: Diagnosis present

## 2016-11-22 DIAGNOSIS — Z48812 Encounter for surgical aftercare following surgery on the circulatory system: Secondary | ICD-10-CM | POA: Insufficient documentation

## 2016-11-23 ENCOUNTER — Other Ambulatory Visit: Payer: Self-pay | Admitting: Physician Assistant

## 2016-11-25 ENCOUNTER — Encounter (HOSPITAL_COMMUNITY): Payer: 59

## 2016-11-27 ENCOUNTER — Encounter (HOSPITAL_COMMUNITY): Payer: 59

## 2016-11-29 ENCOUNTER — Encounter (HOSPITAL_COMMUNITY)
Admission: RE | Admit: 2016-11-29 | Discharge: 2016-11-29 | Disposition: A | Discharge status: Home / Self Care | Payer: 59 | Source: Ambulatory Visit | Attending: Internal Medicine | Admitting: Internal Medicine

## 2016-12-02 ENCOUNTER — Encounter (HOSPITAL_COMMUNITY): Payer: 59

## 2016-12-04 ENCOUNTER — Encounter (HOSPITAL_COMMUNITY): Payer: 59

## 2016-12-04 NOTE — Progress Notes (Signed)
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 cessation and weight loss goals.       ITP Comments:     ITP Comments    Row Name 10/15/16 0810 11/05/16 1506         ITP Comments Medical Director, Dr. Fransico Him Medical Director, Dr. Fransico Him         Comments: Pt is making expected progress toward personal goals after completing 11 sessions. Recommend continued exercise and life style modification education including  stress management and relaxation techniques to decrease cardiac risk profile.

## 2016-12-06 ENCOUNTER — Encounter (HOSPITAL_COMMUNITY): Payer: 59

## 2016-12-09 ENCOUNTER — Encounter (HOSPITAL_COMMUNITY): Payer: 59

## 2016-12-11 ENCOUNTER — Encounter (HOSPITAL_COMMUNITY): Payer: 59

## 2016-12-13 ENCOUNTER — Encounter (HOSPITAL_COMMUNITY): Payer: 59

## 2016-12-16 ENCOUNTER — Encounter (HOSPITAL_COMMUNITY): Payer: 59

## 2016-12-17 NOTE — Addendum Note (Signed)
Encounter addended by: Lowell Guitar, RN on: 12/17/2016 11:19 AM<BR>    Actions taken: Visit Navigator Flowsheet section accepted, Sign clinical note

## 2016-12-17 NOTE — Progress Notes (Signed)
Discharge Progress Report  Patient Details  Name: [name] MRN: [id number] Date of Birth: August 13, 1965 Referring Provider:     [place] from 10/15/2016 in [place]  Referring Provider  Lauree Chandler MD       Number of Visits: 11  Reason for Discharge:  Early Exit:  Back to work  Smoking History:  History  Smoking Status  . Former Smoker  . Packs/day: 1.00  . Years: 30.00  . Types: Cigarettes  . Quit date: 09/03/2016  Smokeless Tobacco  . Never Used    Diagnosis:  09/03/16 NSTEMI (non-ST elevated myocardial infarction) ([place])  09/09/16 S/P CABG x 3  ADL UCSD:   Initial Exercise Prescription:     Initial Exercise Prescription - 11/28/16 1500      Treadmill   MPH 2.4   Grade 0   Minutes 10   METs 2.84     Bike   Level 0.8   Minutes 10   METs 2.1     NuStep   SPM 80   Minutes 10   METs 2.3     Resistance Training   Training Prescription Yes   Weight 5lbs   Reps 10-15      Discharge Exercise Prescription (Final Exercise Prescription Changes):     Exercise Prescription Changes - 11/28/16 1500      Response to Exercise   Blood Pressure (Admit) 134/70   Blood Pressure (Exercise) 118/68   Blood Pressure (Exit) 122/64   Heart Rate (Admit) 90 bpm   Heart Rate (Exercise) 104 bpm   Heart Rate (Exit) 86 bpm   Rating of Perceived Exertion (Exercise) 12   Duration Progress to 45 minutes of aerobic exercise without signs/symptoms of physical distress   Intensity THRR unchanged     Progression   Progression Continue to progress workloads to maintain intensity without signs/symptoms of physical distress.   Average METs 2.4     NuStep   Level 5     Home Exercise Plan   Plans to continue exercise at Home (comment)   Frequency Add 3 additional days to program exercise sessions.      Functional Capacity:     6 Minute Walk    Row Name 10/15/16 0830 10/15/16 1237       6 Minute  Walk   Phase Initial  -    Distance 1354 feet  -    Walk Time 6 minutes  -    # of Rest Breaks  - 0    MPH  - 2.56    METS  - 3.02    RPE 10  -    VO2 Peak  - 10.59    Symptoms Yes (comment)  -    Comments fatigue, "winded"  -    Resting HR 77 bpm  -    Resting BP 96/60  -    Max Ex. HR 94 bpm  -    Max Ex. BP 104/60  -    2 Minute Post BP  - 104/70       Psychological, QOL, Others - Outcomes: PHQ 2/9: Depression screen PHQ 2/9 10/21/2016  Decreased Interest 0  Down, Depressed, Hopeless 0  PHQ - 2 Score 0    Quality of Life:     Quality of Life - 10/30/16 1104      Quality of Life Scores   Health/Function Pre 19.3 %  pt with health related anxiety from recent cardiac  event. pt reports symptoms are improving especially dyspnea and chest pain.     Socioeconomic Pre 22.5 %   Psych/Spiritual Pre 20.8 %   Family Pre 19.1 %   GLOBAL Pre 20.5 %  pt with overall stress. pt has stressful career with [employer].  pt enjoys spending time with his puppies and his neighbors.        Personal Goals: Goals established at orientation with interventions provided to work toward goal.     Personal Goals and Risk Factors at Admission - 10/15/16 1359      Core Components/Risk Factors/Patient Goals on Admission   Admit Weight 261 lb 11 oz (118.7 kg)       Personal Goals Discharge:     Goals and Risk Factor Review    Row Name 11/05/16 1507 12/04/16 1702 12/17/16 1108         Core Components/Risk Factors/Patient Goals Review   Personal Goals Review Weight Management/Obesity;Tobacco Cessation;Diabetes;Hypertension;Lipids;Stress Weight Management/Obesity;Tobacco Cessation;Diabetes;Hypertension;Lipids;Stress  -     Review pt with multiple CAD RF demonstrated eagerness to participate in CR program.   pt with multiple CAD RF demonstrated eagerness to participate in CR program.   pt unable to attend CR exercise due to work schedule.     Expected Outcomes pt will participate in CR  exercise, nutrition and lifestyle modification education to decrease overall RF.  pt will continue to strive towards tobacco cessation and weight loss goals.  pt will participate in CR exercise, nutrition and lifestyle modification education to decrease overall RF.  pt will continue to strive towards tobacco cessation and weight loss goals.  pt will continue exercise, nutrition and lifestyle modification actvities on his own.         Exercise Goals and Review:     Exercise Goals    Row Name 10/15/16 0829             Exercise Goals   Increase Physical Activity Yes  learn activity limitations       Intervention Provide advice, education, support and counseling about physical activity/exercise needs.;Develop an individualized exercise prescription for aerobic and resistive training based on initial evaluation findings, risk stratification, comorbidities and participant's personal goals.       Expected Outcomes Achievement of increased cardiorespiratory fitness and enhanced flexibility, muscular endurance and strength shown through measurements of functional capacity and personal statement of participant.       Increase Strength and Stamina Yes       Intervention Provide advice, education, support and counseling about physical activity/exercise needs.;Develop an individualized exercise prescription for aerobic and resistive training based on initial evaluation findings, risk stratification, comorbidities and participant's personal goals.       Expected Outcomes Achievement of increased cardiorespiratory fitness and enhanced flexibility, muscular endurance and strength shown through measurements of functional capacity and personal statement of participant.          Nutrition & Weight - Outcomes:     Pre Biometrics - 10/15/16 1244      Pre Biometrics   % Body Fat 36.4 %       Nutrition:     Nutrition Therapy & Goals - 10/16/16 0849      Nutrition Therapy   Diet Carb Modified,  Therapeutic Lifestyle Changes     Personal Nutrition Goals   Nutrition Goal Pt to identify food quantities necessary to achieve weight loss of 6-24 lb (2.7-10.9 kg) at graduation from cardiac rehab. Long-term goal wt of 200 lb desired.  Personal Goal #2 Improved glycemic control as evidenced by an A1c trending toward 7.0 or less     Intervention Plan   Intervention Prescribe, educate and counsel regarding individualized specific dietary modifications aiming towards targeted core components such as weight, hypertension, lipid management, diabetes, heart failure and other comorbidities.   Expected Outcomes Short Term Goal: Understand basic principles of dietary content, such as calories, fat, sodium, cholesterol and nutrients.;Long Term Goal: Adherence to prescribed nutrition plan.      Nutrition Discharge:     Nutrition Assessments - 10/16/16 0849      MEDFICTS Scores   Pre Score 27      Education Questionnaire Score:     Knowledge Questionnaire Score - 10/15/16 1025      Knowledge Questionnaire Score   Pre Score 23/28      Goals reviewed with patient; copy given to patient.

## 2016-12-18 ENCOUNTER — Encounter (HOSPITAL_COMMUNITY): Payer: 59

## 2016-12-20 ENCOUNTER — Encounter (HOSPITAL_COMMUNITY): Payer: 59

## 2016-12-25 ENCOUNTER — Encounter (HOSPITAL_COMMUNITY): Payer: 59

## 2016-12-27 ENCOUNTER — Encounter (HOSPITAL_COMMUNITY): Payer: 59

## 2016-12-30 ENCOUNTER — Encounter (HOSPITAL_COMMUNITY): Payer: 59

## 2017-01-01 ENCOUNTER — Encounter (HOSPITAL_COMMUNITY): Payer: 59

## 2017-01-03 ENCOUNTER — Encounter (HOSPITAL_COMMUNITY): Payer: 59

## 2017-01-05 ENCOUNTER — Other Ambulatory Visit: Payer: Self-pay | Admitting: Physician Assistant

## 2017-01-06 ENCOUNTER — Encounter (HOSPITAL_COMMUNITY): Payer: 59

## 2017-01-08 ENCOUNTER — Encounter (HOSPITAL_COMMUNITY): Payer: 59

## 2017-01-10 ENCOUNTER — Encounter (HOSPITAL_COMMUNITY): Payer: 59

## 2017-01-13 ENCOUNTER — Encounter (HOSPITAL_COMMUNITY): Payer: 59

## 2017-01-15 ENCOUNTER — Encounter (HOSPITAL_COMMUNITY): Payer: 59

## 2017-01-17 ENCOUNTER — Encounter (HOSPITAL_COMMUNITY): Payer: 59

## 2017-01-20 ENCOUNTER — Encounter (HOSPITAL_COMMUNITY): Payer: 59

## 2017-01-22 ENCOUNTER — Encounter (HOSPITAL_COMMUNITY): Payer: 59

## 2017-01-29 ENCOUNTER — Other Ambulatory Visit: Payer: Self-pay | Admitting: Physician Assistant

## 2017-02-03 ENCOUNTER — Ambulatory Visit (INDEPENDENT_AMBULATORY_CARE_PROVIDER_SITE_OTHER): Payer: 59 | Admitting: Cardiovascular Disease

## 2017-02-03 ENCOUNTER — Encounter: Payer: Self-pay | Admitting: Cardiovascular Disease

## 2017-02-03 VITALS — BP 122/70 | HR 77 | Ht 69.0 in | Wt 289.4 lb

## 2017-02-03 DIAGNOSIS — E7800 Pure hypercholesterolemia, unspecified: Secondary | ICD-10-CM | POA: Diagnosis not present

## 2017-02-03 DIAGNOSIS — I2510 Atherosclerotic heart disease of native coronary artery without angina pectoris: Secondary | ICD-10-CM

## 2017-02-03 NOTE — Patient Instructions (Signed)
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
## 2017-09-05 ENCOUNTER — Encounter: Payer: Self-pay | Admitting: Cardiology

## 2017-09-29 ENCOUNTER — Ambulatory Visit: Payer: 59 | Admitting: Cardiology

## 2017-09-29 ENCOUNTER — Encounter: Payer: Self-pay | Admitting: Cardiology

## 2017-09-29 VITALS — BP 160/90 | HR 74 | Ht 69.0 in | Wt 292.0 lb

## 2017-09-29 DIAGNOSIS — Z79899 Other long term (current) drug therapy: Secondary | ICD-10-CM

## 2017-09-29 DIAGNOSIS — I10 Essential (primary) hypertension: Secondary | ICD-10-CM | POA: Diagnosis not present

## 2017-09-29 MED ORDER — LISINOPRIL 5 MG PO TABS: 5.0000 mg | ORAL_TABLET | Freq: Every day | ORAL | 3 refills | Status: DC

## 2017-09-29 NOTE — Patient Instructions (Addendum)
Medication Instructions:  Your physician has recommended you make the following change in your medication:  1-START Lisinopril 5 mg by mouth daily  Labwork: Your physician recommends that you return for lab work in: 7 days for BMET   Testing/Procedures: NONE  Follow-Up: Your physician recommends that you schedule a follow-up appointment in: 3 weeks with blood pressure clinic.  Your physician wants you to follow-up in: 6 months with Dr. Angelena Form. You will receive a reminder letter in the mail two months in advance. If you don't receive a letter, please call our office to schedule the follow-up appointment.   If you need a refill on your cardiac medications before your next appointment, please call your pharmacy.
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
## 2017-10-06 ENCOUNTER — Other Ambulatory Visit: Payer: Self-pay

## 2017-10-24 ENCOUNTER — Ambulatory Visit: Payer: Self-pay

## 2017-10-24 NOTE — Progress Notes (Deleted)
Patient ID: [name]                 DOB: [birth year]                      MRN: [id number]     HPI: [name] is a 52 y.o. male patient of Dr. Angelena Form who presents today for hypertension evaluation. PMH significant for HIV, hypertension, hyperlipidemia, tobacco abuse, type 2 diabetes, obstructive sleep apnea and ulcerative colitis. At his most recent OV with Ellen Henri, PA, he was started on lisinopril.  He presents today for BP check and additional management.     Current HTN meds:  Furosemide 40mg  BID Lisinopril 5mg  daily Metoprolol tartrate 25mg  BID  Previously tried:   BP goal: <130/80  Family History:   Social History: former smoker, denies alcohol  Diet:   Exercise:   Home BP readings:   Wt Readings from Last 3 Encounters:  09/29/17 292 lb (132.5 kg)  02/03/17 289 lb 6.4 oz (131.3 kg)  10/25/16 265 lb (120.2 kg)   BP Readings from Last 3 Encounters:  09/29/17 (!) 160/90  02/03/17 122/70  10/25/16 120/68   Pulse Readings from Last 3 Encounters:  09/29/17 74  02/03/17 77  10/25/16 80    Renal function: CrCl cannot be calculated (Patient's most recent lab result is older than the maximum 21 days allowed.).  Past Medical History:  Diagnosis Date  . Asthma   . CKD (chronic kidney disease), stage II   . Coronary artery disease    a. NSTEMI s/p DES to Cx in 2012. b. NSTEMI 08/2016 s/p CABG  . Diabetes mellitus type 2 with complications ([place])   . Hepatitis B   . HIV (human immunodeficiency virus infection) ([place])    on therapy  . HLD (hyperlipidemia)   . Hypertension   . MI (myocardial infarction) ([place])   . Multiple thyroid nodules   . Renal stone 10/2013  . Sleep apnea    does not wear cpap  . Subdural hematoma ([place])   . Tobacco abuse   . Ulcerative colitis ([place])     Current Outpatient Medications on File Prior to Visit  Medication Sig Dispense Refill  . aspirin EC 81 MG tablet Take 81 mg by mouth daily.    Marland Kitchen buPROPion (WELLBUTRIN XL) 150  MG 24 hr tablet Take 150 mg by mouth daily.    . clopidogrel (PLAVIX) 75 MG tablet Take 75 mg by mouth daily.    . dolutegravir (TIVICAY) 50 MG tablet Take 50 mg by mouth daily.    . empagliflozin (JARDIANCE) 25 MG TABS tablet Take 25 mg by mouth daily.    Marland Kitchen emtricitabine-tenofovir AF (DESCOVY) 200-25 MG tablet Take 1 tablet by mouth daily.    . furosemide (LASIX) 40 MG tablet Take 40 mg by mouth 2 (two) times daily.     Marland Kitchen glimepiride (AMARYL) 4 MG tablet Take 4 mg by mouth daily with breakfast.    . lisinopril (PRINIVIL,ZESTRIL) 5 MG tablet Take 1 tablet (5 mg total) by mouth daily. 90 tablet 3  . metFORMIN (GLUCOPHAGE) 1000 MG tablet Take 1,000 mg by mouth 2 (two) times daily with a meal.    . metoprolol tartrate (LOPRESSOR) 25 MG tablet TAKE 1 TABLET BY MOUTH TWICE A DAY 30 tablet 0  . pravastatin (PRAVACHOL) 40 MG tablet Take 1 tablet (40 mg total) by mouth every evening. 90 tablet 3  . sertraline (ZOLOFT) 50 MG tablet  Take 1 tablet (50 mg total) by mouth daily. 30 tablet 1   No current facility-administered medications on file prior to visit.     Allergies  Allergen Reactions  . No Known Allergies     There were no vitals taken for this visit.   Assessment/Plan: Hypertension: BMET today.   Thank you, Lelan Pons. Patterson Hammersmith, [facility]  10/24/2017 7:15 AM

## 2017-11-22 IMAGING — US US THYROID
1 series · 13 of 25 positions shown · non-contrast
Comparison: None.

CLINICAL DATA: Incidental on CT.

EXAM:
THYROID ULTRASOUND
TECHNIQUE: Ultrasound examination of the thyroid gland and adjacent soft
tissues was performed.

[Series 1: us thyroid · 0.09mm/px · 46 acquisitions, 13 frames shown]
[im 1/46]
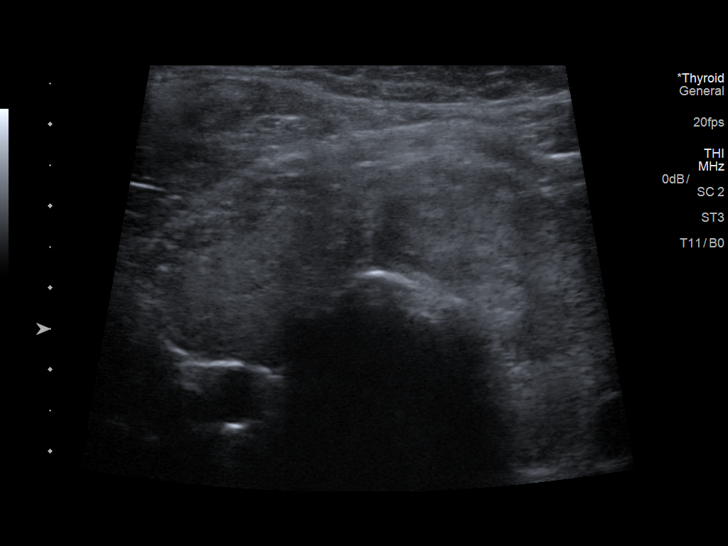
[im 4/46]
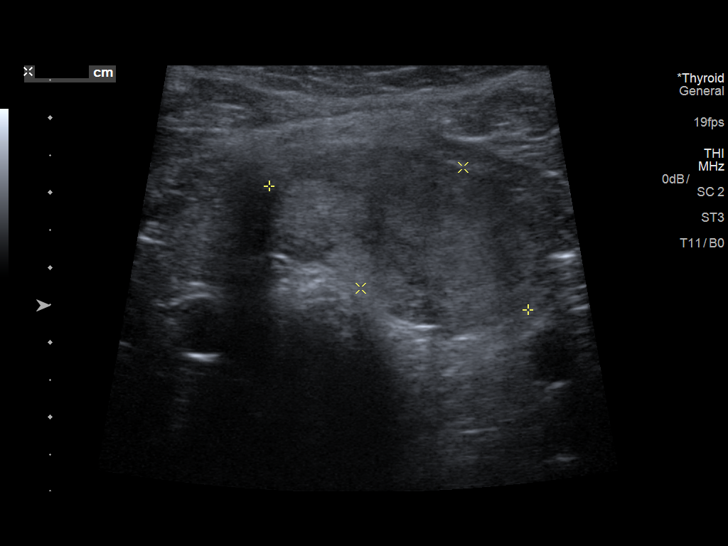
[im 8/46]
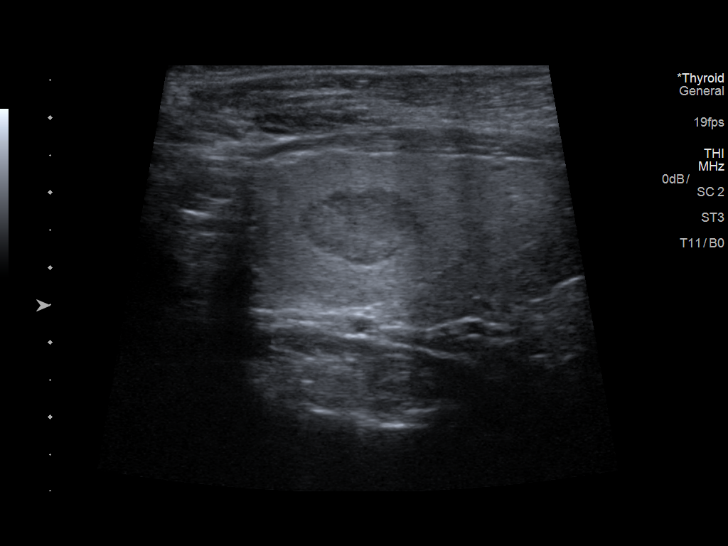
[im 12/46]
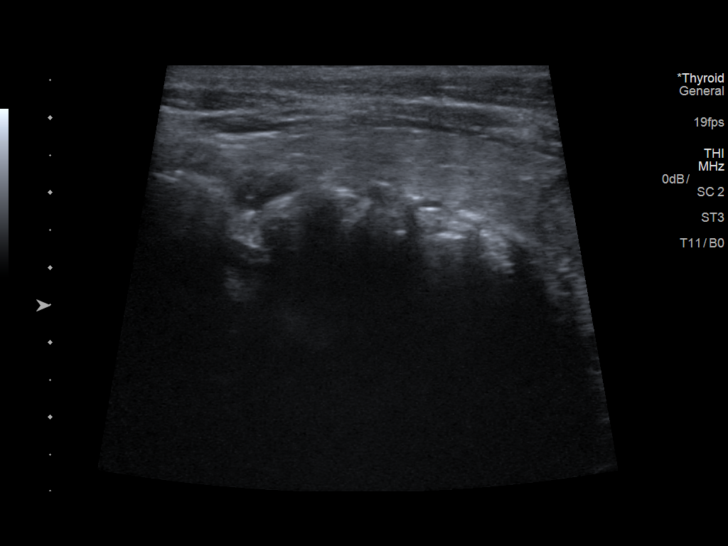
[im 16/46]
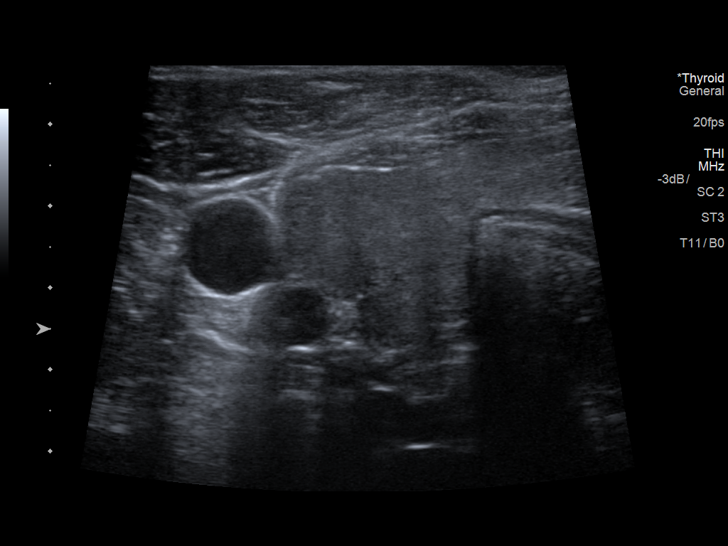
[im 19/46]
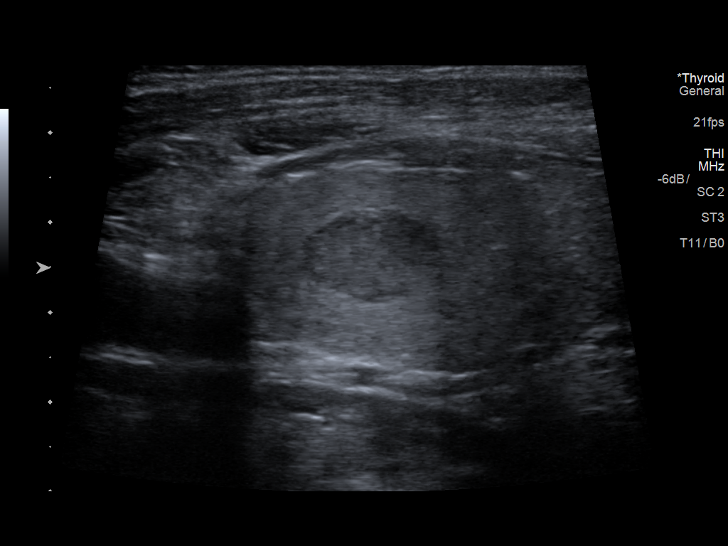
[im 23/46]
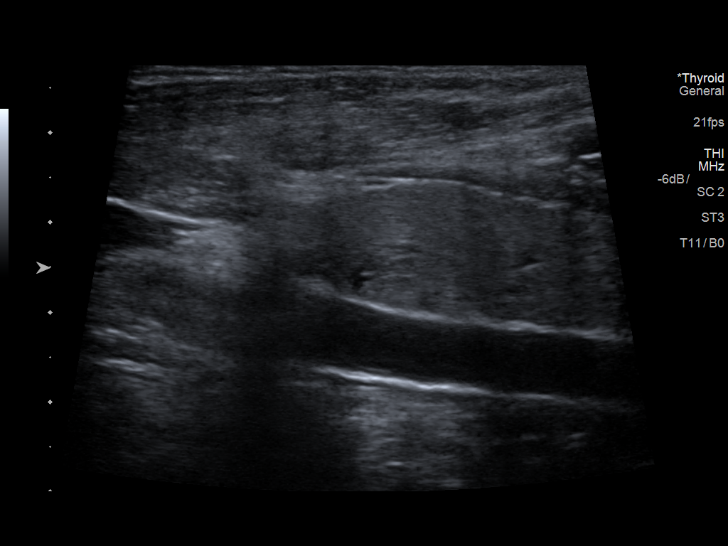
[im 27/46]
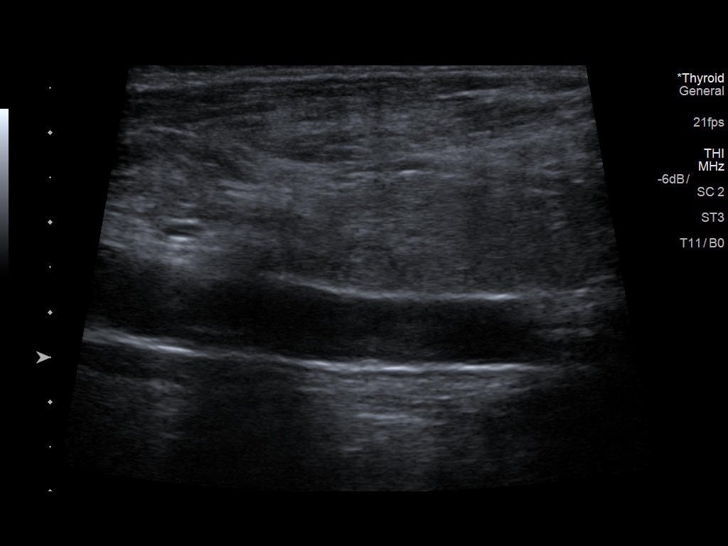
[im 31/46]
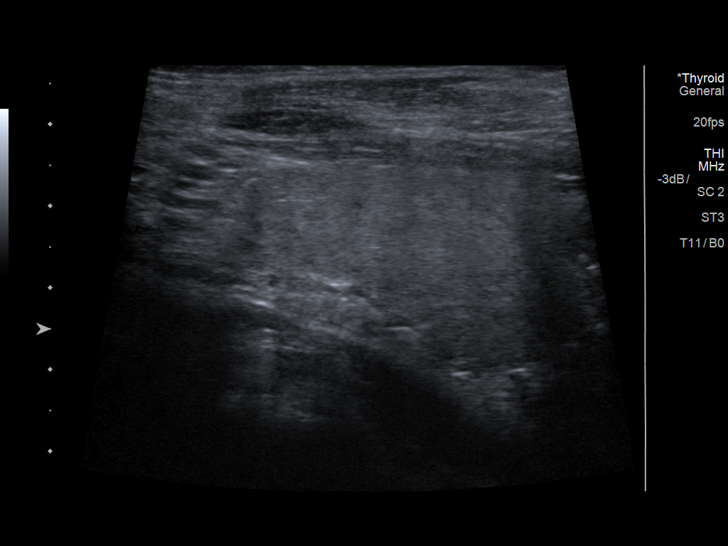
[im 34/46]
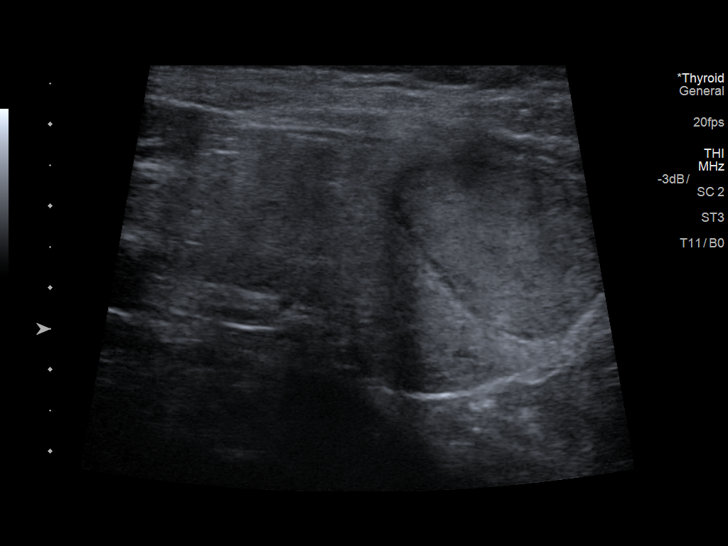
[im 38/46]
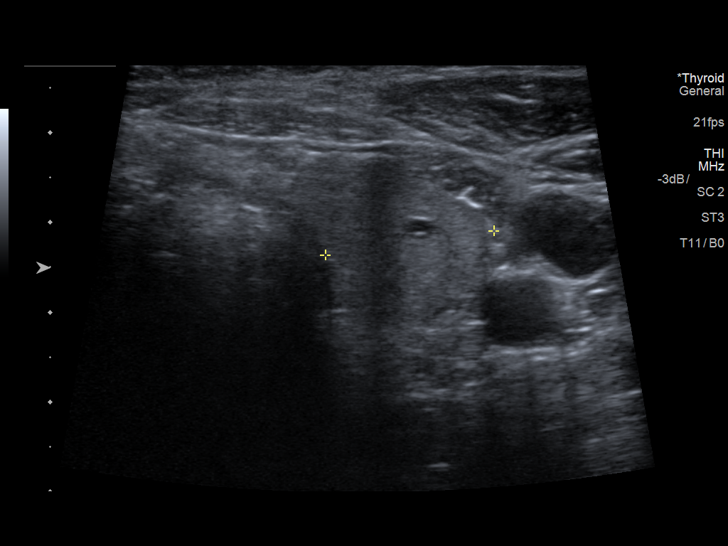
[im 42/46]
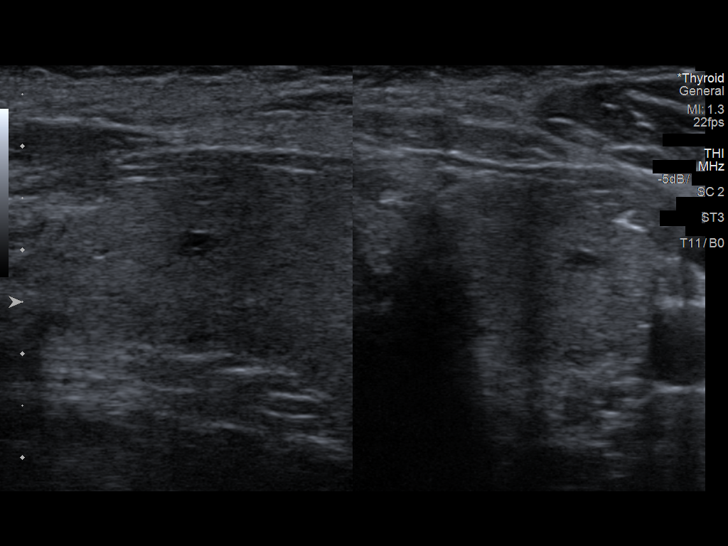
[im 46/46]
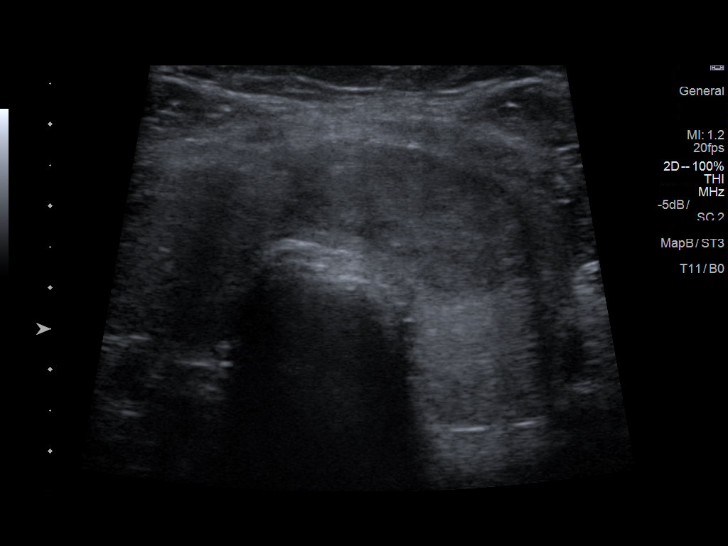

[13 of 25 positions shown; findings below may reference images not displayed]

FINDINGS: Parenchymal Echotexture: Moderately heterogenous

Isthmus: 1.3 cm

Right lobe: 6.0 x 2.7 x 2.3 cm

Left lobe: 6.0 x 3.2 x 1.9 cm

_________________________________________________________

Estimated total number of nodules >/= 1 cm: 3

Number of spongiform nodules >/=  2 cm not described below (TR1): 0

Number of mixed cystic and solid nodules >/= 1.5 cm not described
below (TR2): 0

_________________________________________________________

Nodule # 1:

Location: Isthmus; Inferior

Maximum size: 3.8 cm; Other 2 dimensions: 3.5 x 2.1 cm

Composition: solid/almost completely solid (2)

Echogenicity: hypoechoic (2)

Shape: not taller-than-wide (0)

Margins: smooth (0)

Echogenic foci: none (0)

ACR TI-RADS total points: 4.

ACR TI-RADS risk category: TR4 (4-6 points).

ACR TI-RADS recommendations:

**Given size (>/= 1.5 cm) and appearance, fine needle aspiration of
this moderately suspicious nodule should be considered based on
TI-RADS criteria.

_________________________________________________________

Nodule # 2:

Location: Right; Superior

Maximum size: 1.7 cm; Other 2 dimensions: 1.5 x 1.0 cm

Composition: solid/almost completely solid (2)

Echogenicity: hypoechoic (2)

Shape: not taller-than-wide (0)

Margins: smooth (0)

Echogenic foci: none (0)

ACR TI-RADS total points: 4.

ACR TI-RADS risk category: TR4 (4-6 points).

ACR TI-RADS recommendations:

**Given size (>/= 1.5 cm) and appearance, fine needle aspiration of
this moderately suspicious nodule should be considered based on
TI-RADS criteria.

_________________________________________________________

Nodule # 3:

Location: Right; Mid

Maximum size: 1.7 cm; Other 2 dimensions: 1.2 x 1.0 cm

Composition: solid/almost completely solid (2)

Echogenicity: hypoechoic (2)

Shape: not taller-than-wide (0)

Margins: smooth (0)

Echogenic foci: none (0)

ACR TI-RADS total points: 4.

ACR TI-RADS risk category: TR4 (4-6 points).

ACR TI-RADS recommendations:

**Given size (>/= 1.5 cm) and appearance, fine needle aspiration of
this moderately suspicious nodule should be considered based on
TI-RADS criteria.

_________________________________________________________

Tiny left upper pole nodule measures 3 mm.
IMPRESSION: Multiple nodules are noted. There are 3 nodules that meet criteria
for fine needle aspiration biopsy. Biopsy is recommended for nodules
1 and 2.

The above is in keeping with the ACR TI-RADS recommendations - [HOSPITAL] 5960;[DATE].

## 2017-11-26 ENCOUNTER — Other Ambulatory Visit: Payer: Self-pay | Admitting: Physician Assistant

## 2017-11-28 ENCOUNTER — Other Ambulatory Visit: Payer: Self-pay | Admitting: Physician Assistant

## 2017-11-30 ENCOUNTER — Other Ambulatory Visit: Payer: Self-pay | Admitting: Physician Assistant

## 2018-01-08 ENCOUNTER — Telehealth: Payer: Self-pay | Admitting: Cardiovascular Disease

## 2018-01-08 MED ORDER — METOPROLOL TARTRATE 25 MG PO TABS: ORAL_TABLET | ORAL | 3 refills | Status: DC

## 2018-01-08 NOTE — Telephone Encounter (Signed)
New Message    *STAT* If patient is at the pharmacy, call can be transferred to refill team.   1. Which medications need to be refilled? (please list name of each medication and dose if known) metoprolol tartrate (LOPRESSOR) 25 MG tablet    2. Which pharmacy/location (including street and city if local pharmacy) is medication to be sent to? [id number] - [place], [address]  3. Do they need a 30 day or 90 day supply? 30  Patient scheduled appt. Is aware that he needs to keep appt.
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
## 2018-02-10 ENCOUNTER — Ambulatory Visit: Payer: 59 | Admitting: Physician Assistant

## 2018-02-10 ENCOUNTER — Encounter: Payer: Self-pay | Admitting: Physician Assistant

## 2018-02-10 VITALS — BP 142/92 | HR 82 | Ht 69.0 in | Wt 293.4 lb

## 2018-02-10 DIAGNOSIS — I2510 Atherosclerotic heart disease of native coronary artery without angina pectoris: Secondary | ICD-10-CM

## 2018-02-10 DIAGNOSIS — B20 Human immunodeficiency virus [HIV] disease: Secondary | ICD-10-CM

## 2018-02-10 DIAGNOSIS — E7849 Other hyperlipidemia: Secondary | ICD-10-CM

## 2018-02-10 DIAGNOSIS — N182 Chronic kidney disease, stage 2 (mild): Secondary | ICD-10-CM

## 2018-02-10 DIAGNOSIS — I10 Essential (primary) hypertension: Secondary | ICD-10-CM | POA: Diagnosis not present

## 2018-02-10 DIAGNOSIS — E66813 Obesity, class 3: Secondary | ICD-10-CM | POA: Insufficient documentation

## 2018-02-10 MED ORDER — LISINOPRIL 10 MG PO TABS: 10.0000 mg | ORAL_TABLET | Freq: Every day | ORAL | 3 refills | Status: AC

## 2018-02-10 NOTE — Patient Instructions (Addendum)
Medication Instructions:  Your physician has recommended you make the following change in your medication:   INCREASE: lisinopril to 10 mg once a day   Labwork: Your physician recommends that you return for lab work on the same day as your appointment with Ermalinda Barrios, PA for BMET---Your appointment has been scheduled for 03/11/18 at 9:00 AM   Testing/Procedures: None ordered  Follow-Up: Your physician recommends that you schedule a follow-up appointment in: 1 month with Ermalinda Barrios, PA---Your appointment has been scheduled for 03/11/18 at 9:00 AM   Any Other Special Instructions Will Be Listed Below (If Applicable).  YOU SHOULD BE GETTING 30 MINUTES OF EXERCISE DAILY   If you need a refill on your cardiac medications before your next appointment, please call your pharmacy.

## 2018-02-26 ENCOUNTER — Ambulatory Visit: Payer: 59 | Admitting: Podiatry

## 2018-02-26 ENCOUNTER — Ambulatory Visit (INDEPENDENT_AMBULATORY_CARE_PROVIDER_SITE_OTHER): Payer: 59

## 2018-02-26 ENCOUNTER — Encounter: Payer: Self-pay | Admitting: Podiatry

## 2018-02-26 DIAGNOSIS — M7751 Other enthesopathy of right foot: Secondary | ICD-10-CM

## 2018-02-26 DIAGNOSIS — M2041 Other hammer toe(s) (acquired), right foot: Secondary | ICD-10-CM | POA: Diagnosis not present
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
## 2018-03-11 ENCOUNTER — Ambulatory Visit: Payer: 59 | Admitting: Physician Assistant

## 2018-03-11 ENCOUNTER — Encounter: Payer: Self-pay | Admitting: Physician Assistant

## 2018-03-11 VITALS — BP 138/80 | HR 82 | Ht 69.0 in | Wt 294.8 lb

## 2018-03-11 DIAGNOSIS — E782 Mixed hyperlipidemia: Secondary | ICD-10-CM

## 2018-03-11 DIAGNOSIS — N182 Chronic kidney disease, stage 2 (mild): Secondary | ICD-10-CM | POA: Diagnosis not present

## 2018-03-11 DIAGNOSIS — I10 Essential (primary) hypertension: Secondary | ICD-10-CM | POA: Diagnosis not present

## 2018-03-11 DIAGNOSIS — I2510 Atherosclerotic heart disease of native coronary artery without angina pectoris: Secondary | ICD-10-CM | POA: Diagnosis not present

## 2018-03-11 MED ORDER — CLOPIDOGREL BISULFATE 75 MG PO TABS: 75.0000 mg | ORAL_TABLET | Freq: Every day | ORAL | 3 refills | Status: DC

## 2018-03-11 MED ORDER — PRAVASTATIN SODIUM 40 MG PO TABS: 40.0000 mg | ORAL_TABLET | Freq: Every evening | ORAL | 3 refills | Status: AC

## 2018-03-11 NOTE — Patient Instructions (Signed)
Medication Instructions:  Your physician recommends that you continue on your current medications as directed. Please refer to the Current Medication list given to you today.  If you need a refill on your cardiac medications before your next appointment, please call your pharmacy.   Lab work: TODAY: BMET  If you have labs (blood work) drawn today and your tests are completely normal, you will receive your results only by: Marland Kitchen MyChart Message (if you have MyChart) OR . A paper copy in the mail If you have any lab test that is abnormal or we need to change your treatment, we will call you to review the results.  Testing/Procedures: None ordered  Follow-Up: At [facility], you and your health needs are our priority.  As part of our continuing mission to provide you with exceptional heart care, we have created designated Provider Care Teams.  These Care Teams include your primary Cardiologist (physician) and Advanced Practice Providers (APPs -  Physician Assistants and Nurse Practitioners) who all work together to provide you with the care you need, when you need it. . You will need a follow up appointment in 6 months.  Please call our office 2 months in advance to schedule this appointment.  You may see Darlina Guys, MD or one of the following Advanced Practice Providers on your designated Care Team:   . Lyda Jester, PA-C . Dayna Dunn, PA-C . Ermalinda Barrios, PA-C  Any Other Special Instructions Will Be Listed Below (If Applicable).

## 2018-05-01 ENCOUNTER — Other Ambulatory Visit: Payer: Self-pay | Admitting: Internal Medicine

## 2018-05-01 ENCOUNTER — Other Ambulatory Visit (HOSPITAL_COMMUNITY): Payer: Self-pay | Admitting: Internal Medicine

## 2018-05-01 DIAGNOSIS — R748 Abnormal levels of other serum enzymes: Secondary | ICD-10-CM

## 2018-05-09 ENCOUNTER — Encounter (HOSPITAL_BASED_OUTPATIENT_CLINIC_OR_DEPARTMENT_OTHER): Payer: Self-pay | Admitting: Emergency Medicine

## 2018-05-09 ENCOUNTER — Emergency Department (HOSPITAL_BASED_OUTPATIENT_CLINIC_OR_DEPARTMENT_OTHER): Payer: 59

## 2018-05-09 ENCOUNTER — Emergency Department (HOSPITAL_BASED_OUTPATIENT_CLINIC_OR_DEPARTMENT_OTHER)
Admission: EM | Admit: 2018-05-09 | Discharge: 2018-05-09 | Disposition: A | Discharge status: Home / Self Care | Payer: 59 | Attending: Emergency Medicine | Admitting: Emergency Medicine

## 2018-05-09 ENCOUNTER — Other Ambulatory Visit: Payer: Self-pay

## 2018-05-09 DIAGNOSIS — E1122 Type 2 diabetes mellitus with diabetic chronic kidney disease: Secondary | ICD-10-CM | POA: Insufficient documentation

## 2018-05-09 DIAGNOSIS — R51 Headache: Secondary | ICD-10-CM | POA: Diagnosis present

## 2018-05-09 DIAGNOSIS — Z951 Presence of aortocoronary bypass graft: Secondary | ICD-10-CM | POA: Diagnosis not present

## 2018-05-09 DIAGNOSIS — I2510 Atherosclerotic heart disease of native coronary artery without angina pectoris: Secondary | ICD-10-CM | POA: Diagnosis not present

## 2018-05-09 DIAGNOSIS — R519 Headache, unspecified: Secondary | ICD-10-CM

## 2018-05-09 DIAGNOSIS — Z7982 Long term (current) use of aspirin: Secondary | ICD-10-CM | POA: Diagnosis not present

## 2018-05-09 DIAGNOSIS — Z79899 Other long term (current) drug therapy: Secondary | ICD-10-CM | POA: Diagnosis not present

## 2018-05-09 DIAGNOSIS — Z7984 Long term (current) use of oral hypoglycemic drugs: Secondary | ICD-10-CM | POA: Insufficient documentation

## 2018-05-09 DIAGNOSIS — Z7902 Long term (current) use of antithrombotics/antiplatelets: Secondary | ICD-10-CM | POA: Insufficient documentation

## 2018-05-09 DIAGNOSIS — Z87891 Personal history of nicotine dependence: Secondary | ICD-10-CM | POA: Diagnosis not present

## 2018-05-09 DIAGNOSIS — B20 Human immunodeficiency virus [HIV] disease: Secondary | ICD-10-CM | POA: Diagnosis not present

## 2018-05-09 DIAGNOSIS — N182 Chronic kidney disease, stage 2 (mild): Secondary | ICD-10-CM | POA: Insufficient documentation

## 2018-05-09 DIAGNOSIS — I129 Hypertensive chronic kidney disease with stage 1 through stage 4 chronic kidney disease, or unspecified chronic kidney disease: Secondary | ICD-10-CM | POA: Diagnosis not present

## 2018-05-09 DIAGNOSIS — I252 Old myocardial infarction: Secondary | ICD-10-CM | POA: Insufficient documentation

## 2018-05-09 LAB — CBC WITH DIFFERENTIAL/PLATELET
ABS IMMATURE GRANULOCYTES: 0.08 10*3/uL — AB (ref 0.00–0.07)
Basophils Absolute: 0.1 10*3/uL (ref 0.0–0.1)
Basophils Relative: 1 %
EOS PCT: 1 %
Eosinophils Absolute: 0.1 10*3/uL (ref 0.0–0.5)
HCT: 50 % (ref 39.0–52.0)
Hemoglobin: 15.9 g/dL (ref 13.0–17.0)
IMMATURE GRANULOCYTES: 1 %
Lymphocytes Relative: 44 %
Lymphs Abs: 2.9 10*3/uL (ref 0.7–4.0)
MCH: 27 pg (ref 26.0–34.0)
MCHC: 31.8 g/dL (ref 30.0–36.0)
MCV: 84.9 fL (ref 80.0–100.0)
Monocytes Absolute: 0.8 10*3/uL (ref 0.1–1.0)
Monocytes Relative: 12 %
NEUTROS PCT: 41 %
NRBC: 0 % (ref 0.0–0.2)
Neutro Abs: 2.7 10*3/uL (ref 1.7–7.7)
Platelets: 151 10*3/uL (ref 150–400)
RBC: 5.89 MIL/uL — AB (ref 4.22–5.81)
RDW: 14.4 % (ref 11.5–15.5)
WBC: 6.6 10*3/uL (ref 4.0–10.5)

## 2018-05-09 LAB — BASIC METABOLIC PANEL
Anion gap: 11 (ref 5–15)
BUN: 9 mg/dL (ref 6–20)
CO2: 18 mmol/L — ABNORMAL LOW (ref 22–32)
CREATININE: 0.75 mg/dL (ref 0.61–1.24)
Calcium: 8.6 mg/dL — ABNORMAL LOW (ref 8.9–10.3)
Chloride: 103 mmol/L (ref 98–111)
GFR calc Af Amer: >60 mL/min (ref >60)
GLUCOSE: 166 mg/dL — AB (ref 70–99)
POTASSIUM: 3.4 mmol/L — AB (ref 3.5–5.1)
Sodium: 132 mmol/L — ABNORMAL LOW (ref 135–145)

## 2018-05-09 MED ORDER — KETOROLAC TROMETHAMINE 15 MG/ML IJ SOLN
15.0000 mg | Freq: Once | INTRAMUSCULAR | Status: AC
  Administered 2018-05-09: 15 mg via INTRAVENOUS
  Filled 2018-05-09: qty 1

## 2018-05-09 MED ORDER — DIPHENHYDRAMINE HCL 50 MG/ML IJ SOLN
25.0000 mg | Freq: Once | INTRAMUSCULAR | Status: AC
  Administered 2018-05-09: 25 mg via INTRAVENOUS
  Filled 2018-05-09: qty 1

## 2018-05-09 MED ORDER — METOCLOPRAMIDE HCL 5 MG/ML IJ SOLN
10.0000 mg | Freq: Once | INTRAMUSCULAR | Status: AC
  Administered 2018-05-09: 10 mg via INTRAVENOUS
  Filled 2018-05-09: qty 2

## 2018-05-09 NOTE — ED Triage Notes (Signed)
Pt c/o headache onset Tuesday. Pt reports onset of N/V today. Pt denies changes of vision, reports generalized weakness.
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
## 2018-05-09 NOTE — Discharge Instructions (Signed)
Please read and follow all provided instructions.  Your diagnoses today include:  1. Acute nonintractable headache, unspecified headache type     Tests performed today include:  CT of your head which was normal and did not show any serious cause of your headache  Blood counts and electrolytes  Vital signs. See below for your results today.   Medications:  In the Emergency Department you received:  Reglan - antinausea/headache medication  Benadryl - antihistamine to counteract potential side effects of reglan  Toradol - NSAID medication similar to ibuprofen  Take any prescribed medications only as directed.  Additional information:  Follow any educational materials contained in this packet.  You are having a headache. No specific cause was found today for your headache. It may have been a migraine or other cause of headache. Stress, anxiety, fatigue, and depression are common triggers for headaches.   Your headache today does not appear to be life-threatening or require hospitalization, but often the exact cause of headaches is not determined in the emergency department. Therefore, follow-up with your doctor is very important to find out what may have caused your headache and whether or not you need any further diagnostic testing or treatment.   Sometimes headaches can appear benign (not harmful), but then more serious symptoms can develop which should prompt an immediate re-evaluation by your doctor or the emergency department.  BE VERY CAREFUL not to take multiple medicines containing Tylenol (also called acetaminophen). Doing so can lead to an overdose which can damage your liver and cause liver failure and possibly death.   Follow-up instructions: Please follow-up with your primary care provider in the next 3 days for further evaluation of your symptoms.   Return instructions:   Please return to the Emergency Department if you experience worsening symptoms.  Return if  the medications do not resolve your headache, if it recurs, or if you have multiple episodes of vomiting or cannot keep down fluids.  Return if you have a change from the usual headache.  RETURN IMMEDIATELY IF you:  Develop a sudden, severe headache  Develop confusion or become poorly responsive or faint  Develop a fever above 100.58F or problem breathing  Have a change in speech, vision, swallowing, or understanding  Develop new weakness, numbness, tingling, incoordination in your arms or legs  Have a seizure  Please return if you have any other emergent concerns.  Additional Information:  Your vital signs today were: BP (!) 161/104 (BP Location: Right Arm)    Pulse (!) 110    Temp 97.9 F (36.6 C) (Oral)    Resp 20    Ht 5\' 9"  (1.753 m)    Wt 129.3 kg    SpO2 98%    BMI 42.09 kg/m  If your blood pressure (BP) was elevated above 135/85 this visit, please have this repeated by your doctor within one month. --------------

## 2018-05-10 ENCOUNTER — Other Ambulatory Visit: Payer: Self-pay

## 2018-05-10 ENCOUNTER — Emergency Department (HOSPITAL_BASED_OUTPATIENT_CLINIC_OR_DEPARTMENT_OTHER)
Admission: EM | Admit: 2018-05-10 | Discharge: 2018-05-11 | Disposition: A | Discharge status: Home / Self Care | Payer: 59 | Attending: Emergency Medicine | Admitting: Emergency Medicine

## 2018-05-10 ENCOUNTER — Encounter (HOSPITAL_BASED_OUTPATIENT_CLINIC_OR_DEPARTMENT_OTHER): Payer: Self-pay

## 2018-05-10 DIAGNOSIS — Z951 Presence of aortocoronary bypass graft: Secondary | ICD-10-CM | POA: Insufficient documentation

## 2018-05-10 DIAGNOSIS — R51 Headache: Secondary | ICD-10-CM | POA: Diagnosis present

## 2018-05-10 DIAGNOSIS — Z87891 Personal history of nicotine dependence: Secondary | ICD-10-CM | POA: Insufficient documentation

## 2018-05-10 DIAGNOSIS — Z7984 Long term (current) use of oral hypoglycemic drugs: Secondary | ICD-10-CM | POA: Insufficient documentation

## 2018-05-10 DIAGNOSIS — E1122 Type 2 diabetes mellitus with diabetic chronic kidney disease: Secondary | ICD-10-CM | POA: Diagnosis not present

## 2018-05-10 DIAGNOSIS — B20 Human immunodeficiency virus [HIV] disease: Secondary | ICD-10-CM | POA: Insufficient documentation

## 2018-05-10 DIAGNOSIS — J45909 Unspecified asthma, uncomplicated: Secondary | ICD-10-CM | POA: Insufficient documentation

## 2018-05-10 DIAGNOSIS — I2510 Atherosclerotic heart disease of native coronary artery without angina pectoris: Secondary | ICD-10-CM | POA: Diagnosis not present

## 2018-05-10 DIAGNOSIS — N182 Chronic kidney disease, stage 2 (mild): Secondary | ICD-10-CM | POA: Insufficient documentation

## 2018-05-10 DIAGNOSIS — G43009 Migraine without aura, not intractable, without status migrainosus: Secondary | ICD-10-CM | POA: Diagnosis not present

## 2018-05-10 DIAGNOSIS — Z7982 Long term (current) use of aspirin: Secondary | ICD-10-CM | POA: Diagnosis not present

## 2018-05-10 DIAGNOSIS — Z79899 Other long term (current) drug therapy: Secondary | ICD-10-CM | POA: Diagnosis not present

## 2018-05-10 DIAGNOSIS — Z8679 Personal history of other diseases of the circulatory system: Secondary | ICD-10-CM | POA: Diagnosis not present

## 2018-05-10 DIAGNOSIS — I129 Hypertensive chronic kidney disease with stage 1 through stage 4 chronic kidney disease, or unspecified chronic kidney disease: Secondary | ICD-10-CM | POA: Insufficient documentation

## 2018-05-10 MED ORDER — DIPHENHYDRAMINE HCL 50 MG/ML IJ SOLN
25.0000 mg | Freq: Once | INTRAMUSCULAR | Status: AC
  Administered 2018-05-10: 25 mg via INTRAVENOUS
  Filled 2018-05-10: qty 1

## 2018-05-10 MED ORDER — METOCLOPRAMIDE HCL 5 MG/ML IJ SOLN
10.0000 mg | Freq: Once | INTRAMUSCULAR | Status: AC
  Administered 2018-05-10: 10 mg via INTRAVENOUS
  Filled 2018-05-10: qty 2

## 2018-05-10 MED ORDER — DEXAMETHASONE SODIUM PHOSPHATE 10 MG/ML IJ SOLN
10.0000 mg | Freq: Once | INTRAMUSCULAR | Status: AC
  Administered 2018-05-10: 10 mg via INTRAVENOUS
  Filled 2018-05-10: qty 1

## 2018-05-10 MED ORDER — KETOROLAC TROMETHAMINE 30 MG/ML IJ SOLN
30.0000 mg | Freq: Once | INTRAMUSCULAR | Status: AC
  Administered 2018-05-10: 30 mg via INTRAVENOUS
  Filled 2018-05-10: qty 1

## 2018-05-10 NOTE — ED Notes (Signed)
RN rounded on patient and asked if he felt better and was ready to go home. He stated he was feeling some better but wanted a little longer. He drifted off to sleep. Provider will be notified

## 2018-05-10 NOTE — ED Triage Notes (Signed)
Pt was seen in the ED for migraine HA yesterday. HA returned when he awoke this morning. Pt states he is having constant nausea.
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
## 2018-05-11 ENCOUNTER — Other Ambulatory Visit: Payer: Self-pay | Admitting: Cardiovascular Disease

## 2018-05-11 NOTE — ED Notes (Signed)
Pt understood dc material. NAD noted. 

## 2018-05-12 ENCOUNTER — Encounter (HOSPITAL_COMMUNITY): Payer: Self-pay

## 2018-05-12 ENCOUNTER — Ambulatory Visit (HOSPITAL_COMMUNITY): Payer: 59

## 2018-05-12 ENCOUNTER — Encounter (HOSPITAL_COMMUNITY): Admission: RE | Admit: 2018-05-12 | Payer: 59 | Source: Ambulatory Visit

## 2018-05-20 ENCOUNTER — Telehealth: Payer: Self-pay | Admitting: Cardiovascular Disease

## 2018-05-20 NOTE — Telephone Encounter (Signed)
Spoke with pt and pt  had noted SOB for 1 1/2 weeks . Was seen in ED  05-10-18 for Migraines ,shortness of breath ,and sweating no testing for heart related illnesses was done . Per pt migraines are gone but continues to have SOB.  Appt made on 05-27-18 at 11:30 am with Ermalinda Barrios PA. Will forward to Dr Angelena Form for review .Adonis Housekeeper

## 2018-05-20 NOTE — Telephone Encounter (Signed)
Thanks!    Chris.

## 2018-05-20 NOTE — Telephone Encounter (Signed)
New Message    Pt c/o Shortness Of Breath: STAT if SOB developed within the last 24 hours or pt is noticeably SOB on the phone  1. Are you currently SOB (can you hear that pt is SOB on the phone)? Yes  2. How long have you been experiencing SOB? For about two weeks, patient went to ER and was told it was because of Migraine but he states his migraines are under control and he's still SOB  3. Are you SOB when sitting or when up moving around? Moving around   4. Are you currently experiencing any other symptoms? NO

## 2018-05-24 ENCOUNTER — Emergency Department (HOSPITAL_BASED_OUTPATIENT_CLINIC_OR_DEPARTMENT_OTHER): Payer: 59

## 2018-05-24 ENCOUNTER — Encounter (HOSPITAL_BASED_OUTPATIENT_CLINIC_OR_DEPARTMENT_OTHER): Payer: Self-pay | Admitting: Adult Health

## 2018-05-24 ENCOUNTER — Other Ambulatory Visit: Payer: Self-pay

## 2018-05-24 ENCOUNTER — Inpatient Hospital Stay (HOSPITAL_BASED_OUTPATIENT_CLINIC_OR_DEPARTMENT_OTHER)
Admission: EM | Admit: 2018-05-24 | Discharge: 2018-06-02 | DRG: 871 | Disposition: A | Discharge status: Home / Self Care | Payer: 59 | Attending: Internal Medicine | Admitting: Internal Medicine

## 2018-05-24 DIAGNOSIS — I252 Old myocardial infarction: Secondary | ICD-10-CM | POA: Diagnosis not present

## 2018-05-24 DIAGNOSIS — E785 Hyperlipidemia, unspecified: Secondary | ICD-10-CM | POA: Diagnosis present

## 2018-05-24 DIAGNOSIS — E119 Type 2 diabetes mellitus without complications: Secondary | ICD-10-CM | POA: Diagnosis not present

## 2018-05-24 DIAGNOSIS — Z9089 Acquired absence of other organs: Secondary | ICD-10-CM

## 2018-05-24 DIAGNOSIS — K7689 Other specified diseases of liver: Secondary | ICD-10-CM

## 2018-05-24 DIAGNOSIS — R1011 Right upper quadrant pain: Secondary | ICD-10-CM | POA: Diagnosis not present

## 2018-05-24 DIAGNOSIS — R918 Other nonspecific abnormal finding of lung field: Secondary | ICD-10-CM | POA: Diagnosis present

## 2018-05-24 DIAGNOSIS — Z8679 Personal history of other diseases of the circulatory system: Secondary | ICD-10-CM

## 2018-05-24 DIAGNOSIS — Z21 Asymptomatic human immunodeficiency virus [HIV] infection status: Secondary | ICD-10-CM | POA: Diagnosis present

## 2018-05-24 DIAGNOSIS — J9811 Atelectasis: Secondary | ICD-10-CM | POA: Diagnosis present

## 2018-05-24 DIAGNOSIS — Z7982 Long term (current) use of aspirin: Secondary | ICD-10-CM

## 2018-05-24 DIAGNOSIS — Z9861 Coronary angioplasty status: Secondary | ICD-10-CM | POA: Diagnosis not present

## 2018-05-24 DIAGNOSIS — Z87891 Personal history of nicotine dependence: Secondary | ICD-10-CM | POA: Diagnosis not present

## 2018-05-24 DIAGNOSIS — F419 Anxiety disorder, unspecified: Secondary | ICD-10-CM | POA: Diagnosis present

## 2018-05-24 DIAGNOSIS — N182 Chronic kidney disease, stage 2 (mild): Secondary | ICD-10-CM | POA: Diagnosis present

## 2018-05-24 DIAGNOSIS — I129 Hypertensive chronic kidney disease with stage 1 through stage 4 chronic kidney disease, or unspecified chronic kidney disease: Secondary | ICD-10-CM | POA: Diagnosis present

## 2018-05-24 DIAGNOSIS — Z7902 Long term (current) use of antithrombotics/antiplatelets: Secondary | ICD-10-CM

## 2018-05-24 DIAGNOSIS — K769 Liver disease, unspecified: Secondary | ICD-10-CM | POA: Diagnosis present

## 2018-05-24 DIAGNOSIS — C227 Other specified carcinomas of liver: Secondary | ICD-10-CM | POA: Diagnosis present

## 2018-05-24 DIAGNOSIS — R590 Localized enlarged lymph nodes: Secondary | ICD-10-CM | POA: Diagnosis present

## 2018-05-24 DIAGNOSIS — J189 Pneumonia, unspecified organism: Secondary | ICD-10-CM | POA: Diagnosis present

## 2018-05-24 DIAGNOSIS — G939 Disorder of brain, unspecified: Secondary | ICD-10-CM | POA: Diagnosis present

## 2018-05-24 DIAGNOSIS — I10 Essential (primary) hypertension: Secondary | ICD-10-CM | POA: Diagnosis present

## 2018-05-24 DIAGNOSIS — E233 Hypothalamic dysfunction, not elsewhere classified: Secondary | ICD-10-CM | POA: Diagnosis present

## 2018-05-24 DIAGNOSIS — E222 Syndrome of inappropriate secretion of antidiuretic hormone: Secondary | ICD-10-CM | POA: Diagnosis present

## 2018-05-24 DIAGNOSIS — I700 Atherosclerosis of aorta: Secondary | ICD-10-CM | POA: Diagnosis present

## 2018-05-24 DIAGNOSIS — E042 Nontoxic multinodular goiter: Secondary | ICD-10-CM | POA: Diagnosis present

## 2018-05-24 DIAGNOSIS — Z8249 Family history of ischemic heart disease and other diseases of the circulatory system: Secondary | ICD-10-CM

## 2018-05-24 DIAGNOSIS — G4730 Sleep apnea, unspecified: Secondary | ICD-10-CM | POA: Diagnosis present

## 2018-05-24 DIAGNOSIS — R509 Fever, unspecified: Secondary | ICD-10-CM

## 2018-05-24 DIAGNOSIS — Z9119 Patient's noncompliance with other medical treatment and regimen: Secondary | ICD-10-CM

## 2018-05-24 DIAGNOSIS — F329 Major depressive disorder, single episode, unspecified: Secondary | ICD-10-CM | POA: Diagnosis present

## 2018-05-24 DIAGNOSIS — Z7984 Long term (current) use of oral hypoglycemic drugs: Secondary | ICD-10-CM

## 2018-05-24 DIAGNOSIS — R160 Hepatomegaly, not elsewhere classified: Secondary | ICD-10-CM | POA: Diagnosis not present

## 2018-05-24 DIAGNOSIS — Z955 Presence of coronary angioplasty implant and graft: Secondary | ICD-10-CM

## 2018-05-24 DIAGNOSIS — B20 Human immunodeficiency virus [HIV] disease: Secondary | ICD-10-CM | POA: Diagnosis present

## 2018-05-24 DIAGNOSIS — I2510 Atherosclerotic heart disease of native coronary artery without angina pectoris: Secondary | ICD-10-CM | POA: Diagnosis present

## 2018-05-24 DIAGNOSIS — E1122 Type 2 diabetes mellitus with diabetic chronic kidney disease: Secondary | ICD-10-CM | POA: Diagnosis present

## 2018-05-24 DIAGNOSIS — I480 Paroxysmal atrial fibrillation: Secondary | ICD-10-CM | POA: Diagnosis present

## 2018-05-24 DIAGNOSIS — A419 Sepsis, unspecified organism: Secondary | ICD-10-CM | POA: Diagnosis present

## 2018-05-24 DIAGNOSIS — Z951 Presence of aortocoronary bypass graft: Secondary | ICD-10-CM

## 2018-05-24 DIAGNOSIS — Z9114 Patient's other noncompliance with medication regimen: Secondary | ICD-10-CM

## 2018-05-24 DIAGNOSIS — E871 Hypo-osmolality and hyponatremia: Secondary | ICD-10-CM | POA: Diagnosis not present

## 2018-05-24 DIAGNOSIS — Z79899 Other long term (current) drug therapy: Secondary | ICD-10-CM

## 2018-05-24 DIAGNOSIS — Z8619 Personal history of other infectious and parasitic diseases: Secondary | ICD-10-CM

## 2018-05-24 DIAGNOSIS — Z87442 Personal history of urinary calculi: Secondary | ICD-10-CM

## 2018-05-24 DIAGNOSIS — C3401 Malignant neoplasm of right main bronchus: Secondary | ICD-10-CM | POA: Diagnosis not present

## 2018-05-24 LAB — URINALYSIS, ROUTINE W REFLEX MICROSCOPIC
Bilirubin Urine: NEGATIVE
Glucose, UA: >=500 mg/dL — AB
Hgb urine dipstick: NEGATIVE
Ketones, ur: NEGATIVE mg/dL
Leukocytes, UA: NEGATIVE
NITRITE: NEGATIVE
PH: 6 (ref 5.0–8.0)
Protein, ur: 30 mg/dL — AB
Specific Gravity, Urine: <1.005 — ABNORMAL LOW (ref 1.005–1.030)

## 2018-05-24 LAB — COMPREHENSIVE METABOLIC PANEL
ALT: 109 U/L — ABNORMAL HIGH (ref 0–44)
AST: 128 U/L — ABNORMAL HIGH (ref 15–41)
Albumin: 3.7 g/dL (ref 3.5–5.0)
Alkaline Phosphatase: 476 U/L — ABNORMAL HIGH (ref 38–126)
Anion gap: 11 (ref 5–15)
BUN: 11 mg/dL (ref 6–20)
CO2: 20 mmol/L — ABNORMAL LOW (ref 22–32)
CREATININE: 0.88 mg/dL (ref 0.61–1.24)
Calcium: 9.2 mg/dL (ref 8.9–10.3)
Chloride: 101 mmol/L (ref 98–111)
GFR calc Af Amer: >60 mL/min (ref >60)
GFR calc non Af Amer: >60 mL/min (ref >60)
Glucose, Bld: 149 mg/dL — ABNORMAL HIGH (ref 70–99)
POTASSIUM: 3.9 mmol/L (ref 3.5–5.1)
Sodium: 132 mmol/L — ABNORMAL LOW (ref 135–145)
Total Bilirubin: 1 mg/dL (ref 0.3–1.2)
Total Protein: 7.7 g/dL (ref 6.5–8.1)

## 2018-05-24 LAB — CBC WITH DIFFERENTIAL/PLATELET
Abs Immature Granulocytes: 0.09 10*3/uL — ABNORMAL HIGH (ref 0.00–0.07)
Basophils Absolute: 0.2 10*3/uL — ABNORMAL HIGH (ref 0.0–0.1)
Basophils Relative: 1 %
Eosinophils Absolute: 0.1 10*3/uL (ref 0.0–0.5)
Eosinophils Relative: 1 %
HEMATOCRIT: 48.6 % (ref 39.0–52.0)
Hemoglobin: 15.4 g/dL (ref 13.0–17.0)
Immature Granulocytes: 1 %
Lymphocytes Relative: 24 %
Lymphs Abs: 3.8 10*3/uL (ref 0.7–4.0)
MCH: 26.9 pg (ref 26.0–34.0)
MCHC: 31.7 g/dL (ref 30.0–36.0)
MCV: 85 fL (ref 80.0–100.0)
Monocytes Absolute: 1.2 10*3/uL — ABNORMAL HIGH (ref 0.1–1.0)
Monocytes Relative: 7 %
Neutro Abs: 10.8 10*3/uL — ABNORMAL HIGH (ref 1.7–7.7)
Neutrophils Relative %: 66 %
Platelets: 309 10*3/uL (ref 150–400)
RBC: 5.72 MIL/uL (ref 4.22–5.81)
RDW: 14.7 % (ref 11.5–15.5)
WBC: 16.3 10*3/uL — ABNORMAL HIGH (ref 4.0–10.5)
nRBC: 0 % (ref 0.0–0.2)

## 2018-05-24 LAB — INFLUENZA PANEL BY PCR (TYPE A & B)
Influenza A By PCR: NEGATIVE
Influenza B By PCR: NEGATIVE

## 2018-05-24 LAB — URINALYSIS, MICROSCOPIC (REFLEX): Bacteria, UA: NONE SEEN

## 2018-05-24 LAB — LACTIC ACID, PLASMA
LACTIC ACID, VENOUS: 2.1 mmol/L — AB (ref 0.5–1.9)
Lactic Acid, Venous: 1.5 mmol/L (ref 0.5–1.9)

## 2018-05-24 LAB — LIPASE, BLOOD: Lipase: 63 U/L — ABNORMAL HIGH (ref 11–51)

## 2018-05-24 MED ORDER — METOCLOPRAMIDE HCL 5 MG/ML IJ SOLN: 10.0000 mg | Freq: Once | INTRAMUSCULAR | Status: DC

## 2018-05-24 MED ORDER — ONDANSETRON HCL 4 MG/2ML IJ SOLN
4.0000 mg | Freq: Once | INTRAMUSCULAR | Status: AC
  Administered 2018-05-24: 4 mg via INTRAVENOUS
  Filled 2018-05-24: qty 2

## 2018-05-24 MED ORDER — VANCOMYCIN HCL 1000 MG IV SOLR
INTRAVENOUS | Status: AC
Start: 2018-05-24 — End: 2018-05-25
  Filled 2018-05-24: qty 2000

## 2018-05-24 MED ORDER — SODIUM CHLORIDE 0.9 % IV SOLN
2.0000 g | Freq: Three times a day (TID) | INTRAVENOUS | Status: DC
  Administered 2018-05-25: 2 g via INTRAVENOUS
  Filled 2018-05-24 (×4): qty 2

## 2018-05-24 MED ORDER — METRONIDAZOLE IN NACL 5-0.79 MG/ML-% IV SOLN
500.0000 mg | Freq: Three times a day (TID) | INTRAVENOUS | Status: DC
  Administered 2018-05-24 – 2018-05-25 (×3): 500 mg via INTRAVENOUS
  Filled 2018-05-24 (×3): qty 100

## 2018-05-24 MED ORDER — IOPAMIDOL (ISOVUE-300) INJECTION 61%
100.0000 mL | Freq: Once | INTRAVENOUS | Status: AC | PRN
  Administered 2018-05-24: 100 mL via INTRAVENOUS

## 2018-05-24 MED ORDER — SODIUM CHLORIDE 0.9 % IV BOLUS
500.0000 mL | Freq: Once | INTRAVENOUS | Status: AC
  Administered 2018-05-24: 500 mL via INTRAVENOUS

## 2018-05-24 MED ORDER — FAMOTIDINE IN NACL 20-0.9 MG/50ML-% IV SOLN
20.0000 mg | Freq: Once | INTRAVENOUS | Status: AC
  Administered 2018-05-24: 20 mg via INTRAVENOUS
  Filled 2018-05-24: qty 50

## 2018-05-24 MED ORDER — VANCOMYCIN HCL 10 G IV SOLR
2000.0000 mg | Freq: Once | INTRAVENOUS | Status: AC
  Administered 2018-05-24: 2000 mg via INTRAVENOUS
  Filled 2018-05-24: qty 2000

## 2018-05-24 MED ORDER — ACETAMINOPHEN 325 MG PO TABS
650.0000 mg | ORAL_TABLET | Freq: Once | ORAL | Status: AC | PRN
  Administered 2018-05-24: 650 mg via ORAL
  Filled 2018-05-24: qty 2

## 2018-05-24 MED ORDER — SODIUM CHLORIDE 0.9% FLUSH
3.0000 mL | Freq: Once | INTRAVENOUS | Status: DC
  Filled 2018-05-24: qty 3

## 2018-05-24 MED ORDER — FENTANYL CITRATE (PF) 100 MCG/2ML IJ SOLN
50.0000 ug | Freq: Once | INTRAMUSCULAR | Status: AC
  Administered 2018-05-24: 50 ug via INTRAVENOUS
  Filled 2018-05-24: qty 2

## 2018-05-24 MED ORDER — VANCOMYCIN HCL IN DEXTROSE 1-5 GM/200ML-% IV SOLN: 1000.0000 mg | Freq: Once | INTRAVENOUS | Status: DC

## 2018-05-24 MED ORDER — VANCOMYCIN HCL 10 G IV SOLR
1250.0000 mg | Freq: Two times a day (BID) | INTRAVENOUS | Status: DC
  Administered 2018-05-25: 1250 mg via INTRAVENOUS
  Filled 2018-05-24 (×3): qty 1250

## 2018-05-24 MED ORDER — CEFEPIME HCL 2 G IJ SOLR
INTRAMUSCULAR | Status: AC
Start: 2018-05-24 — End: 2018-05-25
  Filled 2018-05-24: qty 2

## 2018-05-24 MED ORDER — SODIUM CHLORIDE 0.9 % IV SOLN
INTRAVENOUS | Status: DC | PRN
  Administered 2018-05-24: 250 mL via INTRAVENOUS
  Administered 2018-05-25: 500 mL via INTRAVENOUS

## 2018-05-24 MED ORDER — SODIUM CHLORIDE 0.9 % IV SOLN
2.0000 g | Freq: Once | INTRAVENOUS | Status: AC
  Administered 2018-05-24: 2 g via INTRAVENOUS
  Filled 2018-05-24: qty 2

## 2018-05-24 NOTE — ED Notes (Addendum)
(  Third Attempt) Paged Dr. Romilda Garret- step down beds not available at [facility] - Dr. Tamera Punt would like to have patient transferred to [facility] if Dr. Romilda Garret agrees.  Patient is a newly diagnosed cancer patient.  Contacted Bed Control [facility]) - transferred me to Nurse Patty (Patient Placement) - they are going to push it thru to [facility]

## 2018-05-24 NOTE — Progress Notes (Signed)
Pharmacy Antibiotic Note  SEITH AIKEY is a 53 y.o. male admitted on 05/24/2018 with pneumonia   Plan: Cefepime 2 g q8 Vanc 2g x 1 then 1250 mg q12h Flagyl 500 mg q8  Monitor renal fx cx vanc lvls prn     Temp (24hrs), Avg:102 F (38.9 C), Min:99.8 F (37.7 C), Max:104.1 F (40.1 C)  Recent Labs  Lab 05/24/18 1553  WBC 16.3*  CREATININE 0.88  LATICACIDVEN 2.1*    Estimated Creatinine Clearance: 127.7 mL/min (by C-G formula based on SCr of 0.88 mg/dL).    Allergies  Allergen Reactions  . No Known Allergies    Vancomycin 1250 mg IV Q 12 hrs. Goal AUC 400-550. Expected AUC: 461 SCr used: 0.88  Levester Fresh, PharmD, BCPS, BCCCP Clinical Pharmacist [phone]  Please check AMION for all [place] numbers  05/24/2018 4:57 PM

## 2018-05-24 NOTE — ED Notes (Signed)
Patient transported to CT 

## 2018-05-24 NOTE — ED Notes (Signed)
[facility] notified Roselyn Reef) - patient ready for transport

## 2018-05-24 NOTE — ED Notes (Signed)
ED Provider at bedside. 

## 2018-05-24 NOTE — ED Triage Notes (Signed)
Presents with SOB, fatigue, abdominal pain, and migraine. Crackles in left lower lobe. Pt is breathing 32 times per minute.

## 2018-05-24 NOTE — ED Notes (Signed)
Date and time results received: 05/24/18 1622   Test: lactic acid Critical Value: 2.1 Name of Provider Notified: [place] Orders Received? Or Actions Taken?: no orders given

## 2018-05-24 NOTE — ED Notes (Signed)
Per CT protocol, pt cannot complete chest CT as it would be too much contrast for his kidneys. Will have to complete chest CT tomorrow.

## 2018-05-24 NOTE — ED Notes (Signed)
[place] (Admitting MD) for nurse to see if a hospital change is possible.  [facility] does not have step down beds but [facility] does  --
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
## 2018-05-24 NOTE — Plan of Care (Addendum)
[facility] PLAN OF CARE NOTE TRANSFER - ADMISSION  FROM: [facility] (Dr. Roxine Caddy) TO: [facility] (initially slated for [facility] but no stepdown units available)   PATIENT ID/MRN/DOB: [name]  [id number] May 04, 1965     BRIEF SUMMARY: [name] is a 53 y.o. male with HIV (on ART and Cd4 = 27 in 02/2018), CAD s/p CABG and stent, CKD, HTN, and prior migraines who presented to [facility] about 2 weeks ago with frontal migraines. He was discharged from the ED after some improvement in his headache with supportive tx but then returned today to [facility] ED with new cough and dyspnea, as well as persistent migraine. He also reported lower abdominal pain and associated nausea and vomiting. Febrile to 103 deg, tachcyardic, BP stable [id number] systolics; satting well on RA. No meningeal signs per ED provider. Initial CXR concerning for L lingular PNA. CT abd/pelvis with contrast revealed multiple leiver lesions as well as increased bulky lymphadenopathy in mid abdominal vasculature. CT head w/o contrast unremarkable for acute process. CT chest w/contrast is pending at this time and will be postponed for 24 hours given contrast load max limit. He has been started on vanc, cefepime, and flagyl for presumed sepsis.    DDx includes bacterial pneumonia (obstructive possibly due to mets vs HIV related PNA ); meningitis; viral/influenza, other infectious process causing bacteremia. Less likely for tumor lysis syndrome or acute inflammation from underlying tumor given patient has not received any chemotherapy and the degree of fever. There is also a small possibility that the multiple mets are actually infectious/embolic phenomenon, although rare.    PLAN: - admit to stepdown unit ([place]) - continue broad spec abx - IV fluid resuscitation - pan cultures pending - neuro checks and consider LP if worsening headache or new meningeal sx - consider brain MRI as well if worsening neuro sx - CT chest w/contrast tomorrow -  repeat CD4 and HIV RNA testing - if blood cultures positive for bacteria, expand workup to include TTE - contact oncology tomorrow   Colbert Ewing, MD Pager [phone] Go to [email or web address] - password [email or web address] for contact info [facility] - Office  [phone]

## 2018-05-24 NOTE — ED Notes (Signed)
Pt given dt gingerale and dt coke per [employer].

## 2018-05-24 NOTE — ED Notes (Signed)
@  nd attempt --- [place] (Admitting MD) for nurse to see if a hospital change is possible.  [facility] does not have step down beds but [facility] does  --

## 2018-05-25 ENCOUNTER — Inpatient Hospital Stay (HOSPITAL_COMMUNITY): Payer: 59

## 2018-05-25 DIAGNOSIS — R590 Localized enlarged lymph nodes: Secondary | ICD-10-CM | POA: Diagnosis present

## 2018-05-25 DIAGNOSIS — J189 Pneumonia, unspecified organism: Secondary | ICD-10-CM | POA: Diagnosis present

## 2018-05-25 DIAGNOSIS — K769 Liver disease, unspecified: Secondary | ICD-10-CM | POA: Diagnosis present

## 2018-05-25 LAB — GLUCOSE, CAPILLARY
GLUCOSE-CAPILLARY: 115 mg/dL — AB (ref 70–99)
Glucose-Capillary: 139 mg/dL — ABNORMAL HIGH (ref 70–99)
Glucose-Capillary: 150 mg/dL — ABNORMAL HIGH (ref 70–99)
Glucose-Capillary: 108 mg/dL — ABNORMAL HIGH (ref 70–99)
Glucose-Capillary: 138 mg/dL — ABNORMAL HIGH (ref 70–99)

## 2018-05-25 LAB — CBC WITH DIFFERENTIAL/PLATELET
ABS IMMATURE GRANULOCYTES: 0.08 10*3/uL — AB (ref 0.00–0.07)
BASOS ABS: 0.1 10*3/uL (ref 0.0–0.1)
Basophils Relative: 1 %
Eosinophils Absolute: 0 10*3/uL (ref 0.0–0.5)
Eosinophils Relative: 0 %
HCT: 42.8 % (ref 39.0–52.0)
Hemoglobin: 13.5 g/dL (ref 13.0–17.0)
IMMATURE GRANULOCYTES: 1 %
Lymphocytes Relative: 26 %
Lymphs Abs: 3 10*3/uL (ref 0.7–4.0)
MCH: 26.9 pg (ref 26.0–34.0)
MCHC: 31.5 g/dL (ref 30.0–36.0)
MCV: 85.3 fL (ref 80.0–100.0)
Monocytes Absolute: 1.2 10*3/uL — ABNORMAL HIGH (ref 0.1–1.0)
Monocytes Relative: 11 %
NEUTROS ABS: 6.8 10*3/uL (ref 1.7–7.7)
Neutrophils Relative %: 61 %
Platelets: 236 10*3/uL (ref 150–400)
RBC: 5.02 MIL/uL (ref 4.22–5.81)
RDW: 14.7 % (ref 11.5–15.5)
WBC: 11.3 10*3/uL — ABNORMAL HIGH (ref 4.0–10.5)
nRBC: 0 % (ref 0.0–0.2)

## 2018-05-25 LAB — COMPREHENSIVE METABOLIC PANEL
ALT: 83 U/L — AB (ref 0–44)
AST: 84 U/L — AB (ref 15–41)
Albumin: 2.9 g/dL — ABNORMAL LOW (ref 3.5–5.0)
Alkaline Phosphatase: 334 U/L — ABNORMAL HIGH (ref 38–126)
Anion gap: 13 (ref 5–15)
BUN: 9 mg/dL (ref 6–20)
CO2: 18 mmol/L — ABNORMAL LOW (ref 22–32)
CREATININE: 0.93 mg/dL (ref 0.61–1.24)
Calcium: 8.6 mg/dL — ABNORMAL LOW (ref 8.9–10.3)
Chloride: 104 mmol/L (ref 98–111)
GFR calc Af Amer: >60 mL/min (ref >60)
GFR calc non Af Amer: >60 mL/min (ref >60)
Glucose, Bld: 135 mg/dL — ABNORMAL HIGH (ref 70–99)
Potassium: 3.8 mmol/L (ref 3.5–5.1)
Sodium: 135 mmol/L (ref 135–145)
Total Bilirubin: 0.9 mg/dL (ref 0.3–1.2)
Total Protein: 6.4 g/dL — ABNORMAL LOW (ref 6.5–8.1)

## 2018-05-25 LAB — RESPIRATORY PANEL BY PCR

## 2018-05-25 LAB — MRSA PCR SCREENING: MRSA by PCR: NEGATIVE

## 2018-05-25 LAB — STREP PNEUMONIAE URINARY ANTIGEN: STREP PNEUMO URINARY ANTIGEN: NEGATIVE

## 2018-05-25 LAB — PROTIME-INR
INR: 1.08
Prothrombin Time: 13.9 seconds (ref 11.4–15.2)

## 2018-05-25 MED ORDER — IOHEXOL 300 MG/ML  SOLN
75.0000 mL | Freq: Once | INTRAMUSCULAR | Status: AC | PRN
  Administered 2018-05-25: 75 mL via INTRAVENOUS

## 2018-05-25 MED ORDER — SODIUM CHLORIDE 0.9% FLUSH: 3.0000 mL | INTRAVENOUS | Status: DC | PRN

## 2018-05-25 MED ORDER — EMTRICITABINE-TENOFOVIR AF 200-25 MG PO TABS
1.0000 | ORAL_TABLET | Freq: Every day | ORAL | Status: DC
  Administered 2018-05-25 – 2018-06-02 (×9): 1 via ORAL
  Filled 2018-05-25 (×9): qty 1

## 2018-05-25 MED ORDER — SODIUM CHLORIDE 0.9 % IV SOLN
250.0000 mL | INTRAVENOUS | Status: DC | PRN
  Administered 2018-05-29: 250 mL via INTRAVENOUS

## 2018-05-25 MED ORDER — SODIUM CHLORIDE 0.9 % IV SOLN
1.0000 g | INTRAVENOUS | Status: AC
  Administered 2018-05-25 – 2018-05-29 (×5): 1 g via INTRAVENOUS
  Filled 2018-05-25 (×4): qty 1
  Filled 2018-05-25 (×2): qty 10

## 2018-05-25 MED ORDER — LORAZEPAM 1 MG PO TABS
1.0000 mg | ORAL_TABLET | Freq: Once | ORAL | Status: AC | PRN
  Administered 2018-05-25: 1 mg via ORAL
  Filled 2018-05-25: qty 1

## 2018-05-25 MED ORDER — SODIUM CHLORIDE 0.9% FLUSH
3.0000 mL | Freq: Two times a day (BID) | INTRAVENOUS | Status: DC
  Administered 2018-05-25 – 2018-06-02 (×13): 3 mL via INTRAVENOUS

## 2018-05-25 MED ORDER — DOLUTEGRAVIR SODIUM 50 MG PO TABS
50.0000 mg | ORAL_TABLET | Freq: Every day | ORAL | Status: DC
  Administered 2018-05-25 – 2018-06-02 (×9): 50 mg via ORAL
  Filled 2018-05-25 (×9): qty 1

## 2018-05-25 MED ORDER — MORPHINE SULFATE (PF) 2 MG/ML IV SOLN
2.0000 mg | Freq: Once | INTRAVENOUS | Status: DC
  Filled 2018-05-25: qty 1

## 2018-05-25 MED ORDER — SODIUM CHLORIDE 0.9 % IV SOLN
500.0000 mg | INTRAVENOUS | Status: AC
  Administered 2018-05-25 – 2018-05-29 (×5): 500 mg via INTRAVENOUS
  Filled 2018-05-25 (×7): qty 500

## 2018-05-25 MED ORDER — LISINOPRIL 10 MG PO TABS
10.0000 mg | ORAL_TABLET | Freq: Every day | ORAL | Status: DC
  Administered 2018-05-25: 10 mg via ORAL
  Filled 2018-05-25: qty 1

## 2018-05-25 MED ORDER — BUPROPION HCL ER (XL) 300 MG PO TB24
300.0000 mg | ORAL_TABLET | ORAL | Status: DC
  Administered 2018-05-26 – 2018-06-02 (×7): 300 mg via ORAL
  Filled 2018-05-25 (×8): qty 1

## 2018-05-25 MED ORDER — METOPROLOL TARTRATE 25 MG PO TABS
25.0000 mg | ORAL_TABLET | Freq: Two times a day (BID) | ORAL | Status: DC
  Administered 2018-05-25: 25 mg via ORAL
  Filled 2018-05-25: qty 1

## 2018-05-25 MED ORDER — GADOBUTROL 1 MMOL/ML IV SOLN
10.0000 mL | Freq: Once | INTRAVENOUS | Status: AC | PRN
  Administered 2018-05-25: 10 mL via INTRAVENOUS

## 2018-05-25 MED ORDER — INSULIN ASPART 100 UNIT/ML ~~LOC~~ SOLN
0.0000 [IU] | Freq: Three times a day (TID) | SUBCUTANEOUS | Status: DC
  Administered 2018-05-25 – 2018-06-02 (×13): 1 [IU] via SUBCUTANEOUS

## 2018-05-25 MED ORDER — CLOPIDOGREL BISULFATE 75 MG PO TABS
75.0000 mg | ORAL_TABLET | Freq: Every day | ORAL | Status: DC
  Administered 2018-05-25: 75 mg via ORAL
  Filled 2018-05-25: qty 1

## 2018-05-25 MED ORDER — PRAVASTATIN SODIUM 20 MG PO TABS
40.0000 mg | ORAL_TABLET | Freq: Every evening | ORAL | Status: DC
  Administered 2018-05-25 – 2018-06-01 (×8): 40 mg via ORAL
  Filled 2018-05-25 (×5): qty 2
  Filled 2018-05-25: qty 1
  Filled 2018-05-25 (×4): qty 2

## 2018-05-25 MED ORDER — MORPHINE SULFATE (PF) 2 MG/ML IV SOLN
2.0000 mg | Freq: Once | INTRAVENOUS | Status: AC
  Administered 2018-05-25: 2 mg via INTRAVENOUS

## 2018-05-25 MED ORDER — SERTRALINE HCL 50 MG PO TABS
50.0000 mg | ORAL_TABLET | Freq: Every day | ORAL | Status: DC
  Administered 2018-05-25 – 2018-06-02 (×9): 50 mg via ORAL
  Filled 2018-05-25 (×10): qty 1

## 2018-05-25 NOTE — Plan of Care (Signed)
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
## 2018-05-25 NOTE — Progress Notes (Addendum)
Patient arrived to unit via [facility] stable, Ax O x 4 with some shortness of breath with activity on 4LNC.  Flu test negative at [facility] papers placed in chart.  I paged MD for arrival. I will continue to monitor.
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
## 2018-05-25 NOTE — Progress Notes (Addendum)
PROGRESS NOTE    [name]  CHY:[id number] DOB: Mar 15, 1966 DOA: 05/24/2018 PCP: Dylan Orn, MD      Brief Narrative:  [name] is a 53 y.o. M with HIV well controlled and CAD s/p CABG 2012, last stent >8yr who presents with headaches and now malaise, nausea, vomiting and RUQ abdominal pain.  Had been in [place] until about 2 weeks ago, developed new onset severe headaches (hadn't had migraines in years).  Seen in the ER twice two weeks ago (1/18 and 1/19), given IV phenergan, etc and headaches seemed improved.  However in the last week, his symptomology has coalesced to primarily be dyspnea, as well as N/V and RUQ pain.  Seen in ER where lactate 2.1, SIRS+ and CXR showed lingular opacity.  CT abdomen incidentally showed multiple liver lesions and retroperitoneal adenopathy.  Started on broad spectrum antibiotics for SIRS.        Assessment & Plan:  Sepsis from pneumonia Suspected source pneumonia. Organism unknown. Defervesced with fluids.  Cultures negative.  No nuchal rigidity, headache, or photophobia to suggest meningitis.  Normal VL recently.  Patient meets criteria given tachycardia, tachypnea, leukocytosis, and evidence of organ dysfunction.  Lactate 2.1 mmol/L and repeat ordered within 6 hours. RVP negative.  Urinalysis no WBC.  -Blood cultures drawn -Antibiotics: narrow to ceftriaxone and azithromycin    Liver lesions Adenopathy Highly suspicious for cancer unknown primary.  Reviewed imaging with Radiology, abscesses are not considered likely.   -Consult IR -Will need more resolution of sepsis as well as washout Plavix, I favor biopsy as an outpatient -Obtain CT chest with contrast -Obtain MRI brain with and without contrast  HIV -Continue Descovy and Tivicay -Check VL and CD4  Coronary disease secondary prevention HTN -Hold Plavix, aspirin -Continue pravastatin -Hold lisinopril, metoprolol, Lasix until hemodynamics clearer  Diabetes -Hold Jardiance and  metformin -SSI correction insulin ordered  Other medications -Continue sertraline, Wellbutrin              DVT prophylaxis: SCDs Code Status: FULL Family Communication: None present MDM and disposition Plan: This is a no charge note.  For further details, please see H&P by my partner Dr. Shanon Brow from earlier today.  The below labs and imaging reports were reviewed and summarized above.    The patient was admitted with sepsis from pneumonia. Incidental finding of multiple liver lesions.    Objective: Vitals:   05/25/18 0225 05/25/18 0344 05/25/18 0729 05/25/18 1118  BP: 138/89 119/61 132/86 126/74  Pulse: 97 89 86 79  Resp: 17 (!) 31 (!) 22 (!) 27  Temp: 98.9 F (37.2 C) 97.9 F (36.6 C) 98.2 F (36.8 C) 98.1 F (36.7 C)  TempSrc: Oral Oral Oral Oral  SpO2: 96% 95% 91% 93%  Weight: 128.3 kg     Height: 5\' 9"  (1.753 m)       Intake/Output Summary (Last 24 hours) at 05/25/2018 1332 Last data filed at 05/25/2018 1118 Gross per 24 hour  Intake 3392.35 ml  Output 300 ml  Net 3092.35 ml   Filed Weights   05/25/18 0225  Weight: 128.3 kg    Examination: The patient was seen and examined.      Data Reviewed: I have personally reviewed following labs and imaging studies:  CBC: Recent Labs  Lab 05/24/18 1553 05/25/18 0343  WBC 16.3* 11.3*  NEUTROABS 10.8* 6.8  HGB 15.4 13.5  HCT 48.6 42.8  MCV 85.0 85.3  PLT 309 878   Basic Metabolic Panel: Recent Labs  Lab 05/24/18 1553 05/25/18 0343  NA 132* 135  K 3.9 3.8  CL 101 104  CO2 20* 18*  GLUCOSE 149* 135*  BUN 11 9  CREATININE 0.88 0.93  CALCIUM 9.2 8.6*   GFR: Estimated Creatinine Clearance: 120.3 mL/min (by C-G formula based on SCr of 0.93 mg/dL). Liver Function Tests: Recent Labs  Lab 05/24/18 1553 05/25/18 0343  AST 128* 84*  ALT 109* 83*  ALKPHOS 476* 334*  BILITOT 1.0 0.9  PROT 7.7 6.4*  ALBUMIN 3.7 2.9*   Recent Labs  Lab 05/24/18 1553  LIPASE 63*   No results for input(s):  AMMONIA in the last 168 hours. Coagulation Profile: Recent Labs  Lab 05/25/18 1059  INR 1.08   Cardiac Enzymes: No results for input(s): CKTOTAL, CKMB, CKMBINDEX, TROPONINI in the last 168 hours. BNP (last 3 results) No results for input(s): PROBNP in the last 8760 hours. HbA1C: No results for input(s): HGBA1C in the last 72 hours. CBG: Recent Labs  Lab 05/25/18 0248 05/25/18 0731 05/25/18 1121  GLUCAP 139* 115* 150*   Lipid Profile: No results for input(s): CHOL, HDL, LDLCALC, TRIG, CHOLHDL, LDLDIRECT in the last 72 hours. Thyroid Function Tests: No results for input(s): TSH, T4TOTAL, FREET4, T3FREE, THYROIDAB in the last 72 hours. Anemia Panel: No results for input(s): VITAMINB12, FOLATE, FERRITIN, TIBC, IRON, RETICCTPCT in the last 72 hours. Urine analysis:    Component Value Date/Time   COLORURINE YELLOW 05/24/2018 2130   APPEARANCEUR CLEAR 05/24/2018 2130   LABSPEC <1.005 (L) 05/24/2018 2130   PHURINE 6.0 05/24/2018 2130   GLUCOSEU >=500 (A) 05/24/2018 2130   HGBUR NEGATIVE 05/24/2018 2130   BILIRUBINUR NEGATIVE 05/24/2018 2130   [place] NEGATIVE 05/24/2018 2130   PROTEINUR 30 (A) 05/24/2018 2130   UROBILINOGEN 1.0 06/29/2014 2245   NITRITE NEGATIVE 05/24/2018 2130   LEUKOCYTESUR NEGATIVE 05/24/2018 2130   Sepsis Labs: @LABRCNTIP (procalcitonin:4,lacticacidven:4)  ) Recent Results (from the past 240 hour(s))  Blood Culture (routine x 2)     Status: None (Preliminary result)   Collection Time: 05/24/18  4:06 PM  Result Value Ref Range Status   Specimen Description   Final    BLOOD RIGHT ANTECUBITAL Performed at [facility], [address] [place], [place] [address]    Special Requests   Final    BOTTLES DRAWN AEROBIC AND ANAEROBIC Blood Culture adequate volume Performed at [facility], [address] [place], [place] [address]    Culture   Final    NO GROWTH < 12 HOURS Performed at [facility], [place] [address],  [place], [place] [address]    Report Status PENDING  Incomplete  Blood Culture (routine x 2)     Status: None (Preliminary result)   Collection Time: 05/24/18  4:45 PM  Result Value Ref Range Status   Specimen Description   Final    BLOOD BLOOD RIGHT HAND Performed at [facility], [address] [place], [place] [address]    Special Requests   Final    BOTTLES DRAWN AEROBIC AND ANAEROBIC Blood Culture adequate volume Performed at [facility], [address] [place], [place] [address]    Culture   Final    NO GROWTH < 12 HOURS Performed at [facility], [place] [address] [place], [place] [address]    Report Status PENDING  Incomplete  Respiratory Panel by PCR     Status: None   Collection Time: 05/24/18  6:49 PM  Result Value Ref Range Status   Adenovirus NOT DETECTED NOT DETECTED Final   Coronavirus 229E NOT DETECTED NOT DETECTED Final   Coronavirus HKU1 NOT DETECTED NOT DETECTED Final   Coronavirus NL63 NOT DETECTED NOT DETECTED Final   Coronavirus OC43 NOT DETECTED NOT DETECTED Final   Metapneumovirus NOT DETECTED NOT DETECTED Final   Rhinovirus / Enterovirus NOT DETECTED NOT DETECTED Final   Influenza A NOT DETECTED NOT DETECTED Final   Influenza B NOT DETECTED NOT DETECTED Final   Parainfluenza Virus 1 NOT DETECTED NOT DETECTED Final   Parainfluenza Virus 2 NOT DETECTED NOT DETECTED Final   Parainfluenza Virus 3 NOT DETECTED NOT DETECTED Final   Parainfluenza Virus 4 NOT DETECTED NOT DETECTED Final   Respiratory Syncytial Virus NOT DETECTED NOT DETECTED Final   Bordetella pertussis NOT DETECTED NOT DETECTED Final   Chlamydophila pneumoniae NOT DETECTED NOT DETECTED Final   Mycoplasma pneumoniae NOT DETECTED NOT DETECTED Final    Comment: Performed at [facility], [place] [address] [place], [place] [address]  MRSA PCR Screening     Status: None   Collection Time: 05/25/18  2:22 AM  Result Value Ref Range Status   MRSA by PCR NEGATIVE NEGATIVE  Final    Comment:        The GeneXpert MRSA Assay (FDA approved for NASAL specimens only), is one component of a comprehensive MRSA colonization surveillance program. It is not intended to diagnose MRSA infection nor to guide or monitor treatment for MRSA infections. Performed at [facility], [place] [address] [place], [place] [address]          Radiology Studies: Dg Chest 2 View  Result Date: 05/24/2018 CLINICAL DATA:  Nausea and vomiting for 2 weeks. Shortness of breath and fever for 1 week. EXAM: CHEST - 2 VIEW COMPARISON:  PA and lateral chest 10/08/2017, 02/18/2011 and 09/02/2016. CT chest 09/02/2016. FINDINGS: There is peribronchial thickening. Increased density projects over the left heart on the frontal view. No pneumothorax pleural effusion. Heart size is normal. The patient is status post CABG. Aortic atherosclerosis is noted. IMPRESSION: Increased density over the left heart is worrisome for lingular pneumonia. Recommend repeat PA and lateral films in 4-6 weeks to ensure resolution. Bronchitic change. Electronically Signed   By: Inge Rise M.D.   On: 05/24/2018 16:21   Ct Head Wo Contrast  Result Date: 05/24/2018 CLINICAL DATA:  Headache and nausea. EXAM: CT HEAD WITHOUT CONTRAST TECHNIQUE: Contiguous axial images were obtained from the base of the skull through the vertex without intravenous contrast. COMPARISON:  05/09/2018 FINDINGS: Brain: there is no evidence for acute hemorrhage, hydrocephalus, mass lesion, or abnormal extra-axial fluid collection. No definite CT evidence for acute infarction. Vascular: No hyperdense vessel or unexpected calcification. Skull: Normal. Negative for fracture or focal lesion. Sinuses/Orbits: No acute finding. Other: None. IMPRESSION: No acute intracranial abnormality. Electronically Signed   By: Misty Stanley M.D.   On: 05/24/2018 17:50   Ct Abdomen Pelvis W Contrast  Result Date: 05/24/2018 CLINICAL DATA:  Nausea vomiting and  abdominal pain EXAM: CT ABDOMEN AND PELVIS WITH CONTRAST TECHNIQUE: Multidetector CT imaging of the abdomen and pelvis was performed using the standard protocol following bolus administration of intravenous contrast. CONTRAST:  162mL ISOVUE-300 IOPAMIDOL (ISOVUE-300) INJECTION 61% COMPARISON:  06/29/2014 FINDINGS: Lower chest: 3.7 cm short axis subcarinal lymph node is associated with bulky lymphadenopathy in the left hilum and nodular airspace opacity left lower lobe measuring up to 3.8 cm.  Tiny left pleural effusion associated. Hepatobiliary: Liver shows innumerable ill-defined low-density lesions consistent with metastatic disease. Dominant lesion in the inferior right liver measures 5.6 cm (49/2). Index lesion posterior right liver measures 3.6 cm on 30 /2. There is no evidence for gallstones, gallbladder wall thickening, or pericholecystic fluid. No intrahepatic or extrahepatic biliary dilation. Pancreas: No focal mass lesion. No dilatation of the main duct. No intraparenchymal cyst. No peripancreatic edema. Spleen: No splenomegaly. No focal mass lesion. Adrenals/Urinary Tract: No adrenal nodule or mass. 15 mm exophytic low-density lesion lower pole right kidney is probably a cyst. 3.7 cm lesion interpolar left kidney approaches water attenuation, also likely a cyst. No evidence for hydroureter. The urinary bladder appears normal for the degree of distention. Stomach/Bowel: Stomach is unremarkable. No gastric wall thickening. No evidence of outlet obstruction. Duodenum is normally positioned as is the ligament of Treitz. No small bowel wall thickening. No small bowel dilatation. The terminal ileum is normal. The appendix is normal. No gross colonic mass. No colonic wall thickening. Vascular/Lymphatic: There is abdominal aortic atherosclerosis without aneurysm. Bulky lymphadenopathy is seen in the hepato duodenal ligament. Index 2.9 cm short axis lymph node identified on 39/2. 2.5 cm short axis pre caval lymph  node visible on 42/2. Para-aortic lymphadenopathy is associated with 1.4 cm short axis aortocaval node visible on 50/2. No pelvic sidewall lymphadenopathy. Reproductive: The prostate gland and seminal vesicles are unremarkable. Other: No intraperitoneal free fluid. Musculoskeletal: Sclerotic lesion in the left femoral neck is stable since prior. No worrisome lytic or sclerotic osseous abnormality. IMPRESSION: 1. Innumerable ill-defined low-density liver lesions consistent with metastatic disease. 2. Bulky lymphadenopathy in the hepato duodenal ligament with para-aortic lymphadenopathy in the retroperitoneal space of the abdomen, findings consistent with metastatic involvement. 3. Incompletely visualized but markedly enlarged subcarinal lymph node is associated with apparent bulky left hilar lymphadenopathy and airspace disease in the left lower lobe. Dedicated CT chest with contrast material recommended to further evaluate. 4. Bilateral renal cysts. 5.  Aortic Atherosclerois (ICD10-170.0) Electronically Signed   By: Misty Stanley M.D.   On: 05/24/2018 17:46        Scheduled Meds: . emtricitabine-tenofovir AF  1 tablet Oral Daily  . insulin aspart  0-9 Units Subcutaneous TID WC  . lisinopril  10 mg Oral Daily  . metoprolol tartrate  25 mg Oral BID  . sodium chloride flush  3 mL Intravenous Q12H   Continuous Infusions: . sodium chloride    . azithromycin    . cefTRIAXone (ROCEPHIN)  IV       LOS: 1 day    Time spent: 35 minuutes    Edwin Dada, MD [facility] 05/25/2018, 1:32 PM     Pager [phone] --- please page though AMION:  [email or web address] If 7PM-7AM, please contact night-coverage

## 2018-05-25 NOTE — ED Notes (Signed)
Pt requesting pain meds, notified EDP, no new orders received.

## 2018-05-25 NOTE — Progress Notes (Signed)
I responded to a [place] to provide Advance Directive information for the patient. I visited the patient's room and delivered the AD information. Patient was exhausted. I left the AD for him to review and complete at a later time.    05/25/18 1300  Clinical Encounter Type  Visited With Patient  Visit Type Spiritual support  Referral From Nurse  Consult/Referral To Chaplain  Spiritual Encounters  Spiritual Needs Prayer;Literature  Stress Factors  Patient Stress Factors Exhausted    Chaplain Dr Redgie Grayer

## 2018-05-26 ENCOUNTER — Ambulatory Visit
Admit: 2018-05-26 | Discharge: 2018-05-26 | Disposition: A | Discharge status: Home / Self Care | Payer: 59 | Attending: Radiation Oncology | Admitting: Radiation Oncology

## 2018-05-26 DIAGNOSIS — R918 Other nonspecific abnormal finding of lung field: Secondary | ICD-10-CM

## 2018-05-26 DIAGNOSIS — A419 Sepsis, unspecified organism: Principal | ICD-10-CM

## 2018-05-26 DIAGNOSIS — Z8679 Personal history of other diseases of the circulatory system: Secondary | ICD-10-CM

## 2018-05-26 DIAGNOSIS — C3432 Malignant neoplasm of lower lobe, left bronchus or lung: Secondary | ICD-10-CM | POA: Insufficient documentation

## 2018-05-26 DIAGNOSIS — Z9861 Coronary angioplasty status: Secondary | ICD-10-CM

## 2018-05-26 DIAGNOSIS — N182 Chronic kidney disease, stage 2 (mild): Secondary | ICD-10-CM

## 2018-05-26 DIAGNOSIS — R590 Localized enlarged lymph nodes: Secondary | ICD-10-CM

## 2018-05-26 DIAGNOSIS — I10 Essential (primary) hypertension: Secondary | ICD-10-CM

## 2018-05-26 DIAGNOSIS — C3401 Malignant neoplasm of right main bronchus: Secondary | ICD-10-CM

## 2018-05-26 DIAGNOSIS — B20 Human immunodeficiency virus [HIV] disease: Secondary | ICD-10-CM

## 2018-05-26 DIAGNOSIS — Z510 Encounter for antineoplastic radiation therapy: Secondary | ICD-10-CM | POA: Insufficient documentation

## 2018-05-26 DIAGNOSIS — K769 Liver disease, unspecified: Secondary | ICD-10-CM

## 2018-05-26 DIAGNOSIS — I480 Paroxysmal atrial fibrillation: Secondary | ICD-10-CM

## 2018-05-26 DIAGNOSIS — I2510 Atherosclerotic heart disease of native coronary artery without angina pectoris: Secondary | ICD-10-CM

## 2018-05-26 DIAGNOSIS — E119 Type 2 diabetes mellitus without complications: Secondary | ICD-10-CM

## 2018-05-26 DIAGNOSIS — Z87891 Personal history of nicotine dependence: Secondary | ICD-10-CM

## 2018-05-26 LAB — GLUCOSE, CAPILLARY
GLUCOSE-CAPILLARY: 129 mg/dL — AB (ref 70–99)
Glucose-Capillary: 132 mg/dL — ABNORMAL HIGH (ref 70–99)
Glucose-Capillary: 134 mg/dL — ABNORMAL HIGH (ref 70–99)
Glucose-Capillary: 124 mg/dL — ABNORMAL HIGH (ref 70–99)
Glucose-Capillary: 124 mg/dL — ABNORMAL HIGH (ref 70–99)

## 2018-05-26 LAB — T-HELPER CELLS (CD4) COUNT (NOT AT ARMC)
CD4 % Helper T Cell: 12 % — ABNORMAL LOW (ref 33–55)
CD4 T Cell Abs: 370 /uL — ABNORMAL LOW (ref 400–2700)

## 2018-05-26 LAB — BASIC METABOLIC PANEL
Anion gap: 9 (ref 5–15)
BUN: 10 mg/dL (ref 6–20)
CO2: 21 mmol/L — ABNORMAL LOW (ref 22–32)
Calcium: 8.4 mg/dL — ABNORMAL LOW (ref 8.9–10.3)
Chloride: 103 mmol/L (ref 98–111)
Creatinine, Ser: 0.78 mg/dL (ref 0.61–1.24)
GFR calc Af Amer: >60 mL/min (ref >60)
GFR calc non Af Amer: >60 mL/min (ref >60)
Glucose, Bld: 121 mg/dL — ABNORMAL HIGH (ref 70–99)
Potassium: 3.5 mmol/L (ref 3.5–5.1)
Sodium: 133 mmol/L — ABNORMAL LOW (ref 135–145)

## 2018-05-26 LAB — CBC
HCT: 42.1 % (ref 39.0–52.0)
Hemoglobin: 13.3 g/dL (ref 13.0–17.0)
MCH: 26.6 pg (ref 26.0–34.0)
MCHC: 31.6 g/dL (ref 30.0–36.0)
MCV: 84.2 fL (ref 80.0–100.0)
Platelets: 238 10*3/uL (ref 150–400)
RBC: 5 MIL/uL (ref 4.22–5.81)
RDW: 14.6 % (ref 11.5–15.5)
WBC: 9.8 10*3/uL (ref 4.0–10.5)
nRBC: 0 % (ref 0.0–0.2)

## 2018-05-26 MED ORDER — ONDANSETRON HCL 4 MG/2ML IJ SOLN
4.0000 mg | Freq: Four times a day (QID) | INTRAMUSCULAR | Status: DC | PRN
  Administered 2018-05-26 – 2018-06-01 (×7): 4 mg via INTRAVENOUS
  Filled 2018-05-26 (×7): qty 2

## 2018-05-26 MED ORDER — OXYCODONE HCL 5 MG PO TABS
5.0000 mg | ORAL_TABLET | ORAL | Status: DC | PRN
  Administered 2018-06-01 – 2018-06-02 (×2): 5 mg via ORAL
  Filled 2018-05-26 (×2): qty 1

## 2018-05-26 MED ORDER — PROMETHAZINE HCL 25 MG PO TABS
25.0000 mg | ORAL_TABLET | Freq: Four times a day (QID) | ORAL | Status: DC | PRN
  Administered 2018-05-27 – 2018-05-30 (×5): 25 mg via ORAL
  Filled 2018-05-26 (×5): qty 1

## 2018-05-26 MED ORDER — PROMETHAZINE HCL 25 MG RE SUPP
25.0000 mg | Freq: Four times a day (QID) | RECTAL | Status: DC | PRN
  Filled 2018-05-26: qty 1

## 2018-05-26 MED ORDER — PROMETHAZINE HCL 25 MG/ML IJ SOLN
12.5000 mg | Freq: Four times a day (QID) | INTRAMUSCULAR | Status: DC | PRN
  Administered 2018-05-30: 12.5 mg via INTRAVENOUS
  Filled 2018-05-26: qty 1

## 2018-05-26 MED ORDER — METOPROLOL TARTRATE 25 MG PO TABS
25.0000 mg | ORAL_TABLET | Freq: Two times a day (BID) | ORAL | Status: DC
  Administered 2018-05-26 – 2018-06-02 (×15): 25 mg via ORAL
  Filled 2018-05-26 (×16): qty 1

## 2018-05-26 MED ORDER — ZOLPIDEM TARTRATE 5 MG PO TABS
5.0000 mg | ORAL_TABLET | Freq: Every evening | ORAL | Status: DC | PRN
  Administered 2018-05-26 – 2018-06-01 (×6): 10 mg via ORAL
  Filled 2018-05-26 (×7): qty 2

## 2018-05-26 NOTE — Progress Notes (Signed)
Dr. Loleta Books talked patient regarding the result of CT of chest. 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  Pt has PIV on Rt. FA with 20 G and Gave a report to Therapist, sports at [employer]. Explained to patient regarding room number 1613, 6E. Pt is transferring to [facility] long for oncology treatment. HS [employer]

## 2018-05-26 NOTE — Progress Notes (Signed)
PROGRESS NOTE    [name]  DZH:[id number] DOB: 1966/01/21 DOA: 05/24/2018 PCP: Lavone Orn, MD      Brief Narrative:  [name] is a 53 y.o. M with HIV well controlled and CAD s/p CABG 2012, last stent >91yrwho presents with headaches and now malaise, nausea, vomiting and RUQ abdominal pain.  Had been in [place] about [place] weeks ago, developed new onset severe headaches (hadn't had migraines in years).  Seen in the ER twice two weeks ago (1/18 and 1/19), given IV phenergan, etc and headaches seemed improved.  However in the last week, his symptomology has coalesced to primarily be dyspnea, as well as N/V and RUQ pain.  Seen in ER where lactate 2.1, SIRS+ and CXR showed lingular opacity.  CT abdomen incidentally showed multiple liver lesions and retroperitoneal adenopathy.  Started on broad spectrum antibiotics for SIRS.        Assessment & Plan:  Sepsis from pneumonia Suspected source pneumonia. Organism unknown. Cultures negative.  No nuchal rigidity, headache, or photophobia to suggest meningitis.    Patient met criteria given tachycardia, tachypnea, leukocytosis, and evidence of organ dysfunction.  Lactate 2.1 mmol/L and washed out with fluids.  RVP negative.  Urinalysis no WBC.  CT chest report suggests left lung airspace opacities seen on CXR may be lymphangitis spread, discussed with Oncology who felt associated pneumonia was likely  -Continue Ceftriaxone and azithromycin -Follow culture data     Lung mass Liver lesions Adenopathy Calavarial lesions Hypothalamic infundibulum thickening CT shows right hilar lung mass with local invasion.  MRI brain shows calavarial lesions and changes to hypothalamus.    Discussed with Onc, Liver will be primary target for biopsy.  Infundibulum finding nonspecific, will discuss with Neuro-Onc.   The lung may need irradiation, would [facility], Radiation [facility]cology [facility] reports he has not been taking his ART recently, unfortunately.   -Check VL and CD4 -Continue Descovy and Tivicay  Coronary disease secondary prevention HTN -Hold Plavix, aspirin for Bx, earliest Monday  -Continue pravastatin -Restart metoprolol -Hold lisinopril, Lasix until hemodynamics clearer  Diabetes Glucoses good -Hold Jardiance and metformin -Continue SSI  Other medications -Continue sertraline, Wellbutrin              DVT prophylaxis: SCDs Code Status: FULL Family Communication: Sister by phone MDM and disposition Plan: The below labs and imaging reports reviewed and summarized above.  Medication management as above medications discussed with oncology, radiation oncology, and radiology.  The patient was admitted with sepsis from pneumonia, and is defervescing.  Still with RR >23 and HR > 90.  No more fever, off O2.    He had an incidental finding of multiple liver lesions, as well as a new lung mass, and apparent spread to the skull, locally within the lung, and liver.  For that he may need radiation therapy during this hospitalization.  [facility] with onc[facility], radiation oncology, neuro-oncology.   Due to Plavix, his biopsy of liver cannot be completed until Monday at the earliest.     If tissue diagnosis is necessary before radiation, and If he stabilizes medically, will likely be discharged Weds or Thurs with PO antibiotics over the weekend; under those circumstances, IR will need to be called to arranged outpatient liver biopsy for Monday off Plavix.    If he he does not progress clinically or if radiation therapy is needed  before tissue diagnosis, we will continue inpatient care and he will have his biopsy in house on Monday.          Objective: Vitals:   05/25/18 1933 05/25/18 2348 05/26/18 0423 05/26/18 0745  BP: (!) 153/85 (!) 152/77 (!) 157/91 132/90  Pulse: 85 87 94 92  Resp: (!)  31 19 (!) 23 20  Temp: 98 F (36.7 C) 98 F (36.7 C) 97.9 F (36.6 C) 98.5 F (36.9 C)  TempSrc: Oral Oral Oral Oral  SpO2: 94% 95% 97% 95%  Weight:      Height:        Intake/Output Summary (Last 24 hours) at 05/26/2018 1107 Last data filed at 05/26/2018 0800 Gross per 24 hour  Intake 940 ml  Output 700 ml  Net 240 ml   Filed Weights   05/25/18 0225  Weight: 128.3 kg    Examination: BP 132/90 (BP Location: Left Arm)   Pulse 92   Temp 98.5 F (36.9 C) (Oral)   Resp 20   Ht _0  (1.753 m)   Wt 128.3 kg   SpO2 95%   BMI 41.76 kg/m   General: Adult male, appears tired and sweaty, lying in bed, uncomfortable.  Alert and interactive.    HEENT: Corneas clear, conjunctivae and sclerae normal without injection or icterus, lids and lashes normal.  Visual tracking smooth.  OP moist without lesions.  Hearing normal, lips and dentition normal.  Cardiac: Tachycardic, regular, nl S1-S2, no murmurs, rubs, gallops.  Capillary refill is less than 2 seconds.  No lower extremity edema. Respiratory: Normal respiratory rate and rhythm.  Diminished bilaterally, no focal rales or wheezes.  Extremities: No deformities/injuries.  5/5 grip strength and upper extremity flexion/extension, symmetrically.  Extremities are warm and well-perfused. Neuro: Sensorium intact.  Speech is fluent.  Naming is grossly intact, and the patient's recall, recent and remote, as well as general fund of knowledge seem within normal limits.  Muscle tone normal, without fasciculations.  Moves all extremities equally and with normal coordination.  Attention span and concentration are within normal limits.  Psych: Affect shocked.  Normal rate and rhythm of speech.  Thought content appropriate, and thought process linear.   Attention and concentration are normal.       Data Reviewed: I have personally reviewed following labs and imaging studies:  CBC: Recent Labs  Lab 05/24/18 1553 05/25/18 0343 05/26/18 0313  WBC  16.3* 11.3* 9.8  NEUTROABS 10.8* 6.8  --   HGB 15.4 13.5 13.3  HCT 48.6 42.8 42.1  MCV 85.0 85.3 84.2  PLT 309 236 810   Basic Metabolic Panel: Recent Labs  Lab 05/24/18 1553 05/25/18 0343 05/26/18 0313  NA 132* 135 133*  K 3.9 3.8 3.5  CL 101 104 103  CO2 20* 18* 21*  GLUCOSE 149* 135* 121*  BUN _1 CREATININE 0.88 0.93 0.78  CALCIUM 9.2 8.6* 8.4*   GFR: Estimated Creatinine Clearance: 139.9 mL/min (by C-G formula based on SCr of 0.78 mg/dL). Liver Function Tests: Recent Labs  Lab 05/24/18 1553 05/25/18 0343  AST 128* 84*  ALT 109* 83*  ALKPHOS 476* 334*  BILITOT 1.0 0.9  PROT 7.7 6.4*  ALBUMIN 3.7 2.9*   Recent Labs  Lab 05/24/18 1553  LIPASE 63*   No results for input(s): AMMONIA in the last 168 hours. Coagulation Profile: Recent Labs  Lab 05/25/18 1059  INR 1.08   Cardiac Enzymes: No results for input(s): CKTOTAL, CKMB, CKMBINDEX, TROPONINI in  the last 168 hours. BNP (last 3 results) No results for input(s): PROBNP in the last 8760 hours. HbA1C: No results for input(s): HGBA1C in the last 72 hours. CBG: Recent Labs  Lab 05/25/18 0731 05/25/18 1121 05/25/18 1731 05/25/18 2104 05/26/18 0741  GLUCAP 115* 150* 108* 138* 132*   Lipid Profile: No results for input(s): CHOL, HDL, LDLCALC, TRIG, CHOLHDL, LDLDIRECT in the last 72 hours. Thyroid Function Tests: No results for input(s): TSH, T4TOTAL, FREET4, T3FREE, THYROIDAB in the last 72 hours. Anemia Panel: No results for input(s): VITAMINB12, FOLATE, FERRITIN, TIBC, IRON, RETICCTPCT in the last 72 hours. Urine analysis:    Component Value Date/Time   COLORURINE YELLOW 05/24/2018 2130   APPEARANCEUR CLEAR 05/24/2018 2130   LABSPEC <1.005 (L) 05/24/2018 2130   PHURINE 6.0 05/24/2018 2130   GLUCOSEU >=500 (A) 05/24/2018 2130   HGBUR NEGATIVE 05/24/2018 2130   BILIRUBINUR NEGATIVE 05/24/2018 2130   KETONESUR NEGATIVE 05/24/2018 2130   PROTEINUR 30 (A) 05/24/2018 2130   UROBILINOGEN 1.0  06/29/2014 2245   NITRITE NEGATIVE 05/24/2018 2130   LEUKOCYTESUR NEGATIVE 05/24/2018 2130   Sepsis Labs: _0 (procalcitonin:4,lacticacidven:4)  ) Recent Results (from the past 240 hour(s))  Blood Culture (routine x 2)     Status: None (Preliminary result)   Collection Time: 05/24/18  4:06 PM  Result Value Ref Range Status   Specimen Description   Final    BLOOD RIGHT ANTECUBITAL Performed at [facility], [address] [place] [address]    Special Requests   Final    BOTTLES DRAWN AEROBIC AND ANAEROBIC Blood Culture adequate volume Performed at [facility], [address] [place], [place] [address]    Culture   Final    NO GROWTH 2 DAYS Performed at [facility], [place] [address], [place], [place] [address]    Report Status PENDING  Incomplete  Blood Culture (routine x 2)     Status: None (Preliminary result)   Collection Time: 05/24/18  4:45 PM  Result Value Ref Range Status   Specimen Description   Final    BLOOD BLOOD RIGHT HAND Performed at [facility], [address] [place], [place] [address]    Special Requests   Final    BOTTLES DRAWN AEROBIC AND ANAEROBIC Blood Culture adequate volume Performed at [facility], [address] [place], [place] [address]    Culture   Final    NO GROWTH 2 DAYS Performed at [facility], [place] [address], [place], [place] [address]    Report Status PENDING  Incomplete  Respiratory Panel by PCR     Status: None   Collection Time: 05/24/18  6:49 PM  Result Value Ref Range Status   Adenovirus NOT DETECTED NOT DETECTED Final   Coronavirus 229E NOT DETECTED NOT DETECTED Final   Coronavirus HKU1 NOT DETECTED NOT DETECTED Final   Coronavirus NL63 NOT DETECTED NOT DETECTED Final   Coronavirus OC43 NOT DETECTED NOT DETECTED Final   Metapneumovirus NOT DETECTED NOT DETECTED Final   Rhinovirus / Enterovirus NOT DETECTED NOT DETECTED Final   Influenza A NOT DETECTED  NOT DETECTED Final   Influenza B NOT DETECTED NOT DETECTED Final   Parainfluenza Virus 1 NOT DETECTED NOT DETECTED Final   Parainfluenza Virus 2 NOT DETECTED NOT DETECTED Final   Parainfluenza Virus 3 NOT DETECTED NOT DETECTED Final   Parainfluenza Virus 4 NOT DETECTED NOT DETECTED Final   Respiratory  Syncytial Virus NOT DETECTED NOT DETECTED Final   Bordetella pertussis NOT DETECTED NOT DETECTED Final   Chlamydophila pneumoniae NOT DETECTED NOT DETECTED Final   Mycoplasma pneumoniae NOT DETECTED NOT DETECTED Final    Comment: Performed at [facility], [place] [address], [place], [place] [address]  MRSA PCR Screening     Status: None   Collection Time: 05/25/18  2:22 AM  Result Value Ref Range Status   MRSA by PCR NEGATIVE NEGATIVE Final    Comment:        The GeneXpert MRSA Assay (FDA approved for NASAL specimens only), is one component of a comprehensive MRSA colonization surveillance program. It is not intended to diagnose MRSA infection nor to guide or monitor treatment for MRSA infections. Performed at [facility], [place] [address], [place], [place] [address]   Culture, blood (routine x 2) Call MD if unable to obtain prior to antibiotics being given     Status: None (Preliminary result)   Collection Time: 05/25/18  3:43 AM  Result Value Ref Range Status   Specimen Description BLOOD RIGHT WRIST  Final   Special Requests   Final    BOTTLES DRAWN AEROBIC AND ANAEROBIC Blood Culture adequate volume   Culture   Final    NO GROWTH 1 DAY Performed at [facility], [place] [address], [place], [place] [address]    Report Status PENDING  Incomplete  Culture, blood (routine x 2) Call MD if unable to obtain prior to antibiotics being given     Status: None (Preliminary result)   Collection Time: 05/25/18  3:46 AM  Result Value Ref Range Status   Specimen Description BLOOD LEFT ANTECUBITAL  Final   Special Requests   Final    BOTTLES DRAWN AEROBIC AND ANAEROBIC Blood  Culture adequate volume   Culture   Final    NO GROWTH 1 DAY Performed at [facility], [address] [place],  [address]    Report Status PENDING  Incomplete         Radiology Studies: Dg Chest 2 View  Result Date: 05/24/2018 CLINICAL DATA:  Nausea and vomiting for 2 weeks. Shortness of breath and fever for 1 week. EXAM: CHEST - 2 VIEW COMPARISON:  PA and lateral chest 10/08/2017, 02/18/2011 and 09/02/2016. CT chest 09/02/2016. FINDINGS: There is peribronchial thickening. Increased density projects over the left heart on the frontal view. No pneumothorax pleural effusion. Heart size is normal. The patient is status post CABG. Aortic atherosclerosis is noted. IMPRESSION: Increased density over the left heart is worrisome for lingular pneumonia. Recommend repeat PA and lateral films in 4-6 weeks to ensure resolution. Bronchitic change. Electronically Signed   By: Inge Rise M.D.   On: 05/24/2018 16:21   Ct Head Wo Contrast  Result Date: 05/24/2018 CLINICAL DATA:  Headache and nausea. EXAM: CT HEAD WITHOUT CONTRAST TECHNIQUE: Contiguous axial images were obtained from the base of the skull through the vertex without intravenous contrast. COMPARISON:  05/09/2018 FINDINGS: Brain: there is no evidence for acute hemorrhage, hydrocephalus, mass lesion, or abnormal extra-axial fluid collection. No definite CT evidence for acute infarction. Vascular: No hyperdense vessel or unexpected calcification. Skull: Normal. Negative for fracture or focal lesion. Sinuses/Orbits: No acute finding. Other: None. IMPRESSION: No acute intracranial abnormality. Electronically Signed   By: Misty Stanley M.D.   On: 05/24/2018 17:50   Ct Chest W Contrast  Result Date: 05/25/2018 CLINICAL DATA:  53 year old male with shortness of breath,  fever, cough nausea and vomiting. History of HIV. Numerous liver lesion suspected to be metastases on CT Abdomen and Pelvis yesterday with abnormal left lung base. EXAM:  CT CHEST WITH CONTRAST TECHNIQUE: Multidetector CT imaging of the chest was performed during intravenous contrast administration. CONTRAST:  53m OMNIPAQUE IOHEXOL 300 MG/ML  SOLN COMPARISON:  05/24/2018 chest radiographs.  Chest CTA 09/02/2016. FINDINGS: Cardiovascular: CABG since the 2018 CT. Mild cardiomegaly. No pericardial effusion. Mediastinum/Nodes: Extensive mediastinal lymphadenopathy, bulky with individual malignant appearing nodal metastases up to 3-3.5 centimeters short axis. Adenopathy continues to the thoracic inlet where abnormal level 4 nodes measure 12 millimeter short axis. Chronic thyromegaly and thyroid heterogeneity appears stable since 2018 and is likely benign. Unilateral left hilar lymphadenopathy which is inseparable from abnormal left lower lobe tumor/opacity. Lungs/Pleura: Heterogeneously enhancing lung and soft tissue about the left hilum with in case mint and compression of the branching of the left mainstem bronchus (series 4, image 68). Relatively few air bronchograms, mostly tracking inferiorly into the left lower lobe where as the opacity about the right hilum seems more compatible with confluent lung tumor. Superimposed trace layering left pleural effusion and/or irregular pleural thickening. Left lung pulmonary septal thickening which might indicate tumor related edema or lymphangitic carcinomatosis. No right lung nodule or mass.  No right pleural effusion. Upper Abdomen: Stable. Hepatomegaly. Numerous hypodense liver lesions were better demonstrated on the abdomen CT yesterday. Musculoskeletal: Prior sternotomy. No acute or suspicious bone lesion identified in the chest. IMPRESSION: 1. Advanced Thoracic Malignancy with combined tumor and atelectatic lung about the left hilum tracking into the left lower lobe. Mass effect on the left hilar airways. Bulky bilateral mediastinal lymphadenopathy, continuing to the thoracic inlet. 2. Liver metastases better demonstrated on the abdomen  CT yesterday. No skeletal metastasis identified in the chest by CT. 3. Chronic thyroid goiter appears stable since 2018 and benign. Electronically Signed   By: HGenevie AnnM.D.   On: 05/25/2018 23:27   Mr BJeri CosWNOContrast  Result Date: 05/25/2018 CLINICAL DATA:  53y/o M; HIV, liver lesions, pneumonia, fever, headache, nausea, vomiting, abdominal pain. EXAM: MRI HEAD WITHOUT AND WITH CONTRAST TECHNIQUE: Multiplanar, multiecho pulse sequences of the brain and surrounding structures were obtained without and with intravenous contrast. CONTRAST:  10 cc Gadavist COMPARISON:  05/24/2018 CT head FINDINGS: Brain: Thickening and enhancement of the infundibulum and hypothalamus measuring up to 7 mm (series 18, image 25-27 and series 9, image 12). No additional focus of intracranial abnormal enhancement. No reduced diffusion to suggest acute or early subacute infarction. No abnormal susceptibility hypointensity to indicate intracranial hemorrhage. No extra-axial collection, hydrocephalus, mass effect, or herniation. Several punctate nonspecific T2 FLAIR hyperintensities in subcortical and periventricular white matter are compatible with mild chronic microvascular ischemic changes. Mild volume loss of the brain. Vascular: Normal flow voids. Skull and upper cervical spine: There several T1 and T2 hypointense lesions within the calvarium demonstrate diffusion hyperintensity and there is appreciable enhancement of the largest lesion in left parietal bone (series 5, image 104 and 105 as well as series 18, image 53). Sinuses/Orbits: Negative. Other: None. IMPRESSION: 1. Several calvarial lesions, the largest lesion in the left parietal bone. Findings are suspicious for metastatic disease. 2. Nonspecific diffuse thickening and enhancement of infundibulum and hypothalamus. Differential includes sarcoidosis, histiocytosis, lymphocytic hypophysitis, infectious process such as syphilis, pseudotumor, lymphoproliferative disease, or  metastasis. 3. Mild chronic microvascular ischemic changes and volume loss of the brain. Electronically Signed   By: LEdgardo RoysD.  On: 05/25/2018 20:01   Ct Abdomen Pelvis W Contrast  Result Date: 05/24/2018 CLINICAL DATA:  Nausea vomiting and abdominal pain EXAM: CT ABDOMEN AND PELVIS WITH CONTRAST TECHNIQUE: Multidetector CT imaging of the abdomen and pelvis was performed using the standard protocol following bolus administration of intravenous contrast. CONTRAST:  155m ISOVUE-300 IOPAMIDOL (ISOVUE-300) INJECTION 61% COMPARISON:  06/29/2014 FINDINGS: Lower chest: 3.7 cm short axis subcarinal lymph node is associated with bulky lymphadenopathy in the left hilum and nodular airspace opacity left lower lobe measuring up to 3.8 cm. Tiny left pleural effusion associated. Hepatobiliary: Liver shows innumerable ill-defined low-density lesions consistent with metastatic disease. Dominant lesion in the inferior right liver measures 5.6 cm (49/2). Index lesion posterior right liver measures 3.6 cm on 30 /2. There is no evidence for gallstones, gallbladder wall thickening, or pericholecystic fluid. No intrahepatic or extrahepatic biliary dilation. Pancreas: No focal mass lesion. No dilatation of the main duct. No intraparenchymal cyst. No peripancreatic edema. Spleen: No splenomegaly. No focal mass lesion. Adrenals/Urinary Tract: No adrenal nodule or mass. 15 mm exophytic low-density lesion lower pole right kidney is probably a cyst. 3.7 cm lesion interpolar left kidney approaches water attenuation, also likely a cyst. No evidence for hydroureter. The urinary bladder appears normal for the degree of distention. Stomach/Bowel: Stomach is unremarkable. No gastric wall thickening. No evidence of outlet obstruction. Duodenum is normally positioned as is the ligament of Treitz. No small bowel wall thickening. No small bowel dilatation. The terminal ileum is normal. The appendix is normal. No gross colonic  mass. No colonic wall thickening. Vascular/Lymphatic: There is abdominal aortic atherosclerosis without aneurysm. Bulky lymphadenopathy is seen in the hepato duodenal ligament. Index 2.9 cm short axis lymph node identified on 39/2. 2.5 cm short axis pre caval lymph node visible on 42/2. Para-aortic lymphadenopathy is associated with 1.4 cm short axis aortocaval node visible on 50/2. No pelvic sidewall lymphadenopathy. Reproductive: The prostate gland and seminal vesicles are unremarkable. Other: No intraperitoneal free fluid. Musculoskeletal: Sclerotic lesion in the left femoral neck is stable since prior. No worrisome lytic or sclerotic osseous abnormality. IMPRESSION: 1. Innumerable ill-defined low-density liver lesions consistent with metastatic disease. 2. Bulky lymphadenopathy in the hepato duodenal ligament with para-aortic lymphadenopathy in the retroperitoneal space of the abdomen, findings consistent with metastatic involvement. 3. Incompletely visualized but markedly enlarged subcarinal lymph node is associated with apparent bulky left hilar lymphadenopathy and airspace disease in the left lower lobe. Dedicated CT chest with contrast material recommended to further evaluate. 4. Bilateral renal cysts. 5.  Aortic Atherosclerois (ICD10-170.0) Electronically Signed   By: EMisty StanleyM.D.   On: 05/24/2018 17:46        Scheduled Meds: . buPROPion  300 mg Oral [id number]  . dolutegravir  50 mg Oral Daily  . emtricitabine-tenofovir AF  1 tablet Oral Daily  . insulin aspart  0-9 Units Subcutaneous TID WC  . pravastatin  40 mg Oral QPM  . sertraline  50 mg Oral Daily  . sodium chloride flush  3 mL Intravenous Q12H   Continuous Infusions: . sodium chloride    . azithromycin Stopped (05/25/18 1832)  . cefTRIAXone (ROCEPHIN)  IV Stopped (05/25/18 1403)     LOS: 2 days    Time spent: 25 minutes   CEdwin Dada MD [facility] 05/26/2018, 11:07 AM     Pager [phone]---  please page though AMION:  [email or web address] If 7PM-7AM, please contact night-coverage
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
## 2018-05-26 NOTE — Progress Notes (Signed)
Patient seen and examined, no acute issues.  Hemodynamically stable.   BP 139/75 (BP Location: Left Arm)   Pulse 83   Temp 98.1 F (36.7 C) (Oral)   Resp 18   Ht 5\' 9"  (1.753 m)   Wt 127.3 kg   SpO2 95%   BMI 41.44 kg/m   I was given the signout by Dr. Loleta Books, patient transferred to [facility] from [facility]   A/P  Right hilar lung mass with local invasion, innumerable liver lesions consistent with metastatic disease, bulky lymphadenopathy, calvarial lesions, largest in left parietal bone  - IR biopsy pending, delayed due to Plavix, IR following -Discussed with Dr. Alvy Bimler for oncology consult, she has discussed with Dr. Mickeal Skinner, neuro-oncology.  Will await recommendations -Discussed with Dr. Tammi Klippel, radiation oncology, felt that patient will need radiation for the right lung mass, likely has postobstructive pneumonia and for calvarial lesions, will see today   Estill Cotta M.D. [facility] 05/26/2018, 2:24 PM  Pager: [phone]
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
## 2018-05-27 ENCOUNTER — Ambulatory Visit
Admit: 2018-05-27 | Discharge: 2018-05-27 | Disposition: A | Discharge status: Home / Self Care | Payer: 59 | Attending: Radiation Oncology | Admitting: Radiation Oncology

## 2018-05-27 ENCOUNTER — Ambulatory Visit: Payer: Self-pay | Admitting: Physician Assistant

## 2018-05-27 DIAGNOSIS — R160 Hepatomegaly, not elsewhere classified: Secondary | ICD-10-CM

## 2018-05-27 DIAGNOSIS — C3432 Malignant neoplasm of lower lobe, left bronchus or lung: Secondary | ICD-10-CM

## 2018-05-27 DIAGNOSIS — J189 Pneumonia, unspecified organism: Secondary | ICD-10-CM

## 2018-05-27 DIAGNOSIS — R918 Other nonspecific abnormal finding of lung field: Secondary | ICD-10-CM

## 2018-05-27 LAB — CBC
HCT: 42.8 % (ref 39.0–52.0)
Hemoglobin: 13.4 g/dL (ref 13.0–17.0)
MCH: 26.9 pg (ref 26.0–34.0)
MCHC: 31.3 g/dL (ref 30.0–36.0)
MCV: 85.9 fL (ref 80.0–100.0)
NRBC: 0 % (ref 0.0–0.2)
Platelets: 263 10*3/uL (ref 150–400)
RBC: 4.98 MIL/uL (ref 4.22–5.81)
RDW: 14.6 % (ref 11.5–15.5)
WBC: 8.8 10*3/uL (ref 4.0–10.5)

## 2018-05-27 LAB — BASIC METABOLIC PANEL
ANION GAP: 12 (ref 5–15)
BUN: 13 mg/dL (ref 6–20)
CO2: 18 mmol/L — ABNORMAL LOW (ref 22–32)
Calcium: 8.4 mg/dL — ABNORMAL LOW (ref 8.9–10.3)
Chloride: 101 mmol/L (ref 98–111)
Creatinine, Ser: 0.68 mg/dL (ref 0.61–1.24)
GFR calc Af Amer: >60 mL/min (ref >60)
GFR calc non Af Amer: >60 mL/min (ref >60)
Glucose, Bld: 119 mg/dL — ABNORMAL HIGH (ref 70–99)
Potassium: 3.6 mmol/L (ref 3.5–5.1)
Sodium: 131 mmol/L — ABNORMAL LOW (ref 135–145)

## 2018-05-27 LAB — GLUCOSE, CAPILLARY
Glucose-Capillary: 121 mg/dL — ABNORMAL HIGH (ref 70–99)
Glucose-Capillary: 124 mg/dL — ABNORMAL HIGH (ref 70–99)
Glucose-Capillary: 117 mg/dL — ABNORMAL HIGH (ref 70–99)
Glucose-Capillary: 120 mg/dL — ABNORMAL HIGH (ref 70–99)

## 2018-05-27 MED ORDER — BENZONATATE 100 MG PO CAPS
100.0000 mg | ORAL_CAPSULE | Freq: Three times a day (TID) | ORAL | Status: DC
  Administered 2018-05-27 – 2018-06-02 (×18): 100 mg via ORAL
  Filled 2018-05-27 (×20): qty 1

## 2018-05-27 MED ORDER — IPRATROPIUM-ALBUTEROL 0.5-2.5 (3) MG/3ML IN SOLN
3.0000 mL | Freq: Once | RESPIRATORY_TRACT | Status: AC
  Administered 2018-05-27: 3 mL via RESPIRATORY_TRACT
  Filled 2018-05-27: qty 3

## 2018-05-27 NOTE — Progress Notes (Signed)
PROGRESS NOTE    [name]  LKT:[id number] DOB: 09-Sep-1965 DOA: 05/24/2018 PCP: Lavone Orn, MD      Brief Narrative:  [name] is a 53 y.o. M with HIV well controlled and CAD s/p CABG 2018, presented with headaches and now malaise, nausea, vomiting and RUQ abdominal pain.  Evaluation was concerning for pneumonia.  He underwent CT abdomen due to presence of nausea vomiting and abdominal pain.  CT abdomen incidentally showed multiple liver lesions and retroperitoneal adenopathy.  Started on broad spectrum antibiotics for SIRS.  Subjective:  Patient complains of a dry cough.  Occasional shortness of breath.  Mild wheezing.  Did have episode of vomiting this morning.  Denies any abdominal pain.  Assessment & Plan:  Sepsis from pneumonia Suspected source pneumonia. Organism unknown. Cultures negative. Patient met criteria given tachycardia, tachypnea, leukocytosis, and evidence of organ dysfunction.  Lactate 2.1 mmol/L and washed out with fluids.  RVP negative.  Urinalysis no WBC. CT chest report suggests left lung airspace opacities seen on CXR may be lymphangitis spread, discussed with Oncology who felt associated pneumonia was likely.  Patient was continued on ceftriaxone and azithromycin.  WBC is normal.  He is afebrile.  Cough is likely secondary to pneumonia as well as his lung mass.  Could try nebulizer treatment.  Tessalon Perles.  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  Patient seen by medical oncology.  Patient needs a biopsy however as he has been taking Plavix biopsy needs to wait till next week.  Plan is to biopsy the liver lesion.  Noted on the airways from the tumor.  Patient seen by radiation oncology and needs to undergo radiation treatment to relieve this pressure. CT shows right hilar lung mass with local invasion.  MRI brain shows calavarial lesions and changes to hypothalamus.     History of HIV Patient reports he has not been taking his ART recently, unfortunately.  CD4 370. Continue Descovy and Tivicay  Coronary artery disease status post CABG Stable.  Followed by cardiology as outpatient.  Holding Plavix and aspirin for biopsy purposes.  Continue statin.  Continue beta-blocker.  ACE inhibitor on hold.  EF is normal based on last echocardiogram in 2018.  Essential hypertension As above.  Monitor blood pressures closely.  Diabetes mellitus type II Holding Jardiance and metformin.  Continue SSI.  Monitor CBGs.    History of depression and anxiety -Continue sertraline, Wellbutrin  Hyponatremia Could be due to SIADH from his lung mass.  Monitor.  Abnormal LFTs Most likely due to his liver lesions.   DVT prophylaxis: SCDs Code Status: Full code Family Communication: Discussed with the patient MDM and disposition Plan: Patient to undergo radiation treatment in the hospital.  He is off Plavix so that he can undergo biopsy next week.     Objective: Vitals:   05/26/18 2031 05/27/18 0534 05/27/18 1027 05/27/18 1030  BP: (!) 146/85 (!) 153/78    Pulse: 89 86    Resp: (!) 22 (!) 22    Temp: 98.2 F (36.8 C) 98.7 F (37.1 C)    TempSrc: Oral Oral    SpO2: 94% 93% 93% 93%  Weight:      Height:        Intake/Output Summary (Last 24 hours) at 05/27/2018 1331 Last data filed at 05/26/2018 2154 Gross per 24 hour  Intake 830 ml  Output -  Net 830 ml   [employer]  05/25/18 0225 05/26/18 1319  Weight: 128.3 kg 127.3 kg    Examination: BP (!) 153/78 (BP Location: Left Arm)   Pulse 86   Temp 98.7 F (37.1 C) (Oral)   Resp (!) 22   Ht '5\' 9"'  (1.753 m)   Wt 127.3 kg   SpO2 93%   BMI 41.44 kg/m    General appearance: Awake alert.  In no distress Resp: Mildly tachypneic.  Coarse breath sounds bilaterally.  Occasional wheezing heard.  No definite crackles.  Diminished air entry at the bases.   Cardio: S1-S2 is normal regular.  No S3-S4.  No rubs  murmurs or bruit GI: Abdomen is soft.  Nontender nondistended.  Bowel sounds are present normal.  No masses organomegaly Extremities: No edema.  Full range of motion of lower extremities. Neurologic: Alert and oriented x3.  No focal neurological deficits.     Data Reviewed: I have personally reviewed following labs and imaging studies:  CBC: Recent Labs  Lab 05/24/18 1553 05/25/18 0343 05/26/18 0313 05/27/18 0340  WBC 16.3* 11.3* 9.8 8.8  NEUTROABS 10.8* 6.8  --   --   HGB 15.4 13.5 13.3 13.4  HCT 48.6 42.8 42.1 42.8  MCV 85.0 85.3 84.2 85.9  PLT 309 236 238 454   Basic Metabolic Panel: Recent Labs  Lab 05/24/18 1553 05/25/18 0343 05/26/18 0313 05/27/18 0340  NA 132* 135 133* 131*  K 3.9 3.8 3.5 3.6  CL 101 104 103 101  CO2 20* 18* 21* 18*  GLUCOSE 149* 135* 121* 119*  BUN '11 9 10 13  ' CREATININE 0.88 0.93 0.78 0.68  CALCIUM 9.2 8.6* 8.4* 8.4*   GFR: Estimated Creatinine Clearance: 139.3 mL/min (by C-G formula based on SCr of 0.68 mg/dL). Liver Function Tests: Recent Labs  Lab 05/24/18 1553 05/25/18 0343  AST 128* 84*  ALT 109* 83*  ALKPHOS 476* 334*  BILITOT 1.0 0.9  PROT 7.7 6.4*  ALBUMIN 3.7 2.9*   Recent Labs  Lab 05/24/18 1553  LIPASE 63*   Coagulation Profile: Recent Labs  Lab 05/25/18 1059  INR 1.08   CBG: Recent Labs  Lab 05/26/18 1328 05/26/18 1633 05/26/18 2034 05/27/18 0622 05/27/18 1138  GLUCAP 134* 124* 124* 121* 124*   Urine analysis:    Component Value Date/Time   COLORURINE YELLOW 05/24/2018 2130   APPEARANCEUR CLEAR 05/24/2018 2130   LABSPEC <1.005 (L) 05/24/2018 2130   PHURINE 6.0 05/24/2018 2130   GLUCOSEU >=500 (A) 05/24/2018 2130   HGBUR NEGATIVE 05/24/2018 2130   BILIRUBINUR NEGATIVE 05/24/2018 2130   KETONESUR NEGATIVE 05/24/2018 2130   PROTEINUR 30 (A) 05/24/2018 2130   UROBILINOGEN 1.0 06/29/2014 2245   NITRITE NEGATIVE 05/24/2018 2130   LEUKOCYTESUR NEGATIVE 05/24/2018 2130    Recent Results (from the  past 240 hour(s))  Blood Culture (routine x 2)     Status: None (Preliminary result)   Collection Time: 05/24/18  4:06 PM  Result Value Ref Range Status   Specimen Description   Final    BLOOD RIGHT ANTECUBITAL Performed at [facility], [address] [place], [place] [address]    Special Requests   Final    BOTTLES DRAWN AEROBIC AND ANAEROBIC Blood Culture adequate volume Performed at [facility], [address] [place], [place] [address]    Culture   Final    NO GROWTH 2 DAYS Performed at [facility], [place] [address], [place], [place] [address]    Report Status PENDING  Incomplete  Blood Culture (routine x 2)     Status: None (Preliminary result)   Collection Time: 05/24/18  4:45 PM  Result Value Ref Range Status   Specimen Description   Final    BLOOD BLOOD RIGHT HAND Performed at [facility], [address] [place], [place] [address]    Special Requests   Final    BOTTLES DRAWN AEROBIC AND ANAEROBIC Blood Culture adequate volume Performed at [facility], [address] [place], [place] [address]    Culture   Final    NO GROWTH 2 DAYS Performed at [facility], [place] [address] [place], [place] [address]    Report Status PENDING  Incomplete  Respiratory Panel by PCR     Status: None   Collection Time: 05/24/18  6:49 PM  Result Value Ref Range Status   Adenovirus NOT DETECTED NOT DETECTED Final   Coronavirus 229E NOT DETECTED NOT DETECTED Final   Coronavirus HKU1 NOT DETECTED NOT DETECTED Final   Coronavirus NL63 NOT DETECTED NOT DETECTED Final   Coronavirus OC43 NOT DETECTED NOT DETECTED Final   Metapneumovirus NOT DETECTED NOT DETECTED Final   Rhinovirus / Enterovirus NOT DETECTED NOT DETECTED Final   Influenza A NOT DETECTED NOT DETECTED Final   Influenza B NOT DETECTED NOT DETECTED Final   Parainfluenza Virus 1 NOT DETECTED NOT DETECTED Final   Parainfluenza Virus 2 NOT DETECTED NOT DETECTED Final    Parainfluenza Virus 3 NOT DETECTED NOT DETECTED Final   Parainfluenza Virus 4 NOT DETECTED NOT DETECTED Final   Respiratory Syncytial Virus NOT DETECTED NOT DETECTED Final   Bordetella pertussis NOT DETECTED NOT DETECTED Final   Chlamydophila pneumoniae NOT DETECTED NOT DETECTED Final   Mycoplasma pneumoniae NOT DETECTED NOT DETECTED Final    Comment: Performed at [facility], [place] [address], [place], [place] [address]  MRSA PCR Screening     Status: None   Collection Time: 05/25/18  2:22 AM  Result Value Ref Range Status   MRSA by PCR NEGATIVE NEGATIVE Final    Comment:        The GeneXpert MRSA Assay (FDA approved for NASAL specimens only), is one component of a comprehensive MRSA colonization surveillance program. It is not intended to diagnose MRSA infection nor to guide or monitor treatment for MRSA infections. Performed at [facility], [place] [address], [place], [place] [address]   Culture, blood (routine x 2) Call MD if unable to obtain prior to antibiotics being given     Status: None (Preliminary result)   Collection Time: 05/25/18  3:43 AM  Result Value Ref Range Status   Specimen Description BLOOD RIGHT WRIST  Final   Special Requests   Final    BOTTLES DRAWN AEROBIC AND ANAEROBIC Blood Culture adequate volume   Culture   Final    NO GROWTH 1 DAY Performed at [facility], [place] [address] [place], [place] [address]    Report Status PENDING  Incomplete  Culture, blood (routine x 2) Call MD if unable to obtain prior to antibiotics being given     Status: None (Preliminary result)   Collection Time: 05/25/18  3:46 AM  Result Value Ref Range Status   Specimen Description BLOOD LEFT ANTECUBITAL  Final   Special Requests   Final    BOTTLES DRAWN AEROBIC AND ANAEROBIC Blood Culture adequate volume   Culture   Final    NO GROWTH 1  DAY Performed at [facility], [place] [address] [place], [place] [address]    Report Status PENDING  Incomplete          Radiology Studies: Ct Chest W Contrast  Result Date: 05/25/2018 CLINICAL DATA:  53 year old male with shortness of breath, fever, cough nausea and vomiting. History of HIV. Numerous liver lesion suspected to be metastases on CT Abdomen and Pelvis yesterday with abnormal left lung base. EXAM: CT CHEST WITH CONTRAST TECHNIQUE: Multidetector CT imaging of the chest was performed during intravenous contrast administration. CONTRAST:  45m OMNIPAQUE IOHEXOL 300 MG/ML  SOLN COMPARISON:  05/24/2018 chest radiographs.  Chest CTA 09/02/2016. FINDINGS: Cardiovascular: CABG since the 2018 CT. Mild cardiomegaly. No pericardial effusion. Mediastinum/Nodes: Extensive mediastinal lymphadenopathy, bulky with individual malignant appearing nodal metastases up to 3-3.5 centimeters short axis. Adenopathy continues to the thoracic inlet where abnormal level 4 nodes measure 12 millimeter short axis. Chronic thyromegaly and thyroid heterogeneity appears stable since 2018 and is likely benign. Unilateral left hilar lymphadenopathy which is inseparable from abnormal left lower lobe tumor/opacity. Lungs/Pleura: Heterogeneously enhancing lung and soft tissue about the left hilum with in case mint and compression of the branching of the left mainstem bronchus (series 4, image 68). Relatively few air bronchograms, mostly tracking inferiorly into the left lower lobe where as the opacity about the right hilum seems more compatible with confluent lung tumor. Superimposed trace layering left pleural effusion and/or irregular pleural thickening. Left lung pulmonary septal thickening which might indicate tumor related edema or lymphangitic carcinomatosis. No right lung nodule or mass.  No right pleural effusion. Upper Abdomen: Stable. Hepatomegaly. Numerous hypodense liver lesions were better demonstrated on the abdomen CT yesterday. Musculoskeletal: Prior sternotomy. No acute or suspicious bone lesion identified in the chest.  IMPRESSION: 1. Advanced Thoracic Malignancy with combined tumor and atelectatic lung about the left hilum tracking into the left lower lobe. Mass effect on the left hilar airways. Bulky bilateral mediastinal lymphadenopathy, continuing to the thoracic inlet. 2. Liver metastases better demonstrated on the abdomen CT yesterday. No skeletal metastasis identified in the chest by CT. 3. Chronic thyroid goiter appears stable since 2018 and benign. Electronically Signed   By: HGenevie AnnM.D.   On: 05/25/2018 23:27   Mr BJeri CosWFEContrast  Result Date: 05/25/2018 CLINICAL DATA:  53y/o M; HIV, liver lesions, pneumonia, fever, headache, nausea, vomiting, abdominal pain. EXAM: MRI HEAD WITHOUT AND WITH CONTRAST TECHNIQUE: Multiplanar, multiecho pulse sequences of the brain and surrounding structures were obtained without and with intravenous contrast. CONTRAST:  10 cc Gadavist COMPARISON:  05/24/2018 CT head FINDINGS: Brain: Thickening and enhancement of the infundibulum and hypothalamus measuring up to 7 mm (series 18, image 25-27 and series 9, image 12). No additional focus of intracranial abnormal enhancement. No reduced diffusion to suggest acute or early subacute infarction. No abnormal susceptibility hypointensity to indicate intracranial hemorrhage. No extra-axial collection, hydrocephalus, mass effect, or herniation. Several punctate nonspecific T2 FLAIR hyperintensities in subcortical and periventricular white matter are compatible with mild chronic microvascular ischemic changes. Mild volume loss of the brain. Vascular: Normal flow voids. Skull and upper cervical spine: There several T1 and T2 hypointense lesions within the calvarium demonstrate diffusion hyperintensity and there is appreciable enhancement of the largest lesion in left parietal bone (series 5, image 104 and 105 as well as series 18, image 53). Sinuses/Orbits: Negative. Other: None. IMPRESSION: 1. Several calvarial lesions, the largest lesion in the  left parietal bone. Findings are suspicious for metastatic  disease. 2. Nonspecific diffuse thickening and enhancement of infundibulum and hypothalamus. Differential includes sarcoidosis, histiocytosis, lymphocytic hypophysitis, infectious process such as syphilis, pseudotumor, lymphoproliferative disease, or metastasis. 3. Mild chronic microvascular ischemic changes and volume loss of the brain. Electronically Signed   By: Kristine Garbe M.D.   On: 05/25/2018 20:01        Scheduled Meds: . benzonatate  100 mg Oral TID  . buPROPion  300 mg Oral [id number]  . dolutegravir  50 mg Oral Daily  . emtricitabine-tenofovir AF  1 tablet Oral Daily  . insulin aspart  0-9 Units Subcutaneous TID WC  . metoprolol tartrate  25 mg Oral BID  . pravastatin  40 mg Oral QPM  . sertraline  50 mg Oral Daily  . sodium chloride flush  3 mL Intravenous Q12H   Continuous Infusions: . sodium chloride    . azithromycin 500 mg (05/26/18 1650)  . cefTRIAXone (ROCEPHIN)  IV 1 g (05/26/18 1459)     LOS: 3 days    Bonnielee Haff, MD [facility] 05/27/2018, 1:31 PM

## 2018-05-27 NOTE — Progress Notes (Signed)
[facility] Therapy Treatment Record Phone [phone]   Radiation Therapy was administered to [name] on: 05/27/2018  2:10 PM and was treatment # 1 out of a planned course of 10 treatments.  Radiation Treatment  1). Beam photons with 6-10 energy  2). Brachytherapy None  3). Stereotactic Radiosurgery None  4). Other Radiation None     Keyry Iracheta F Tanmay Halteman, RT (T)

## 2018-05-28 ENCOUNTER — Ambulatory Visit
Admit: 2018-05-28 | Discharge: 2018-05-28 | Disposition: A | Discharge status: Home / Self Care | Payer: 59 | Attending: Radiation Oncology | Admitting: Radiation Oncology

## 2018-05-28 DIAGNOSIS — E871 Hypo-osmolality and hyponatremia: Secondary | ICD-10-CM

## 2018-05-28 LAB — CBC
HCT: 43.5 % (ref 39.0–52.0)
Hemoglobin: 13.9 g/dL (ref 13.0–17.0)
MCH: 27.3 pg (ref 26.0–34.0)
MCHC: 32 g/dL (ref 30.0–36.0)
MCV: 85.5 fL (ref 80.0–100.0)
Platelets: 286 10*3/uL (ref 150–400)
RBC: 5.09 MIL/uL (ref 4.22–5.81)
RDW: 14.4 % (ref 11.5–15.5)
WBC: 8 10*3/uL (ref 4.0–10.5)
nRBC: 0 % (ref 0.0–0.2)

## 2018-05-28 LAB — BASIC METABOLIC PANEL
Anion gap: 10 (ref 5–15)
BUN: 8 mg/dL (ref 6–20)
CO2: 19 mmol/L — ABNORMAL LOW (ref 22–32)
Calcium: 8.4 mg/dL — ABNORMAL LOW (ref 8.9–10.3)
Chloride: 98 mmol/L (ref 98–111)
Creatinine, Ser: 0.63 mg/dL (ref 0.61–1.24)
GFR calc Af Amer: >60 mL/min (ref >60)
GFR calc non Af Amer: >60 mL/min (ref >60)
Glucose, Bld: 123 mg/dL — ABNORMAL HIGH (ref 70–99)
POTASSIUM: 3.7 mmol/L (ref 3.5–5.1)
Sodium: 127 mmol/L — ABNORMAL LOW (ref 135–145)

## 2018-05-28 LAB — GLUCOSE, CAPILLARY
GLUCOSE-CAPILLARY: 120 mg/dL — AB (ref 70–99)
Glucose-Capillary: 124 mg/dL — ABNORMAL HIGH (ref 70–99)
Glucose-Capillary: 127 mg/dL — ABNORMAL HIGH (ref 70–99)
Glucose-Capillary: 109 mg/dL — ABNORMAL HIGH (ref 70–99)

## 2018-05-28 LAB — OSMOLALITY, URINE: Osmolality, Ur: 629 mOsm/kg (ref 300–900)

## 2018-05-28 MED ORDER — SODIUM CHLORIDE 0.9% FLUSH: 10.0000 mL | INTRAVENOUS | Status: DC | PRN

## 2018-05-28 MED ORDER — SODIUM CHLORIDE 0.9 % IV SOLN
INTRAVENOUS | Status: AC
  Administered 2018-05-28: 13:00:00 via INTRAVENOUS

## 2018-05-28 NOTE — Progress Notes (Signed)
[facility] Therapy Treatment Record Phone [phone]   Radiation Therapy was administered to [name] on: 05/28/2018  12:16 PM and was treatment # 2 out of a planned course of 10 treatments.  Radiation Treatment  1). Beam photons with 6-10 energy  2). Brachytherapy None  3). Stereotactic Radiosurgery None  4). Other Radiation None     Shatoria Stooksbury F Isidor Bromell, RT (T)

## 2018-05-28 NOTE — Progress Notes (Signed)
PROGRESS NOTE    [name]  RDE:[id number] DOB: [birth year] DOA: 05/24/2018 PCP: Lavone Orn, MD      Brief Narrative:  [name] is a 53 y.o. M with HIV well controlled and CAD s/p CABG 2018, presented with headaches and now malaise, nausea, vomiting and RUQ abdominal pain.  Evaluation was concerning for pneumonia.  He underwent CT abdomen due to presence of nausea vomiting and abdominal pain.  CT abdomen incidentally showed multiple liver lesions and retroperitoneal adenopathy.  Started on broad spectrum antibiotics for SIRS.  Subjective:  Patient states that his cough is much better.  Occasional shortness of breath.  No wheezing today.  Continues to have nausea.  Had 2 episodes of vomiting yesterday.    Assessment & Plan:  Sepsis from pneumonia Suspected source pneumonia. Organism unknown. Cultures negative. Patient met criteria given tachycardia, tachypnea, leukocytosis, and evidence of organ dysfunction.  Lactate 2.1 mmol/L and washed out with fluids.  RVP negative.  Urinalysis no WBC. CT chest report suggests left lung airspace opacities seen on CXR may be lymphangitis spread, discussed with Oncology who felt associated pneumonia was likely.  Patient was started on ceftriaxone and azithromycin which is being continued.  WBC is normal.  Cough improved with nebulizer treatment.  Tessalon Perles.    Nausea and vomiting Abdomen is benign on examination.  In the right upper quadrant presumably from his liver metastases.  He had a CT scan of abdomen done a few days ago which revealed the metastatic process but no bowel abnormalities were noted.  His symptoms most likely are due to this metastatic process.  Treat symptomatically for now.  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  Patient seen by medical oncology.  Patient needs a biopsy however as he has been taking Plavix biopsy needs to wait till next  week.  Plan is to biopsy the liver lesion.  Patient seen by radiation oncology.  The mass appears to be exerting pressure on his airway and so it was felt that he would benefit from starting radiation treatment sooner than later.  He received his first treatment yesterday.  Plan is to give him treatment for 10 days total.  CT shows right hilar lung mass with local invasion.  MRI brain shows calavarial lesions and changes to hypothalamus.    Hyponatremia Sodium level noted to be low over the past few days.  Dropped to 127 this morning.  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  We will check urine osmolalities.  Could also be due to poor oral intake as he has had nausea vomiting for 2 days now.  We will give him IV fluids.  Recheck his labs tomorrow.  History of HIV Patient reports he has not been taking his ART recently, unfortunately.  CD4 370. Continue Descovy and Tivicay  Coronary artery disease status post CABG Stable.  Followed by cardiology as outpatient.  Holding Plavix and aspirin for biopsy purposes.  Continue statin.  Continue beta-blocker.  ACE inhibitor on hold.  EF is normal based on last echocardiogram in 2018.  Essential hypertension As above.  Monitor blood pressures closely.  Diabetes mellitus type II Holding Jardiance and metformin.  Continue SSI.  Monitor CBGs.    History of depression and anxiety -Continue sertraline, Wellbutrin  Abnormal LFTs Most likely due to his liver lesions.   DVT prophylaxis: SCDs Code Status: Full code Family Communication: Discussed with the patient MDM and disposition Plan: Management as outlined  above.  Continue to hold his Plavix.  Will discuss with oncology if pursuing the biopsy as an outpatient is a reasonable option in this patient.  But may have to reconsider if he continues to have nausea and vomiting.     Objective: Vitals:   05/27/18 1403 05/27/18 1405 05/27/18 2044 05/28/18 0608  BP: 135/79 135/79 (!) 151/76 135/63  Pulse: 83 83 85 87  Resp:  18 18 (!) 25 (!) 23  Temp: 98.1 F (36.7 C) 98.1 F (36.7 C) 98.2 F (36.8 C) 98.2 F (36.8 C)  TempSrc: Oral Oral Oral Oral  SpO2: 97% 97% 93% 94%  Weight:      Height:        Intake/Output Summary (Last 24 hours) at 05/28/2018 1213 Last data filed at 05/28/2018 0300 Gross per 24 hour  Intake 830 ml  Output -  Net 830 ml   Filed Weights   05/25/18 0225 05/26/18 1319  Weight: 128.3 kg 127.3 kg    Examination: BP 135/63 (BP Location: Right Arm)   Pulse 87   Temp 98.2 F (36.8 C) (Oral)   Resp (!) 23   Ht _0  (1.753 m)   Wt 127.3 kg   SpO2 94%   BMI 41.44 kg/m    General appearance: Awake alert.  In no distress Resp: Improved air entry bilaterally.  No wheezing heard today.  No definite crackles.   Cardio: S1-S2 is normal regular.  No S3-S4.  No rubs murmurs or bruit GI: Abdomen is soft.  Tender in the right upper quadrant.  Hepatomegaly appreciated.  No rebound rigidity or guarding.   Extremities: No edema.  Full range of motion of lower extremities. Neurologic: Alert and oriented x3.  No focal neurological deficits.    Data Reviewed: I have personally reviewed following labs and imaging studies:  CBC: Recent Labs  Lab 05/24/18 1553 05/25/18 0343 05/26/18 0313 05/27/18 0340 05/28/18 0340  WBC 16.3* 11.3* 9.8 8.8 8.0  NEUTROABS 10.8* 6.8  --   --   --   HGB 15.4 13.5 13.3 13.4 13.9  HCT 48.6 42.8 42.1 42.8 43.5  MCV 85.0 85.3 84.2 85.9 85.5  PLT 309 236 238 263 115   Basic Metabolic Panel: Recent Labs  Lab 05/24/18 1553 05/25/18 0343 05/26/18 0313 05/27/18 0340 05/28/18 0340  NA 132* 135 133* 131* 127*  K 3.9 3.8 3.5 3.6 3.7  CL 101 104 103 101 98  CO2 20* 18* 21* 18* 19*  GLUCOSE 149* 135* 121* 119* 123*  BUN _1 CREATININE 0.88 0.93 0.78 0.68 0.63  CALCIUM 9.2 8.6* 8.4* 8.4* 8.4*   GFR: Estimated Creatinine Clearance: 139.3 mL/min (by C-G formula based on SCr of 0.63 mg/dL). Liver Function Tests: Recent Labs  Lab 05/24/18 1553  05/25/18 0343  AST 128* 84*  ALT 109* 83*  ALKPHOS 476* 334*  BILITOT 1.0 0.9  PROT 7.7 6.4*  ALBUMIN 3.7 2.9*   Recent Labs  Lab 05/24/18 1553  LIPASE 63*   Coagulation Profile: Recent Labs  Lab 05/25/18 1059  INR 1.08   CBG: Recent Labs  Lab 05/27/18 0622 05/27/18 1138 05/27/18 1806 05/27/18 2142 05/28/18 0741  GLUCAP 121* 124* 117* 120* 124*   Urine analysis:    Component Value Date/Time   COLORURINE YELLOW 05/24/2018 2130   APPEARANCEUR CLEAR 05/24/2018 2130   LABSPEC <1.005 (L) 05/24/2018 2130   PHURINE 6.0 05/24/2018 2130   GLUCOSEU >=500 (A) 05/24/2018 2130   HGBUR NEGATIVE  05/24/2018 2130   [place] NEGATIVE 05/24/2018 2130   [place] NEGATIVE 05/24/2018 2130   PROTEINUR 30 (A) 05/24/2018 2130   UROBILINOGEN 1.0 06/29/2014 2245   NITRITE NEGATIVE 05/24/2018 2130   LEUKOCYTESUR NEGATIVE 05/24/2018 2130    Recent Results (from the past 240 hour(s))  Blood Culture (routine x 2)     Status: None (Preliminary result)   Collection Time: 05/24/18  4:06 PM  Result Value Ref Range Status   Specimen Description   Final    BLOOD RIGHT ANTECUBITAL Performed at [facility], [address] [place], [place] [address]    Special Requests   Final    BOTTLES DRAWN AEROBIC AND ANAEROBIC Blood Culture adequate volume Performed at [facility], [address] [place], [place] [address]    Culture   Final    NO GROWTH 4 DAYS Performed at [facility], [place] [address] [place], [place] [address]    Report Status PENDING  Incomplete  Blood Culture (routine x 2)     Status: None (Preliminary result)   Collection Time: 05/24/18  4:45 PM  Result Value Ref Range Status   Specimen Description   Final    BLOOD BLOOD RIGHT HAND Performed at [facility], [address] [place], [place] [address]    Special Requests   Final    BOTTLES DRAWN AEROBIC AND ANAEROBIC Blood Culture adequate volume Performed at [facility], [address] [place], [place] [address]    Culture   Final    NO GROWTH 4 DAYS Performed at [facility], [place] [address] [place], [place] [address]    Report Status PENDING  Incomplete  Respiratory Panel by PCR     Status: None   Collection Time: 05/24/18  6:49 PM  Result Value Ref Range Status   Adenovirus NOT DETECTED NOT DETECTED Final   Coronavirus 229E NOT DETECTED NOT DETECTED Final   Coronavirus HKU1 NOT DETECTED NOT DETECTED Final   Coronavirus NL63 NOT DETECTED NOT DETECTED Final   Coronavirus OC43 NOT DETECTED NOT DETECTED Final   Metapneumovirus NOT DETECTED NOT DETECTED Final   Rhinovirus / Enterovirus NOT DETECTED NOT DETECTED Final   Influenza A NOT DETECTED NOT DETECTED Final   Influenza B NOT DETECTED NOT DETECTED Final   Parainfluenza Virus 1 NOT DETECTED NOT DETECTED Final   Parainfluenza Virus 2 NOT DETECTED NOT DETECTED Final   Parainfluenza Virus 3 NOT DETECTED NOT DETECTED Final   Parainfluenza Virus 4 NOT DETECTED NOT DETECTED Final   Respiratory Syncytial Virus NOT DETECTED NOT DETECTED Final   Bordetella pertussis NOT DETECTED NOT DETECTED Final   Chlamydophila pneumoniae NOT DETECTED NOT DETECTED Final   Mycoplasma pneumoniae NOT DETECTED NOT DETECTED Final    Comment: Performed at [facility], [place] [address] [place], [place] [address]  MRSA PCR Screening     Status: None   Collection Time: 05/25/18  2:22 AM  Result Value Ref Range Status   MRSA by PCR NEGATIVE NEGATIVE Final    Comment:        The GeneXpert MRSA Assay (FDA approved for NASAL specimens only), is one component of a comprehensive MRSA colonization surveillance program. It is not intended to diagnose MRSA infection nor to guide or monitor treatment for MRSA infections. Performed at [facility], [place] [address], [place], [place] [address]   Culture, blood (routine x 2) Call MD  if unable to obtain prior to antibiotics being given     Status: None  (Preliminary result)   Collection Time: 05/25/18  3:43 AM  Result Value Ref Range Status   Specimen Description BLOOD RIGHT WRIST  Final   Special Requests   Final    BOTTLES DRAWN AEROBIC AND ANAEROBIC Blood Culture adequate volume   Culture   Final    NO GROWTH 3 DAYS Performed at [facility], [address], [place], [place] [address]    Report Status PENDING  Incomplete  Culture, blood (routine x 2) Call MD if unable to obtain prior to antibiotics being given     Status: None (Preliminary result)   Collection Time: 05/25/18  3:46 AM  Result Value Ref Range Status   Specimen Description BLOOD LEFT ANTECUBITAL  Final   Special Requests   Final    BOTTLES DRAWN AEROBIC AND ANAEROBIC Blood Culture adequate volume   Culture   Final    NO GROWTH 3 DAYS Performed at [facility], [place] [address] [place], [place] [address]    Report Status PENDING  Incomplete      Radiology Studies: No results found.    Scheduled Meds: . benzonatate  100 mg Oral TID  . buPROPion  300 mg Oral BH-q7a  . dolutegravir  50 mg Oral Daily  . emtricitabine-tenofovir AF  1 tablet Oral Daily  . insulin aspart  0-9 Units Subcutaneous TID WC  . metoprolol tartrate  25 mg Oral BID  . pravastatin  40 mg Oral QPM  . sertraline  50 mg Oral Daily  . sodium chloride flush  3 mL Intravenous Q12H   Continuous Infusions: . sodium chloride    . sodium chloride    . azithromycin 500 mg (05/27/18 1455)  . cefTRIAXone (ROCEPHIN)  IV 1 g (05/27/18 1340)     LOS: 4 days    Bonnielee Haff, MD [facility] 05/28/2018, 12:13 PM

## 2018-05-29 ENCOUNTER — Ambulatory Visit
Admit: 2018-05-29 | Discharge: 2018-05-29 | Disposition: A | Discharge status: Home / Self Care | Payer: 59 | Attending: Radiation Oncology | Admitting: Radiation Oncology

## 2018-05-29 LAB — CULTURE, BLOOD (ROUTINE X 2)
Culture: NO GROWTH
Culture: NO GROWTH
Special Requests: ADEQUATE
Special Requests: ADEQUATE

## 2018-05-29 LAB — GLUCOSE, CAPILLARY
Glucose-Capillary: 127 mg/dL — ABNORMAL HIGH (ref 70–99)
Glucose-Capillary: 134 mg/dL — ABNORMAL HIGH (ref 70–99)
Glucose-Capillary: 143 mg/dL — ABNORMAL HIGH (ref 70–99)
Glucose-Capillary: 128 mg/dL — ABNORMAL HIGH (ref 70–99)

## 2018-05-29 LAB — CBC
HEMATOCRIT: 45.2 % (ref 39.0–52.0)
Hemoglobin: 14.6 g/dL (ref 13.0–17.0)
MCH: 27.5 pg (ref 26.0–34.0)
MCHC: 32.3 g/dL (ref 30.0–36.0)
MCV: 85.3 fL (ref 80.0–100.0)
Platelets: 288 10*3/uL (ref 150–400)
RBC: 5.3 MIL/uL (ref 4.22–5.81)
RDW: 14.4 % (ref 11.5–15.5)
WBC: 7.5 10*3/uL (ref 4.0–10.5)
nRBC: 0 % (ref 0.0–0.2)

## 2018-05-29 LAB — BASIC METABOLIC PANEL
Anion gap: 12 (ref 5–15)
BUN: 7 mg/dL (ref 6–20)
CO2: 17 mmol/L — ABNORMAL LOW (ref 22–32)
Calcium: 8.4 mg/dL — ABNORMAL LOW (ref 8.9–10.3)
Chloride: 97 mmol/L — ABNORMAL LOW (ref 98–111)
Creatinine, Ser: 0.6 mg/dL — ABNORMAL LOW (ref 0.61–1.24)
GFR calc Af Amer: >60 mL/min (ref >60)
GFR calc non Af Amer: >60 mL/min (ref >60)
Glucose, Bld: 119 mg/dL — ABNORMAL HIGH (ref 70–99)
POTASSIUM: 3.7 mmol/L (ref 3.5–5.1)
Sodium: 126 mmol/L — ABNORMAL LOW (ref 135–145)

## 2018-05-29 MED ORDER — SODIUM CHLORIDE 1 G PO TABS
1.0000 g | ORAL_TABLET | Freq: Three times a day (TID) | ORAL | Status: DC
  Administered 2018-05-29: 1 g via ORAL
  Filled 2018-05-29 (×3): qty 1

## 2018-05-29 NOTE — Progress Notes (Signed)
CD of radiology images given to patient

## 2018-05-29 NOTE — Progress Notes (Signed)
PROGRESS NOTE    CORDARRIUS COAD  DGU:[id number] DOB: March 07, 1966 DOA: 05/24/2018 PCP: Lavone Orn, MD      Brief Narrative:  Mr. Schloemer is a 53 y.o. M with HIV well controlled and CAD s/p CABG 2018, presented with headaches and now malaise, nausea, vomiting and RUQ abdominal pain.  Evaluation was concerning for pneumonia.  He underwent CT abdomen due to presence of nausea vomiting and abdominal pain.  CT abdomen incidentally showed multiple liver lesions and retroperitoneal adenopathy.  Started on broad spectrum antibiotics for SIRS.  Subjective:  Patient states that his cough has improved.  Abdominal pain is better.  Did not have any nausea or vomiting yesterday.    Assessment & Plan:  Sepsis from pneumonia Suspected source pneumonia. Organism unknown. Cultures negative. Patient met criteria given tachycardia, tachypnea, leukocytosis, and evidence of organ dysfunction.  Lactate 2.1 mmol/L and washed out with fluids.  Respiratory viral panel negative.  Urinalysis no WBC. CT chest report suggests left lung airspace opacities seen on CXR may be lymphangitis spread, discussed with Oncology who felt associated pneumonia was likely.  Patient was started on ceftriaxone and azithromycin which is being continued.  Today he will complete 5 days of treatment.  Will discontinue after today's doses.  WBC is normal.  Cough improved with nebulizer treatment.  Tessalon Perles.    Nausea and vomiting Abdomen is benign on examination.  In the right upper quadrant presumably from his liver metastases.  He had a CT scan of abdomen done a few days ago which revealed the metastatic process but no bowel abnormalities were noted.  His symptoms most likely are due to this metastatic process.  Treat symptomatically for now.  Symptoms improved this morning.  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  Patient seen by medical oncology.   Patient needs a biopsy however as he has been taking Plavix biopsy needs to wait till next week.  Plan is to biopsy the liver lesion. CT shows right hilar lung mass with local invasion.  MRI brain shows calavarial lesions and changes to hypothalamus.   Patient seen by radiation oncology.  The mass appears to be exerting pressure on his airway and so it was felt that he would benefit from starting radiation treatment sooner than later.  Plan is to give him radiation treatment for 10 days.  This was started on 2/5.     Hyponatremia Sodium level noted to be 126 this morning.  Urine osmolality is 629.  No improvement with IV fluids.  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  He will be placed on fluid restriction.  Salt tablets.  Recheck labs tomorrow.  History of HIV Patient reports he has not been taking his ART recently, unfortunately.  CD4 370. Continue Descovy and Tivicay  Coronary artery disease status post CABG Stable.  Followed by cardiology as outpatient.  Holding Plavix and aspirin for biopsy purposes.  Continue statin.  Continue beta-blocker.  ACE inhibitor on hold.  EF is normal based on last echocardiogram in 2018.  Essential hypertension As above.  Monitor blood pressures closely.  Diabetes mellitus type II Holding Jardiance and metformin.  Continue SSI.  Monitor CBGs.  CBGs are reasonably well controlled.  History of depression and anxiety -Continue sertraline, Wellbutrin  Abnormal LFTs Most likely due to his liver lesions.   DVT prophylaxis: SCDs Code Status: Full code Family Communication: Discussed with the patient MDM and disposition Plan: Management as outlined above.  Due to his hyponatremia he will continue to need to stay in the hospital at least for now.  Biopsy next week.   Objective: Vitals:   05/28/18 0608 05/28/18 1343 05/28/18 2239 05/29/18 0553  BP: 135/63 (!) 143/88 136/84 138/82  Pulse: 87 79 82 92  Resp: (!) 23 18 (!) 24 (!) 24  Temp: 98.2 F (36.8 C) 98.2 F  (36.8 C) 98 F (36.7 C) 97.9 F (36.6 C)  TempSrc: Oral Oral Oral Oral  SpO2: 94% 92% 93% 94%  Weight:      Height:        Intake/Output Summary (Last 24 hours) at 05/29/2018 1125 Last data filed at 05/29/2018 0600 Gross per 24 hour  Intake 1400 ml  Output 400 ml  Net 1000 ml   Filed Weights   05/25/18 0225 05/26/18 1319  Weight: 128.3 kg 127.3 kg    Examination: BP 138/82 (BP Location: Left Arm)   Pulse 92   Temp 97.9 F (36.6 C) (Oral)   Resp (!) 24   Ht '5\' 9"'  (1.753 m)   Wt 127.3 kg   SpO2 94%   BMI 41.44 kg/m    General appearance: Awake alert.  In no distress Resp: Normal effort.  Coarse breath sounds.  Few crackles at the bases but improved air entry overall. Cardio: S1-S2 is normal regular.  No S3-S4.  No rubs murmurs or bruit GI: Abdomen is soft.  Mildly tender in the right upper quadrant.  Hepatomegaly appreciated.  No rebound rigidity or guarding.   Extremities: No edema.  Full range of motion of lower extremities. Neurologic: Alert and oriented x3.  No focal neurological deficits.    Data Reviewed: I have personally reviewed following labs and imaging studies:  CBC: Recent Labs  Lab 05/24/18 1553 05/25/18 0343 05/26/18 0313 05/27/18 0340 05/28/18 0340 05/29/18 0345  WBC 16.3* 11.3* 9.8 8.8 8.0 7.5  NEUTROABS 10.8* 6.8  --   --   --   --   HGB 15.4 13.5 13.3 13.4 13.9 14.6  HCT 48.6 42.8 42.1 42.8 43.5 45.2  MCV 85.0 85.3 84.2 85.9 85.5 85.3  PLT 309 236 238 263 286 177   Basic Metabolic Panel: Recent Labs  Lab 05/25/18 0343 05/26/18 0313 05/27/18 0340 05/28/18 0340 05/29/18 0345  NA 135 133* 131* 127* 126*  K 3.8 3.5 3.6 3.7 3.7  CL 104 103 101 98 97*  CO2 18* 21* 18* 19* 17*  GLUCOSE 135* 121* 119* 123* 119*  BUN '9 10 13 8 7  ' CREATININE 0.93 0.78 0.68 0.63 0.60*  CALCIUM 8.6* 8.4* 8.4* 8.4* 8.4*   GFR: Estimated Creatinine Clearance: 139.3 mL/min (A) (by C-G formula based on SCr of 0.6 mg/dL (L)).  Liver Function Tests: Recent  Labs  Lab 05/24/18 1553 05/25/18 0343  AST 128* 84*  ALT 109* 83*  ALKPHOS 476* 334*  BILITOT 1.0 0.9  PROT 7.7 6.4*  ALBUMIN 3.7 2.9*   Recent Labs  Lab 05/24/18 1553  LIPASE 63*   Coagulation Profile: Recent Labs  Lab 05/25/18 1059  INR 1.08   CBG: Recent Labs  Lab 05/28/18 0741 05/28/18 1343 05/28/18 1750 05/28/18 2241 05/29/18 0728  GLUCAP 124* 120* 127* 109* 127*   Urine analysis:    Component Value Date/Time   COLORURINE YELLOW 05/24/2018 2130   APPEARANCEUR CLEAR 05/24/2018 2130   LABSPEC <1.005 (L) 05/24/2018 2130   PHURINE 6.0 05/24/2018 2130   GLUCOSEU >=500 (A) 05/24/2018 2130   HGBUR NEGATIVE 05/24/2018 2130  [place] NEGATIVE 05/24/2018 2130   [place] NEGATIVE 05/24/2018 2130   PROTEINUR 30 (A) 05/24/2018 2130   UROBILINOGEN 1.0 06/29/2014 2245   NITRITE NEGATIVE 05/24/2018 2130   LEUKOCYTESUR NEGATIVE 05/24/2018 2130    Recent Results (from the past 240 hour(s))  Blood Culture (routine x 2)     Status: None   Collection Time: 05/24/18  4:06 PM  Result Value Ref Range Status   Specimen Description   Final    BLOOD RIGHT ANTECUBITAL Performed at [facility], [address] [place], [place] [address]    Special Requests   Final    BOTTLES DRAWN AEROBIC AND ANAEROBIC Blood Culture adequate volume Performed at [facility], [address] [place], [place] [address]    Culture   Final    NO GROWTH 5 DAYS Performed at [facility], [place] [address], [place], [place] [address]    Report Status 05/29/2018 FINAL  Final  Blood Culture (routine x 2)     Status: None   Collection Time: 05/24/18  4:45 PM  Result Value Ref Range Status   Specimen Description   Final    BLOOD BLOOD RIGHT HAND Performed at [facility], [address] [place], [place] [address]    Special Requests   Final    BOTTLES DRAWN AEROBIC AND ANAEROBIC Blood Culture adequate volume Performed at [facility], [address] [place], [place] [address]    Culture   Final    NO GROWTH 5 DAYS Performed at [facility], [place] [address], [place], [place] [address]    Report Status 05/29/2018 FINAL  Final  Respiratory Panel by PCR     Status: None   Collection Time: 05/24/18  6:49 PM  Result Value Ref Range Status   Adenovirus NOT DETECTED NOT DETECTED Final   Coronavirus 229E NOT DETECTED NOT DETECTED Final   Coronavirus HKU1 NOT DETECTED NOT DETECTED Final   Coronavirus NL63 NOT DETECTED NOT DETECTED Final   Coronavirus OC43 NOT DETECTED NOT DETECTED Final   Metapneumovirus NOT DETECTED NOT DETECTED Final   Rhinovirus / Enterovirus NOT DETECTED NOT DETECTED Final   Influenza A NOT DETECTED NOT DETECTED Final   Influenza B NOT DETECTED NOT DETECTED Final   Parainfluenza Virus 1 NOT DETECTED NOT DETECTED Final   Parainfluenza Virus 2 NOT DETECTED NOT DETECTED Final   Parainfluenza Virus 3 NOT DETECTED NOT DETECTED Final   Parainfluenza Virus 4 NOT DETECTED NOT DETECTED Final   Respiratory Syncytial Virus NOT DETECTED NOT DETECTED Final   Bordetella pertussis NOT DETECTED NOT DETECTED Final   Chlamydophila pneumoniae NOT DETECTED NOT DETECTED Final   Mycoplasma pneumoniae NOT DETECTED NOT DETECTED Final    Comment: Performed at [facility], [place] [address], [place], [place] [address]  MRSA PCR Screening     Status: None   Collection Time: 05/25/18  2:22 AM  Result Value Ref Range Status   MRSA by PCR NEGATIVE NEGATIVE Final    Comment:        The GeneXpert MRSA Assay (FDA approved for NASAL specimens only), is one component of a comprehensive MRSA colonization surveillance program. It is not intended to diagnose MRSA infection nor to guide or monitor treatment for MRSA infections. Performed at [facility], [place] [address] [place], [place] [address]   Culture, blood (routine x 2) Call MD if unable to obtain prior to  antibiotics being given     Status: None (Preliminary  result)   Collection Time: 05/25/18  3:43 AM  Result Value Ref Range Status   Specimen Description BLOOD RIGHT WRIST  Final   Special Requests   Final    BOTTLES DRAWN AEROBIC AND ANAEROBIC Blood Culture adequate volume   Culture   Final    NO GROWTH 4 DAYS Performed at [facility], [address], [place], [place] [address]    Report Status PENDING  Incomplete  Culture, blood (routine x 2) Call MD if unable to obtain prior to antibiotics being given     Status: None (Preliminary result)   Collection Time: 05/25/18  3:46 AM  Result Value Ref Range Status   Specimen Description BLOOD LEFT ANTECUBITAL  Final   Special Requests   Final    BOTTLES DRAWN AEROBIC AND ANAEROBIC Blood Culture adequate volume   Culture   Final    NO GROWTH 4 DAYS Performed at [facility], [place] [address] [place], [place] [address]    Report Status PENDING  Incomplete      Radiology Studies: No results found.    Scheduled Meds: . benzonatate  100 mg Oral TID  . buPROPion  300 mg Oral BH-q7a  . dolutegravir  50 mg Oral Daily  . emtricitabine-tenofovir AF  1 tablet Oral Daily  . insulin aspart  0-9 Units Subcutaneous TID WC  . metoprolol tartrate  25 mg Oral BID  . pravastatin  40 mg Oral QPM  . sertraline  50 mg Oral Daily  . sodium chloride flush  3 mL Intravenous Q12H  . sodium chloride  1 g Oral TID WC   Continuous Infusions: . sodium chloride    . azithromycin 500 mg (05/28/18 1432)  . cefTRIAXone (ROCEPHIN)  IV 1 g (05/28/18 1303)     LOS: 5 days    Bonnielee Haff, MD [facility] 05/29/2018, 11:25 AM

## 2018-05-30 LAB — BASIC METABOLIC PANEL
Anion gap: 11 (ref 5–15)
BUN: 11 mg/dL (ref 6–20)
CO2: 18 mmol/L — ABNORMAL LOW (ref 22–32)
Calcium: 8.5 mg/dL — ABNORMAL LOW (ref 8.9–10.3)
Chloride: 97 mmol/L — ABNORMAL LOW (ref 98–111)
Creatinine, Ser: 0.81 mg/dL (ref 0.61–1.24)
GFR calc Af Amer: >60 mL/min (ref >60)
GFR calc non Af Amer: >60 mL/min (ref >60)
Glucose, Bld: 106 mg/dL — ABNORMAL HIGH (ref 70–99)
POTASSIUM: 3.7 mmol/L (ref 3.5–5.1)
Sodium: 126 mmol/L — ABNORMAL LOW (ref 135–145)

## 2018-05-30 LAB — HIV-1 RNA QUANT-NO REFLEX-BLD
HIV 1 RNA Quant: 180 copies/mL
LOG10 HIV-1 RNA: 2.255 log10copy/mL

## 2018-05-30 LAB — CULTURE, BLOOD (ROUTINE X 2)
Culture: NO GROWTH
Culture: NO GROWTH
SPECIAL REQUESTS: ADEQUATE
Special Requests: ADEQUATE

## 2018-05-30 LAB — GLUCOSE, CAPILLARY
GLUCOSE-CAPILLARY: 110 mg/dL — AB (ref 70–99)
Glucose-Capillary: 119 mg/dL — ABNORMAL HIGH (ref 70–99)
Glucose-Capillary: 90 mg/dL (ref 70–99)
Glucose-Capillary: 142 mg/dL — ABNORMAL HIGH (ref 70–99)

## 2018-05-30 MED ORDER — FUROSEMIDE 10 MG/ML IJ SOLN
20.0000 mg | Freq: Once | INTRAMUSCULAR | Status: AC
  Administered 2018-05-30: 20 mg via INTRAVENOUS
  Filled 2018-05-30: qty 2

## 2018-05-30 MED ORDER — SODIUM CHLORIDE 1 G PO TABS
2.0000 g | ORAL_TABLET | Freq: Three times a day (TID) | ORAL | Status: DC
  Administered 2018-05-30 – 2018-06-02 (×9): 2 g via ORAL
  Filled 2018-05-30 (×13): qty 2

## 2018-05-30 NOTE — Progress Notes (Signed)
PROGRESS NOTE    [name]  NLZ:[id number] DOB: 1966/01/12 DOA: 05/24/2018 PCP: Lavone Orn, MD      Brief Narrative:  [name] is a 53 y.o. M with HIV well controlled and CAD s/p CABG 2018, presented with headaches and now malaise, nausea, vomiting and RUQ abdominal pain.  Evaluation was concerning for pneumonia.  He underwent CT abdomen due to presence of nausea vomiting and abdominal pain.  CT abdomen incidentally showed multiple liver lesions and retroperitoneal adenopathy.  Started on broad spectrum antibiotics for SIRS.  Subjective:  Patient states that he feels well overall.  Cough is subsided.  Denies any shortness of breath.  Continues to have nausea at times with poor oral intake.  One episode of vomiting yesterday.     Assessment & Plan:  Sepsis from pneumonia Suspected source pneumonia. Cultures negative. Patient met criteria given tachycardia, tachypnea, leukocytosis, and evidence of organ dysfunction.  Lactate 2.1 mmol/L and washed out with fluids.  Respiratory viral panel negative.  UA was unremarkable.  CT chest report suggests left lung airspace opacities seen on CXR may be lymphangitis spread, discussed with Oncology who felt associated pneumonia was likely.  Patient has completed 5 days of treatment with ceftriaxone and azithromycin.  Cough and shortness of breath has improved.   Nausea and vomiting Abdomen is benign on examination.  Mildly tender in the right upper quadrant presumably from his liver metastases.  He had a CT scan of abdomen done a few days ago which revealed the metastatic process but no bowel abnormalities were noted.  His symptoms most likely are due to this metastatic process.  Treat symptomatically for now.    Hyponatremia likely secondary to SIADH Patient was noted to be hyponatremic with a sodium levels in the 130s.  Over the last couple of days it has dropped down into the 120s.  Since he was experiencing some nausea and vomiting there was felt to  be an element of hypovolemia.  Patient was given IV fluids without much improvement.  His urine osmolality was noted to be 629.  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  Patient placed on fluid restriction.  No change in sodium levels this morning.  Increase dose of salt tablets.  Given 1 dose of Lasix.  Recheck labs tomorrow.    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  Patient seen by medical oncology.  Undergone biopsy as he had been on Plavix.  Plavix was held. Hopefully he will undergo biopsy on Monday or Tuesday.   Plan is to biopsy the liver lesion. CT shows right hilar lung mass with local invasion.  MRI brain shows calavarial lesions and changes to hypothalamus.   Patient seen by radiation oncology.  The mass appears to be exerting pressure on his airway and so it was felt that he would benefit from starting radiation treatment sooner than later.  Plan is to give him radiation treatment for 10 days.  This was started on 2/5.     History of HIV Patient apparently had not been compliant with his antiretroviral treatment.  CD4 370. Continue Descovy and Tivicay.  Coronary artery disease status post CABG Stable.  Followed by cardiology as outpatient.  Holding Plavix and aspirin for biopsy purposes.  Continue statin.  Continue beta-blocker.  ACE inhibitor on hold.  EF is normal based on last echocardiogram in 2018.  Essential hypertension As  above.  Monitor blood pressures closely.  Diabetes mellitus type II Holding Jardiance and metformin.  Continue SSI.  Monitor CBGs.  CBGs are reasonably well controlled.  History of depression and anxiety -Continue sertraline, Wellbutrin  Abnormal LFTs Most likely due to his liver lesions.   DVT prophylaxis: SCDs Code Status: Full code Family Communication: Discussed with the patient MDM and disposition Plan: Management as  outlined above.  Due to his hyponatremia he will continue to need to stay in the hospital at least for now.  Biopsy next week.   Objective: Vitals:   05/29/18 1302 05/29/18 2119 05/29/18 2136 05/30/18 0500  BP: 119/70 121/68 121/68 138/89  Pulse: 79 98 96 89  Resp: (!) '24  20 20  ' Temp: 98.3 F (36.8 C)  98.1 F (36.7 C) 98.1 F (36.7 C)  TempSrc: Oral  Oral Oral  SpO2: 92%  95% 95%  Weight:      Height:        Intake/Output Summary (Last 24 hours) at 05/30/2018 1237 Last data filed at 05/30/2018 0824 Gross per 24 hour  Intake 220.22 ml  Output -  Net 220.22 ml   Filed Weights   05/25/18 0225 05/26/18 1319  Weight: 128.3 kg 127.3 kg    Examination: BP 138/89 (BP Location: Left Arm)   Pulse 89   Temp 98.1 F (36.7 C) (Oral)   Resp 20   Ht '5\' 9"'  (1.753 m)   Wt 127.3 kg   SpO2 95%   BMI 41.44 kg/m    General appearance: Awake alert.  In no distress Resp: Normal effort.  Coarse breath sounds bilaterally.  Diminished at the bases.  No wheezing or rhonchi.   Cardio: S1-S2 is normal regular.  No S3-S4.  No rubs murmurs or bruit GI: Abdomen is soft.  Tender in the right upper quadrant with hepatomegaly.  No rebound rigidity or guarding.  Extremities: No edema.  Full range of motion of lower extremities. Neurologic: Alert and oriented x3.  No focal neurological deficits.     Data Reviewed: I have personally reviewed following labs and imaging studies:  CBC: Recent Labs  Lab 05/24/18 1553 05/25/18 0343 05/26/18 0313 05/27/18 0340 05/28/18 0340 05/29/18 0345  WBC 16.3* 11.3* 9.8 8.8 8.0 7.5  NEUTROABS 10.8* 6.8  --   --   --   --   HGB 15.4 13.5 13.3 13.4 13.9 14.6  HCT 48.6 42.8 42.1 42.8 43.5 45.2  MCV 85.0 85.3 84.2 85.9 85.5 85.3  PLT 309 236 238 263 286 315   Basic Metabolic Panel: Recent Labs  Lab 05/26/18 0313 05/27/18 0340 05/28/18 0340 05/29/18 0345 05/30/18 0510  NA 133* 131* 127* 126* 126*  K 3.5 3.6 3.7 3.7 3.7  CL 103 101 98 97* 97*  CO2  21* 18* 19* 17* 18*  GLUCOSE 121* 119* 123* 119* 106*  BUN '10 13 8 7 11  ' CREATININE 0.78 0.68 0.63 0.60* 0.81  CALCIUM 8.4* 8.4* 8.4* 8.4* 8.5*   GFR: Estimated Creatinine Clearance: 137.6 mL/min (by C-G formula based on SCr of 0.81 mg/dL).  Liver Function Tests: Recent Labs  Lab 05/24/18 1553 05/25/18 0343  AST 128* 84*  ALT 109* 83*  ALKPHOS 476* 334*  BILITOT 1.0 0.9  PROT 7.7 6.4*  ALBUMIN 3.7 2.9*   Recent Labs  Lab 05/24/18 1553  LIPASE 63*   Coagulation Profile: Recent Labs  Lab 05/25/18 1059  INR 1.08   CBG: Recent Labs  Lab 05/29/18 1200 05/29/18 1806 05/29/18  2120 05/30/18 0745 05/30/18 1149  GLUCAP 134* 143* 128* 119* 110*   Urine analysis:    Component Value Date/Time   COLORURINE YELLOW 05/24/2018 2130   APPEARANCEUR CLEAR 05/24/2018 2130   LABSPEC <1.005 (L) 05/24/2018 2130   PHURINE 6.0 05/24/2018 2130   GLUCOSEU >=500 (A) 05/24/2018 2130   HGBUR NEGATIVE 05/24/2018 2130   BILIRUBINUR NEGATIVE 05/24/2018 2130   KETONESUR NEGATIVE 05/24/2018 2130   PROTEINUR 30 (A) 05/24/2018 2130   UROBILINOGEN 1.0 06/29/2014 2245   NITRITE NEGATIVE 05/24/2018 2130   LEUKOCYTESUR NEGATIVE 05/24/2018 2130    Recent Results (from the past 240 hour(s))  Blood Culture (routine x 2)     Status: None   Collection Time: 05/24/18  4:06 PM  Result Value Ref Range Status   Specimen Description   Final    BLOOD RIGHT ANTECUBITAL Performed at [facility], [address] [place], [place] [address]    Special Requests   Final    BOTTLES DRAWN AEROBIC AND ANAEROBIC Blood Culture adequate volume Performed at [facility], [address] [place], [place] [address]    Culture   Final    NO GROWTH 5 DAYS Performed at [facility], [place] [address], [place], [place] [address]    Report Status 05/29/2018 FINAL  Final  Blood Culture (routine x 2)     Status: None   Collection Time: 05/24/18  4:45 PM  Result Value Ref Range Status    Specimen Description   Final    BLOOD BLOOD RIGHT HAND Performed at [facility], [address] [place], [place] [address]    Special Requests   Final    BOTTLES DRAWN AEROBIC AND ANAEROBIC Blood Culture adequate volume Performed at [facility], [address] [place], [place] [address]    Culture   Final    NO GROWTH 5 DAYS Performed at [facility], [place] [address] [place], [place] [address]    Report Status 05/29/2018 FINAL  Final  Respiratory Panel by PCR     Status: None   Collection Time: 05/24/18  6:49 PM  Result Value Ref Range Status   Adenovirus NOT DETECTED NOT DETECTED Final   Coronavirus 229E NOT DETECTED NOT DETECTED Final   Coronavirus HKU1 NOT DETECTED NOT DETECTED Final   Coronavirus NL63 NOT DETECTED NOT DETECTED Final   Coronavirus OC43 NOT DETECTED NOT DETECTED Final   Metapneumovirus NOT DETECTED NOT DETECTED Final   Rhinovirus / Enterovirus NOT DETECTED NOT DETECTED Final   Influenza A NOT DETECTED NOT DETECTED Final   Influenza B NOT DETECTED NOT DETECTED Final   Parainfluenza Virus 1 NOT DETECTED NOT DETECTED Final   Parainfluenza Virus 2 NOT DETECTED NOT DETECTED Final   Parainfluenza Virus 3 NOT DETECTED NOT DETECTED Final   Parainfluenza Virus 4 NOT DETECTED NOT DETECTED Final   Respiratory Syncytial Virus NOT DETECTED NOT DETECTED Final   Bordetella pertussis NOT DETECTED NOT DETECTED Final   Chlamydophila pneumoniae NOT DETECTED NOT DETECTED Final   Mycoplasma pneumoniae NOT DETECTED NOT DETECTED Final    Comment: Performed at [facility], [place] [address], [place], [place] [address]  MRSA PCR Screening     Status: None   Collection Time: 05/25/18  2:22 AM  Result Value Ref Range Status   MRSA by PCR NEGATIVE NEGATIVE Final    Comment:        The GeneXpert MRSA  Assay (FDA approved for NASAL specimens only), is one component of a comprehensive MRSA colonization surveillance program. It is not intended to  diagnose MRSA infection nor to guide or monitor treatment for MRSA infections. Performed at [facility], [place] [address] [place], [place] [address]   Culture, blood (routine x 2) Call MD if unable to obtain prior to antibiotics being given     Status: None (Preliminary result)   Collection Time: 05/25/18  3:43 AM  Result Value Ref Range Status   Specimen Description BLOOD RIGHT WRIST  Final   Special Requests   Final    BOTTLES DRAWN AEROBIC AND ANAEROBIC Blood Culture adequate volume   Culture   Final    NO GROWTH 4 DAYS Performed at [facility], [place] [address], [place], [place] [address]    Report Status PENDING  Incomplete  Culture, blood (routine x 2) Call MD if unable to obtain prior to antibiotics being given     Status: None (Preliminary result)   Collection Time: 05/25/18  3:46 AM  Result Value Ref Range Status   Specimen Description BLOOD LEFT ANTECUBITAL  Final   Special Requests   Final    BOTTLES DRAWN AEROBIC AND ANAEROBIC Blood Culture adequate volume   Culture   Final    NO GROWTH 4 DAYS Performed at [facility], [place] [address] [place], [place] [address]    Report Status PENDING  Incomplete      Radiology Studies: No results found.    Scheduled Meds: . benzonatate  100 mg Oral TID  . buPROPion  300 mg Oral [id number]  . dolutegravir  50 mg Oral Daily  . emtricitabine-tenofovir AF  1 tablet Oral Daily  . insulin aspart  0-9 Units Subcutaneous TID WC  . metoprolol tartrate  25 mg Oral BID  . pravastatin  40 mg Oral QPM  . sertraline  50 mg Oral Daily  . sodium chloride flush  3 mL Intravenous Q12H  . sodium chloride  2 g Oral TID WC   Continuous Infusions: . sodium chloride Stopped (05/29/18 1454)     LOS: 6 days    Bonnielee Haff, MD [facility] 05/30/2018, 12:37 PM

## 2018-05-31 LAB — BASIC METABOLIC PANEL
Anion gap: 12 (ref 5–15)
BUN: 14 mg/dL (ref 6–20)
CALCIUM: 8.5 mg/dL — AB (ref 8.9–10.3)
CO2: 18 mmol/L — ABNORMAL LOW (ref 22–32)
Chloride: 101 mmol/L (ref 98–111)
Creatinine, Ser: 0.83 mg/dL (ref 0.61–1.24)
GFR calc Af Amer: >60 mL/min (ref >60)
GFR calc non Af Amer: >60 mL/min (ref >60)
Glucose, Bld: 112 mg/dL — ABNORMAL HIGH (ref 70–99)
Potassium: 3.3 mmol/L — ABNORMAL LOW (ref 3.5–5.1)
Sodium: 131 mmol/L — ABNORMAL LOW (ref 135–145)

## 2018-05-31 LAB — GLUCOSE, CAPILLARY
Glucose-Capillary: 110 mg/dL — ABNORMAL HIGH (ref 70–99)
Glucose-Capillary: 96 mg/dL (ref 70–99)
Glucose-Capillary: 121 mg/dL — ABNORMAL HIGH (ref 70–99)
Glucose-Capillary: 165 mg/dL — ABNORMAL HIGH (ref 70–99)

## 2018-05-31 MED ORDER — POTASSIUM CHLORIDE CRYS ER 20 MEQ PO TBCR
40.0000 meq | EXTENDED_RELEASE_TABLET | Freq: Once | ORAL | Status: AC
  Administered 2018-05-31: 40 meq via ORAL
  Filled 2018-05-31: qty 2

## 2018-05-31 NOTE — Progress Notes (Signed)
  Radiation Oncology         ([phone] ________________________________  Name: [name] MRN: [id number]  Date: 05/27/2018  DOB: 20-Nov-1965  SIMULATION AND TREATMENT PLANNING NOTE    ICD-10-CM   1. 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  Identity was confirmed.  All relevant records and images related to the planned course of therapy were reviewed.  The patient freely provided informed written consent to proceed with treatment after reviewing the details related to the planned course of therapy. The consent form was witnessed and verified by the simulation staff.  Then, the patient was set-up in a stable reproducible  supine position for radiation therapy.  CT images were obtained.  Surface markings were placed.  The CT images were loaded into the planning software.  Then the target and avoidance structures were contoured.  Treatment planning then occurred.  The radiation prescription was entered and confirmed.  Then, I designed and supervised the construction of a total of 6 medically necessary complex treatment devices, including a BodyFix immobilization mold custom fitted to the patient along with 5 multileaf collimators conformally shaped radiation around the treatment target while shielding critical structures such as the heart and spinal cord maximally.  I have requested : 3D Simulation  I have requested a DVH of the following structures: Left lung, right lung, spinal cord, heart, esophagus, and target.  I have ordered:Nutrition Consult  PLAN:  The patient will receive 30 Gy in 10 fractions.  ________________________________  Sheral Apley Tammi Klippel, M.D.

## 2018-05-31 NOTE — Progress Notes (Signed)
PROGRESS NOTE    [name]  XLK:[id number] DOB: April 19, 1966 DOA: 05/24/2018 PCP: Dylan Orn, MD      Brief Narrative:  Mr. [name] is a 53 y.o. M with HIV well controlled and CAD s/p CABG 2018, presented with headaches and now malaise, nausea, vomiting and RUQ abdominal pain.  Evaluation was concerning for pneumonia.  He underwent CT abdomen due to presence of nausea vomiting and abdominal pain.  CT abdomen incidentally showed multiple liver lesions and retroperitoneal adenopathy.  Started on broad spectrum antibiotics for SIRS.  Subjective:  Patient asking why his potassium level is low this morning.  Otherwise he feels well.  Shortness of breath is better.  But he still gets dyspneic with exertion.  Cough is better.     Assessment & Plan:  Sepsis from pneumonia Suspected source pneumonia. Cultures negative. Patient met criteria given tachycardia, tachypnea, leukocytosis, and evidence of organ dysfunction.  Lactate 2.1 mmol/L and washed out with fluids.  Respiratory viral panel negative.  UA was unremarkable.  CT chest report suggests left lung airspace opacities seen on CXR may be lymphangitis spread, discussed with Oncology who felt associated pneumonia was likely.  Patient completed 5 days of treatment with ceftriaxone and azithromycin.  Cough and shortness of breath has improved.   Nausea and vomiting Abdomen is benign on examination.  Mildly tender in the right upper quadrant presumably from his liver metastases.  He had a CT scan of abdomen done a few days ago which revealed the metastatic process but no bowel abnormalities were noted.  His symptoms most likely are due to this metastatic process.  Treating symptomatically for now.    Hyponatremia likely secondary to SIADH Patient was noted to be hyponatremic with a sodium levels in the 130s.  Sodium level decreased to 127 on 2/7. Since he was experiencing some nausea and vomiting there was felt to be an element of hypovolemia.  Patient  was given IV fluids without much improvement.  His urine osmolality was noted to be 629.  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  Patient placed on fluid restriction.  This did not result in any improvement.  So patient was given salt tablets and was given a dose of Lasix yesterday.  Sodium has improved to 131 this morning.  Potassium noted to be low which will be repleted.  Recheck tomorrow.  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  Patient seen by medical oncology.  Undergone biopsy as he had been on Plavix.  Plavix was held.  Plan is to do liver biopsy on Monday.  CT shows right hilar lung mass with local invasion.  MRI brain shows calavarial lesions and changes to hypothalamus.   Patient seen by radiation oncology.  The mass appears to be exerting pressure on his airway and so it was felt that he would benefit from starting radiation treatment sooner than later.  Plan is to give him radiation treatment for 10 days.  This was started on 2/5.     History of HIV Patient apparently had not been compliant with his antiretroviral treatment.  CD4 370. Continue Descovy and Tivicay.  Coronary artery disease status post CABG Stable.  Followed by cardiology as outpatient.  Holding Plavix and aspirin for biopsy purposes.  Continue statin.  Continue beta-blocker.  ACE inhibitor on hold.  EF is normal based on last echocardiogram in 2018.  Essential hypertension  As above.  Monitor blood pressures closely.  Diabetes mellitus type II Holding Jardiance and metformin.  Continue SSI.  Monitor CBGs.  CBGs are reasonably well controlled.  History of depression and anxiety Continue sertraline, Wellbutrin.  Abnormal LFTs Most likely due to his liver lesions.  Recheck tomorrow.   DVT prophylaxis: SCDs Code Status: Full code Family Communication: Discussed with the patient MDM and  disposition Plan: Management as outlined above.  Biopsy tomorrow.  Sodium level improved.  Hopefully discharge either later tomorrow or on Tuesday.     Objective: Vitals:   05/30/18 0500 05/30/18 1411 05/30/18 2123 05/31/18 0604  BP: 138/89 111/72 124/71 129/72  Pulse: 89 80 86 83  Resp: _0 Temp: 98.1 F (36.7 C) 98 F (36.7 C) 98.3 F (36.8 C) 98.1 F (36.7 C)  TempSrc: Oral Oral Oral Oral  SpO2: 95% 94% 97% 93%  Weight:      Height:        Intake/Output Summary (Last 24 hours) at 05/31/2018 1300 Last data filed at 05/31/2018 0752 Gross per 24 hour  Intake 1020 ml  Output -  Net 1020 ml   Filed Weights   05/25/18 0225 05/26/18 1319  Weight: 128.3 kg 127.3 kg    Examination: BP 129/72 (BP Location: Left Arm)   Pulse 83   Temp 98.1 F (36.7 C) (Oral)   Resp 17   Ht _1  (1.753 m)   Wt 127.3 kg   SpO2 93%   BMI 41.44 kg/m    General appearance: Awake alert.  In no distress Resp: Normal effort.  No wheezing rales or rhonchi. Cardio: S1-S2 is normal regular.  No S3-S4.  No rubs murmurs or bruit GI: Abdomen is soft.  Remains tender in the right upper quadrant with hepatomegaly.  No rebound rigidity or guarding.  Bowel sounds present normal.   Extremities: No edema.  Full range of motion of lower extremities. Neurologic: Alert and oriented x3.  No focal neurological deficits.    Data Reviewed: I have personally reviewed following labs and imaging studies:  CBC: Recent Labs  Lab 05/24/18 1553 05/25/18 0343 05/26/18 0313 05/27/18 0340 05/28/18 0340 05/29/18 0345  WBC 16.3* 11.3* 9.8 8.8 8.0 7.5  NEUTROABS 10.8* 6.8  --   --   --   --   HGB 15.4 13.5 13.3 13.4 13.9 14.6  HCT 48.6 42.8 42.1 42.8 43.5 45.2  MCV 85.0 85.3 84.2 85.9 85.5 85.3  PLT 309 236 238 263 286 778   Basic Metabolic Panel: Recent Labs  Lab 05/27/18 0340 05/28/18 0340 05/29/18 0345 05/30/18 0510 05/31/18 0536  NA 131* 127* 126* 126* 131*  K 3.6 3.7 3.7 3.7 3.3*  CL 101 98  97* 97* 101  CO2 18* 19* 17* 18* 18*  GLUCOSE 119* 123* 119* 106* 112*  BUN _2 CREATININE 0.68 0.63 0.60* 0.81 0.83  CALCIUM 8.4* 8.4* 8.4* 8.5* 8.5*   GFR: Estimated Creatinine Clearance: 134.3 mL/min (by C-G formula based on SCr of 0.83 mg/dL).  Liver Function Tests: Recent Labs  Lab 05/24/18 1553 05/25/18 0343  AST 128* 84*  ALT 109* 83*  ALKPHOS 476* 334*  BILITOT 1.0 0.9  PROT 7.7 6.4*  ALBUMIN 3.7 2.9*   Recent Labs  Lab 05/24/18 1553  LIPASE 63*   Coagulation Profile: Recent Labs  Lab 05/25/18 1059  INR 1.08   CBG: Recent Labs  Lab 05/30/18 1149 05/30/18 1707 05/30/18 2124 05/31/18 0752 05/31/18  [place]   Urine analysis:    Component Value Date/Time   COLORURINE YELLOW 05/24/2018 2130   APPEARANCEUR CLEAR 05/24/2018 2130   LABSPEC <1.005 (L) 05/24/2018 2130   PHURINE 6.0 05/24/2018 2130   GLUCOSEU >=500 (A) 05/24/2018 2130   HGBUR NEGATIVE 05/24/2018 2130   BILIRUBINUR NEGATIVE 05/24/2018 2130   KETONESUR NEGATIVE 05/24/2018 2130   PROTEINUR 30 (A) 05/24/2018 2130   UROBILINOGEN 1.0 06/29/2014 2245   NITRITE NEGATIVE 05/24/2018 2130   LEUKOCYTESUR NEGATIVE 05/24/2018 2130    Recent Results (from the past 240 hour(s))  Blood Culture (routine x 2)     Status: None   Collection Time: 05/24/18  4:06 PM  Result Value Ref Range Status   Specimen Description   Final    BLOOD RIGHT ANTECUBITAL Performed at [facility], [address] [place], [place] [address]    Special Requests   Final    BOTTLES DRAWN AEROBIC AND ANAEROBIC Blood Culture adequate volume Performed at [facility], [address] [place], [place] [address]    Culture   Final    NO GROWTH 5 DAYS Performed at [facility], [place] [address], [place], [place] [address]    Report Status 05/29/2018 FINAL  Final  Blood Culture (routine x 2)     Status: None   Collection Time: 05/24/18  4:45 PM  Result Value Ref  Range Status   Specimen Description   Final    BLOOD BLOOD RIGHT HAND Performed at [facility], [address] [place], [place] [address]    Special Requests   Final    BOTTLES DRAWN AEROBIC AND ANAEROBIC Blood Culture adequate volume Performed at [facility], [address] [place], [place] [address]    Culture   Final    NO GROWTH 5 DAYS Performed at [facility], [place] [address], [place], [place] [address]    Report Status 05/29/2018 FINAL  Final  Respiratory Panel by PCR     Status: None   Collection Time: 05/24/18  6:49 PM  Result Value Ref Range Status   Adenovirus NOT DETECTED NOT DETECTED Final   Coronavirus 229E NOT DETECTED NOT DETECTED Final   Coronavirus HKU1 NOT DETECTED NOT DETECTED Final   Coronavirus NL63 NOT DETECTED NOT DETECTED Final   Coronavirus OC43 NOT DETECTED NOT DETECTED Final   Metapneumovirus NOT DETECTED NOT DETECTED Final   Rhinovirus / Enterovirus NOT DETECTED NOT DETECTED Final   Influenza A NOT DETECTED NOT DETECTED Final   Influenza B NOT DETECTED NOT DETECTED Final   Parainfluenza Virus 1 NOT DETECTED NOT DETECTED Final   Parainfluenza Virus 2 NOT DETECTED NOT DETECTED Final   Parainfluenza Virus 3 NOT DETECTED NOT DETECTED Final   Parainfluenza Virus 4 NOT DETECTED NOT DETECTED Final   Respiratory Syncytial Virus NOT DETECTED NOT DETECTED Final   Bordetella pertussis NOT DETECTED NOT DETECTED Final   Chlamydophila pneumoniae NOT DETECTED NOT DETECTED Final   Mycoplasma pneumoniae NOT DETECTED NOT DETECTED Final    Comment: Performed at [facility], [place] [address] [place], [place] [address]  MRSA PCR Screening     Status: None   Collection Time: 05/25/18  2:22 AM  Result Value Ref Range Status   MRSA by PCR NEGATIVE NEGATIVE Final    Comment:        The GeneXpert MRSA Assay (FDA approved for  NASAL specimens only), is one component of a comprehensive MRSA colonization surveillance program. It is  not intended to diagnose MRSA infection nor to guide or monitor treatment for MRSA infections. Performed at [facility], [place] [address], [place], [place] [address]   Culture, blood (routine x 2) Call MD if unable to obtain prior to antibiotics being given     Status: None   Collection Time: 05/25/18  3:43 AM  Result Value Ref Range Status   Specimen Description BLOOD RIGHT WRIST  Final   Special Requests   Final    BOTTLES DRAWN AEROBIC AND ANAEROBIC Blood Culture adequate volume   Culture   Final    NO GROWTH 5 DAYS Performed at [facility], [place] [address] [place], [place] [address]    Report Status 05/30/2018 FINAL  Final  Culture, blood (routine x 2) Call MD if unable to obtain prior to antibiotics being given     Status: None   Collection Time: 05/25/18  3:46 AM  Result Value Ref Range Status   Specimen Description BLOOD LEFT ANTECUBITAL  Final   Special Requests   Final    BOTTLES DRAWN AEROBIC AND ANAEROBIC Blood Culture adequate volume   Culture   Final    NO GROWTH 5 DAYS Performed at [facility], [place] [address] [place], [place] [address]    Report Status 05/30/2018 FINAL  Final      Radiology Studies: No results found.    Scheduled Meds: . benzonatate  100 mg Oral TID  . buPROPion  300 mg Oral [id number]  . dolutegravir  50 mg Oral Daily  . emtricitabine-tenofovir AF  1 tablet Oral Daily  . insulin aspart  0-9 Units Subcutaneous TID WC  . metoprolol tartrate  25 mg Oral BID  . pravastatin  40 mg Oral QPM  . sertraline  50 mg Oral Daily  . sodium chloride flush  3 mL Intravenous Q12H  . sodium chloride  2 g Oral TID WC   Continuous Infusions: . sodium chloride Stopped (05/29/18 1454)     LOS: 7 days    Bonnielee Haff, MD [facility] 05/31/2018, 1:00 PM

## 2018-06-01 ENCOUNTER — Inpatient Hospital Stay (HOSPITAL_COMMUNITY): Payer: 59

## 2018-06-01 ENCOUNTER — Ambulatory Visit
Admit: 2018-06-01 | Discharge: 2018-06-01 | Disposition: A | Discharge status: Home / Self Care | Payer: 59 | Attending: Radiation Oncology | Admitting: Radiation Oncology

## 2018-06-01 ENCOUNTER — Encounter (HOSPITAL_COMMUNITY): Payer: Self-pay | Admitting: Interventional Radiology

## 2018-06-01 HISTORY — PX: IR US GUIDE BX ASP/DRAIN: IMG2392

## 2018-06-01 LAB — COMPREHENSIVE METABOLIC PANEL
ALT: 88 U/L — ABNORMAL HIGH (ref 0–44)
AST: 112 U/L — ABNORMAL HIGH (ref 15–41)
Albumin: 3.4 g/dL — ABNORMAL LOW (ref 3.5–5.0)
Alkaline Phosphatase: 468 U/L — ABNORMAL HIGH (ref 38–126)
Anion gap: 11 (ref 5–15)
BUN: 10 mg/dL (ref 6–20)
CALCIUM: 8.6 mg/dL — AB (ref 8.9–10.3)
CO2: 22 mmol/L (ref 22–32)
Chloride: 101 mmol/L (ref 98–111)
Creatinine, Ser: 0.82 mg/dL (ref 0.61–1.24)
GFR calc Af Amer: >60 mL/min (ref >60)
GFR calc non Af Amer: >60 mL/min (ref >60)
Glucose, Bld: 119 mg/dL — ABNORMAL HIGH (ref 70–99)
Potassium: 3.6 mmol/L (ref 3.5–5.1)
SODIUM: 134 mmol/L — AB (ref 135–145)
Total Bilirubin: 0.9 mg/dL (ref 0.3–1.2)
Total Protein: 7.2 g/dL (ref 6.5–8.1)

## 2018-06-01 LAB — CBC WITH DIFFERENTIAL/PLATELET
Abs Immature Granulocytes: 0.04 10*3/uL (ref 0.00–0.07)
Basophils Absolute: 0.1 10*3/uL (ref 0.0–0.1)
Basophils Relative: 2 %
Eosinophils Absolute: 0 10*3/uL (ref 0.0–0.5)
Eosinophils Relative: 1 %
HEMATOCRIT: 45.8 % (ref 39.0–52.0)
Hemoglobin: 14.6 g/dL (ref 13.0–17.0)
IMMATURE GRANULOCYTES: 1 %
LYMPHS ABS: 1.4 10*3/uL (ref 0.7–4.0)
Lymphocytes Relative: 26 %
MCH: 27.1 pg (ref 26.0–34.0)
MCHC: 31.9 g/dL (ref 30.0–36.0)
MCV: 85.1 fL (ref 80.0–100.0)
Monocytes Absolute: 0.5 10*3/uL (ref 0.1–1.0)
Monocytes Relative: 8 %
Neutro Abs: 3.6 10*3/uL (ref 1.7–7.7)
Neutrophils Relative %: 62 %
Platelets: 262 10*3/uL (ref 150–400)
RBC: 5.38 MIL/uL (ref 4.22–5.81)
RDW: 14.9 % (ref 11.5–15.5)
WBC: 5.6 10*3/uL (ref 4.0–10.5)
nRBC: 0 % (ref 0.0–0.2)

## 2018-06-01 LAB — GLUCOSE, CAPILLARY
Glucose-Capillary: 119 mg/dL — ABNORMAL HIGH (ref 70–99)
Glucose-Capillary: 120 mg/dL — ABNORMAL HIGH (ref 70–99)
Glucose-Capillary: 147 mg/dL — ABNORMAL HIGH (ref 70–99)
Glucose-Capillary: 156 mg/dL — ABNORMAL HIGH (ref 70–99)

## 2018-06-01 LAB — PROTIME-INR
INR: 0.97
Prothrombin Time: 12.8 seconds (ref 11.4–15.2)

## 2018-06-01 MED ORDER — GELATIN ABSORBABLE 12-7 MM EX MISC
CUTANEOUS | Status: AC
  Filled 2018-06-01: qty 1

## 2018-06-01 MED ORDER — MIDAZOLAM HCL 2 MG/2ML IJ SOLN
INTRAMUSCULAR | Status: AC | PRN
  Administered 2018-06-01 (×2): 1 mg via INTRAVENOUS

## 2018-06-01 MED ORDER — FENTANYL CITRATE (PF) 100 MCG/2ML IJ SOLN
INTRAMUSCULAR | Status: AC
  Filled 2018-06-01: qty 2

## 2018-06-01 MED ORDER — GELATIN ABSORBABLE 12-7 MM EX MISC
CUTANEOUS | Status: AC | PRN
  Administered 2018-06-01: 1 via TOPICAL

## 2018-06-01 MED ORDER — LIDOCAINE-EPINEPHRINE (PF) 2 %-1:200000 IJ SOLN
INTRAMUSCULAR | Status: AC
  Filled 2018-06-01: qty 20

## 2018-06-01 MED ORDER — MIDAZOLAM HCL 2 MG/2ML IJ SOLN
INTRAMUSCULAR | Status: AC
  Filled 2018-06-01: qty 2

## 2018-06-01 MED ORDER — FENTANYL CITRATE (PF) 100 MCG/2ML IJ SOLN
INTRAMUSCULAR | Status: AC | PRN
  Administered 2018-06-01 (×2): 50 ug via INTRAVENOUS

## 2018-06-01 MED ORDER — LIDOCAINE-EPINEPHRINE (PF) 1 %-1:200000 IJ SOLN
INTRAMUSCULAR | Status: AC | PRN
  Administered 2018-06-01: 10 mL

## 2018-06-01 NOTE — Progress Notes (Addendum)
Patient ID: [name], male   DOB: [birth year], 53 y.o.   MRN: [id number] Patient scheduled for image guided liver lesion biopsy today; see full consult note from 05/25/18 for details;  VSS; AF;labs ok; awake/alert; chest- sl dim BS left base, right clear; heart- RRR; abd - soft,+BS, mildly tender RUQ; consent signed; pt has no further questions regarding procedure.

## 2018-06-01 NOTE — Progress Notes (Signed)
[facility] Therapy Treatment Record Phone [phone]   Radiation Therapy was administered to [name] on: 06/01/2018  4:13 PM and was treatment # 4 out of a planned course of 10 treatments.  Radiation Treatment  1). Beam photons with 6-10 energy  2). Brachytherapy None  3). Stereotactic Radiosurgery None  4). Other Radiation None     Paulla Fore, RT (T)

## 2018-06-01 NOTE — Discharge Instructions (Addendum)
Liver Biopsy, Care After °These instructions give you information on caring for yourself after your procedure. Your doctor may also give you more specific instructions. Call your doctor if you have any problems or questions after your procedure. °What can I expect after the procedure? °After the procedure, it is common to have: °· Pain and soreness where the biopsy was done. °· Bruising around the area where the biopsy was done. °· Sleepiness and be tired for a few days. °Follow these instructions at home: °Medicines °· Take over-the-counter and prescription medicines only as told by your doctor. °· If you were prescribed an antibiotic medicine, take it as told by your doctor. Do not stop taking the antibiotic even if you start to feel better. °· Do not take medicines such as aspirin and ibuprofen. These medicines can thin your blood. Do not take these medicines unless your doctor tells you to take them. °· If you are taking prescription pain medicine, take actions to prevent or treat constipation. Your doctor may recommend that you: °? Drink enough fluid to keep your pee (urine) clear or pale yellow. °? Take over-the-counter or prescription medicines. °? Eat foods that are high in fiber, such as fresh fruits and vegetables, whole grains, and beans. °? Limit foods that are high in fat and processed sugars, such as fried and sweet foods. °Caring for your cut °· Follow instructions from your doctor about how to take care of your cuts from surgery (incisions). Make sure you: °? Wash your hands with soap and water before you change your bandage (dressing). If you cannot use soap and water, use hand sanitizer. °? Change your bandage as told by your doctor. °? Leave stitches (sutures), skin glue, or skin tape (adhesive) strips in place. They may need to stay in place for 2 weeks or longer. If tape strips get loose and curl up, you may trim the loose edges. Do not remove tape strips completely unless your doctor says it is  okay. °· Check your cuts every day for signs of infection. Check for: °? Redness, swelling, or more pain. °? Fluid or blood. °? Pus or a bad smell. °? Warmth. °· Do not take baths, swim, or use a hot tub until your doctor says it is okay to do so. °Activity ° °· Rest at home for 1-2 days or as told by your doctor. °? Avoid sitting for a long time without moving. Get up to take short walks every 1-2 hours. °· Return to your normal activities as told by your doctor. Ask what activities are safe for you. °· Do not do these things in the first 24 hours: °? Drive. °? Use machinery. °? Take a bath or shower. °· Do not lift more than 10 pounds (4.5 kg) or play contact sports for the first 2 weeks. °General instructions ° °· Do not drink alcohol in the first week after the procedure. °· Have someone stay with you for at least 24 hours after the procedure. °· Get your test results. Ask your doctor or the department that is doing the test: °? When will my results be ready? °? How will I get my results? °? What are my treatment options? °? What other tests do I need? °? What are my next steps? °· Keep all follow-up visits as told by your doctor. This is important. °Contact a doctor if: °· A cut bleeds and leaves more than just a small spot of blood. °· A cut is red, puffs up (  swells), or hurts more than before. °· Fluid or something else comes from a cut. °· A cut smells bad. °· You have a fever or chills. °Get help right away if: °· You have swelling, bloating, or pain in your belly (abdomen). °· You get dizzy or faint. °· You have a rash. °· You feel sick to your stomach (nauseous) or throw up (vomit). °· You have trouble breathing, feel short of breath, or feel faint. °· Your chest hurts. °· You have problems talking or seeing. °· You have trouble with your balance or moving your arms or legs. °Summary °· After the procedure, it is common to have pain, soreness, bruising, and tiredness. °· Your doctor will tell you how to  take care of yourself at home. Change your bandage, take your medicines, and limit your activities as told by your doctor. °· Call your doctor if you have symptoms of infection. Get help right away if your belly swells, your cut bleeds a lot, or you have trouble talking or breathing. °This information is not intended to replace advice given to you by your health care provider. Make sure you discuss any questions you have with your health care provider. °Document Released: 01/16/2008 Document Revised: 04/18/2017 Document Reviewed: 04/18/2017 °Elsevier Interactive Patient Education © 2019 [employer] °Moderate Conscious Sedation, Adult, Care After °These instructions provide you with information about caring for yourself after your procedure. Your health care provider may also give you more specific instructions. Your treatment has been planned according to current medical practices, but problems sometimes occur. Call your health care provider if you have any problems or questions after your procedure. °What can I expect after the procedure? °After your procedure, it is common: °· To feel sleepy for several hours. °· To feel clumsy and have poor balance for several hours. °· To have poor judgment for several hours. °· To vomit if you eat too soon. °Follow these instructions at home: °For at least 24 hours after the procedure: ° °· Do not: °? Participate in activities where you could fall or become injured. °? Drive. °? Use heavy machinery. °? Drink alcohol. °? Take sleeping pills or medicines that cause drowsiness. °? Make important decisions or sign legal documents. °? Take care of children on your own. °· Rest. °Eating and drinking °· Follow the diet recommended by your health care provider. °· If you vomit: °? Drink water, juice, or soup when you can drink without vomiting. °? Make sure you have little or no nausea before eating solid foods. °General instructions °· Have a responsible adult stay with you until  you are awake and alert. °· Take over-the-counter and prescription medicines only as told by your health care provider. °· If you smoke, do not smoke without supervision. °· Keep all follow-up visits as told by your health care provider. This is important. °Contact a health care provider if: °· You keep feeling nauseous or you keep vomiting. °· You feel light-headed. °· You develop a rash. °· You have a fever. °Get help right away if: °· You have trouble breathing. °This information is not intended to replace advice given to you by your health care provider. Make sure you discuss any questions you have with your health care provider. °Document Released: 01/27/2013 Document Revised: 09/11/2015 Document Reviewed: 07/29/2015 °Elsevier Interactive Patient Education © 2019 [employer] ° °

## 2018-06-01 NOTE — Progress Notes (Signed)
PROGRESS NOTE    [name]  UKG:[id number] DOB: Jan 16, 1966 DOA: 05/24/2018 PCP: Lavone Orn, MD      Brief Narrative:  Mr. Hamberger is a 53 y.o. M with HIV well controlled and CAD s/p CABG 2018, presented with headaches and now malaise, nausea, vomiting and RUQ abdominal pain.  Evaluation was concerning for pneumonia.  He underwent CT abdomen due to presence of nausea vomiting and abdominal pain.  CT abdomen incidentally showed multiple liver lesions and retroperitoneal adenopathy.  Started on broad spectrum antibiotics for SIRS.  Subjective:  Patient feels well.  Occasional episodes of nausea but no vomiting in the last 48 hours.  Shortness of breath and cough has improved.    Assessment & Plan:  Sepsis from pneumonia Suspected source pneumonia. Cultures negative. Patient met criteria for sepsis given tachycardia, tachypnea, leukocytosis, and evidence of organ dysfunction.  Lactate 2.1 mmol/L and washed out with fluids.  Respiratory viral panel negative.  UA was unremarkable.  CT chest report suggests left lung airspace opacities seen on CXR may be lymphangitis spread, discussed with Oncology who felt associated pneumonia was likely.  Patient completed 5 days of treatment with ceftriaxone and azithromycin.  Cough and shortness of breath has improved.  Patient's respiratory status remains stable.  Nausea and vomiting Abdomen is benign on examination.  Mildly tender in the right upper quadrant presumably from his liver metastases.  He had a CT scan of abdomen done a few days ago which revealed the metastatic process but no bowel abnormalities were noted.  His symptoms most likely are due to this metastatic process.  Symptoms controlled with Zofran.  Hyponatremia likely secondary to SIADH Patient was noted to be hyponatremic with a sodium levels in the 130s.  Sodium level decreased to 127 on 2/7. Since he was experiencing some nausea and vomiting there was felt to be an element of hypovolemia.   Patient was given IV fluids without much improvement.  His urine osmolality was noted to be 629.  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  Patient placed on fluid restriction.  This did not result in any improvement.  So patient was given salt tablets and was given a dose of Lasix.  Sodium level has improved.  Continue salt tablets.  Potassium level is normal today.   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  Patient seen by medical oncology.  Plan is to do a biopsy.  Could not be done last week as the patient had been on Plavix.  Plavix was held.  Liver biopsy to be done this morning.  CT shows right hilar lung mass with local invasion.  MRI brain shows calavarial lesions and changes to hypothalamus.   Patient seen by radiation oncology.  The mass appears to be exerting pressure on his airway and so it was felt that he would benefit from starting radiation treatment sooner than later.  Patient was started on radiation treatment on 05/27/2018.  Plan is for 10 treatments.   Medical oncology will follow the patient later this week in their office.  Discussed with Dr. Alvy Bimler.  History of HIV Patient apparently had not been compliant with his antiretroviral treatment.  CD4 370. Continue Descovy and Tivicay.  Coronary artery disease status post CABG Stable.  Followed by cardiology as outpatient.  Holding Plavix and aspirin for biopsy purposes.  Continue statin.  Continue beta-blocker.  ACE inhibitor on  hold.  EF is normal based on last echocardiogram in 2018.  Will need to know from IR as to when to resume his antiplatelet agents after biopsy.  Essential hypertension As above.  Monitor blood pressures closely.  Diabetes mellitus type II Holding Jardiance and metformin.  Continue SSI.  Monitor CBGs.  CBGs are reasonably well controlled.  History of depression and  anxiety Continue sertraline, Wellbutrin.  Abnormal LFTs Most likely due to his liver lesions.    DVT prophylaxis: SCDs Code Status: Full code Family Communication: Discussed with the patient MDM and disposition Plan: Management as outlined above.  Liver biopsy today.  Followed by radiation treatment.  Check labs tomorrow.  If stable he could be discharged home at that time.     Objective: Vitals:   06/01/18 1115 06/01/18 1120 06/01/18 1125 06/01/18 1129  BP: 135/87 138/89 140/86 139/86  Pulse: 82 82 80 82  Resp: (!) 29 (!) 22 20   Temp:      TempSrc:      SpO2: 97% 97% 96% 93%  Weight:      Height:        Intake/Output Summary (Last 24 hours) at 06/01/2018 1133 Last data filed at 06/01/2018 0549 Gross per 24 hour  Intake 1060.49 ml  Output 1301 ml  Net -240.51 ml   Filed Weights   05/25/18 0225 05/26/18 1319  Weight: 128.3 kg 127.3 kg    Examination: BP 139/86   Pulse 82   Temp 97.8 F (36.6 C) (Oral)   Resp 20   Ht _0  (1.753 m)   Wt 127.3 kg   SpO2 93%   BMI 41.44 kg/m    General appearance: Awake alert.  In no distress Resp: Clear to auscultation bilaterally.  Normal effort Cardio: S1-S2 is normal regular.  No S3-S4.  No rubs murmurs or bruit GI: Abdomen is soft.  Tender in the right upper quadrant with hepatomegaly.  No rebound rigidity or guarding.  Bowel sounds present normal.  Extremities: No edema.  Full range of motion of lower extremities. Neurologic: Alert and oriented x3.  No focal neurological deficits.     Data Reviewed: I have personally reviewed following labs and imaging studies:  CBC: Recent Labs  Lab 05/26/18 0313 05/27/18 0340 05/28/18 0340 05/29/18 0345 06/01/18 0633  WBC 9.8 8.8 8.0 7.5 5.6  NEUTROABS  --   --   --   --  3.6  HGB 13.3 13.4 13.9 14.6 14.6  HCT 42.1 42.8 43.5 45.2 45.8  MCV 84.2 85.9 85.5 85.3 85.1  PLT 238 263 286 288 626   Basic Metabolic Panel: Recent Labs  Lab 05/28/18 0340 05/29/18 0345  05/30/18 0510 05/31/18 0536 06/01/18 0633  NA 127* 126* 126* 131* 134*  K 3.7 3.7 3.7 3.3* 3.6  CL 98 97* 97* 101 101  CO2 19* 17* 18* 18* 22  GLUCOSE 123* 119* 106* 112* 119*  BUN _1 CREATININE 0.63 0.60* 0.81 0.83 0.82  CALCIUM 8.4* 8.4* 8.5* 8.5* 8.6*   GFR: Estimated Creatinine Clearance: 135.9 mL/min (by C-G formula based on SCr of 0.82 mg/dL).  Liver Function Tests: Recent Labs  Lab 06/01/18 0633  AST 112*  ALT 88*  ALKPHOS 468*  BILITOT 0.9  PROT 7.2  ALBUMIN 3.4*   Coagulation Profile: Recent Labs  Lab 06/01/18 0633  INR 0.97   CBG: Recent Labs  Lab 05/31/18 0752 05/31/18 1151 05/31/18 1713 05/31/18 2044 06/01/18 0725  GLUCAP 110* 96  121* 165* 119*   Urine analysis:    Component Value Date/Time   COLORURINE YELLOW 05/24/2018 2130   APPEARANCEUR CLEAR 05/24/2018 2130   LABSPEC <1.005 (L) 05/24/2018 2130   PHURINE 6.0 05/24/2018 2130   GLUCOSEU >=500 (A) 05/24/2018 2130   HGBUR NEGATIVE 05/24/2018 2130   BILIRUBINUR NEGATIVE 05/24/2018 2130   KETONESUR NEGATIVE 05/24/2018 2130   PROTEINUR 30 (A) 05/24/2018 2130   UROBILINOGEN 1.0 06/29/2014 2245   NITRITE NEGATIVE 05/24/2018 2130   LEUKOCYTESUR NEGATIVE 05/24/2018 2130    Recent Results (from the past 240 hour(s))  Blood Culture (routine x 2)     Status: None   Collection Time: 05/24/18  4:06 PM  Result Value Ref Range Status   Specimen Description   Final    BLOOD RIGHT ANTECUBITAL Performed at [facility], [address] [place], [place] [address]    Special Requests   Final    BOTTLES DRAWN AEROBIC AND ANAEROBIC Blood Culture adequate volume Performed at [facility], [address] [place], [place] [address]    Culture   Final    NO GROWTH 5 DAYS Performed at [facility], [place] [address], [place], [place] [address]    Report Status 05/29/2018 FINAL  Final  Blood Culture (routine x 2)     Status: None   Collection Time: 05/24/18  4:45 PM   Result Value Ref Range Status   Specimen Description   Final    BLOOD BLOOD RIGHT HAND Performed at [facility], [address] [place], [place] [address]    Special Requests   Final    BOTTLES DRAWN AEROBIC AND ANAEROBIC Blood Culture adequate volume Performed at [facility], [address] [place], [place] [address]    Culture   Final    NO GROWTH 5 DAYS Performed at [facility], [place] [address] [place], [place] [address]    Report Status 05/29/2018 FINAL  Final  Respiratory Panel by PCR     Status: None   Collection Time: 05/24/18  6:49 PM  Result Value Ref Range Status   Adenovirus NOT DETECTED NOT DETECTED Final   Coronavirus 229E NOT DETECTED NOT DETECTED Final   Coronavirus HKU1 NOT DETECTED NOT DETECTED Final   Coronavirus NL63 NOT DETECTED NOT DETECTED Final   Coronavirus OC43 NOT DETECTED NOT DETECTED Final   Metapneumovirus NOT DETECTED NOT DETECTED Final   Rhinovirus / Enterovirus NOT DETECTED NOT DETECTED Final   Influenza A NOT DETECTED NOT DETECTED Final   Influenza B NOT DETECTED NOT DETECTED Final   Parainfluenza Virus 1 NOT DETECTED NOT DETECTED Final   Parainfluenza Virus 2 NOT DETECTED NOT DETECTED Final   Parainfluenza Virus 3 NOT DETECTED NOT DETECTED Final   Parainfluenza Virus 4 NOT DETECTED NOT DETECTED Final   Respiratory Syncytial Virus NOT DETECTED NOT DETECTED Final   Bordetella pertussis NOT DETECTED NOT DETECTED Final   Chlamydophila pneumoniae NOT DETECTED NOT DETECTED Final   Mycoplasma pneumoniae NOT DETECTED NOT DETECTED Final    Comment: Performed at [facility], [place] [address] [place], [place] [address]  MRSA PCR Screening     Status: None   Collection Time: 05/25/18  2:22 AM  Result Value Ref Range Status   MRSA by PCR NEGATIVE NEGATIVE Final    Comment:        The GeneXpert MRSA Assay (FDA approved for NASAL specimens only), is one  component of a comprehensive MRSA colonization surveillance  program. It is not intended to diagnose MRSA infection nor to guide or monitor treatment for MRSA infections. Performed at [facility], [place] [address] [place], [place] [address]   Culture, blood (routine x 2) Call MD if unable to obtain prior to antibiotics being given     Status: None   Collection Time: 05/25/18  3:43 AM  Result Value Ref Range Status   Specimen Description BLOOD RIGHT WRIST  Final   Special Requests   Final    BOTTLES DRAWN AEROBIC AND ANAEROBIC Blood Culture adequate volume   Culture   Final    NO GROWTH 5 DAYS Performed at [facility], [place] [address], [place], [place] [address]    Report Status 05/30/2018 FINAL  Final  Culture, blood (routine x 2) Call MD if unable to obtain prior to antibiotics being given     Status: None   Collection Time: 05/25/18  3:46 AM  Result Value Ref Range Status   Specimen Description BLOOD LEFT ANTECUBITAL  Final   Special Requests   Final    BOTTLES DRAWN AEROBIC AND ANAEROBIC Blood Culture adequate volume   Culture   Final    NO GROWTH 5 DAYS Performed at [facility], [place] [address] [place],  [address]    Report Status 05/30/2018 FINAL  Final      Radiology Studies: No results found.    Scheduled Meds: . benzonatate  100 mg Oral TID  . buPROPion  300 mg Oral [id number]  . dolutegravir  50 mg Oral Daily  . emtricitabine-tenofovir AF  1 tablet Oral Daily  . gelatin adsorbable      . insulin aspart  0-9 Units Subcutaneous TID WC  . lidocaine-EPINEPHrine      . metoprolol tartrate  25 mg Oral BID  . pravastatin  40 mg Oral QPM  . sertraline  50 mg Oral Daily  . sodium chloride flush  3 mL Intravenous Q12H  . sodium chloride  2 g Oral TID WC   Continuous Infusions: . sodium chloride Stopped (05/29/18 1812)     LOS: 8 days    Bonnielee Haff, MD [facility] 06/01/2018, 11:33 AM

## 2018-06-01 NOTE — Procedures (Signed)
Pre Procedure Dx: Liver lesion, concern for metastatic lung cancer.  Post Procedural Dx: Same  Technically successful US guided biopsy of indeterminate lesion within the right lobe of the liver  EBL: None  No immediate complications.   Ronny Bacon, MD Pager #: [phone]

## 2018-06-02 ENCOUNTER — Ambulatory Visit
Admit: 2018-06-02 | Discharge: 2018-06-02 | Disposition: A | Discharge status: Home / Self Care | Payer: 59 | Attending: Radiation Oncology | Admitting: Radiation Oncology

## 2018-06-02 LAB — BASIC METABOLIC PANEL
ANION GAP: 12 (ref 5–15)
BUN: 14 mg/dL (ref 6–20)
CO2: 20 mmol/L — ABNORMAL LOW (ref 22–32)
Calcium: 8.6 mg/dL — ABNORMAL LOW (ref 8.9–10.3)
Chloride: 100 mmol/L (ref 98–111)
Creatinine, Ser: 0.78 mg/dL (ref 0.61–1.24)
GFR calc Af Amer: >60 mL/min (ref >60)
Glucose, Bld: 138 mg/dL — ABNORMAL HIGH (ref 70–99)
Potassium: 3.9 mmol/L (ref 3.5–5.1)
Sodium: 132 mmol/L — ABNORMAL LOW (ref 135–145)

## 2018-06-02 LAB — GLUCOSE, CAPILLARY
GLUCOSE-CAPILLARY: 136 mg/dL — AB (ref 70–99)
Glucose-Capillary: 123 mg/dL — ABNORMAL HIGH (ref 70–99)

## 2018-06-02 LAB — CBC
HCT: 44.7 % (ref 39.0–52.0)
Hemoglobin: 14.3 g/dL (ref 13.0–17.0)
MCH: 27 pg (ref 26.0–34.0)
MCHC: 32 g/dL (ref 30.0–36.0)
MCV: 84.5 fL (ref 80.0–100.0)
Platelets: 284 10*3/uL (ref 150–400)
RBC: 5.29 MIL/uL (ref 4.22–5.81)
RDW: 14.9 % (ref 11.5–15.5)
WBC: 7.9 10*3/uL (ref 4.0–10.5)
nRBC: 0 % (ref 0.0–0.2)

## 2018-06-02 MED ORDER — POLYETHYLENE GLYCOL 3350 17 G PO PACK: 17.0000 g | PACK | Freq: Every day | ORAL | 0 refills | Status: AC

## 2018-06-02 MED ORDER — BENZONATATE 100 MG PO CAPS: 100.0000 mg | ORAL_CAPSULE | Freq: Three times a day (TID) | ORAL | 0 refills | Status: DC | PRN

## 2018-06-02 MED ORDER — SODIUM CHLORIDE 1 G PO TABS: 2.0000 g | ORAL_TABLET | Freq: Three times a day (TID) | ORAL | 1 refills | Status: AC

## 2018-06-02 MED ORDER — OXYCODONE HCL 5 MG PO TABS: 5.0000 mg | ORAL_TABLET | ORAL | 0 refills | Status: AC | PRN

## 2018-06-02 MED ORDER — SENNA 8.6 MG PO TABS: 1.0000 | ORAL_TABLET | Freq: Every day | ORAL | 0 refills | Status: AC

## 2018-06-02 MED ORDER — OXYCODONE HCL 5 MG PO TABS
10.0000 mg | ORAL_TABLET | Freq: Once | ORAL | Status: AC
  Administered 2018-06-02: 10 mg via ORAL
  Filled 2018-06-02: qty 2

## 2018-06-02 MED ORDER — CLOPIDOGREL BISULFATE 75 MG PO TABS: 75.0000 mg | ORAL_TABLET | Freq: Every day | ORAL | 3 refills | Status: AC

## 2018-06-02 MED ORDER — ONDANSETRON 4 MG PO TBDP: 4.0000 mg | ORAL_TABLET | Freq: Three times a day (TID) | ORAL | 0 refills | Status: DC | PRN

## 2018-06-02 MED ORDER — PROMETHAZINE HCL 25 MG PO TABS: 25.0000 mg | ORAL_TABLET | Freq: Three times a day (TID) | ORAL | 0 refills | Status: AC | PRN

## 2018-06-02 NOTE — Discharge Summary (Addendum)
[facility]  Physician Discharge Summary   Patient ID: [name] MRN: [id number] DOB/AGE: 10-01-1965 53 y.o.  Admit date: 05/24/2018 Discharge date: 06/02/2018  PCP: Lavone Orn, MD  DISCHARGE DIAGNOSES:  Lung mass with liver lesions,  Liver lesions, pathology positive for small cell carcinoma Abdominal pain with nausea vomiting due to metastatic process Hyponatremia secondary to SIADH History of HIV on antiretroviral treatment History of coronary artery disease status post CABG Diabetes mellitus type 2 History of depression and anxiety Essential hypertension   RECOMMENDATIONS FOR OUTPATIENT FOLLOW UP:  1. Patient has an appointment with the oncologist on Friday to discuss the results.   Home Health: None Equipment/Devices: None  CODE STATUS: Full code  DISCHARGE CONDITION: fair  Diet recommendation: Modified carbohydrate  INITIAL HISTORY: [name] is a 53 y.o. M with HIV well controlled and CAD s/p CABG 2018, presented with headaches and now malaise, nausea, vomiting and RUQ abdominal pain.  Evaluation was concerning for pneumonia.  He underwent CT abdomen due to presence of nausea vomiting and abdominal pain.  CT abdomen incidentally showed multiple liver lesions and retroperitoneal adenopathy.  Started on broad spectrum antibiotics for SIRS.  Consultations:  Medical oncology  Interventional radiology  Radiation oncology   Procedures: Liver biopsy Radiation treatments   HOSPITAL COURSE:   Lung mass with liver lesions and calvarial lesions/Hypothalamic infundibulum thickening/Small Cell Carcinoma This is all very concerning for malignancy.  Patient seen by medical oncology.    Liver biopsy was delayed due to to the fact the patient was on aspirin and Plavix.  These medications were held.  Subsequently underwent biopsy on 2/10.  Uneventful.  Patient complains of pain in the right upper abdomen which is most likely due to the liver metastases.   His hemoglobin is stable this morning.  Vital signs have been stable.  No abnormality noted in that area on examination.  CT shows right hilar lung mass with local invasion.  MRI brain shows calavarial lesions and changes to hypothalamus.   Patient seen by radiation oncology.  The mass appears to be exerting pressure on his airway and so it was felt that he would benefit from starting radiation treatment sooner than later.  Patient was started on radiation treatment on 05/27/2018.  Plan is for 10 treatments.   Medical oncology will follow the patient later this week in their office.  Discussed with Dr. Alvy Bimler.  ADDENDUM Pathology is positive for small cell carcinoma.  Sepsis from pneumonia Suspected source pneumonia. Cultures negative. Patient met criteria for sepsis given tachycardia, tachypnea, leukocytosis, and evidence of organ dysfunction.  Lactate 2.1 mmol/L and washed out with fluids.  Respiratory viral panel negative.  UA was unremarkable.  CT chest report suggests left lung airspace opacities seen on CXR may be lymphangitis spread, discussed with Oncology who felt associated pneumonia was likely.  Patient completed 5 days of treatment with ceftriaxone and azithromycin.  Cough and shortness of breath has improved.  Patient's respiratory status remains stable.  Nausea and vomiting Abdomen is benign on examination.  Mildly tender in the right upper quadrant presumably from his liver metastases.  He had a CT scan of abdomen done a few days ago which revealed the metastatic process but no bowel abnormalities were noted.  His symptoms most likely are due to this metastatic process.  He will be prescribed antiemetics to take as needed.  He has been tolerating his diet for the last 2 days.  Hyponatremia likely secondary to SIADH Patient was noted  to be hyponatremic with a sodium levels in the 130s.  Sodium level decreased to 127 on 2/7. Since he was experiencing some nausea and vomiting there was felt  to be an element of hypovolemia.  Patient was given IV fluids without much improvement.  His urine osmolality was noted to be 629.  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  Patient placed on fluid restriction.  This did not result in any improvement.  So patient was given salt tablets and was given a dose of Lasix.  Sodium level has improved.  Continue salt tablets.    Potassium levels also normal.  History of HIV Patient apparently had not been compliant with his antiretroviral treatment.  CD4 370. Continue Descovy and Tivicay.  Coronary artery disease status post CABG Stable.  Followed by cardiology as outpatient.  Holding Plavix and aspirin for biopsy purposes.  Continue statin.  Continue beta-blocker.  ACE inhibitor on hold.  EF is normal based on last echocardiogram in 2018.   Okay to resume his antiplatelets tomorrow.  Hemoglobin is stable this morning.  Essential hypertension Stable  Diabetes mellitus type II May resume home medications  History of depression and anxiety Continue sertraline, Wellbutrin.  Abnormal LFTs Most likely due to his liver lesions.   Overall stable.  Okay for discharge today after he completed today's radiation treatment.     PERTINENT LABS:  The results of significant diagnostics from this hospitalization (including imaging, microbiology, ancillary and laboratory) are listed below for reference.    Microbiology: Recent Results (from the past 240 hour(s))  Blood Culture (routine x 2)     Status: None   Collection Time: 05/24/18  4:06 PM  Result Value Ref Range Status   Specimen Description   Final    BLOOD RIGHT ANTECUBITAL Performed at [facility], [address] [place], [place] [address]    Special Requests   Final    BOTTLES DRAWN AEROBIC AND ANAEROBIC Blood Culture adequate volume Performed at [facility], [address] [place], [place] [address]    Culture   Final     NO GROWTH 5 DAYS Performed at [facility], [place] [address] [place], [place] [address]    Report Status 05/29/2018 FINAL  Final  Blood Culture (routine x 2)     Status: None   Collection Time: 05/24/18  4:45 PM  Result Value Ref Range Status   Specimen Description   Final    BLOOD BLOOD RIGHT HAND Performed at [facility], [address] [place], [place] [address]    Special Requests   Final    BOTTLES DRAWN AEROBIC AND ANAEROBIC Blood Culture adequate volume Performed at [facility], [address] [place], [place] [address]    Culture   Final    NO GROWTH 5 DAYS Performed at [facility], [place] [address] [place], [place] [address]    Report Status 05/29/2018 FINAL  Final  Respiratory Panel by PCR     Status: None   Collection Time: 05/24/18  6:49 PM  Result Value Ref Range Status   Adenovirus NOT DETECTED NOT DETECTED Final   Coronavirus 229E NOT DETECTED NOT DETECTED Final   Coronavirus HKU1 NOT DETECTED NOT DETECTED Final   Coronavirus NL63 NOT DETECTED NOT DETECTED Final   Coronavirus OC43 NOT DETECTED NOT DETECTED Final   Metapneumovirus NOT DETECTED NOT DETECTED  Final   Rhinovirus / Enterovirus NOT DETECTED NOT DETECTED Final   Influenza A NOT DETECTED NOT DETECTED Final   Influenza B NOT DETECTED NOT DETECTED Final   Parainfluenza Virus 1 NOT DETECTED NOT DETECTED Final   Parainfluenza Virus 2 NOT DETECTED NOT DETECTED Final   Parainfluenza Virus 3 NOT DETECTED NOT DETECTED Final   Parainfluenza Virus 4 NOT DETECTED NOT DETECTED Final   Respiratory Syncytial Virus NOT DETECTED NOT DETECTED Final   Bordetella pertussis NOT DETECTED NOT DETECTED Final   Chlamydophila pneumoniae NOT DETECTED NOT DETECTED Final   Mycoplasma pneumoniae NOT DETECTED NOT DETECTED Final    Comment: Performed at [facility], [place] [address], [place], [place] [address]  MRSA PCR Screening     Status: None   Collection Time: 05/25/18  2:22 AM  Result  Value Ref Range Status   MRSA by PCR NEGATIVE NEGATIVE Final    Comment:        The GeneXpert MRSA Assay (FDA approved for NASAL specimens only), is one component of a comprehensive MRSA colonization surveillance program. It is not intended to diagnose MRSA infection nor to guide or monitor treatment for MRSA infections. Performed at [facility], [place] [address] [place], [place] [address]   Culture, blood (routine x 2) Call MD if unable to obtain prior to antibiotics being given     Status: None   Collection Time: 05/25/18  3:43 AM  Result Value Ref Range Status   Specimen Description BLOOD RIGHT WRIST  Final   Special Requests   Final    BOTTLES DRAWN AEROBIC AND ANAEROBIC Blood Culture adequate volume   Culture   Final    NO GROWTH 5 DAYS Performed at [facility], [place] [address] [place], [place] [address]    Report Status 05/30/2018 FINAL  Final  Culture, blood (routine x 2) Call MD if unable to obtain prior to antibiotics being given     Status: None   Collection Time: 05/25/18  3:46 AM  Result Value Ref Range Status   Specimen Description BLOOD LEFT ANTECUBITAL  Final   Special Requests   Final    BOTTLES DRAWN AEROBIC AND ANAEROBIC Blood Culture adequate volume   Culture   Final    NO GROWTH 5 DAYS Performed at [facility], [place] [address], [place], [place] [address]    Report Status 05/30/2018 FINAL  Final     Labs: Basic Metabolic Panel: Recent Labs  Lab 05/29/18 0345 05/30/18 0510 05/31/18 0536 06/01/18 0633 06/02/18 0422  NA 126* 126* 131* 134* 132*  K 3.7 3.7 3.3* 3.6 3.9  CL 97* 97* 101 101 100  CO2 17* 18* 18* 22 20*  GLUCOSE 119* 106* 112* 119* 138*  BUN '7 11 14 10 14  ' CREATININE 0.60* 0.81 0.83 0.82 0.78  CALCIUM 8.4* 8.5* 8.5* 8.6* 8.6*   Liver Function Tests: Recent Labs  Lab 06/01/18 0633  AST 112*  ALT 88*  ALKPHOS 468*  BILITOT 0.9  PROT 7.2  ALBUMIN 3.4*   CBC: Recent Labs  Lab 05/27/18 0340 05/28/18 0340  05/29/18 0345 06/01/18 0633 06/02/18 0422  WBC 8.8 8.0 7.5 5.6 7.9  NEUTROABS  --   --   --  3.6  --   HGB 13.4 13.9 14.6 14.6 14.3  HCT 42.8 43.5 45.2 45.8 44.7  MCV 85.9 85.5 85.3 85.1 84.5  PLT 263 286 288 262 284    CBG: Recent Labs  Lab 06/01/18 1147 06/01/18 1751 06/01/18 2015 06/02/18 0722 06/02/18 1136  GLUCAP 120* 147* 156* 136* 123*     IMAGING STUDIES Dg Chest 2 View  Result Date: 05/24/2018 CLINICAL DATA:  Nausea and vomiting for 2 weeks. Shortness of breath and fever for 1 week. EXAM: CHEST - 2 VIEW COMPARISON:  PA and lateral chest 10/08/2017, 02/18/2011 and 09/02/2016. CT chest 09/02/2016. FINDINGS: There is peribronchial thickening. Increased density projects over the left heart on the frontal view. No pneumothorax pleural effusion. Heart size is normal. The patient is status post CABG. Aortic atherosclerosis is noted. IMPRESSION: Increased density over the left heart is worrisome for lingular pneumonia. Recommend repeat PA and lateral films in 4-6 weeks to ensure resolution. Bronchitic change. Electronically Signed   By: Inge Rise M.D.   On: 05/24/2018 16:21   Ct Head Wo Contrast  Result Date: 05/24/2018 CLINICAL DATA:  Headache and nausea. EXAM: CT HEAD WITHOUT CONTRAST TECHNIQUE: Contiguous axial images were obtained from the base of the skull through the vertex without intravenous contrast. COMPARISON:  05/09/2018 FINDINGS: Brain: there is no evidence for acute hemorrhage, hydrocephalus, mass lesion, or abnormal extra-axial fluid collection. No definite CT evidence for acute infarction. Vascular: No hyperdense vessel or unexpected calcification. Skull: Normal. Negative for fracture or focal lesion. Sinuses/Orbits: No acute finding. Other: None. IMPRESSION: No acute intracranial abnormality. Electronically Signed   By: Misty Stanley M.D.   On: 05/24/2018 17:50   Ct Head Wo Contrast  Result Date: 05/09/2018 CLINICAL DATA:  Headache with onset of nausea and  vomiting today. EXAM: CT HEAD WITHOUT CONTRAST TECHNIQUE: Contiguous axial images were obtained from the base of the skull through the vertex without intravenous contrast. COMPARISON:  10/01/2015 FINDINGS: Brain: Mild low density in the periventricular white matter likely related to small vessel disease. No mass lesion, hemorrhage, hydrocephalus, acute infarct, intra-axial, or extra-axial fluid collection. Vascular: No hyperdense vessel or unexpected calcification. Skull: Normal Sinuses/Orbits: Normal imaged portions of the orbits and globes. Mucosal thickening of right ethmoid air cells is mild. Clear mastoid air cells. Other: None. IMPRESSION: No acute intracranial abnormality. Electronically Signed   By: Abigail Miyamoto M.D.   On: 05/09/2018 21:08   Ct Chest W Contrast  Result Date: 05/25/2018 CLINICAL DATA:  53 year old male with shortness of breath, fever, cough nausea and vomiting. History of HIV. Numerous liver lesion suspected to be metastases on CT Abdomen and Pelvis yesterday with abnormal left lung base. EXAM: CT CHEST WITH CONTRAST TECHNIQUE: Multidetector CT imaging of the chest was performed during intravenous contrast administration. CONTRAST:  72m OMNIPAQUE IOHEXOL 300 MG/ML  SOLN COMPARISON:  05/24/2018 chest radiographs.  Chest CTA 09/02/2016. FINDINGS: Cardiovascular: CABG since the 2018 CT. Mild cardiomegaly. No pericardial effusion. Mediastinum/Nodes: Extensive mediastinal lymphadenopathy, bulky with individual malignant appearing nodal metastases up to 3-3.5 centimeters short axis. Adenopathy continues to the thoracic inlet where abnormal level 4 nodes measure 12 millimeter short axis. Chronic thyromegaly and thyroid heterogeneity appears stable since 2018 and is likely benign. Unilateral left hilar lymphadenopathy which is inseparable from abnormal left lower lobe tumor/opacity. Lungs/Pleura: Heterogeneously enhancing lung and soft tissue about the left hilum with in case mint and compression  of the branching of the left mainstem bronchus (series 4, image 68). Relatively few air bronchograms, mostly tracking inferiorly into the left lower lobe where as the opacity about the right hilum seems more compatible with confluent lung tumor. Superimposed trace layering left pleural effusion and/or irregular pleural thickening. Left lung pulmonary septal thickening which  might indicate tumor related edema or lymphangitic carcinomatosis. No right lung nodule or mass.  No right pleural effusion. Upper Abdomen: Stable. Hepatomegaly. Numerous hypodense liver lesions were better demonstrated on the abdomen CT yesterday. Musculoskeletal: Prior sternotomy. No acute or suspicious bone lesion identified in the chest. IMPRESSION: 1. Advanced Thoracic Malignancy with combined tumor and atelectatic lung about the left hilum tracking into the left lower lobe. Mass effect on the left hilar airways. Bulky bilateral mediastinal lymphadenopathy, continuing to the thoracic inlet. 2. Liver metastases better demonstrated on the abdomen CT yesterday. No skeletal metastasis identified in the chest by CT. 3. Chronic thyroid goiter appears stable since 2018 and benign. Electronically Signed   By: Genevie Ann M.D.   On: 05/25/2018 23:27   Mr Jeri Cos VF Contrast  Result Date: 05/25/2018 CLINICAL DATA:  53 y/o M; HIV, liver lesions, pneumonia, fever, headache, nausea, vomiting, abdominal pain. EXAM: MRI HEAD WITHOUT AND WITH CONTRAST TECHNIQUE: Multiplanar, multiecho pulse sequences of the brain and surrounding structures were obtained without and with intravenous contrast. CONTRAST:  10 cc Gadavist COMPARISON:  05/24/2018 CT head FINDINGS: Brain: Thickening and enhancement of the infundibulum and hypothalamus measuring up to 7 mm (series 18, image 25-27 and series 9, image 12). No additional focus of intracranial abnormal enhancement. No reduced diffusion to suggest acute or early subacute infarction. No abnormal susceptibility  hypointensity to indicate intracranial hemorrhage. No extra-axial collection, hydrocephalus, mass effect, or herniation. Several punctate nonspecific T2 FLAIR hyperintensities in subcortical and periventricular white matter are compatible with mild chronic microvascular ischemic changes. Mild volume loss of the brain. Vascular: Normal flow voids. Skull and upper cervical spine: There several T1 and T2 hypointense lesions within the calvarium demonstrate diffusion hyperintensity and there is appreciable enhancement of the largest lesion in left parietal bone (series 5, image 104 and 105 as well as series 18, image 53). Sinuses/Orbits: Negative. Other: None. IMPRESSION: 1. Several calvarial lesions, the largest lesion in the left parietal bone. Findings are suspicious for metastatic disease. 2. Nonspecific diffuse thickening and enhancement of infundibulum and hypothalamus. Differential includes sarcoidosis, histiocytosis, lymphocytic hypophysitis, infectious process such as syphilis, pseudotumor, lymphoproliferative disease, or metastasis. 3. Mild chronic microvascular ischemic changes and volume loss of the brain. Electronically Signed   By: Kristine Garbe M.D.   On: 05/25/2018 20:01   Ct Abdomen Pelvis W Contrast  Result Date: 05/24/2018 CLINICAL DATA:  Nausea vomiting and abdominal pain EXAM: CT ABDOMEN AND PELVIS WITH CONTRAST TECHNIQUE: Multidetector CT imaging of the abdomen and pelvis was performed using the standard protocol following bolus administration of intravenous contrast. CONTRAST:  111m ISOVUE-300 IOPAMIDOL (ISOVUE-300) INJECTION 61% COMPARISON:  06/29/2014 FINDINGS: Lower chest: 3.7 cm short axis subcarinal lymph node is associated with bulky lymphadenopathy in the left hilum and nodular airspace opacity left lower lobe measuring up to 3.8 cm. Tiny left pleural effusion associated. Hepatobiliary: Liver shows innumerable ill-defined low-density lesions consistent with metastatic  disease. Dominant lesion in the inferior right liver measures 5.6 cm (49/2). Index lesion posterior right liver measures 3.6 cm on 30 /2. There is no evidence for gallstones, gallbladder wall thickening, or pericholecystic fluid. No intrahepatic or extrahepatic biliary dilation. Pancreas: No focal mass lesion. No dilatation of the main duct. No intraparenchymal cyst. No peripancreatic edema. Spleen: No splenomegaly. No focal mass lesion. Adrenals/Urinary Tract: No adrenal nodule or mass. 15 mm exophytic low-density lesion lower pole right kidney is probably a cyst. 3.7 cm lesion interpolar left kidney approaches water attenuation, also likely a cyst.  No evidence for hydroureter. The urinary bladder appears normal for the degree of distention. Stomach/Bowel: Stomach is unremarkable. No gastric wall thickening. No evidence of outlet obstruction. Duodenum is normally positioned as is the ligament of Treitz. No small bowel wall thickening. No small bowel dilatation. The terminal ileum is normal. The appendix is normal. No gross colonic mass. No colonic wall thickening. Vascular/Lymphatic: There is abdominal aortic atherosclerosis without aneurysm. Bulky lymphadenopathy is seen in the hepato duodenal ligament. Index 2.9 cm short axis lymph node identified on 39/2. 2.5 cm short axis pre caval lymph node visible on 42/2. Para-aortic lymphadenopathy is associated with 1.4 cm short axis aortocaval node visible on 50/2. No pelvic sidewall lymphadenopathy. Reproductive: The prostate gland and seminal vesicles are unremarkable. Other: No intraperitoneal free fluid. Musculoskeletal: Sclerotic lesion in the left femoral neck is stable since prior. No worrisome lytic or sclerotic osseous abnormality. IMPRESSION: 1. Innumerable ill-defined low-density liver lesions consistent with metastatic disease. 2. Bulky lymphadenopathy in the hepato duodenal ligament with para-aortic lymphadenopathy in the retroperitoneal space of the  abdomen, findings consistent with metastatic involvement. 3. Incompletely visualized but markedly enlarged subcarinal lymph node is associated with apparent bulky left hilar lymphadenopathy and airspace disease in the left lower lobe. Dedicated CT chest with contrast material recommended to further evaluate. 4. Bilateral renal cysts. 5.  Aortic Atherosclerois (ICD10-170.0) Electronically Signed   By: Misty Stanley M.D.   On: 05/24/2018 17:46   Ir US Guide Bx Asp/drain  Result Date: 06/01/2018 INDICATION: Concern for metastatic lung cancer. Please perform ultrasound-guided liver lesion biopsy for tissue diagnostic purposes. EXAM: ULTRASOUND GUIDED LIVER LESION BIOPSY COMPARISON:  None. MEDICATIONS: None ANESTHESIA/SEDATION: Fentanyl 100 mcg IV; Versed 2 mg IV Total Moderate Sedation time:  13 Minutes. The patient's level of consciousness and vital signs were monitored continuously by radiology nursing throughout the procedure under my direct supervision. COMPLICATIONS: None immediate. PROCEDURE: Informed written consent was obtained from the patient after a discussion of the risks, benefits and alternatives to treatment. The patient understands and consents the procedure. A timeout was performed prior to the initiation of the procedure. Ultrasound scanning was performed of the right upper abdominal quadrant demonstrates multiple hypoechoic liver lesions and masses. Dominant approximately 3.0 x 2.1 cm hypoechoic lesion within the subcapsular aspect the right lobe of the liver was targeted for biopsy given lesion location and sonographic window. The procedure was planned. The right upper abdominal quadrant was prepped and draped in the usual sterile fashion. The overlying soft tissues were anesthetized with 1% lidocaine with epinephrine. A 17 gauge, 6.8 cm co-axial needle was advanced into a peripheral aspect of the lesion. This was followed by 5 core biopsies with an 18 gauge core device under direct ultrasound  guidance. The coaxial needle tract was embolized with a small amount of Gel-Foam slurry and superficial hemostasis was obtained with manual compression. Post procedural scanning was negative for definitive area of hemorrhage or additional complication. A dressing was placed. The patient tolerated the procedure well without immediate post procedural complication. IMPRESSION: Technically successful ultrasound guided core needle biopsy of indeterminate lesion within the subcapsular aspect the right lobe of the liver. Electronically Signed   By: Sandi Mariscal M.D.   On: 06/01/2018 12:10    DISCHARGE EXAMINATION: Vitals:   06/01/18 1500 06/01/18 1753 06/01/18 2017 06/02/18 0518  BP: 130/82 134/86 (!) 149/96 (!) 159/93  Pulse: 79 81 83 86  Resp: '17 17 17 17  ' Temp: 97.9 F (36.6 C) 97.8 F (36.6 C) 97.7  F (36.5 C) 98.1 F (36.7 C)  TempSrc: Oral Oral Oral Oral  SpO2: 94% 97% 96% 92%  Weight:      Height:       General appearance: alert, cooperative, appears stated age and no distress Resp: clear to auscultation bilaterally Cardio: regular rate and rhythm, S1, S2 normal, no murmur, click, rub or gallop GI: No swelling noted over the right upper quadrant.  No swelling or bruising noted over the biopsy site.  Mildly tender in the right upper quadrant as before.  DISPOSITION: Home  Discharge Instructions    Call MD for:  difficulty breathing, headache or visual disturbances   Complete by:  As directed    Call MD for:  extreme fatigue   Complete by:  As directed    Call MD for:  persistant dizziness or light-headedness   Complete by:  As directed    Call MD for:  persistant nausea and vomiting   Complete by:  As directed    Call MD for:  severe uncontrolled pain   Complete by:  As directed    Call MD for:  temperature >100.4   Complete by:  As directed    Discharge instructions   Complete by:  As directed    Please be sure to keep your appointment with the oncologist on Friday.  They will  go over the results of your pathology report which should be available by then.  Take medications as prescribed.  Seek attention if your pain gets significantly worse or if you develop dizziness or lightheadedness.  You were cared for by a hospitalist during your hospital stay. If you have any questions about your discharge medications or the care you received while you were in the hospital after you are discharged, you can call the unit and asked to speak with the hospitalist on call if the hospitalist that took care of you is not available. Once you are discharged, your primary care physician will handle any further medical issues. Please note that NO REFILLS for any discharge medications will be authorized once you are discharged, as it is imperative that you return to your primary care physician (or establish a relationship with a primary care physician if you do not have one) for your aftercare needs so that they can reassess your need for medications and monitor your lab values. If you do not have a primary care physician, you can call [phone] for a physician referral.   Increase activity slowly   Complete by:  As directed         Allergies as of 06/02/2018      Reactions   No Known Allergies    Other Other (See Comments)   "light" on pulse ox burns the skin. Prefer the "clamp"      Medication List    STOP taking these medications   butalbital-acetaminophen-caffeine 50-325-40 MG tablet Commonly known as:  FIORICET, ESGIC     TAKE these medications   aspirin EC 81 MG tablet Take 81 mg by mouth daily.   aspirin-acetaminophen-caffeine 250-250-65 MG tablet Commonly known as:  EXCEDRIN MIGRAINE Take 1 tablet by mouth every 6 (six) hours as needed for headache.   benzonatate 100 MG capsule Commonly known as:  TESSALON Take 1 capsule (100 mg total) by mouth 3 (three) times daily as needed for cough.   buPROPion 300 MG 24 hr tablet Commonly known as:  WELLBUTRIN XL Take 300 mg by  mouth every morning.   clopidogrel 75 MG  tablet Commonly known as:  PLAVIX Take 1 tablet (75 mg total) by mouth daily. Ok to resume tomorrow Start taking on:  June 03, 2018 What changed:  additional instructions   emtricitabine-tenofovir AF 200-25 MG tablet Commonly known as:  DESCOVY Take 1 tablet by mouth daily.   furosemide 40 MG tablet Commonly known as:  LASIX Take 40 mg by mouth daily as needed for fluid.   JARDIANCE 25 MG Tabs tablet Generic drug:  empagliflozin Take 25 mg by mouth daily.   lisinopril 10 MG tablet Commonly known as:  PRINIVIL,ZESTRIL Take 1 tablet (10 mg total) by mouth daily.   metFORMIN 1000 MG tablet Commonly known as:  GLUCOPHAGE Take 1,000 mg by mouth 2 (two) times daily with a meal. Notes to patient:  06/02/18   metoprolol tartrate 25 MG tablet Commonly known as:  LOPRESSOR TAKE 1 TABLET BY MOUTH TWICE A DAY What changed:    how much to take  additional instructions   ondansetron 4 MG disintegrating tablet Commonly known as:  ZOFRAN-ODT Take 1 tablet (4 mg total) by mouth every 8 (eight) hours as needed (for nausea not relived by phenergan).   oxyCODONE 5 MG immediate release tablet Commonly known as:  Oxy IR/ROXICODONE Take 1-2 tablets (5-10 mg total) by mouth every 4 (four) hours as needed (cancer associated pain).   polyethylene glycol packet Commonly known as:  MIRALAX / GLYCOLAX Take 17 g by mouth daily.   pravastatin 40 MG tablet Commonly known as:  PRAVACHOL Take 1 tablet (40 mg total) by mouth every evening.   promethazine 25 MG tablet Commonly known as:  PHENERGAN Take 1 tablet (25 mg total) by mouth every 8 (eight) hours as needed for nausea. What changed:  when to take this   senna 8.6 MG Tabs tablet Commonly known as:  SENOKOT Take 1 tablet (8.6 mg total) by mouth daily.   sertraline 100 MG tablet Commonly known as:  ZOLOFT Take 100 mg by mouth every morning. What changed:  Another medication with the same  name was removed. Continue taking this medication, and follow the directions you see here.   sodium chloride 1 g tablet Take 2 tablets (2 g total) by mouth 3 (three) times daily with meals.   TIVICAY 50 MG tablet Generic drug:  dolutegravir Take 50 mg by mouth daily.   zolpidem 10 MG tablet Commonly known as:  AMBIEN Take 5-10 mg by mouth at bedtime as needed for sleep.        Follow-up Information    Heath Lark, MD Follow up on 06/05/2018.   Specialty:  Hematology and Oncology Why:  at 2pm on 06/05/2018 Contact information: [place] [id number] [place]: 35 minutes  Bonnielee Haff  [facility] Pager on [email or web address]  06/02/2018, 1:14 PM

## 2018-06-02 NOTE — Progress Notes (Deleted)
[facility] Therapy Treatment Record Phone [phone]   Radiation Therapy was administered to [name] on: 06/02/2018  7:56 AM and was treatment # 5 out of a planned course of 10 treatments.  Radiation Treatment  1). Beam photons with 6-10 energy and Photons 10-19 MeV  2). Brachytherapy None  3). Stereotactic Radiosurgery None  4). Other Radiation None     Elayne Gruver, Denasia Venn, RT (T)

## 2018-06-03 ENCOUNTER — Ambulatory Visit
Admission: RE | Admit: 2018-06-03 | Discharge: 2018-06-03 | Disposition: A | Discharge status: Home / Self Care | Payer: 59 | Source: Ambulatory Visit | Attending: Radiation Oncology | Admitting: Radiation Oncology

## 2018-06-03 ENCOUNTER — Telehealth: Payer: Self-pay | Admitting: *Deleted

## 2018-06-03 DIAGNOSIS — E042 Nontoxic multinodular goiter: Secondary | ICD-10-CM | POA: Diagnosis not present

## 2018-06-03 DIAGNOSIS — C787 Secondary malignant neoplasm of liver and intrahepatic bile duct: Secondary | ICD-10-CM | POA: Diagnosis not present

## 2018-06-03 DIAGNOSIS — G893 Neoplasm related pain (acute) (chronic): Secondary | ICD-10-CM | POA: Diagnosis not present

## 2018-06-03 DIAGNOSIS — Z7902 Long term (current) use of antithrombotics/antiplatelets: Secondary | ICD-10-CM | POA: Diagnosis not present

## 2018-06-03 DIAGNOSIS — Z923 Personal history of irradiation: Secondary | ICD-10-CM | POA: Diagnosis not present

## 2018-06-03 DIAGNOSIS — Z7984 Long term (current) use of oral hypoglycemic drugs: Secondary | ICD-10-CM | POA: Diagnosis not present

## 2018-06-03 DIAGNOSIS — I700 Atherosclerosis of aorta: Secondary | ICD-10-CM | POA: Diagnosis not present

## 2018-06-03 DIAGNOSIS — C3430 Malignant neoplasm of lower lobe, unspecified bronchus or lung: Secondary | ICD-10-CM | POA: Diagnosis present

## 2018-06-03 DIAGNOSIS — B20 Human immunodeficiency virus [HIV] disease: Secondary | ICD-10-CM | POA: Diagnosis not present

## 2018-06-03 DIAGNOSIS — Z7982 Long term (current) use of aspirin: Secondary | ICD-10-CM | POA: Diagnosis not present

## 2018-06-03 DIAGNOSIS — Z79899 Other long term (current) drug therapy: Secondary | ICD-10-CM | POA: Diagnosis not present

## 2018-06-03 DIAGNOSIS — Z9221 Personal history of antineoplastic chemotherapy: Secondary | ICD-10-CM | POA: Diagnosis not present

## 2018-06-03 DIAGNOSIS — R634 Abnormal weight loss: Secondary | ICD-10-CM | POA: Diagnosis not present

## 2018-06-03 DIAGNOSIS — Z951 Presence of aortocoronary bypass graft: Secondary | ICD-10-CM | POA: Diagnosis not present

## 2018-06-03 NOTE — Telephone Encounter (Signed)
On 06-03-18 fax medical records to [employer] it was consult note , sim and planning note

## 2018-06-04 ENCOUNTER — Ambulatory Visit
Admission: RE | Admit: 2018-06-04 | Discharge: 2018-06-04 | Disposition: A | Discharge status: Home / Self Care | Payer: 59 | Source: Ambulatory Visit | Attending: Radiation Oncology | Admitting: Radiation Oncology

## 2018-06-04 DIAGNOSIS — C3430 Malignant neoplasm of lower lobe, unspecified bronchus or lung: Secondary | ICD-10-CM | POA: Diagnosis not present

## 2018-06-05 ENCOUNTER — Ambulatory Visit
Admission: RE | Admit: 2018-06-05 | Discharge: 2018-06-05 | Disposition: A | Discharge status: Home / Self Care | Payer: 59 | Source: Ambulatory Visit | Attending: Radiation Oncology | Admitting: Radiation Oncology

## 2018-06-05 ENCOUNTER — Inpatient Hospital Stay: Payer: 59 | Attending: Hematology and Oncology | Admitting: Hematology and Oncology

## 2018-06-05 ENCOUNTER — Encounter: Payer: Self-pay | Admitting: Hematology and Oncology

## 2018-06-05 DIAGNOSIS — Z79899 Other long term (current) drug therapy: Secondary | ICD-10-CM | POA: Insufficient documentation

## 2018-06-05 DIAGNOSIS — C3490 Malignant neoplasm of unspecified part of unspecified bronchus or lung: Secondary | ICD-10-CM

## 2018-06-05 DIAGNOSIS — G939 Disorder of brain, unspecified: Secondary | ICD-10-CM

## 2018-06-05 DIAGNOSIS — Z7984 Long term (current) use of oral hypoglycemic drugs: Secondary | ICD-10-CM | POA: Insufficient documentation

## 2018-06-05 DIAGNOSIS — Z7189 Other specified counseling: Secondary | ICD-10-CM

## 2018-06-05 DIAGNOSIS — Z9221 Personal history of antineoplastic chemotherapy: Secondary | ICD-10-CM | POA: Insufficient documentation

## 2018-06-05 DIAGNOSIS — Z21 Asymptomatic human immunodeficiency virus [HIV] infection status: Secondary | ICD-10-CM

## 2018-06-05 DIAGNOSIS — I700 Atherosclerosis of aorta: Secondary | ICD-10-CM | POA: Insufficient documentation

## 2018-06-05 DIAGNOSIS — R634 Abnormal weight loss: Secondary | ICD-10-CM

## 2018-06-05 DIAGNOSIS — Z7902 Long term (current) use of antithrombotics/antiplatelets: Secondary | ICD-10-CM | POA: Insufficient documentation

## 2018-06-05 DIAGNOSIS — Z7982 Long term (current) use of aspirin: Secondary | ICD-10-CM | POA: Insufficient documentation

## 2018-06-05 DIAGNOSIS — Z923 Personal history of irradiation: Secondary | ICD-10-CM | POA: Insufficient documentation

## 2018-06-05 DIAGNOSIS — Z951 Presence of aortocoronary bypass graft: Secondary | ICD-10-CM | POA: Insufficient documentation

## 2018-06-05 DIAGNOSIS — G893 Neoplasm related pain (acute) (chronic): Secondary | ICD-10-CM | POA: Insufficient documentation

## 2018-06-05 DIAGNOSIS — E042 Nontoxic multinodular goiter: Secondary | ICD-10-CM | POA: Insufficient documentation

## 2018-06-05 DIAGNOSIS — C3430 Malignant neoplasm of lower lobe, unspecified bronchus or lung: Secondary | ICD-10-CM | POA: Diagnosis not present

## 2018-06-05 DIAGNOSIS — C787 Secondary malignant neoplasm of liver and intrahepatic bile duct: Secondary | ICD-10-CM | POA: Insufficient documentation

## 2018-06-05 DIAGNOSIS — B20 Human immunodeficiency virus [HIV] disease: Secondary | ICD-10-CM

## 2018-06-05 NOTE — Assessment & Plan Note (Addendum)
We reviewed the final pathology. The patient has extensive stage small cell lung cancer. He will finish palliative radiation therapy to his chest next week I gave him a copy of the pathology report and NCCN guidelines related to treatment options Due to his young age and relatively preserved organ function, I would recommend carboplatin, etoposide and Atezolizumab or Durvalumab Typically, we would recommend minimum 3 cycles of treatment before repeat imaging study He has copy of imaging study made He would likely relocate to be closer to his sister for future treatment I recommend consideration for port placement before treatment I have not made a return appointment for the patient

## 2018-06-05 NOTE — Assessment & Plan Note (Signed)
He has mild progressive weight loss likely due to reduced appetite during treatment I recommend consideration to discontinue his diuretic and lisinopril

## 2018-06-05 NOTE — Assessment & Plan Note (Signed)
He will continue antiretroviral treatment

## 2018-06-05 NOTE — Assessment & Plan Note (Signed)
I have discussed this with neuro oncologist.  He recommend close observation and repeat imaging study in 3 months

## 2018-06-05 NOTE — Assessment & Plan Note (Signed)
He has mild intermittent pain.  He will take pain medicine as needed

## 2018-06-05 NOTE — Progress Notes (Signed)
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 The patient has extensive stage small cell lung cancer. He will finish palliative radiation therapy to his chest next week I gave him a copy of the pathology report and NCCN guidelines related to treatment options Due to his young age and relatively preserved organ function, I would recommend carboplatin, etoposide and Atezolizumab or Durvalumab Typically, we would recommend minimum 3 cycles of treatment before repeat imaging study He has copy of imaging study made He would likely relocate to be closer to his sister for future treatment I recommend consideration for port placement before treatment I have not made a return appointment for the patient  Brain lesion I have discussed this with neuro oncologist.  He recommend close observation and repeat imaging study in 3 months  Metastasis to liver [facility]) He has mild intermittent pain.  He will take pain medicine as needed  Weight loss He has mild progressive weight loss likely due to reduced appetite during treatment I recommend consideration to discontinue his diuretic and lisinopril  HIV infection ([place]) He will continue antiretroviral treatment  Goals of care, counseling/discussion We discussed goals of care is strictly palliative Without treatment, he would likely succumb to the disease in less than 6 months He has reasonable performance status and would like to try some treatment but will likely relocate to be closer to his sister We discussed overall prognosis.   If he responds to treatment, in general, prognosis would be slightly under 2 years or longer if he tolerates treatment well.   He has significant other medical comorbidities  that could limit treatment options.   No orders of the defined types were placed in this encounter.   INTERVAL HISTORY: Please see below for problem oriented charting. He returns with his sister and other family members for further follow-up He is tolerating radiation well He has occasional nausea but responded well to antiemetics Has mild intermittent right flank pain.  His pain is well controlled. He has lost some weight due to reduced appetite Denies constipation He has occasional cough.  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 Innumerable ill-defined low-density liver lesions consistent with metastatic disease. 2. Bulky lymphadenopathy in the hepato duodenal ligament with para-aortic lymphadenopathy in the retroperitoneal space of the abdomen, findings consistent with metastatic involvement. 3. Incompletely visualized but markedly enlarged subcarinal lymph node is associated with apparent bulky left hilar lymphadenopathy and airspace disease in the left lower lobe. Dedicated CT chest with contrast material recommended to further evaluate. 4. Bilateral renal cysts. 5.  Aortic Atherosclerois (ICD10-170.0)    05/25/2018 Imaging    1. Advanced Thoracic Malignancy with combined tumor and atelectatic lung about the left hilum tracking into the left lower lobe. Mass effect on the left hilar airways. Bulky bilateral mediastinal lymphadenopathy, continuing to the thoracic inlet. 2. Liver metastases better demonstrated on the abdomen CT yesterday. No skeletal metastasis identified in the chest by CT. 3. Chronic thyroid goiter appears stable since 2018 and benign.    05/25/2018 Imaging    MRI Brain with contrast 1. Several calvarial lesions, the largest lesion in the left parietal bone. Findings are suspicious for metastatic disease. 2. Nonspecific  diffuse thickening and enhancement of infundibulum and hypothalamus.  Differential includes sarcoidosis, histiocytosis, lymphocytic hypophysitis, infectious process such as syphilis, pseudotumor, lymphoproliferative disease, or metastasis. 3. Mild chronic microvascular ischemic changes and volume loss of the brain.     06/01/2018 Pathology Results    Liver, needle/core biopsy - SMALL CELL CARCINOMA. - SEE COMMENT.    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 ([place])    06/05/2018 Cancer Staging    Staging form: Lung, AJCC 8th Edition - Clinical: Stage IV (cT4, cN3, cM1) - Signed by Heath Lark, MD on 06/05/2018     REVIEW OF SYSTEMS:   Constitutional: Denies fevers, chills  Eyes: Denies blurriness of vision Ears, nose, mouth, throat, and face: Denies mucositis or sore throat Cardiovascular: Denies palpitation, chest discomfort or lower extremity swelling Skin: Denies abnormal skin rashes Lymphatics: Denies new lymphadenopathy or easy bruising Neurological:Denies numbness, tingling or new weaknesses Behavioral/Psych: Mood is stable, no new changes  All other systems were reviewed with the patient and are negative.  I have reviewed the past medical history, past surgical history, social history and family history with the patient and they are unchanged from previous note.  ALLERGIES:  is allergic to no known allergies and other.  MEDICATIONS:  Current Outpatient Medications  Medication Sig Dispense Refill  . aspirin EC 81 MG tablet Take 81 mg by mouth daily.    Marland Kitchen aspirin-acetaminophen-caffeine (EXCEDRIN MIGRAINE) 250-250-65 MG tablet Take 1 tablet by mouth every 6 (six) hours as needed for headache.    . benzonatate (TESSALON) 100 MG capsule Take 1 capsule (100 mg total) by mouth 3 (three) times daily as needed for cough. 30 capsule 0  . buPROPion (WELLBUTRIN XL) 300 MG 24 hr tablet Take 300 mg by mouth every morning.    . clopidogrel (PLAVIX) 75 MG tablet Take 1 tablet (75 mg total) by mouth daily. Ok to  resume tomorrow 90 tablet 3  . dolutegravir (TIVICAY) 50 MG tablet Take 50 mg by mouth daily.    . empagliflozin (JARDIANCE) 25 MG TABS tablet Take 25 mg by mouth daily.    Marland Kitchen emtricitabine-tenofovir AF (DESCOVY) 200-25 MG tablet Take 1 tablet by mouth daily.    . furosemide (LASIX) 40 MG tablet Take 40 mg by mouth daily as needed for fluid.     Marland Kitchen lisinopril (PRINIVIL,ZESTRIL) 10 MG tablet Take 1 tablet (10 mg total) by mouth daily. 90 tablet 3  . metFORMIN (GLUCOPHAGE) 1000 MG tablet Take 1,000 mg by mouth 2 (two) times daily with a meal.    . metoprolol tartrate (LOPRESSOR) 25 MG tablet TAKE 1 TABLET BY MOUTH TWICE A DAY (Patient taking differently: Take 50 mg by mouth 2 (two) times daily. TAKE 1 TABLET BY MOUTH TWICE A DAY) 180 tablet 3  . ondansetron (ZOFRAN-ODT) 4 MG disintegrating tablet Take 1 tablet (4 mg total) by mouth every 8 (eight) hours as needed (for nausea not relived by phenergan). 20 tablet 0  . oxyCODONE (OXY IR/ROXICODONE) 5 MG immediate release tablet Take 1-2 tablets (5-10 mg total) by mouth every 4 (four) hours as needed (cancer associated pain). 30 tablet 0  . polyethylene glycol (MIRALAX / GLYCOLAX) packet Take 17 g by mouth daily. 30 each 0  . pravastatin (PRAVACHOL) 40 MG tablet Take 1 tablet (40 mg total) by mouth every evening. 90 tablet 3  . promethazine (PHENERGAN) 25 MG tablet Take 1 tablet (25 mg total) by mouth every 8 (eight) hours as  needed for nausea. 30 tablet 0  . senna (SENOKOT) 8.6 MG TABS tablet Take 1 tablet (8.6 mg total) by mouth daily. 30 each 0  . sertraline (ZOLOFT) 100 MG tablet Take 100 mg by mouth every morning.    . sodium chloride 1 g tablet Take 2 tablets (2 g total) by mouth 3 (three) times daily with meals. 90 tablet 1  . zolpidem (AMBIEN) 10 MG tablet Take 5-10 mg by mouth at bedtime as needed for sleep.   3   No current facility-administered medications for this visit.     PHYSICAL EXAMINATION: ECOG PERFORMANCE STATUS: 1 - Symptomatic but  completely ambulatory  Vitals:   06/05/18 1405  BP: 125/81  Pulse: 86  Resp: (!) 24  Temp: 97.6 F (36.4 C)  SpO2: 96%   Filed Weights   06/05/18 1405  Weight: 268 lb 12.8 oz (121.9 kg)    GENERAL:alert, no distress and comfortable SKIN: skin color, texture, turgor are normal, no rashes or significant lesions EYES: normal, Conjunctiva are pink and non-injected, sclera clear OROPHARYNX:no exudate, no erythema and lips, buccal mucosa, and tongue normal  NECK: supple, thyroid normal size, non-tender, without nodularity LYMPH:  no palpable lymphadenopathy in the cervical, axillary or inguinal LUNGS: clear to auscultation and percussion with normal breathing effort HEART: regular rate & rhythm and no murmurs and no lower extremity edema ABDOMEN:abdomen soft, mild tenderness on palpation over the right upper quadrant without rebound or guarding Musculoskeletal:no cyanosis of digits and no clubbing  NEURO: alert & oriented x 3 with fluent speech, no focal motor/sensory deficits  LABORATORY DATA:  I have reviewed the data as listed    Component Value Date/Time   NA 132 (L) 06/02/2018 0422   NA 137 10/02/2016 1023   K 3.9 06/02/2018 0422   CL 100 06/02/2018 0422   CO2 20 (L) 06/02/2018 0422   GLUCOSE 138 (H) 06/02/2018 0422   BUN 14 06/02/2018 0422   BUN 15 10/02/2016 1023   CREATININE 0.78 06/02/2018 0422   CALCIUM 8.6 (L) 06/02/2018 0422   PROT 7.2 06/01/2018 0633   ALBUMIN 3.4 (L) 06/01/2018 0633   AST 112 (H) 06/01/2018 0633   ALT 88 (H) 06/01/2018 0633   ALKPHOS 468 (H) 06/01/2018 0633   BILITOT 0.9 06/01/2018 0633   GFRNONAA >60 06/02/2018 0422   GFRAA >60 06/02/2018 0422    No results found for: SPEP, UPEP  Lab Results  Component Value Date   WBC 7.9 06/02/2018   NEUTROABS 3.6 06/01/2018   HGB 14.3 06/02/2018   HCT 44.7 06/02/2018   MCV 84.5 06/02/2018   PLT 284 06/02/2018      Chemistry      Component Value Date/Time   NA 132 (L) 06/02/2018 0422   NA  137 10/02/2016 1023   K 3.9 06/02/2018 0422   CL 100 06/02/2018 0422   CO2 20 (L) 06/02/2018 0422   BUN 14 06/02/2018 0422   BUN 15 10/02/2016 1023   CREATININE 0.78 06/02/2018 0422      Component Value Date/Time   CALCIUM 8.6 (L) 06/02/2018 0422   ALKPHOS 468 (H) 06/01/2018 0633   AST 112 (H) 06/01/2018 0633   ALT 88 (H) 06/01/2018 0633   BILITOT 0.9 06/01/2018 7672       RADIOGRAPHIC STUDIES: I have personally reviewed the radiological images as listed and agreed with the findings in the report. Dg Chest 2 View  Result Date: 05/24/2018 CLINICAL DATA:  Nausea and vomiting for 2 weeks.  Shortness of breath and fever for 1 week. EXAM: CHEST - 2 VIEW COMPARISON:  PA and lateral chest 10/08/2017, 02/18/2011 and 09/02/2016. CT chest 09/02/2016. FINDINGS: There is peribronchial thickening. Increased density projects over the left heart on the frontal view. No pneumothorax pleural effusion. Heart size is normal. The patient is status post CABG. Aortic atherosclerosis is noted. IMPRESSION: Increased density over the left heart is worrisome for lingular pneumonia. Recommend repeat PA and lateral films in 4-6 weeks to ensure resolution. Bronchitic change. Electronically Signed   By: Inge Rise M.D.   On: 05/24/2018 16:21   Ct Head Wo Contrast  Result Date: 05/24/2018 CLINICAL DATA:  Headache and nausea. EXAM: CT HEAD WITHOUT CONTRAST TECHNIQUE: Contiguous axial images were obtained from the base of the skull through the vertex without intravenous contrast. COMPARISON:  05/09/2018 FINDINGS: Brain: there is no evidence for acute hemorrhage, hydrocephalus, mass lesion, or abnormal extra-axial fluid collection. No definite CT evidence for acute infarction. Vascular: No hyperdense vessel or unexpected calcification. Skull: Normal. Negative for fracture or focal lesion. Sinuses/Orbits: No acute finding. Other: None. IMPRESSION: No acute intracranial abnormality. Electronically Signed   By: Misty Stanley M.D.   On: 05/24/2018 17:50   Ct Head Wo Contrast  Result Date: 05/09/2018 CLINICAL DATA:  Headache with onset of nausea and vomiting today. EXAM: CT HEAD WITHOUT CONTRAST TECHNIQUE: Contiguous axial images were obtained from the base of the skull through the vertex without intravenous contrast. COMPARISON:  10/01/2015 FINDINGS: Brain: Mild low density in the periventricular white matter likely related to small vessel disease. No mass lesion, hemorrhage, hydrocephalus, acute infarct, intra-axial, or extra-axial fluid collection. Vascular: No hyperdense vessel or unexpected calcification. Skull: Normal Sinuses/Orbits: Normal imaged portions of the orbits and globes. Mucosal thickening of right ethmoid air cells is mild. Clear mastoid air cells. Other: None. IMPRESSION: No acute intracranial abnormality. Electronically Signed   By: Abigail Miyamoto M.D.   On: 05/09/2018 21:08   Ct Chest W Contrast  Result Date: 05/25/2018 CLINICAL DATA:  53 year old male with shortness of breath, fever, cough nausea and vomiting. History of HIV. Numerous liver lesion suspected to be metastases on CT Abdomen and Pelvis yesterday with abnormal left lung base. EXAM: CT CHEST WITH CONTRAST TECHNIQUE: Multidetector CT imaging of the chest was performed during intravenous contrast administration. CONTRAST:  61mL OMNIPAQUE IOHEXOL 300 MG/ML  SOLN COMPARISON:  05/24/2018 chest radiographs.  Chest CTA 09/02/2016. FINDINGS: Cardiovascular: CABG since the 2018 CT. Mild cardiomegaly. No pericardial effusion. Mediastinum/Nodes: Extensive mediastinal lymphadenopathy, bulky with individual malignant appearing nodal metastases up to 3-3.5 centimeters short axis. Adenopathy continues to the thoracic inlet where abnormal level 4 nodes measure 12 millimeter short axis. Chronic thyromegaly and thyroid heterogeneity appears stable since 2018 and is likely benign. Unilateral left hilar lymphadenopathy which is inseparable from abnormal left  lower lobe tumor/opacity. Lungs/Pleura: Heterogeneously enhancing lung and soft tissue about the left hilum with in case mint and compression of the branching of the left mainstem bronchus (series 4, image 68). Relatively few air bronchograms, mostly tracking inferiorly into the left lower lobe where as the opacity about the right hilum seems more compatible with confluent lung tumor. Superimposed trace layering left pleural effusion and/or irregular pleural thickening. Left lung pulmonary septal thickening which might indicate tumor related edema or lymphangitic carcinomatosis. No right lung nodule or mass.  No right pleural effusion. Upper Abdomen: Stable. Hepatomegaly. Numerous hypodense liver lesions were better demonstrated on the abdomen CT yesterday. Musculoskeletal: Prior sternotomy. No acute or  suspicious bone lesion identified in the chest. IMPRESSION: 1. Advanced Thoracic Malignancy with combined tumor and atelectatic lung about the left hilum tracking into the left lower lobe. Mass effect on the left hilar airways. Bulky bilateral mediastinal lymphadenopathy, continuing to the thoracic inlet. 2. Liver metastases better demonstrated on the abdomen CT yesterday. No skeletal metastasis identified in the chest by CT. 3. Chronic thyroid goiter appears stable since 2018 and benign. Electronically Signed   By: Genevie Ann M.D.   On: 05/25/2018 23:27   Mr Jeri Cos AV Contrast  Result Date: 05/25/2018 CLINICAL DATA:  53 y/o M; HIV, liver lesions, pneumonia, fever, headache, nausea, vomiting, abdominal pain. EXAM: MRI HEAD WITHOUT AND WITH CONTRAST TECHNIQUE: Multiplanar, multiecho pulse sequences of the brain and surrounding structures were obtained without and with intravenous contrast. CONTRAST:  10 cc Gadavist COMPARISON:  05/24/2018 CT head FINDINGS: Brain: Thickening and enhancement of the infundibulum and hypothalamus measuring up to 7 mm (series 18, image 25-27 and series 9, image 12). No additional focus of  intracranial abnormal enhancement. No reduced diffusion to suggest acute or early subacute infarction. No abnormal susceptibility hypointensity to indicate intracranial hemorrhage. No extra-axial collection, hydrocephalus, mass effect, or herniation. Several punctate nonspecific T2 FLAIR hyperintensities in subcortical and periventricular white matter are compatible with mild chronic microvascular ischemic changes. Mild volume loss of the brain. Vascular: Normal flow voids. Skull and upper cervical spine: There several T1 and T2 hypointense lesions within the calvarium demonstrate diffusion hyperintensity and there is appreciable enhancement of the largest lesion in left parietal bone (series 5, image 104 and 105 as well as series 18, image 53). Sinuses/Orbits: Negative. Other: None. IMPRESSION: 1. Several calvarial lesions, the largest lesion in the left parietal bone. Findings are suspicious for metastatic disease. 2. Nonspecific diffuse thickening and enhancement of infundibulum and hypothalamus. Differential includes sarcoidosis, histiocytosis, lymphocytic hypophysitis, infectious process such as syphilis, pseudotumor, lymphoproliferative disease, or metastasis. 3. Mild chronic microvascular ischemic changes and volume loss of the brain. Electronically Signed   By: Kristine Garbe M.D.   On: 05/25/2018 20:01   Ct Abdomen Pelvis W Contrast  Result Date: 05/24/2018 CLINICAL DATA:  Nausea vomiting and abdominal pain EXAM: CT ABDOMEN AND PELVIS WITH CONTRAST TECHNIQUE: Multidetector CT imaging of the abdomen and pelvis was performed using the standard protocol following bolus administration of intravenous contrast. CONTRAST:  133mL ISOVUE-300 IOPAMIDOL (ISOVUE-300) INJECTION 61% COMPARISON:  06/29/2014 FINDINGS: Lower chest: 3.7 cm short axis subcarinal lymph node is associated with bulky lymphadenopathy in the left hilum and nodular airspace opacity left lower lobe measuring up to 3.8 cm. Tiny left  pleural effusion associated. Hepatobiliary: Liver shows innumerable ill-defined low-density lesions consistent with metastatic disease. Dominant lesion in the inferior right liver measures 5.6 cm (49/2). Index lesion posterior right liver measures 3.6 cm on 30 /2. There is no evidence for gallstones, gallbladder wall thickening, or pericholecystic fluid. No intrahepatic or extrahepatic biliary dilation. Pancreas: No focal mass lesion. No dilatation of the main duct. No intraparenchymal cyst. No peripancreatic edema. Spleen: No splenomegaly. No focal mass lesion. Adrenals/Urinary Tract: No adrenal nodule or mass. 15 mm exophytic low-density lesion lower pole right kidney is probably a cyst. 3.7 cm lesion interpolar left kidney approaches water attenuation, also likely a cyst. No evidence for hydroureter. The urinary bladder appears normal for the degree of distention. Stomach/Bowel: Stomach is unremarkable. No gastric wall thickening. No evidence of outlet obstruction. Duodenum is normally positioned as is the ligament of Treitz. No small bowel wall  thickening. No small bowel dilatation. The terminal ileum is normal. The appendix is normal. No gross colonic mass. No colonic wall thickening. Vascular/Lymphatic: There is abdominal aortic atherosclerosis without aneurysm. Bulky lymphadenopathy is seen in the hepato duodenal ligament. Index 2.9 cm short axis lymph node identified on 39/2. 2.5 cm short axis pre caval lymph node visible on 42/2. Para-aortic lymphadenopathy is associated with 1.4 cm short axis aortocaval node visible on 50/2. No pelvic sidewall lymphadenopathy. Reproductive: The prostate gland and seminal vesicles are unremarkable. Other: No intraperitoneal free fluid. Musculoskeletal: Sclerotic lesion in the left femoral neck is stable since prior. No worrisome lytic or sclerotic osseous abnormality. IMPRESSION: 1. Innumerable ill-defined low-density liver lesions consistent with metastatic disease. 2.  Bulky lymphadenopathy in the hepato duodenal ligament with para-aortic lymphadenopathy in the retroperitoneal space of the abdomen, findings consistent with metastatic involvement. 3. Incompletely visualized but markedly enlarged subcarinal lymph node is associated with apparent bulky left hilar lymphadenopathy and airspace disease in the left lower lobe. Dedicated CT chest with contrast material recommended to further evaluate. 4. Bilateral renal cysts. 5.  Aortic Atherosclerois (ICD10-170.0) Electronically Signed   By: Misty Stanley M.D.   On: 05/24/2018 17:46   Ir US Guide Bx Asp/drain  Result Date: 06/01/2018 INDICATION: Concern for metastatic lung cancer. Please perform ultrasound-guided liver lesion biopsy for tissue diagnostic purposes. EXAM: ULTRASOUND GUIDED LIVER LESION BIOPSY COMPARISON:  None. MEDICATIONS: None ANESTHESIA/SEDATION: Fentanyl 100 mcg IV; Versed 2 mg IV Total Moderate Sedation time:  13 Minutes. The patient's level of consciousness and vital signs were monitored continuously by radiology nursing throughout the procedure under my direct supervision. COMPLICATIONS: None immediate. PROCEDURE: Informed written consent was obtained from the patient after a discussion of the risks, benefits and alternatives to treatment. The patient understands and consents the procedure. A timeout was performed prior to the initiation of the procedure. Ultrasound scanning was performed of the right upper abdominal quadrant demonstrates multiple hypoechoic liver lesions and masses. Dominant approximately 3.0 x 2.1 cm hypoechoic lesion within the subcapsular aspect the right lobe of the liver was targeted for biopsy given lesion location and sonographic window. The procedure was planned. The right upper abdominal quadrant was prepped and draped in the usual sterile fashion. The overlying soft tissues were anesthetized with 1% lidocaine with epinephrine. A 17 gauge, 6.8 cm co-axial needle was advanced into a  peripheral aspect of the lesion. This was followed by 5 core biopsies with an 18 gauge core device under direct ultrasound guidance. The coaxial needle tract was embolized with a small amount of Gel-Foam slurry and superficial hemostasis was obtained with manual compression. Post procedural scanning was negative for definitive area of hemorrhage or additional complication. A dressing was placed. The patient tolerated the procedure well without immediate post procedural complication. IMPRESSION: Technically successful ultrasound guided core needle biopsy of indeterminate lesion within the subcapsular aspect the right lobe of the liver. Electronically Signed   By: Sandi Mariscal M.D.   On: 06/01/2018 12:10    All questions were answered. The patient knows to call the clinic with any problems, questions or concerns. No barriers to learning was detected.  I spent 30 minutes counseling the patient face to face. The total time spent in the appointment was 40 minutes and more than 50% was on counseling and review of test results  Heath Lark, MD 06/05/2018 3:17 PM

## 2018-06-05 NOTE — Assessment & Plan Note (Signed)
We discussed goals of care is strictly palliative Without treatment, he would likely succumb to the disease in less than 6 months He has reasonable performance status and would like to try some treatment but will likely relocate to be closer to his sister We discussed overall prognosis.   If he responds to treatment, in general, prognosis would be slightly under 2 years or longer if he tolerates treatment well.   He has significant other medical comorbidities that could limit treatment options.

## 2018-06-08 ENCOUNTER — Other Ambulatory Visit: Payer: Self-pay | Admitting: Hematology and Oncology

## 2018-06-08 ENCOUNTER — Telehealth: Payer: Self-pay | Admitting: Hematology and Oncology

## 2018-06-08 ENCOUNTER — Ambulatory Visit
Admission: RE | Admit: 2018-06-08 | Discharge: 2018-06-08 | Disposition: A | Discharge status: Home / Self Care | Payer: 59 | Source: Ambulatory Visit | Attending: Radiation Oncology | Admitting: Radiation Oncology

## 2018-06-08 DIAGNOSIS — C3430 Malignant neoplasm of lower lobe, unspecified bronchus or lung: Secondary | ICD-10-CM | POA: Diagnosis not present

## 2018-06-08 DIAGNOSIS — C3490 Malignant neoplasm of unspecified part of unspecified bronchus or lung: Secondary | ICD-10-CM

## 2018-06-08 DIAGNOSIS — C787 Secondary malignant neoplasm of liver and intrahepatic bile duct: Secondary | ICD-10-CM

## 2018-06-08 NOTE — Progress Notes (Signed)
START ON PATHWAY REGIMEN - Small Cell Lung     Cycles 1 through 4, every 21 days:     Atezolizumab      Carboplatin      Etoposide    Cycles 5 and beyond, every 21 days:     Atezolizumab   **Always confirm dose/schedule in your pharmacy ordering system**  Patient Characteristics: Newly Diagnosed, Preoperative or Nonsurgical Candidate (Clinical Staging), First Line, Extensive Stage Therapeutic Status: Newly Diagnosed, Preoperative or Nonsurgical Candidate (Clinical Staging) AJCC T Category: cT4 AJCC N Category: cN3 AJCC M Category: pM1 AJCC 8 Stage Grouping: IV Stage Classification: Extensive Intent of Therapy: Non-Curative / Palliative Intent, Discussed with Patient

## 2018-06-08 NOTE — Telephone Encounter (Signed)
Per 2/17 sch message.   I scheduled lab and f.u for Wednesday . I did not schedule chemo eu - pt unable to come at the time slot that is available.   Pt says that he is going to [place] on Thursday  to see and oncologist there Friday to get further treatment - He didn't say he didn't want to get treatment here but he wanted to go to go to that appt first.   I left the treatment as is . But if I need to cancel please let me know.

## 2018-06-09 ENCOUNTER — Ambulatory Visit
Admission: RE | Admit: 2018-06-09 | Discharge: 2018-06-09 | Disposition: A | Discharge status: Home / Self Care | Payer: 59 | Source: Ambulatory Visit | Attending: Radiation Oncology | Admitting: Radiation Oncology

## 2018-06-09 DIAGNOSIS — C3430 Malignant neoplasm of lower lobe, unspecified bronchus or lung: Secondary | ICD-10-CM | POA: Diagnosis not present

## 2018-06-10 ENCOUNTER — Inpatient Hospital Stay (HOSPITAL_BASED_OUTPATIENT_CLINIC_OR_DEPARTMENT_OTHER): Payer: 59 | Admitting: Hematology and Oncology

## 2018-06-10 ENCOUNTER — Other Ambulatory Visit: Payer: Self-pay

## 2018-06-10 ENCOUNTER — Telehealth: Payer: Self-pay | Admitting: Hematology and Oncology

## 2018-06-10 ENCOUNTER — Inpatient Hospital Stay: Payer: 59

## 2018-06-10 DIAGNOSIS — Z7189 Other specified counseling: Secondary | ICD-10-CM

## 2018-06-10 DIAGNOSIS — C787 Secondary malignant neoplasm of liver and intrahepatic bile duct: Secondary | ICD-10-CM

## 2018-06-10 DIAGNOSIS — C3490 Malignant neoplasm of unspecified part of unspecified bronchus or lung: Secondary | ICD-10-CM | POA: Diagnosis not present

## 2018-06-10 DIAGNOSIS — G893 Neoplasm related pain (acute) (chronic): Secondary | ICD-10-CM

## 2018-06-10 DIAGNOSIS — B20 Human immunodeficiency virus [HIV] disease: Secondary | ICD-10-CM | POA: Diagnosis not present

## 2018-06-10 DIAGNOSIS — E042 Nontoxic multinodular goiter: Secondary | ICD-10-CM

## 2018-06-10 DIAGNOSIS — G939 Disorder of brain, unspecified: Secondary | ICD-10-CM

## 2018-06-10 DIAGNOSIS — C3430 Malignant neoplasm of lower lobe, unspecified bronchus or lung: Secondary | ICD-10-CM | POA: Diagnosis not present

## 2018-06-10 LAB — CMP (CANCER CENTER ONLY)
ALT: 193 U/L — ABNORMAL HIGH (ref 0–44)
AST: 190 U/L (ref 15–41)
Albumin: 3.2 g/dL — ABNORMAL LOW (ref 3.5–5.0)
Alkaline Phosphatase: 595 U/L — ABNORMAL HIGH (ref 38–126)
Anion gap: 10 (ref 5–15)
BUN: 14 mg/dL (ref 6–20)
CO2: 23 mmol/L (ref 22–32)
Calcium: 9.5 mg/dL (ref 8.9–10.3)
Chloride: 104 mmol/L (ref 98–111)
Creatinine: 0.95 mg/dL (ref 0.61–1.24)
GFR, Est AFR Am: >60 mL/min (ref >60)
Glucose, Bld: 151 mg/dL — ABNORMAL HIGH (ref 70–99)
Potassium: 3.8 mmol/L (ref 3.5–5.1)
Sodium: 137 mmol/L (ref 135–145)
Total Bilirubin: 1.7 mg/dL — ABNORMAL HIGH (ref 0.3–1.2)
Total Protein: 7.2 g/dL (ref 6.5–8.1)

## 2018-06-10 LAB — CBC WITH DIFFERENTIAL (CANCER CENTER ONLY)
Abs Immature Granulocytes: 0.06 10*3/uL (ref 0.00–0.07)
Basophils Absolute: 0.2 10*3/uL — ABNORMAL HIGH (ref 0.0–0.1)
Basophils Relative: 2 %
Eosinophils Absolute: 1.4 10*3/uL — ABNORMAL HIGH (ref 0.0–0.5)
Eosinophils Relative: 17 %
HCT: 46 % (ref 39.0–52.0)
Hemoglobin: 14.8 g/dL (ref 13.0–17.0)
Immature Granulocytes: 1 %
Lymphocytes Relative: 12 %
Lymphs Abs: 1 10*3/uL (ref 0.7–4.0)
MCH: 27.6 pg (ref 26.0–34.0)
MCHC: 32.2 g/dL (ref 30.0–36.0)
MCV: 85.8 fL (ref 80.0–100.0)
Monocytes Absolute: 0.8 10*3/uL (ref 0.1–1.0)
Monocytes Relative: 10 %
Neutro Abs: 4.8 10*3/uL (ref 1.7–7.7)
Neutrophils Relative %: 58 %
Platelet Count: 227 10*3/uL (ref 150–400)
RBC: 5.36 MIL/uL (ref 4.22–5.81)
RDW: 15.7 % — ABNORMAL HIGH (ref 11.5–15.5)
WBC: 8.2 10*3/uL (ref 4.0–10.5)
nRBC: 0 % (ref 0.0–0.2)

## 2018-06-10 LAB — TSH: TSH: <0.08 u[IU]/mL — ABNORMAL LOW (ref 0.320–4.118)

## 2018-06-10 MED ORDER — BENZONATATE 100 MG PO CAPS: 100.0000 mg | ORAL_CAPSULE | Freq: Three times a day (TID) | ORAL | 0 refills | Status: AC | PRN

## 2018-06-10 MED ORDER — ONDANSETRON HCL 8 MG PO TABS: 8.0000 mg | ORAL_TABLET | Freq: Three times a day (TID) | ORAL | 3 refills | Status: AC | PRN

## 2018-06-10 NOTE — Telephone Encounter (Signed)
No los °

## 2018-06-10 NOTE — Telephone Encounter (Signed)
Faxed medical records to [facility] at [phone] attn: Hilbert Corrigan, Release CV:[id number]

## 2018-06-11 ENCOUNTER — Telehealth: Payer: Self-pay

## 2018-06-11 ENCOUNTER — Encounter: Payer: Self-pay | Admitting: Hematology and Oncology

## 2018-06-11 ENCOUNTER — Other Ambulatory Visit (HOSPITAL_COMMUNITY): Payer: Self-pay | Admitting: General Practice

## 2018-06-11 ENCOUNTER — Ambulatory Visit: Payer: Commercial Managed Care - PPO | Attending: General Practice

## 2018-06-11 DIAGNOSIS — C229 Malignant neoplasm of liver, not specified as primary or secondary: Secondary | ICD-10-CM

## 2018-06-11 LAB — T4: T4, Total: 5.5 ug/dL (ref 4.5–12.0)

## 2018-06-11 NOTE — Progress Notes (Addendum)
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  Currently, he is not symptomatic.  He has just completed palliative radiation treatment and he denies significant cough or shortness of breath We discussed the risks, benefits, side effects of treatment with carboplatin, etoposide and Atezolizumab. Expected side effects included, though not limited to, pancytopenia, risk of infection, possible need for transfusion, hospitalization for various reason, renal dysfunction, allergic reaction and possibly death. The patient would like to proceed with treatment Due to the timing of his schedule, it is not possible to get him a port placed before treatment The patient is also contemplating relocating to another state to be closer to family members.  He will call me at the end of the week for final decision He still need to attend chemo education class before starting treatment and he is aware of that requirement.  Metastasis to liver [facility]) He has significant liver enzymes abnormalities due to metastasis of the liver If he chooses to be treated here, I recommend dose adjustment with reduced dose etoposide  HIV infection ([place]) He will continue antiretroviral treatment  Brain lesion I have discussed this with neuro oncologist.  He recommend close observation and repeat imaging study in 3 months  Multiple thyroid nodules He has abnormal thyroid nodule with thyromegaly TSH is profoundly suppressed but free thyroxine is normal. It could be related to sick euthyroid syndrome. I recommend recheck thyroid function or referral to endocrinologist in the near future  Goals of care,  counseling/discussion We discussed goals of care is strictly palliative   No orders of the defined types were placed in this encounter.   INTERVAL HISTORY: Please see below for problem oriented charting. He returns for further follow-up with his sister and friend Since last time I saw him, he feels fine.  He has less cough or shortness of breath He is contemplating to relocate to another state but would like to discuss side effects of chemotherapy today. There is a small chance he will like to get started on treatment next week here.  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 Innumerable ill-defined low-density liver lesions consistent with metastatic disease. 2. Bulky lymphadenopathy in the hepato duodenal ligament with para-aortic lymphadenopathy in the retroperitoneal space of the abdomen, findings consistent with metastatic involvement. 3. Incompletely visualized but markedly enlarged subcarinal lymph node is associated with apparent bulky left hilar lymphadenopathy and airspace disease in the left lower lobe. Dedicated CT chest with contrast material recommended to further evaluate. 4. Bilateral renal cysts. 5.  Aortic Atherosclerois (ICD10-170.0)    05/25/2018 Imaging    1. Advanced Thoracic Malignancy with combined tumor and atelectatic lung about the left hilum tracking into the left lower lobe. Mass effect on the left hilar airways. Bulky bilateral mediastinal lymphadenopathy, continuing to the thoracic inlet. 2. Liver metastases better demonstrated on the abdomen CT yesterday. No skeletal metastasis identified in the chest by CT. 3. Chronic thyroid goiter appears stable since 2018 and benign.    05/25/2018 Imaging    MRI Brain with contrast 1. Several calvarial lesions, the largest lesion in the left parietal bone. Findings  are suspicious for metastatic disease. 2. Nonspecific diffuse thickening and  enhancement of infundibulum and hypothalamus. Differential includes sarcoidosis, histiocytosis, lymphocytic hypophysitis, infectious process such as syphilis, pseudotumor, lymphoproliferative disease, or metastasis. 3. Mild chronic microvascular ischemic changes and volume loss of the brain.     06/01/2018 Pathology Results    Liver, needle/core biopsy - SMALL CELL CARCINOMA. - SEE COMMENT.    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 ([place])    06/05/2018 Cancer Staging    Staging form: Lung, AJCC 8th Edition - Clinical: Stage IV (cT4, cN3, cM1) - Signed by Heath Lark, MD on 06/05/2018    06/17/2018 -  Chemotherapy    The patient had palonosetron (ALOXI) injection 0.25 mg, 0.25 mg, Intravenous,  Once, 0 of 4 cycles pegfilgrastim-cbqv (UDENYCA) injection 6 mg, 6 mg, Subcutaneous, Once, 0 of 4 cycles CARBOplatin (PARAPLATIN) in sodium chloride 0.9 % 100 mL chemo infusion, , Intravenous,  Once, 0 of 4 cycles etoposide (VEPESID) 240 mg in sodium chloride 0.9 % 600 mL chemo infusion, 100 mg/m2, Intravenous,  Once, 0 of 4 cycles fosaprepitant (EMEND) 150 mg, dexamethasone (DECADRON) 12 mg in sodium chloride 0.9 % 145 mL IVPB, , Intravenous,  Once, 0 of 4 cycles atezolizumab (TECENTRIQ) 1,200 mg in sodium chloride 0.9 % 250 mL chemo infusion, 1,200 mg, Intravenous, Once, 0 of 8 cycles  for chemotherapy treatment.      Metastasis to liver ([place])   06/05/2018 Initial Diagnosis    Metastasis to liver ([place])    06/17/2018 -  Chemotherapy    The patient had palonosetron (ALOXI) injection 0.25 mg, 0.25 mg, Intravenous,  Once, 0 of 4 cycles pegfilgrastim-cbqv (UDENYCA) injection 6 mg, 6 mg, Subcutaneous, Once, 0 of 4 cycles CARBOplatin (PARAPLATIN) in sodium chloride 0.9 % 100 mL chemo infusion, , Intravenous,  Once, 0 of 4 cycles etoposide (VEPESID) 240 mg in sodium chloride 0.9 % 600 mL chemo infusion, 100 mg/m2, Intravenous,  Once, 0 of 4  cycles fosaprepitant (EMEND) 150 mg, dexamethasone (DECADRON) 12 mg in sodium chloride 0.9 % 145 mL IVPB, , Intravenous,  Once, 0 of 4 cycles atezolizumab (TECENTRIQ) 1,200 mg in sodium chloride 0.9 % 250 mL chemo infusion, 1,200 mg, Intravenous, Once, 0 of 8 cycles  for chemotherapy treatment.      REVIEW OF SYSTEMS:   Constitutional: Denies fevers, chills or abnormal weight loss Eyes: Denies blurriness of vision Ears, nose, mouth, throat, and face: Denies mucositis or sore throat Respiratory: Denies cough, dyspnea or wheezes Cardiovascular: Denies palpitation, chest discomfort or lower extremity swelling Gastrointestinal:  Denies nausea, heartburn or change in bowel habits Skin: Denies abnormal skin rashes Lymphatics: Denies new lymphadenopathy or easy bruising Neurological:Denies numbness, tingling or new weaknesses Behavioral/Psych: Mood is stable, no new changes  All other systems were reviewed with the patient and are negative.  I have reviewed the past medical history, past surgical history, social history and family history with the patient and they are unchanged from previous note.  ALLERGIES:  is allergic to no known allergies and other.  MEDICATIONS:  Current Outpatient Medications  Medication Sig Dispense Refill  . aspirin EC 81 MG tablet Take 81 mg by mouth daily.    Marland Kitchen aspirin-acetaminophen-caffeine (EXCEDRIN MIGRAINE) 250-250-65 MG tablet Take 1 tablet by mouth every 6 (six) hours as needed for headache.    . benzonatate (TESSALON) 100 MG capsule Take 1 capsule (100 mg total) by mouth 3 (three) times daily as needed for cough. [place]  capsule 0  . buPROPion (WELLBUTRIN XL) 300 MG 24 hr tablet Take 300 mg by mouth every morning.    . clopidogrel (PLAVIX) 75 MG tablet Take 1 tablet (75 mg total) by mouth daily. Ok to resume tomorrow 90 tablet 3  . dolutegravir (TIVICAY) 50 MG tablet Take 50 mg by mouth daily.    . empagliflozin (JARDIANCE) 25 MG TABS tablet Take 25 mg by mouth  daily.    Marland Kitchen emtricitabine-tenofovir AF (DESCOVY) 200-25 MG tablet Take 1 tablet by mouth daily.    . furosemide (LASIX) 40 MG tablet Take 40 mg by mouth daily as needed for fluid.     Marland Kitchen lisinopril (PRINIVIL,ZESTRIL) 10 MG tablet Take 1 tablet (10 mg total) by mouth daily. 90 tablet 3  . metFORMIN (GLUCOPHAGE) 1000 MG tablet Take 1,000 mg by mouth 2 (two) times daily with a meal.    . metoprolol tartrate (LOPRESSOR) 25 MG tablet TAKE 1 TABLET BY MOUTH TWICE A DAY (Patient taking differently: Take 50 mg by mouth 2 (two) times daily. TAKE 1 TABLET BY MOUTH TWICE A DAY) 180 tablet 3  . ondansetron (ZOFRAN) 8 MG tablet Take 1 tablet (8 mg total) by mouth every 8 (eight) hours as needed for nausea. 90 tablet 3  . oxyCODONE (OXY IR/ROXICODONE) 5 MG immediate release tablet Take 1-2 tablets (5-10 mg total) by mouth every 4 (four) hours as needed (cancer associated pain). 30 tablet 0  . polyethylene glycol (MIRALAX / GLYCOLAX) packet Take 17 g by mouth daily. 30 each 0  . pravastatin (PRAVACHOL) 40 MG tablet Take 1 tablet (40 mg total) by mouth every evening. 90 tablet 3  . promethazine (PHENERGAN) 25 MG tablet Take 1 tablet (25 mg total) by mouth every 8 (eight) hours as needed for nausea. 30 tablet 0  . senna (SENOKOT) 8.6 MG TABS tablet Take 1 tablet (8.6 mg total) by mouth daily. 30 each 0  . sertraline (ZOLOFT) 100 MG tablet Take 100 mg by mouth every morning.    . sodium chloride 1 g tablet Take 2 tablets (2 g total) by mouth 3 (three) times daily with meals. 90 tablet 1  . zolpidem (AMBIEN) 10 MG tablet Take 5-10 mg by mouth at bedtime as needed for sleep.   3   No current facility-administered medications for this visit.     PHYSICAL EXAMINATION: ECOG PERFORMANCE STATUS: 1 - Symptomatic but completely ambulatory  Vitals:   06/10/18 1117  BP: 135/75  Pulse: (!) 105  Resp: 19  Temp: (!) 97.4 F (36.3 C)  SpO2: 95%   Filed Weights   06/10/18 1117  Weight: 267 lb 6.4 oz (121.3 kg)     GENERAL:alert, no distress and comfortable Musculoskeletal:no cyanosis of digits and no clubbing  NEURO: alert & oriented x 3 with fluent speech, no focal motor/sensory deficits  LABORATORY DATA:  I have reviewed the data as listed    Component Value Date/Time   NA 137 06/10/2018 1105   NA 137 10/02/2016 1023   K 3.8 06/10/2018 1105   CL 104 06/10/2018 1105   CO2 23 06/10/2018 1105   GLUCOSE 151 (H) 06/10/2018 1105   BUN 14 06/10/2018 1105   BUN 15 10/02/2016 1023   CREATININE 0.95 06/10/2018 1105   CALCIUM 9.5 06/10/2018 1105   PROT 7.2 06/10/2018 1105   ALBUMIN 3.2 (L) 06/10/2018 1105   AST 190 (HH) 06/10/2018 1105   ALT 193 (H) 06/10/2018 1105   ALKPHOS 595 (H) 06/10/2018 1105  BILITOT 1.7 (H) 06/10/2018 1105   GFRNONAA >60 06/10/2018 1105   GFRAA >60 06/10/2018 1105    No results found for: SPEP, UPEP  Lab Results  Component Value Date   WBC 8.2 06/10/2018   NEUTROABS 4.8 06/10/2018   HGB 14.8 06/10/2018   HCT 46.0 06/10/2018   MCV 85.8 06/10/2018   PLT 227 06/10/2018      Chemistry      Component Value Date/Time   NA 137 06/10/2018 1105   NA 137 10/02/2016 1023   K 3.8 06/10/2018 1105   CL 104 06/10/2018 1105   CO2 23 06/10/2018 1105   BUN 14 06/10/2018 1105   BUN 15 10/02/2016 1023   CREATININE 0.95 06/10/2018 1105      Component Value Date/Time   CALCIUM 9.5 06/10/2018 1105   ALKPHOS 595 (H) 06/10/2018 1105   AST 190 (HH) 06/10/2018 1105   ALT 193 (H) 06/10/2018 1105   BILITOT 1.7 (H) 06/10/2018 1105       RADIOGRAPHIC STUDIES: I have personally reviewed the radiological images as listed and agreed with the findings in the report. Dg Chest 2 View  Result Date: 05/24/2018 CLINICAL DATA:  Nausea and vomiting for 2 weeks. Shortness of breath and fever for 1 week. EXAM: CHEST - 2 VIEW COMPARISON:  PA and lateral chest 10/08/2017, 02/18/2011 and 09/02/2016. CT chest 09/02/2016. FINDINGS: There is peribronchial thickening. Increased density  projects over the left heart on the frontal view. No pneumothorax pleural effusion. Heart size is normal. The patient is status post CABG. Aortic atherosclerosis is noted. IMPRESSION: Increased density over the left heart is worrisome for lingular pneumonia. Recommend repeat PA and lateral films in 4-6 weeks to ensure resolution. Bronchitic change. Electronically Signed   By: Inge Rise M.D.   On: 05/24/2018 16:21   Ct Head Wo Contrast  Result Date: 05/24/2018 CLINICAL DATA:  Headache and nausea. EXAM: CT HEAD WITHOUT CONTRAST TECHNIQUE: Contiguous axial images were obtained from the base of the skull through the vertex without intravenous contrast. COMPARISON:  05/09/2018 FINDINGS: Brain: there is no evidence for acute hemorrhage, hydrocephalus, mass lesion, or abnormal extra-axial fluid collection. No definite CT evidence for acute infarction. Vascular: No hyperdense vessel or unexpected calcification. Skull: Normal. Negative for fracture or focal lesion. Sinuses/Orbits: No acute finding. Other: None. IMPRESSION: No acute intracranial abnormality. Electronically Signed   By: Misty Stanley M.D.   On: 05/24/2018 17:50   Ct Chest W Contrast  Result Date: 05/25/2018 CLINICAL DATA:  53 year old male with shortness of breath, fever, cough nausea and vomiting. History of HIV. Numerous liver lesion suspected to be metastases on CT Abdomen and Pelvis yesterday with abnormal left lung base. EXAM: CT CHEST WITH CONTRAST TECHNIQUE: Multidetector CT imaging of the chest was performed during intravenous contrast administration. CONTRAST:  14mL OMNIPAQUE IOHEXOL 300 MG/ML  SOLN COMPARISON:  05/24/2018 chest radiographs.  Chest CTA 09/02/2016. FINDINGS: Cardiovascular: CABG since the 2018 CT. Mild cardiomegaly. No pericardial effusion. Mediastinum/Nodes: Extensive mediastinal lymphadenopathy, bulky with individual malignant appearing nodal metastases up to 3-3.5 centimeters short axis. Adenopathy continues to the  thoracic inlet where abnormal level 4 nodes measure 12 millimeter short axis. Chronic thyromegaly and thyroid heterogeneity appears stable since 2018 and is likely benign. Unilateral left hilar lymphadenopathy which is inseparable from abnormal left lower lobe tumor/opacity. Lungs/Pleura: Heterogeneously enhancing lung and soft tissue about the left hilum with in case mint and compression of the branching of the left mainstem bronchus (series 4, image 68). Relatively few  air bronchograms, mostly tracking inferiorly into the left lower lobe where as the opacity about the right hilum seems more compatible with confluent lung tumor. Superimposed trace layering left pleural effusion and/or irregular pleural thickening. Left lung pulmonary septal thickening which might indicate tumor related edema or lymphangitic carcinomatosis. No right lung nodule or mass.  No right pleural effusion. Upper Abdomen: Stable. Hepatomegaly. Numerous hypodense liver lesions were better demonstrated on the abdomen CT yesterday. Musculoskeletal: Prior sternotomy. No acute or suspicious bone lesion identified in the chest. IMPRESSION: 1. Advanced Thoracic Malignancy with combined tumor and atelectatic lung about the left hilum tracking into the left lower lobe. Mass effect on the left hilar airways. Bulky bilateral mediastinal lymphadenopathy, continuing to the thoracic inlet. 2. Liver metastases better demonstrated on the abdomen CT yesterday. No skeletal metastasis identified in the chest by CT. 3. Chronic thyroid goiter appears stable since 2018 and benign. Electronically Signed   By: Genevie Ann M.D.   On: 05/25/2018 23:27   Mr Jeri Cos VE Contrast  Result Date: 05/25/2018 CLINICAL DATA:  53 y/o M; HIV, liver lesions, pneumonia, fever, headache, nausea, vomiting, abdominal pain. EXAM: MRI HEAD WITHOUT AND WITH CONTRAST TECHNIQUE: Multiplanar, multiecho pulse sequences of the brain and surrounding structures were obtained without and with  intravenous contrast. CONTRAST:  10 cc Gadavist COMPARISON:  05/24/2018 CT head FINDINGS: Brain: Thickening and enhancement of the infundibulum and hypothalamus measuring up to 7 mm (series 18, image 25-27 and series 9, image 12). No additional focus of intracranial abnormal enhancement. No reduced diffusion to suggest acute or early subacute infarction. No abnormal susceptibility hypointensity to indicate intracranial hemorrhage. No extra-axial collection, hydrocephalus, mass effect, or herniation. Several punctate nonspecific T2 FLAIR hyperintensities in subcortical and periventricular white matter are compatible with mild chronic microvascular ischemic changes. Mild volume loss of the brain. Vascular: Normal flow voids. Skull and upper cervical spine: There several T1 and T2 hypointense lesions within the calvarium demonstrate diffusion hyperintensity and there is appreciable enhancement of the largest lesion in left parietal bone (series 5, image 104 and 105 as well as series 18, image 53). Sinuses/Orbits: Negative. Other: None. IMPRESSION: 1. Several calvarial lesions, the largest lesion in the left parietal bone. Findings are suspicious for metastatic disease. 2. Nonspecific diffuse thickening and enhancement of infundibulum and hypothalamus. Differential includes sarcoidosis, histiocytosis, lymphocytic hypophysitis, infectious process such as syphilis, pseudotumor, lymphoproliferative disease, or metastasis. 3. Mild chronic microvascular ischemic changes and volume loss of the brain. Electronically Signed   By: Kristine Garbe M.D.   On: 05/25/2018 20:01   Ct Abdomen Pelvis W Contrast  Result Date: 05/24/2018 CLINICAL DATA:  Nausea vomiting and abdominal pain EXAM: CT ABDOMEN AND PELVIS WITH CONTRAST TECHNIQUE: Multidetector CT imaging of the abdomen and pelvis was performed using the standard protocol following bolus administration of intravenous contrast. CONTRAST:  11mL ISOVUE-300 IOPAMIDOL  (ISOVUE-300) INJECTION 61% COMPARISON:  06/29/2014 FINDINGS: Lower chest: 3.7 cm short axis subcarinal lymph node is associated with bulky lymphadenopathy in the left hilum and nodular airspace opacity left lower lobe measuring up to 3.8 cm. Tiny left pleural effusion associated. Hepatobiliary: Liver shows innumerable ill-defined low-density lesions consistent with metastatic disease. Dominant lesion in the inferior right liver measures 5.6 cm (49/2). Index lesion posterior right liver measures 3.6 cm on 30 /2. There is no evidence for gallstones, gallbladder wall thickening, or pericholecystic fluid. No intrahepatic or extrahepatic biliary dilation. Pancreas: No focal mass lesion. No dilatation of the main duct. No intraparenchymal cyst. No peripancreatic  edema. Spleen: No splenomegaly. No focal mass lesion. Adrenals/Urinary Tract: No adrenal nodule or mass. 15 mm exophytic low-density lesion lower pole right kidney is probably a cyst. 3.7 cm lesion interpolar left kidney approaches water attenuation, also likely a cyst. No evidence for hydroureter. The urinary bladder appears normal for the degree of distention. Stomach/Bowel: Stomach is unremarkable. No gastric wall thickening. No evidence of outlet obstruction. Duodenum is normally positioned as is the ligament of Treitz. No small bowel wall thickening. No small bowel dilatation. The terminal ileum is normal. The appendix is normal. No gross colonic mass. No colonic wall thickening. Vascular/Lymphatic: There is abdominal aortic atherosclerosis without aneurysm. Bulky lymphadenopathy is seen in the hepato duodenal ligament. Index 2.9 cm short axis lymph node identified on 39/2. 2.5 cm short axis pre caval lymph node visible on 42/2. Para-aortic lymphadenopathy is associated with 1.4 cm short axis aortocaval node visible on 50/2. No pelvic sidewall lymphadenopathy. Reproductive: The prostate gland and seminal vesicles are unremarkable. Other: No intraperitoneal  free fluid. Musculoskeletal: Sclerotic lesion in the left femoral neck is stable since prior. No worrisome lytic or sclerotic osseous abnormality. IMPRESSION: 1. Innumerable ill-defined low-density liver lesions consistent with metastatic disease. 2. Bulky lymphadenopathy in the hepato duodenal ligament with para-aortic lymphadenopathy in the retroperitoneal space of the abdomen, findings consistent with metastatic involvement. 3. Incompletely visualized but markedly enlarged subcarinal lymph node is associated with apparent bulky left hilar lymphadenopathy and airspace disease in the left lower lobe. Dedicated CT chest with contrast material recommended to further evaluate. 4. Bilateral renal cysts. 5.  Aortic Atherosclerois (ICD10-170.0) Electronically Signed   By: Misty Stanley M.D.   On: 05/24/2018 17:46   Ir US Guide Bx Asp/drain  Result Date: 06/01/2018 INDICATION: Concern for metastatic lung cancer. Please perform ultrasound-guided liver lesion biopsy for tissue diagnostic purposes. EXAM: ULTRASOUND GUIDED LIVER LESION BIOPSY COMPARISON:  None. MEDICATIONS: None ANESTHESIA/SEDATION: Fentanyl 100 mcg IV; Versed 2 mg IV Total Moderate Sedation time:  13 Minutes. The patient's level of consciousness and vital signs were monitored continuously by radiology nursing throughout the procedure under my direct supervision. COMPLICATIONS: None immediate. PROCEDURE: Informed written consent was obtained from the patient after a discussion of the risks, benefits and alternatives to treatment. The patient understands and consents the procedure. A timeout was performed prior to the initiation of the procedure. Ultrasound scanning was performed of the right upper abdominal quadrant demonstrates multiple hypoechoic liver lesions and masses. Dominant approximately 3.0 x 2.1 cm hypoechoic lesion within the subcapsular aspect the right lobe of the liver was targeted for biopsy given lesion location and sonographic window.  The procedure was planned. The right upper abdominal quadrant was prepped and draped in the usual sterile fashion. The overlying soft tissues were anesthetized with 1% lidocaine with epinephrine. A 17 gauge, 6.8 cm co-axial needle was advanced into a peripheral aspect of the lesion. This was followed by 5 core biopsies with an 18 gauge core device under direct ultrasound guidance. The coaxial needle tract was embolized with a small amount of Gel-Foam slurry and superficial hemostasis was obtained with manual compression. Post procedural scanning was negative for definitive area of hemorrhage or additional complication. A dressing was placed. The patient tolerated the procedure well without immediate post procedural complication. IMPRESSION: Technically successful ultrasound guided core needle biopsy of indeterminate lesion within the subcapsular aspect the right lobe of the liver. Electronically Signed   By: Sandi Mariscal M.D.   On: 06/01/2018 12:10    All questions were  answered. The patient knows to call the clinic with any problems, questions or concerns. No barriers to learning was detected.  I spent 40 minutes counseling the patient face to face. The total time spent in the appointment was 55 minutes and more than 50% was on counseling and review of test results  Heath Lark, MD 06/11/2018 8:11 AM

## 2018-06-11 NOTE — Assessment & Plan Note (Addendum)
He has abnormal thyroid nodule with thyromegaly TSH is profoundly suppressed but free thyroxine is normal. It could be related to sick euthyroid syndrome. I recommend recheck thyroid function or referral to endocrinologist in the near future

## 2018-06-11 NOTE — Assessment & Plan Note (Signed)
I have discussed this with neuro oncologist.  He recommend close observation and repeat imaging study in 3 months

## 2018-06-11 NOTE — Assessment & Plan Note (Signed)
He has significant liver enzymes abnormalities due to metastasis of the liver If he chooses to be treated here, I recommend dose adjustment with reduced dose etoposide

## 2018-06-11 NOTE — Telephone Encounter (Signed)
Called and given below message. He verbalized understanding. Given lab results on phone. Faxed office note and labs to the attention of Abigail Butts at [phone] per his request.

## 2018-06-11 NOTE — Assessment & Plan Note (Signed)
After our last discussion last week, the patient would like to start treatment as soon as possible.  Currently, he is not symptomatic.  He has just completed palliative radiation treatment and he denies significant cough or shortness of breath We discussed the risks, benefits, side effects of treatment with carboplatin, etoposide and Atezolizumab. Expected side effects included, though not limited to, pancytopenia, risk of infection, possible need for transfusion, hospitalization for various reason, renal dysfunction, allergic reaction and possibly death. The patient would like to proceed with treatment Due to the timing of his schedule, it is not possible to get him a port placed before treatment The patient is also contemplating relocating to another state to be closer to family members.  He will call me at the end of the week for final decision He still need to attend chemo education class before starting treatment and he is aware of that requirement.

## 2018-06-11 NOTE — Telephone Encounter (Signed)
-----   Message from Heath Lark, MD sent at 06/11/2018  8:11 AM EST ----- Regarding: abnormal blood tests Pls call him. His blood tests (liver and thyroid were abnormal). I am sure his new oncologist will be repeat those blood tests. I just signed my notes. We can fax a copy of blood tests and notes to his oncologist office before his appt

## 2018-06-11 NOTE — Assessment & Plan Note (Signed)
He will continue antiretroviral treatment

## 2018-06-11 NOTE — Assessment & Plan Note (Signed)
We discussed goals of care is strictly palliative

## 2018-06-12 ENCOUNTER — Encounter: Payer: Self-pay | Admitting: Radiation Oncology

## 2018-06-12 ENCOUNTER — Inpatient Hospital Stay (HOSPITAL_COMMUNITY): Payer: Commercial Managed Care - PPO | Admitting: Internal Medicine

## 2018-06-12 ENCOUNTER — Inpatient Hospital Stay
Admission: AD | Admit: 2018-06-12 | Discharge: 2018-06-17 | DRG: 847 | Disposition: A | Discharge status: Home / Self Care | Payer: Commercial Managed Care - PPO | Source: Ambulatory Visit | Attending: Internal Medicine | Admitting: Internal Medicine

## 2018-06-12 ENCOUNTER — Ambulatory Visit (HOSPITAL_BASED_OUTPATIENT_CLINIC_OR_DEPARTMENT_OTHER)
Admission: RE | Admit: 2018-06-12 | Discharge: 2018-06-12 | Disposition: A | Discharge status: Home / Self Care | Payer: Commercial Managed Care - PPO | Source: Ambulatory Visit

## 2018-06-12 ENCOUNTER — Other Ambulatory Visit: Payer: Self-pay

## 2018-06-12 ENCOUNTER — Inpatient Hospital Stay (HOSPITAL_BASED_OUTPATIENT_CLINIC_OR_DEPARTMENT_OTHER): Payer: Commercial Managed Care - PPO

## 2018-06-12 ENCOUNTER — Ambulatory Visit (HOSPITAL_BASED_OUTPATIENT_CLINIC_OR_DEPARTMENT_OTHER): Payer: Commercial Managed Care - PPO | Admitting: General Practice

## 2018-06-12 ENCOUNTER — Encounter (HOSPITAL_BASED_OUTPATIENT_CLINIC_OR_DEPARTMENT_OTHER): Payer: Self-pay | Admitting: General Practice

## 2018-06-12 DIAGNOSIS — C7931 Secondary malignant neoplasm of brain: Secondary | ICD-10-CM | POA: Diagnosis present

## 2018-06-12 DIAGNOSIS — E119 Type 2 diabetes mellitus without complications: Secondary | ICD-10-CM | POA: Diagnosis present

## 2018-06-12 DIAGNOSIS — Z79891 Long term (current) use of opiate analgesic: Secondary | ICD-10-CM

## 2018-06-12 DIAGNOSIS — Z6841 Body Mass Index (BMI) 40.0 and over, adult: Secondary | ICD-10-CM

## 2018-06-12 DIAGNOSIS — C7951 Secondary malignant neoplasm of bone: Secondary | ICD-10-CM | POA: Diagnosis present

## 2018-06-12 DIAGNOSIS — R9431 Abnormal electrocardiogram [ECG] [EKG]: Secondary | ICD-10-CM

## 2018-06-12 DIAGNOSIS — C3490 Malignant neoplasm of unspecified part of unspecified bronchus or lung: Secondary | ICD-10-CM | POA: Diagnosis present

## 2018-06-12 DIAGNOSIS — Z7982 Long term (current) use of aspirin: Secondary | ICD-10-CM

## 2018-06-12 DIAGNOSIS — Z923 Personal history of irradiation: Secondary | ICD-10-CM

## 2018-06-12 DIAGNOSIS — B20 Human immunodeficiency virus [HIV] disease: Secondary | ICD-10-CM

## 2018-06-12 DIAGNOSIS — I10 Essential (primary) hypertension: Secondary | ICD-10-CM | POA: Diagnosis present

## 2018-06-12 DIAGNOSIS — I2510 Atherosclerotic heart disease of native coronary artery without angina pectoris: Secondary | ICD-10-CM | POA: Diagnosis present

## 2018-06-12 DIAGNOSIS — Z8042 Family history of malignant neoplasm of prostate: Secondary | ICD-10-CM

## 2018-06-12 DIAGNOSIS — Z5111 Encounter for antineoplastic chemotherapy: Principal | ICD-10-CM

## 2018-06-12 DIAGNOSIS — Z955 Presence of coronary angioplasty implant and graft: Secondary | ICD-10-CM

## 2018-06-12 DIAGNOSIS — C787 Secondary malignant neoplasm of liver and intrahepatic bile duct: Secondary | ICD-10-CM | POA: Diagnosis present

## 2018-06-12 DIAGNOSIS — Z951 Presence of aortocoronary bypass graft: Secondary | ICD-10-CM

## 2018-06-12 DIAGNOSIS — G4700 Insomnia, unspecified: Secondary | ICD-10-CM | POA: Diagnosis present

## 2018-06-12 DIAGNOSIS — Z7902 Long term (current) use of antithrombotics/antiplatelets: Secondary | ICD-10-CM

## 2018-06-12 DIAGNOSIS — Z7984 Long term (current) use of oral hypoglycemic drugs: Secondary | ICD-10-CM

## 2018-06-12 DIAGNOSIS — Z79899 Other long term (current) drug therapy: Secondary | ICD-10-CM

## 2018-06-12 DIAGNOSIS — Z87891 Personal history of nicotine dependence: Secondary | ICD-10-CM

## 2018-06-12 DIAGNOSIS — R590 Localized enlarged lymph nodes: Secondary | ICD-10-CM | POA: Diagnosis present

## 2018-06-12 DIAGNOSIS — Z800 Family history of malignant neoplasm of digestive organs: Secondary | ICD-10-CM

## 2018-06-12 DIAGNOSIS — G8929 Other chronic pain: Secondary | ICD-10-CM | POA: Diagnosis present

## 2018-06-12 DIAGNOSIS — E042 Nontoxic multinodular goiter: Secondary | ICD-10-CM | POA: Diagnosis present

## 2018-06-12 DIAGNOSIS — C772 Secondary and unspecified malignant neoplasm of intra-abdominal lymph nodes: Secondary | ICD-10-CM | POA: Diagnosis present

## 2018-06-12 DIAGNOSIS — R000 Tachycardia, unspecified: Secondary | ICD-10-CM

## 2018-06-12 DIAGNOSIS — E0781 Sick-euthyroid syndrome: Secondary | ICD-10-CM | POA: Diagnosis present

## 2018-06-12 HISTORY — DX: Human immunodeficiency virus (HIV) disease (CMS HCC): B20

## 2018-06-12 HISTORY — DX: Type 2 diabetes mellitus without complications (CMS HCC): E11.9

## 2018-06-12 LAB — ECG 12-LEAD
Atrial Rate: 103 {beats}/min
Calculated P Axis: 45 degrees
Calculated R Axis: 56 degrees
Calculated T Axis: 94 degrees
PR Interval: 152 ms
QRS Duration: 100 ms
QT Interval: 382 ms
QTC Calculation: 500 ms
Ventricular rate: 103 {beats}/min

## 2018-06-12 LAB — CBC WITH DIFF
BASOPHIL #: 0.14 x10ˆ3/uL (ref <=0.20)
BASOPHIL %: 2 %
EOSINOPHIL #: <0.1 10*3/uL (ref <=0.50)
EOSINOPHIL %: 0 %
HCT: 42.5 % (ref 38.9–52.0)
HGB: 13.6 g/dL (ref 13.4–17.5)
IMMATURE GRANULOCYTE #: <0.1 10*3/uL (ref <0.10)
IMMATURE GRANULOCYTE %: 1 % (ref 0–1)
LYMPHOCYTE #: 1.16 x10ˆ3/uL (ref 1.00–4.80)
LYMPHOCYTE %: 14 %
MCH: 27.4 pg (ref 26.0–32.0)
MCHC: 32 g/dL (ref 31.0–35.5)
MCV: 85.5 fL (ref 78.0–100.0)
MONOCYTE #: 0.81 x10ˆ3/uL (ref 0.20–1.10)
MONOCYTE %: 10 %
MPV: 9.8 fL (ref 8.7–12.5)
NEUTROPHIL #: 5.89 10*3/uL (ref 1.50–7.70)
NEUTROPHIL %: 73 %
PLATELETS: 185 x10ˆ3/uL (ref 150–400)
RBC: 4.97 x10ˆ6/uL (ref 4.50–6.10)
RDW-CV: 16.4 % — ABNORMAL HIGH (ref 11.5–15.5)
WBC: 8.1 10*3/uL (ref 3.7–11.0)

## 2018-06-12 LAB — HEPATIC FUNCTION PANEL
ALBUMIN: 2.7 g/dL — ABNORMAL LOW (ref 3.5–5.0)
ALKALINE PHOSPHATASE: 621 U/L — ABNORMAL HIGH (ref 45–115)
ALKALINE PHOSPHATASE: 621 U/L — ABNORMAL HIGH (ref 45–115)
ALT (SGPT): 221 U/L — ABNORMAL HIGH (ref <55)
AST (SGOT): 210 U/L — ABNORMAL HIGH (ref 8–48)
BILIRUBIN DIRECT: 2.4 mg/dL — ABNORMAL HIGH (ref <0.3)
BILIRUBIN TOTAL: 2.9 mg/dL — ABNORMAL HIGH (ref 0.3–1.3)
PROTEIN TOTAL: 6.6 g/dL (ref 6.4–8.3)

## 2018-06-12 LAB — PHOSPHORUS: PHOSPHORUS: 3 mg/dL (ref 2.4–4.7)

## 2018-06-12 LAB — BASIC METABOLIC PANEL
ANION GAP: 13 mmol/L (ref 4–13)
BUN/CREA RATIO: 14 (ref 6–22)
BUN: 12 mg/dL (ref 8–25)
CALCIUM: 9.3 mg/dL (ref 8.5–10.2)
CHLORIDE: 102 mmol/L (ref 96–111)
CO2 TOTAL: 19 mmol/L — ABNORMAL LOW (ref 22–32)
CREATININE: 0.87 mg/dL (ref 0.62–1.27)
ESTIMATED GFR: >60 mL/min/1.73mˆ2 (ref >60)
GLUCOSE: 200 mg/dL — ABNORMAL HIGH (ref 65–139)
POTASSIUM: 3.5 mmol/L (ref 3.5–5.1)
SODIUM: 134 mmol/L — ABNORMAL LOW (ref 136–145)

## 2018-06-12 LAB — URIC ACID: URIC ACID: 5.8 mg/dL (ref 3.5–7.5)

## 2018-06-12 LAB — POC BLOOD GLUCOSE (RESULTS): GLUCOSE, POC: 166 mg/dL — ABNORMAL HIGH (ref 70–105)

## 2018-06-12 LAB — LDH: LDH: 1165 U/L — ABNORMAL HIGH (ref 125–220)

## 2018-06-12 LAB — HGA1C (HEMOGLOBIN A1C WITH EST AVG GLUCOSE)
ESTIMATED AVERAGE GLUCOSE: 174 mg/dL
HEMOGLOBIN A1C: 7.7 % — ABNORMAL HIGH (ref 4.0–5.6)

## 2018-06-12 LAB — MAGNESIUM: MAGNESIUM: 1.9 mg/dL (ref 1.6–2.6)

## 2018-06-12 MED ORDER — POLYETHYLENE GLYCOL 3350 17 GRAM ORAL POWDER PACKET
17.0000 g | Freq: Every day | ORAL | Status: DC
Start: 2018-06-13 — End: 2018-06-17
  Administered 2018-06-13 – 2018-06-17 (×5): 0 g via ORAL
  Filled 2018-06-12 (×4): qty 1

## 2018-06-12 MED ORDER — SODIUM CHLORIDE 1 GRAM TABLET
1.00 g | ORAL_TABLET | Freq: Three times a day (TID) | ORAL | Status: DC
Start: 2018-06-13 — End: 2018-06-17
  Administered 2018-06-13 – 2018-06-17 (×14): 1 g via ORAL
  Filled 2018-06-12 (×7): qty 1
  Filled 2018-06-12: qty 2
  Filled 2018-06-12 (×5): qty 1

## 2018-06-12 MED ORDER — METOPROLOL TARTRATE 25 MG TABLET
25.00 mg | ORAL_TABLET | Freq: Two times a day (BID) | ORAL | Status: DC
Start: 2018-06-12 — End: 2018-06-17
  Administered 2018-06-12 – 2018-06-17 (×10): 25 mg via ORAL
  Filled 2018-06-12 (×10): qty 1

## 2018-06-12 MED ORDER — ONDANSETRON HCL 4 MG TABLET
4.00 mg | ORAL_TABLET | Freq: Three times a day (TID) | ORAL | Status: DC | PRN
Start: 2018-06-12 — End: 2018-06-14
  Administered 2018-06-13 – 2018-06-14 (×2): 4 mg via ORAL
  Filled 2018-06-12 (×2): qty 1

## 2018-06-12 MED ORDER — ENOXAPARIN 40 MG/0.4 ML SUBCUTANEOUS SYRINGE
40.0000 mg | INJECTION | SUBCUTANEOUS | Status: DC
Start: 2018-06-12 — End: 2018-06-17
  Administered 2018-06-12 – 2018-06-16 (×5): 0 mg via SUBCUTANEOUS
  Filled 2018-06-12 (×5): qty 0.4

## 2018-06-12 MED ORDER — DOLUTEGRAVIR 50 MG TABLET
50.0000 mg | ORAL_TABLET | Freq: Every day | ORAL | Status: DC
Start: 2018-06-13 — End: 2018-06-17
  Administered 2018-06-13 – 2018-06-17 (×5): 50 mg via ORAL
  Filled 2018-06-12 (×5): qty 1

## 2018-06-12 MED ORDER — SERTRALINE 100 MG TABLET
100.0000 mg | ORAL_TABLET | Freq: Every day | ORAL | Status: DC
Start: 2018-06-13 — End: 2018-06-17
  Administered 2018-06-13 – 2018-06-17 (×5): 100 mg via ORAL
  Filled 2018-06-12 (×5): qty 1

## 2018-06-12 MED ORDER — SODIUM CHLORIDE 0.9 % (FLUSH) INJECTION SYRINGE
2.0000 mL | INJECTION | Freq: Three times a day (TID) | INTRAMUSCULAR | Status: DC
Start: 2018-06-12 — End: 2018-06-17
  Administered 2018-06-12 – 2018-06-13 (×2): 2 mL
  Administered 2018-06-13 – 2018-06-14 (×3): 0 mL
  Administered 2018-06-14 (×2): 2 mL
  Administered 2018-06-15: 0 mL
  Administered 2018-06-15: 2 mL
  Administered 2018-06-15: 0 mL
  Administered 2018-06-16: 2 mL
  Administered 2018-06-16: 0 mL
  Administered 2018-06-16: 2 mL
  Administered 2018-06-17 (×2): 0 mL

## 2018-06-12 MED ORDER — ASPIRIN 81 MG TABLET,DELAYED RELEASE
81.0000 mg | DELAYED_RELEASE_TABLET | Freq: Every day | ORAL | Status: DC
Start: 2018-06-13 — End: 2018-06-17
  Administered 2018-06-13 – 2018-06-17 (×5): 81 mg via ORAL
  Filled 2018-06-12 (×5): qty 1

## 2018-06-12 MED ORDER — PRAVASTATIN 40 MG TABLET
40.00 mg | ORAL_TABLET | Freq: Every evening | ORAL | Status: DC
Start: 2018-06-12 — End: 2018-06-12
  Administered 2018-06-12: 40 mg via ORAL
  Filled 2018-06-12 (×2): qty 1

## 2018-06-12 MED ORDER — SENNA LEAF EXTRACT 176 MG/5 ML ORAL SYRUP
5.0000 mL | ORAL_SOLUTION | Freq: Every day | ORAL | Status: DC
Start: 2018-06-13 — End: 2018-06-17
  Administered 2018-06-13 – 2018-06-17 (×5): 0 mg via ORAL
  Filled 2018-06-12 (×4): qty 15

## 2018-06-12 MED ORDER — BENZONATATE 100 MG CAPSULE
100.00 mg | ORAL_CAPSULE | Freq: Three times a day (TID) | ORAL | Status: DC | PRN
Start: 2018-06-12 — End: 2018-06-17

## 2018-06-12 MED ORDER — PROMETHAZINE 25 MG TABLET
25.00 mg | ORAL_TABLET | Freq: Three times a day (TID) | ORAL | Status: DC | PRN
Start: 2018-06-12 — End: 2018-06-17
  Administered 2018-06-17: 25 mg via ORAL
  Filled 2018-06-12: qty 1

## 2018-06-12 MED ORDER — OXYCODONE 5 MG TABLET
5.0000 mg | ORAL_TABLET | ORAL | Status: DC | PRN
Start: 2018-06-12 — End: 2018-06-17
  Administered 2018-06-12 – 2018-06-17 (×21): 5 mg via ORAL
  Filled 2018-06-12 (×21): qty 1

## 2018-06-12 MED ORDER — INSULIN LISPRO 100 UNIT/ML SUB-Q SSIP
0.00 [IU] | INJECTION | Freq: Four times a day (QID) | SUBCUTANEOUS | Status: DC | PRN
Start: 2018-06-12 — End: 2018-06-17
  Administered 2018-06-12 – 2018-06-15 (×7): 3 [IU] via SUBCUTANEOUS
  Administered 2018-06-16: 6 [IU] via SUBCUTANEOUS
  Administered 2018-06-16 – 2018-06-17 (×2): 3 [IU] via SUBCUTANEOUS
  Filled 2018-06-12: qty 3

## 2018-06-12 MED ORDER — SODIUM CHLORIDE 0.9 % (FLUSH) INJECTION SYRINGE
2.0000 mL | INJECTION | INTRAMUSCULAR | Status: DC | PRN
Start: 2018-06-12 — End: 2018-06-17

## 2018-06-12 MED ORDER — BUPROPION HCL SR 150 MG TABLET,12 HR SUSTAINED-RELEASE
150.00 mg | ORAL_TABLET | Freq: Two times a day (BID) | ORAL | Status: DC
Start: 2018-06-12 — End: 2018-06-17
  Administered 2018-06-12 – 2018-06-17 (×10): 150 mg via ORAL
  Filled 2018-06-12 (×10): qty 1

## 2018-06-12 MED ORDER — ONDANSETRON HCL (PF) 4 MG/2 ML INJECTION SOLUTION
8.00 mg | INTRAMUSCULAR | Status: AC
Start: 2018-06-12 — End: 2018-06-12
  Filled 2018-06-12: qty 4

## 2018-06-12 MED ORDER — EMTRICITABINE 200 MG-TENOFOVIR ALAFENAMIDE FUMARATE 25 MG TABLET
1.0000 | ORAL_TABLET | Freq: Every day | ORAL | Status: DC
Start: 2018-06-13 — End: 2018-06-17
  Administered 2018-06-13 – 2018-06-17 (×5): 1 via ORAL
  Filled 2018-06-12 (×5): qty 1

## 2018-06-12 MED ORDER — ZOLPIDEM 5 MG TABLET
10.00 mg | ORAL_TABLET | Freq: Every evening | ORAL | Status: DC | PRN
Start: 2018-06-12 — End: 2018-06-17
  Administered 2018-06-12 – 2018-06-16 (×5): 10 mg via ORAL
  Filled 2018-06-12 (×5): qty 2

## 2018-06-12 MED ADMIN — lactated Ringers intravenous solution: ORAL | @ 20:00:00 | NDC 00264775000

## 2018-06-12 MED ADMIN — sodium chloride 0.9 % intravenous solution: @ 20:00:00

## 2018-06-12 NOTE — Progress Notes (Signed)
  Radiation Oncology         [phone]) [phone] ________________________________  Name: [name] MRN: [id number]  Date: 06/12/2018  DOB: 1965-09-06  End of Treatment Note  Diagnosis:   53 y.o. 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  Indication for treatment:  Palliative       Radiation treatment dates:   05/27/2018 - 06/09/2018  Site/dose:   Lung, Left Lower / 30 Gy in 10 fractions of 3 Gy  Beams/energy:   3D, photons / 10X, 15X, 6X  Narrative: The patient tolerated radiation treatment relatively well. He reported easily becoming short of breath, moderate to severe fatigue, and decreased appetite during treatment. He developed a dry cough by the end of treatment. He denied issues swallowing, pain, and skin changes throughout treatment.  Plan: The patient has completed radiation treatment. The patient will return to radiation oncology clinic for routine followup in one month. I advised him to call or return sooner if he has any questions or concerns related to his recovery or treatment. ________________________________  Sheral Apley. Tammi Klippel, M.D.   This document serves as a record of services personally performed by Tyler Pita, MD. It was created on his behalf by Wilburn Mylar, a trained medical scribe. The creation of this record is based on the scribe's personal observations and the provider's statements to them. This document has been checked and approved by the attending provider.

## 2018-06-12 NOTE — Cancer Center Note (Signed)
Admit to [facility] for extensive stage small cell lung cancer. Patient has been treated at outside facility [facility] in [place], [place]). He was diagnosed on Jun 01, 2018 from core needle biopsy of liver metastases. He completed palliative radiation to the chest (30 Gy/10 fractions) earlier this week. Patient needs PET CT prior to starting therapy (if possible) to evaluate disease burden. Patient should then be started on chemotherapy with carboplatin and etoposide. Will plan to add immunotherapy (atezolizumab) with cycle 2.     Dorise Hiss, MSN, APRN, FNP-C

## 2018-06-12 NOTE — Nurses Notes (Addendum)
1624: arrived to infusion area to wait for admission bed assignment. Zofran administered per orders to PIv. Dao Memmott Tamala Julian, RN  1702-Report called to [employer] on 9W, room is ready, W. Ivania Teagarden RN notified.  Raliegh Ip Swager RN  [phone]; Patient transported to [facility] by MA. Marchetta Navratil Tamala Julian, RN
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
## 2018-06-12 NOTE — Nursing Note (Signed)
Caregiver Assessment and Education        Patient Caregiver: Vita Barley (sister) [phone], Nickalas Mccarrick (mother) [phone] or [phone].        Patient gives verbal permission to the [facility] clinic staff to discuss medical information with the caregiver: yes       Caregiver assessed for needs, abilities, preferences and readiness to learn.      [employer] Caring for the Caregiver and When Someone You Love Is Being Treated for Cancer was verbally reviewed and provided a copy to caregiver.    Caregiver was engaged during the caregiver education.    Feedback received from caregiver was a verbal understanding of treatment plan.

## 2018-06-12 NOTE — Care Management Notes (Signed)
[facility] DIRECT ADMIT    RESERVATION INFORMATION  Received call from:   Phone:   Referring Provider: Merrie Roof   Referring Provider Phone: [phone]; pager [phone]    Transfer Source: [place]     Transfer Emergent:  Urgent  Transfer Reason: Patient not currently Hospitalized  Transfer Comments: Starting Urgent Chemotherapy  Admitted on:    Pt Class and Level of Care:    Diagnosis: Small Cell Lung CA      Admitting Pt Class and Level of Care: Inpatient, Semi-Private,   Accepted By: Nonie Hoyer, HOSPITALIST 90    *Send Text Page to accepting service MD. *

## 2018-06-12 NOTE — Nursing Note (Signed)
Treatment Plan    Diagnosis: small cell lung cancer. [place] disease specific written information provided and verbally reviewed.    Distress tool completed, level [place] Chemotherapy and You written information provided and verbally reviewed.    ASCO answers Understanding Immunotherapy written information provided and verbally reviewed.    [place] telephone triage brochure and magnet provided with verbal instructions on how to access this support should the patient develop symptoms, side effects and when necessary go to a local emergency room.    Chemotherapy at Home verbally reviewed and provided written handout.    Safe Handling of Chemotherapy Waste Material verbally reviewed and provided written handout.    Verbally reviewed that appointments may be rescheduled but missed appointments will be followed up with a phone call or letter from our clinic. Provided a written copy of [facility]'s and [facility]'s appointment policies.    Oriented to the [facility].Verbally reviewed cancer support services offered at the [place]: home medical supply service, chaplain, psychosocial/supportive care clinic, registered dietician, social workers, pharmacy specialists, financial counselors and monthly support groups.  Patient and family verbalized understanding of treatment plan. Copy provided to patient.        Patient being direct admitted to start chemotherapy.

## 2018-06-12 NOTE — Nurses Notes (Signed)
pt sitting in bed,  calm no complaints call light within reach, phone close.

## 2018-06-12 NOTE — Nurses Notes (Signed)
Pt admitted to room 904. Pt oriented to room and call bell system. Educated on menu ordering and bed controls. Pt has glasses with them. IV access maintained from the cancer center. Admission assessment and vital signs as documented. The pt does not voice any concerns at this time.     Bluford Kaufmann, RN  06/12/2018, 17:36

## 2018-06-12 NOTE — Nursing Note (Signed)
Patient to be admitted to hospital for small cell lung cancer to start treatment.  [facility] line notified for bed at 11:17am.  PIV placed and capped.  Raliegh Ip Bionca Mckey RN

## 2018-06-13 ENCOUNTER — Inpatient Hospital Stay (HOSPITAL_COMMUNITY): Payer: Commercial Managed Care - PPO

## 2018-06-13 DIAGNOSIS — C787 Secondary malignant neoplasm of liver and intrahepatic bile duct: Secondary | ICD-10-CM

## 2018-06-13 DIAGNOSIS — C3490 Malignant neoplasm of unspecified part of unspecified bronchus or lung: Secondary | ICD-10-CM

## 2018-06-13 DIAGNOSIS — R740 Nonspecific elevation of levels of transaminase and lactic acid dehydrogenase [LDH]: Secondary | ICD-10-CM

## 2018-06-13 DIAGNOSIS — E041 Nontoxic single thyroid nodule: Secondary | ICD-10-CM

## 2018-06-13 DIAGNOSIS — K8689 Other specified diseases of pancreas: Secondary | ICD-10-CM

## 2018-06-13 DIAGNOSIS — Z21 Asymptomatic human immunodeficiency virus [HIV] infection status: Secondary | ICD-10-CM

## 2018-06-13 HISTORY — DX: Morbid (severe) obesity due to excess calories (CMS HCC): E66.01

## 2018-06-13 HISTORY — DX: Asymptomatic human immunodeficiency virus (hiv) infection status (CMS HCC): Z21

## 2018-06-13 LAB — CBC WITH DIFF
BASOPHIL #: 0.13 x10ˆ3/uL (ref <=0.20)
BASOPHIL %: 2 %
BASOPHIL %: 2 %
EOSINOPHIL #: <0.1 10*3/uL (ref <=0.50)
EOSINOPHIL #: <0.1 x10ˆ3/uL (ref <=0.50)
EOSINOPHIL %: 0 %
HCT: 40.9 % (ref 38.9–52.0)
HCT: 40.9 % (ref 38.9–52.0)
HGB: 13.1 g/dL — ABNORMAL LOW (ref 13.4–17.5)
HGB: 13.1 g/dL — ABNORMAL LOW (ref 13.4–17.5)
IMMATURE GRANULOCYTE #: <0.1 x10ˆ3/uL (ref <0.10)
IMMATURE GRANULOCYTE %: 1 % (ref 0–1)
LYMPHOCYTE #: 1.35 x10ˆ3/uL (ref 1.00–4.80)
LYMPHOCYTE %: 18 %
LYMPHOCYTE %: 18 %
MCH: 27.5 pg (ref 26.0–32.0)
MCHC: 32 g/dL (ref 31.0–35.5)
MCV: 85.7 fL (ref 78.0–100.0)
MONOCYTE #: 0.74 x10ˆ3/uL (ref 0.20–1.10)
MONOCYTE %: 10 %
MPV: 10 fL (ref 8.7–12.5)
NEUTROPHIL #: 5.43 x10ˆ3/uL (ref 1.50–7.70)
NEUTROPHIL %: 69 %
PLATELETS: 158 10*3/uL (ref 150–400)
RBC: 4.77 x10ˆ6/uL (ref 4.50–6.10)
RDW-CV: 16.3 % — ABNORMAL HIGH (ref 11.5–15.5)
WBC: 7.7 x10ˆ3/uL (ref 3.7–11.0)

## 2018-06-13 LAB — POC BLOOD GLUCOSE (RESULTS)
GLUCOSE, POC: 156 mg/dL — ABNORMAL HIGH (ref 70–105)
GLUCOSE, POC: 118 mg/dL — ABNORMAL HIGH (ref 70–105)
GLUCOSE, POC: 107 mg/dL — ABNORMAL HIGH (ref 70–105)
GLUCOSE, POC: 111 mg/dL — ABNORMAL HIGH (ref 70–105)

## 2018-06-13 LAB — BASIC METABOLIC PANEL
ANION GAP: 11 mmol/L (ref 4–13)
BUN/CREA RATIO: 17 (ref 6–22)
BUN: 12 mg/dL (ref 8–25)
CALCIUM: 8.8 mg/dL (ref 8.5–10.2)
CHLORIDE: 103 mmol/L (ref 96–111)
CO2 TOTAL: 22 mmol/L (ref 22–32)
CO2 TOTAL: 22 mmol/L (ref 22–32)
CREATININE: 0.72 mg/dL (ref 0.62–1.27)
ESTIMATED GFR: >60 mL/min/{1.73_m2} (ref >60)
GLUCOSE: 135 mg/dL (ref 65–139)
POTASSIUM: 3.6 mmol/L (ref 3.5–5.1)
SODIUM: 136 mmol/L (ref 136–145)

## 2018-06-13 LAB — THYROID STIMULATING HORMONE WITH FREE T4 REFLEX: TSH: 0.05 u[IU]/mL — ABNORMAL LOW (ref 0.350–5.000)

## 2018-06-13 LAB — HEPATITIS B SURFACE ANTIGEN
HBV SURFACE ANTIGEN QUALITATIVE: NEGATIVE
HBV SURFACE ANTIGEN QUALITATIVE: NEGATIVE

## 2018-06-13 LAB — THYROXINE, FREE (FREE T4): THYROXINE (T4), FREE: 0.64 ng/dL — ABNORMAL LOW (ref 0.70–1.25)

## 2018-06-13 LAB — HEPATITIS B CORE ANTIBODY: HBV CORE TOTAL ANTIBODIES: REACTIVE — AB

## 2018-06-13 LAB — HEPATITIS C ANTIBODY SCREEN WITH REFLEX TO HCV PCR: HCV ANTIBODY QUALITATIVE: NEGATIVE

## 2018-06-13 LAB — HEPATITIS B SURFACE ANTIBODY: HBV SURFACE ANTIBODY QUANTITATIVE: 359 m[IU]/mL — ABNORMAL HIGH (ref <8)

## 2018-06-13 MED ORDER — LIDOCAINE (PF) 10 MG/ML (1 %) INJECTION SOLUTION
0.5000 mL | Freq: Once | INTRAMUSCULAR | Status: AC
Start: 2018-06-13 — End: 2018-06-13
  Administered 2018-06-13: 0.5 mL via INTRADERMAL

## 2018-06-13 MED ORDER — SODIUM CHLORIDE 0.9 % (FLUSH) INJECTION SYRINGE
10.0000 mL | INJECTION | Freq: Three times a day (TID) | INTRAMUSCULAR | Status: DC
Start: 2018-06-13 — End: 2018-06-17
  Administered 2018-06-13: 0 mL
  Administered 2018-06-13: 10 mL
  Administered 2018-06-14: 30 mL
  Administered 2018-06-14: 20 mL
  Administered 2018-06-14: 10 mL
  Administered 2018-06-15: 30 mL
  Administered 2018-06-15 – 2018-06-16 (×3): 0 mL
  Administered 2018-06-16 (×2): 10 mL
  Administered 2018-06-17 (×2): 0 mL

## 2018-06-13 MED ORDER — SODIUM CHLORIDE 0.9 % (FLUSH) INJECTION SYRINGE
20.0000 mL | INJECTION | INTRAMUSCULAR | Status: DC | PRN
Start: 2018-06-13 — End: 2018-06-17

## 2018-06-13 MED ADMIN — lidocaine (PF) 10 mg/mL (1 %) injection solution: INTRADERMAL | @ 12:00:00

## 2018-06-13 MED ADMIN — oxybutynin chloride 5 mg tablet: @ 21:00:00

## 2018-06-13 NOTE — Care Plan (Signed)
Reviewed plan of care with patient. Reviewed and administered medications. Assessment per flowsheet. Will continue to monitor.     Problem: Adult Inpatient Plan of Care  Goal: Plan of Care Review  Outcome: Ongoing (see interventions/notes)     Problem: Adult Inpatient Plan of Care  Goal: Optimal Comfort and Wellbeing  Outcome: Ongoing (see interventions/notes)     Problem: Fall Injury Risk  Goal: Absence of Fall and Fall-Related Injury  Outcome: Ongoing (see interventions/notes)     Problem: Pain Chronic (Persistent) (Comorbidity Management)  Goal: Acceptable Pain Control and Functional Ability  Outcome: Ongoing (see interventions/notes)

## 2018-06-13 NOTE — Progress Notes (Signed)
[facility]  Medicine Progress Note    [name]  Date of service: 06/13/2018  Date of Admission:  06/12/2018    Hospital Day:  LOS: 1 day   Subjective: Patient seen and examined this am. No acute events overnight. Updated on elevated LFTs. Will discuss with oncology about starting chemo vs waiting for PET scan. Patient reports RUQ pain that started beginning of February when he was diagnosed.     Vital Signs:  Temp  Avg: 36.7 C (98 F)  Min: 36.5 C (97.7 F)  Max: 36.9 C (98.4 F)    Pulse  Avg: 98  Min: 89  Max: 105 BP  Min: 121/76  Max: 142/81   Resp  Avg: 19.3  Min: 18  Max: 20 SpO2  Avg: 93 %  Min: 92 %  Max: 94 %   Pain Score (Numeric, Faces): 4      Input/Output    Intake/Output Summary (Last 24 hours) at 06/13/2018 1151  Last data filed at 06/13/2018 0900  Gross per 24 hour   Intake 720 ml   Output --   Net 720 ml    I/O last shift:  02/22 0700 - 02/22 1859  In: 240 [P.O.:240]  Out: -    aspirin (ECOTRIN) enteric coated tablet 81 mg, 81 mg, Oral, Daily  benzonatate (TESSALON) capsule, 100 mg, Oral, Q8H PRN  buPROPion (WELLBUTRIN SR) sustained release tablet, 150 mg, Oral, 2x/day  dolutegravir (TIVICAY) tablet, 50 mg, Oral, Daily  emtricitabine-tenofovir (DESCOVY) 237m-25mg per tablet, 1 Tab, Oral, Daily  enoxaparin PF (LOVENOX) 40 mg/0.4 mL SubQ injection, 40 mg, Subcutaneous, Q24H  metoprolol tartrate (LOPRESSOR) tablet, 25 mg, Oral, 2x/day  NS flush syringe, 2 mL, Intracatheter, Q8HRS    And  NS flush syringe, 2-6 mL, Intracatheter, Q1 MIN PRN  ondansetron (ZOFRAN) tablet, 4 mg, Oral, Q8H PRN  oxyCODONE (ROXICODONE) immediate release tablet, 5 mg, Oral, Q4H PRN  polyethylene glycol (MIRALAX) oral packet, 17 g, Oral, Daily  promethazine (PHENERGAN) tablet, 25 mg, Oral, Q8H PRN  senna concentrate (SENNA) 5256mper 1578mral liquid, 5 mL, Oral, Daily  sertraline (ZOLOFT) tablet, 100 mg, Oral, Daily  sodium chloride tablet, 1 g, Oral, 3x/day-Meals  SSIP insulin lispro (HUMALOG) 100 units/mL  injection, 0-12 Units, Subcutaneous, 4x/day PRN  zolpidem (AMBIEN) tablet, 10 mg, Oral, HS PRN    Physical Exam:  General: [age] male, well appearing, appears stated age, NAD, vital signs reviewed  HEENT: conjunctiva and sclera clear, PERRL, mucous membranes moist  Respiratory: normal respiratory effort, clear to auscultation bilaterally  Cardiovascular: regular rate and rhythm, S1, S2 normal, no murmur, click, rub or gallop, no pedal edema, pulses 2+ throughout  Gastrointestinal: Soft, RUQ TTP, non-distended, no guarding or rebound, normal bowel sounds x 4 quadrants  Musculoskeletal:  NC/AT, no deformities noted, AROM  Integumentary: skin warm and dry  Neurologic: CN II-XII grossly intact, alert and oriented x3, no tremor present, no asterixis   Psychiatric: normal affect, calm and cooperative    Labs:  CBC Results Differential Results   Recent Results (from the past 30 hour(s))   CBC WITH DIFF    Collection Time: 06/13/18  5:43 AM   Result Value    WBC 7.7    HGB 13.1 (L)    HCT 40.9    PLATELETS 158    Recent Results (from the past 30 hour(s))   CBC WITH DIFF    Collection Time: 06/13/18  5:43 AM   Result Value  WBC 7.7    NEUTROPHIL % 69    MONOCYTE % 10    BASOPHIL % 2    BASOPHIL # 0.13      BMP Results Other Chemistries Results   Results for orders placed or performed during the hospital encounter of 06/12/18 (from the past 30 hour(s))   BASIC METABOLIC PANEL, NON-FASTING    Collection Time: 06/13/18  5:43 AM   Result Value    SODIUM 136    POTASSIUM 3.6    CHLORIDE 103    CO2 TOTAL 22    GLUCOSE 135    BUN 12    CREATININE 0.72    Recent Results (from the past 30 hour(s))   PHOSPHORUS    Collection Time: 06/12/18  5:49 PM   Result Value    PHOSPHORUS 3.0   MAGNESIUM    Collection Time: 06/12/18  5:49 PM   Result Value    MAGNESIUM 1.9   URIC ACID    Collection Time: 06/12/18  5:49 PM   Result Value    URIC ACID 5.8      Liver/Pancreas Enzyme Results Liver Function Results   Recent Results (from the past 30  hour(s))   LDH    Collection Time: 06/12/18  5:49 PM   Result Value    LDH 1,165 (H)   HEPATIC FUNCTION PANEL    Collection Time: 06/12/18  5:49 PM   Result Value    ALKALINE PHOSPHATASE 621 (H)    ALT (SGPT) 221 (H)    AST (SGOT) 210 (H)    Recent Results (from the past 30 hour(s))   HEPATIC FUNCTION PANEL    Collection Time: 06/12/18  5:49 PM   Result Value    ALBUMIN 2.7 (L)    BILIRUBIN TOTAL 2.9 (H)    BILIRUBIN DIRECT 2.4 (H)      Cardiac Results Coags Results   No results found for this or any previous visit (from the past 30 hour(s)). No results found for this or any previous visit (from the past 30 hour(s)).   I have reviewed all lab results from the past 24 hours.     Radiology:       PT/OT: Yes    Consults: heme/onc    Hardware (lines, foley's, tubes): PICC    Assessment/ Plan:   Active Hospital Problems    Diagnosis   . Primary Problem: Small cell lung cancer (CMS HCC)   . Morbid obesity   . HIV (human immunodeficiency virus infection) (CMS HCC)   . CAD (coronary artery disease)   . DM (diabetes mellitus) (CMS HCC)   Ozil Stettler 53 yo male with newly diagnosed small cell lung cancer admitted for inpatient chemotherapy.    Extensive stage small cell lung carcinoma (mets to liver, skull and abdominal lymph nodes)  - dx on 02/10 by liver bx in [place] (records from [facility] under media tab). Moved to [place] with his parents to start treatment.  - s/p palliative XRT to chest  - heme/onc following   - plan to start Carbo/Etoposide ASAP  - PET-CT ordered, likely wont be completed until Monday  - PICC line placed this morning   - patient will get port placed as outpatient  - daily TLS labs (BMP, Mg, P, uric acid, LDH)  - will start ppx Allopurinol 300 mg daily when chemo is started    Transaminitis and hyperbilirubinemia   - likely 2/2 liver mets but MRCP ordered to r/o obstruction  -  per chart review on 06/10/18 tbili 1.7, AST 190, ALT 193, alk phos 595  - on admission tbili 2.9, conj bili 2.4,  AST 210, ALT 221, alk phos 621  - viral hepatitis panel ordered, Hep C negative. Hep B indicative of resolved infection vs immunized, core antibody pending.    Thyroid nodules  - per outside facility note on 2/19 TSH suppressed and free T4 normal  - TSH and free T4 ordered    CAD  - per patient hx of stents and CABG in 2018, no intervention since then   - continue aspirin  - per patient he has been holding Plavix since 2/17 in anticipation of port placement   - continue Lopressor 25 mg BID  - holding pravastatin given transaminitis    DM  - A1C 2/21 7.7  - lispro per sliding scale   - Holding oral antihyperglycemics    HIV  - per patient dx in early 2000s  - continue Descovy/Tivicay  - CD4/CD8 count and viral load pending  - spoke with ID about current antiviral regimen and starting chemotherapy, recommend to continue therapy without adjustments    DVT/PE Prophylaxis: Enoxaparin  Disposition Planning: Home discharge      Cathleen Fears, [place]  06/13/2018 12:03  Pager [phone]      I personally saw and examined the patient. See physician's assistant note for additional details. My findings are:    No complaints.   NAD  Filed Vitals:    06/12/18 2338 06/13/18 0351 06/13/18 0731 06/13/18 1125   BP: 126/81 126/76 121/76 130/83   Pulse: 95 97 (!) 104 89   Resp: '20 20 18 18   ' Temp: 36.8 C (98.2 F) 36.7 C (98.1 F) 36.9 C (98.4 F) 36.5 C (97.7 F)   SpO2: 92% 93% 93% 94%     Newly diagnosed extensive stage small cell lung cancer with skull, liver and abdominal metastasis. Hyperbilirubinemia noted likely from liver mets. Get MRCP to r/o biliary obstruction. PET ordered. Oncology consulted to start IP carboplatin/etoposide.     Delena Bali, MD
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
## 2018-06-13 NOTE — Care Plan (Signed)
pt is in pain, and requesting pain medications as given and ordered to be given.      Problem: Adult Inpatient Plan of Care  Goal: Plan of Care Review  Outcome: Ongoing (see interventions/notes)  Goal: Patient-Specific Goal (Individualized)  Outcome: Ongoing (see interventions/notes)  Goal: Absence of Hospital-Acquired Illness or Injury  Outcome: Ongoing (see interventions/notes)  Goal: Optimal Comfort and Wellbeing  Outcome: Ongoing (see interventions/notes)  Goal: Rounds/Family Conference  Outcome: Ongoing (see interventions/notes)

## 2018-06-13 NOTE — Nurses Notes (Signed)
Patient is alert and oriented X4. Patient reports pain, will administer pain medication when due. Patient reports nausea, PRN Zofran administered. Patient assessment completed per flowsheet. Medications reviewed and administered. Patient is low falls, call bell in reach. Patient denies farther needs at this time.

## 2018-06-13 NOTE — Nurses Notes (Signed)
Patient is alert and oriented x4. Patient denies pain at this time. Assessment per flowsheet. Will continue to monitor.

## 2018-06-13 NOTE — Nurses Notes (Signed)
Assessment per flow sheet. Pt reported minimal pain, declined pain medications.PICC line flushes and draws well. Pt on low fall precautions. Call light within reach. Pt on NPO, awaiting MRI/MRCP.

## 2018-06-13 NOTE — Consults (Signed)
[facility]  Infectious Disease     Name:     [name] Bed:     904/A   Age:        53 y.o. DOB:   November 04, 1965   Gender:  male MRN:  [id number]      Date: 06/13/2018    Brief ID Note:     Thomas Barry is a 53 y.o. w/ hx of HIV and newly diagnosed metastatic small cell lung carcinoma admitted for inpatient chemotherapy.  ID was asked to comment on ART. Per chart, appears to be on a very standard regimen of Descovy/Tivicay. At this time, would recommend following up on pending CD4 count, checking a HIV Viral Load (ordered), and continuing therapy without adjustments.  If patient is virally suppressed (Viral Load "not detected" or <20) and CD4 count is >200 no adjustments/prophylaxis is needed from an HIV standpoint.  It is possible with his underlying malignancy, his CD4 may be falsely low and would assess CD4 percent (a value of  >20% would be very appropriate and require no prophylaxis).         Matilde Sprang, MD  06/13/2018

## 2018-06-14 LAB — CBC WITH DIFF
BASOPHIL #: 0.12 10*3/uL (ref <=0.20)
BASOPHIL %: 2 %
EOSINOPHIL #: <0.1 10*3/uL (ref <=0.50)
EOSINOPHIL %: 0 %
HCT: 42.6 % (ref 38.9–52.0)
HGB: 13.6 g/dL (ref 13.4–17.5)
IMMATURE GRANULOCYTE #: <0.1 10*3/uL (ref <0.10)
IMMATURE GRANULOCYTE %: 1 % (ref 0–1)
LYMPHOCYTE #: 1.17 10*3/uL (ref 1.00–4.80)
LYMPHOCYTE %: 15 %
MCH: 27.5 pg (ref 26.0–32.0)
MCHC: 31.9 g/dL (ref 31.0–35.5)
MCV: 86.2 fL (ref 78.0–100.0)
MONOCYTE #: 0.72 10*3/uL (ref 0.20–1.10)
MONOCYTE %: 9 %
MONOCYTE %: 9 %
MPV: 10.2 fL (ref 8.7–12.5)
NEUTROPHIL #: 5.82 10*3/uL (ref 1.50–7.70)
NEUTROPHIL %: 73 %
PLATELETS: 162 10*3/uL (ref 150–400)
RBC: 4.94 10*6/uL (ref 4.50–6.10)
RDW-CV: 16.7 % — ABNORMAL HIGH (ref 11.5–15.5)
WBC: 7.9 10*3/uL (ref 3.7–11.0)

## 2018-06-14 LAB — BASIC METABOLIC PANEL
ANION GAP: 9 mmol/L (ref 4–13)
BUN/CREA RATIO: 16 (ref 6–22)
BUN: 13 mg/dL (ref 8–25)
CALCIUM: 9 mg/dL (ref 8.5–10.2)
CHLORIDE: 101 mmol/L (ref 96–111)
CHLORIDE: 101 mmol/L (ref 96–111)
CO2 TOTAL: 25 mmol/L (ref 22–32)
CREATININE: 0.79 mg/dL (ref 0.62–1.27)
ESTIMATED GFR: >60 mL/min/{1.73_m2} (ref >60)
GLUCOSE: 157 mg/dL — ABNORMAL HIGH (ref 65–139)
POTASSIUM: 3.9 mmol/L (ref 3.5–5.1)
SODIUM: 135 mmol/L — ABNORMAL LOW (ref 136–145)

## 2018-06-14 LAB — POC BLOOD GLUCOSE (RESULTS)
GLUCOSE, POC: 119 mg/dL — ABNORMAL HIGH (ref 70–105)
GLUCOSE, POC: 135 mg/dL — ABNORMAL HIGH (ref 70–105)
GLUCOSE, POC: 188 mg/dL — ABNORMAL HIGH (ref 70–105)
GLUCOSE, POC: 174 mg/dL — ABNORMAL HIGH (ref 70–105)

## 2018-06-14 LAB — HEPATIC FUNCTION PANEL
ALBUMIN: 2.7 g/dL — ABNORMAL LOW (ref 3.5–5.0)
ALKALINE PHOSPHATASE: 699 U/L — ABNORMAL HIGH (ref 45–115)
ALT (SGPT): 221 U/L — ABNORMAL HIGH (ref <55)
AST (SGOT): 226 U/L — ABNORMAL HIGH (ref 8–48)
BILIRUBIN DIRECT: 3.8 mg/dL — ABNORMAL HIGH (ref <0.3)
BILIRUBIN TOTAL: 4.8 mg/dL — ABNORMAL HIGH (ref 0.3–1.3)
PROTEIN TOTAL: 6.8 g/dL (ref 6.4–8.3)

## 2018-06-14 LAB — MAGNESIUM: MAGNESIUM: 1.8 mg/dL (ref 1.6–2.6)

## 2018-06-14 LAB — PHOSPHORUS: PHOSPHORUS: 2 mg/dL — ABNORMAL LOW (ref 2.4–4.7)

## 2018-06-14 MED ORDER — DIPHENHYDRAMINE 50 MG/ML INJECTION SOLUTION
25.00 mg | Freq: Once | INTRAMUSCULAR | Status: AC | PRN
Start: 2018-06-14 — End: 2018-06-15

## 2018-06-14 MED ORDER — SODIUM CHLORIDE 0.9 % INTRAVENOUS SOLUTION
750.0000 mg | Freq: Once | INTRAVENOUS | Status: AC
Start: 2018-06-14 — End: 2018-06-14
  Administered 2018-06-14: 0 mg via INTRAVENOUS
  Administered 2018-06-14: 750 mg via INTRAVENOUS
  Filled 2018-06-14: qty 75

## 2018-06-14 MED ORDER — SODIUM CHLORIDE 0.9 % INTRAVENOUS SOLUTION
100.00 mg/m2 | INTRAVENOUS | Status: AC
Start: 2018-06-14 — End: 2018-06-16
  Administered 2018-06-14: 0 mg via INTRAVENOUS
  Administered 2018-06-14: 246 mg via INTRAVENOUS
  Administered 2018-06-15: 0 mg via INTRAVENOUS
  Administered 2018-06-15 – 2018-06-16 (×2): 246 mg via INTRAVENOUS
  Administered 2018-06-16: 0 mg via INTRAVENOUS
  Filled 2018-06-14 (×3): qty 12.3

## 2018-06-14 MED ORDER — SODIUM CHLORIDE 0.9 % INTRAVENOUS SOLUTION
16.0000 mg | Freq: Once | INTRAVENOUS | Status: AC
Start: 2018-06-14 — End: 2018-06-14
  Administered 2018-06-14: 0 mg via INTRAVENOUS
  Administered 2018-06-14: 16 mg via INTRAVENOUS
  Filled 2018-06-14: qty 8

## 2018-06-14 MED ORDER — SODIUM CHLORIDE 0.9% FLUSH BAG - 250 ML
INTRAVENOUS | Status: DC | PRN
Start: 2018-06-14 — End: 2018-06-17

## 2018-06-14 MED ORDER — ALBUTEROL SULFATE 2.5 MG/3 ML (0.083 %) SOLUTION FOR NEBULIZATION
2.50 mg | INHALATION_SOLUTION | Freq: Once | RESPIRATORY_TRACT | Status: AC | PRN
Start: 2018-06-14 — End: 2018-06-15

## 2018-06-14 MED ORDER — APREPITANT 130 MG/18 ML (7.2 MG/ML) INTRAVENOUS EMULSION
130.0000 mg | Freq: Once | INTRAVENOUS | Status: AC
Start: 2018-06-14 — End: 2018-06-14
  Administered 2018-06-14: 130 mg via INTRAVENOUS
  Filled 2018-06-14: qty 18

## 2018-06-14 MED ORDER — LORAZEPAM 2 MG/ML INJECTION SOLUTION
0.50 mg | Freq: Four times a day (QID) | INTRAMUSCULAR | Status: DC | PRN
Start: 2018-06-14 — End: 2018-06-17

## 2018-06-14 MED ORDER — PROCHLORPERAZINE EDISYLATE 10 MG/2 ML (5 MG/ML) INJECTION SOLUTION
10.00 mg | Freq: Four times a day (QID) | INTRAMUSCULAR | Status: DC | PRN
Start: 2018-06-14 — End: 2018-06-17

## 2018-06-14 MED ORDER — SODIUM CHLORIDE 0.9 % INTRAVENOUS SOLUTION
12.0000 mg | Freq: Once | INTRAVENOUS | Status: AC
Start: 2018-06-14 — End: 2018-06-14
  Administered 2018-06-14: 12 mg via INTRAVENOUS
  Administered 2018-06-14: 0 mg via INTRAVENOUS
  Filled 2018-06-14: qty 1.2

## 2018-06-14 MED ORDER — MEPERIDINE (PF) 25 MG/ML INJECTION SYRINGE
12.50 mg | INJECTION | Freq: Once | INTRAMUSCULAR | Status: AC | PRN
Start: 2018-06-14 — End: 2018-06-15

## 2018-06-14 MED ORDER — ALBUTEROL SULFATE HFA 90 MCG/ACTUATION AEROSOL INHALER
2.00 | INHALATION_SPRAY | Freq: Once | RESPIRATORY_TRACT | Status: AC | PRN
Start: 2018-06-14 — End: 2018-06-15

## 2018-06-14 MED ORDER — DIPHENHYDRAMINE 50 MG/ML INJECTION SOLUTION
50.00 mg | Freq: Once | INTRAMUSCULAR | Status: AC | PRN
Start: 2018-06-14 — End: 2018-06-15

## 2018-06-14 MED ORDER — HYDROCORTISONE SOD SUCCINATE 100 MG/2 ML VIAL WRAPPER
100.00 mg | Freq: Once | INTRAMUSCULAR | Status: AC | PRN
Start: 2018-06-14 — End: 2018-06-15
  Filled 2018-06-14: qty 2

## 2018-06-14 MED ORDER — GADOBUTROL 15 MMOL/15 ML (1 MMOL/ML) INTRAVENOUS SOLUTION
12.00 mL | INTRAVENOUS | Status: AC
Start: 2018-06-14 — End: 2018-06-14
  Administered 2018-06-14: 01:00:00 12 mL via INTRAVENOUS

## 2018-06-14 MED ORDER — PROCHLORPERAZINE MALEATE 5 MG TABLET
10.00 mg | ORAL_TABLET | Freq: Four times a day (QID) | ORAL | Status: DC | PRN
Start: 2018-06-14 — End: 2018-06-17
  Administered 2018-06-15 – 2018-06-17 (×3): 10 mg via ORAL
  Filled 2018-06-14 (×3): qty 2

## 2018-06-14 MED ORDER — LORAZEPAM 0.5 MG TABLET
0.50 mg | ORAL_TABLET | Freq: Four times a day (QID) | ORAL | Status: DC | PRN
Start: 2018-06-14 — End: 2018-06-17

## 2018-06-14 MED ORDER — EPINEPHRINE 1 MG/ML (1 ML) INJECTION SOLUTION
0.30 mg | Freq: Once | INTRAMUSCULAR | Status: AC | PRN
Start: 2018-06-14 — End: 2018-06-15

## 2018-06-14 MED ORDER — FAMOTIDINE (PF) 20 MG/2 ML INTRAVENOUS SOLUTION
20.00 mg | Freq: Once | INTRAVENOUS | Status: AC | PRN
Start: 2018-06-14 — End: 2018-06-15
  Filled 2018-06-14: qty 2

## 2018-06-14 MED ORDER — DEXTROSE 5% IN WATER (D5W) FLUSH BAG - 250 ML
INTRAVENOUS | Status: DC | PRN
Start: 2018-06-14 — End: 2018-06-17

## 2018-06-14 MED ORDER — DEXAMETHASONE 4 MG TABLET
8.00 mg | ORAL_TABLET | ORAL | Status: DC
Start: 2018-06-15 — End: 2018-06-17
  Administered 2018-06-15 – 2018-06-16 (×2): 8 mg via ORAL
  Filled 2018-06-14 (×2): qty 2

## 2018-06-14 MED ORDER — ALLOPURINOL 300 MG TABLET
300.0000 mg | ORAL_TABLET | Freq: Every day | ORAL | Status: DC
Start: 2018-06-14 — End: 2018-06-17
  Administered 2018-06-14 – 2018-06-17 (×4): 300 mg via ORAL
  Filled 2018-06-14 (×4): qty 1

## 2018-06-14 MED ADMIN — lactated Ringers intravenous solution: INTRAVENOUS | @ 01:00:00 | NDC 00338011704

## 2018-06-14 MED ADMIN — sodium chloride 0.9 % (flush) injection syringe: ORAL | @ 09:00:00

## 2018-06-14 MED ADMIN — dolutegravir 50 mg tablet: ORAL | @ 09:00:00

## 2018-06-14 MED ADMIN — erythromycin 5 mg/gram (0.5 %) eye ointment: INTRAVENOUS | @ 16:00:00 | NDC 17478082401

## 2018-06-14 MED ADMIN — PEDS STARTER PARENTERAL NUTRITION - LESS THAN 1500 GRAMS: @ 16:00:00

## 2018-06-14 MED ADMIN — metroNIDAZOLE 500 mg/100 mL in sodium chlor(iso) intravenous piggyback: @ 16:00:00

## 2018-06-14 MED ADMIN — fentaNYL (PF) 20 mcg/mL in 0.9 % sodium chloride intravenous: ORAL | @ 09:00:00

## 2018-06-14 MED ADMIN — lidocaine 5 % topical patch: ORAL | @ 09:00:00

## 2018-06-14 MED ADMIN — sodium chloride 0.9 % (flush) injection syringe: ORAL | @ 20:00:00

## 2018-06-14 NOTE — Nurses Notes (Signed)
Patient educated on Chemotherapy and signs and symptoms to be aware of if a reaction occurs during the infusion. Patient verbalized understanding. Will continue to monitor.

## 2018-06-14 NOTE — Nurses Notes (Signed)
Chemo precautions maintained. Patient labs monitored and require no replacements at this time. Patient I&Os monitored for signs of fluid overload. Patient premedicated per MAR. Chemo checked and verified with ,Vashti Hey. Chemo infusing through left picc with excellent blood return. Will continue to monitor

## 2018-06-14 NOTE — Nurses Notes (Signed)
Patient is alert and oriented x4. Patient denies n/v at this time. Reviewed and administered medications. Assessment per flowsheet. Will continue to monitor.

## 2018-06-14 NOTE — Progress Notes (Signed)
[facility]  Medicine Progress Note    Thomas Barry  Date of service: 06/14/2018  Date of Admission:  06/12/2018    Hospital Day:  LOS: 2 days   Subjective: Patient seen and examined this am. No acute events overnight. Denies acute complaints. Will start chemo today if obstruction is r/o by MRCP.    Vital Signs:  Temp  Avg: 36.6 C (97.9 F)  Min: 36.5 C (97.7 F)  Max: 36.8 C (98.2 F)    Pulse  Avg: 97  Min: 89  Max: 106 BP  Min: 131/74  Max: 149/89   Resp  Avg: 19.2  Min: 18  Max: 20 SpO2  Avg: 93.6 %  Min: 91 %  Max: 95 %   Pain Score (Numeric, Faces): 7      Input/Output    Intake/Output Summary (Last 24 hours) at 06/14/2018 1126  Last data filed at 06/14/2018 8309  Gross per 24 hour   Intake 1080 ml   Output 525 ml   Net 555 ml    I/O last shift:  02/23 0700 - 02/23 1859  In: 480 [P.O.:480]  Out: 0    aspirin (ECOTRIN) enteric coated tablet 81 mg, 81 mg, Oral, Daily  benzonatate (TESSALON) capsule, 100 mg, Oral, Q8H PRN  buPROPion (WELLBUTRIN SR) sustained release tablet, 150 mg, Oral, 2x/day  dolutegravir (TIVICAY) tablet, 50 mg, Oral, Daily  emtricitabine-tenofovir (DESCOVY) 261m-25mg per tablet, 1 Tab, Oral, Daily  enoxaparin PF (LOVENOX) 40 mg/0.4 mL SubQ injection, 40 mg, Subcutaneous, Q24H  metoprolol tartrate (LOPRESSOR) tablet, 25 mg, Oral, 2x/day  NS flush syringe, 2 mL, Intracatheter, Q8HRS    And  NS flush syringe, 2-6 mL, Intracatheter, Q1 MIN PRN  NS flush syringe, 10-30 mL, Intracatheter, Q8HRS  NS flush syringe, 20-30 mL, Intracatheter, Q1 MIN PRN  ondansetron (ZOFRAN) tablet, 4 mg, Oral, Q8H PRN  oxyCODONE (ROXICODONE) immediate release tablet, 5 mg, Oral, Q4H PRN  polyethylene glycol (MIRALAX) oral packet, 17 g, Oral, Daily  promethazine (PHENERGAN) tablet, 25 mg, Oral, Q8H PRN  senna concentrate (SENNA) 5244mper 15103mral liquid, 5 mL, Oral, Daily  sertraline (ZOLOFT) tablet, 100 mg, Oral, Daily  sodium chloride tablet, 1 g, Oral, 3x/day-Meals  SSIP insulin lispro (HUMALOG) 100  units/mL injection, 0-12 Units, Subcutaneous, 4x/day PRN  zolpidem (AMBIEN) tablet, 10 mg, Oral, HS PRN    Physical Exam:  General: [age] male, well appearing, appears stated age, NAD, vital signs reviewed  HEENT: conjunctiva and sclera clear, PERRL, mucous membranes moist  Respiratory: normal respiratory effort, clear to auscultation bilaterally  Cardiovascular: regular rate and rhythm, S1, S2 normal, no murmur, click, rub or gallop, no pedal edema, pulses 2+ throughout  Gastrointestinal: Soft, RUQ TTP, hepatomegaly, normal bowel sounds x 4 quadrants  Musculoskeletal:  NC/AT, no deformities noted, AROM  Integumentary: jaundiced  Neurologic: CN II-XII grossly intact, alert and oriented x3, no tremor present, no asterixis   Psychiatric: normal affect, calm and cooperative    Labs:  CBC Results Differential Results   Recent Results (from the past 30 hour(s))   CBC WITH DIFF    Collection Time: 06/14/18  4:28 AM   Result Value    WBC 7.9    HGB 13.6    HCT 42.6    PLATELETS 162    Recent Results (from the past 30 hour(s))   CBC WITH DIFF    Collection Time: 06/14/18  4:28 AM   Result Value    WBC 7.9  NEUTROPHIL % 73    MONOCYTE % 9    BASOPHIL % 2    BASOPHIL # 0.12      BMP Results Other Chemistries Results   Results for orders placed or performed during the hospital encounter of 06/12/18 (from the past 30 hour(s))   BASIC METABOLIC PANEL    Collection Time: 06/14/18  4:28 AM   Result Value    SODIUM 135 (L)    POTASSIUM 3.9    CHLORIDE 101    CO2 TOTAL 25    GLUCOSE 157 (H)    BUN 13    CREATININE 0.79    Recent Results (from the past 30 hour(s))   MAGNESIUM    Collection Time: 06/14/18  4:28 AM   Result Value    MAGNESIUM 1.8   PHOSPHORUS    Collection Time: 06/14/18  4:28 AM   Result Value    PHOSPHORUS 2.0 (L)      Liver/Pancreas Enzyme Results Liver Function Results   Recent Results (from the past 30 hour(s))   HEPATIC FUNCTION PANEL    Collection Time: 06/14/18  4:28 AM   Result Value    ALKALINE PHOSPHATASE  699 (H)    ALT (SGPT) 221 (H)    AST (SGOT) 226 (H)    Recent Results (from the past 30 hour(s))   HEPATIC FUNCTION PANEL    Collection Time: 06/14/18  4:28 AM   Result Value    ALBUMIN 2.7 (L)    BILIRUBIN TOTAL 4.8 (H)    BILIRUBIN DIRECT 3.8 (H)      Cardiac Results Coags Results   No results found for this or any previous visit (from the past 30 hour(s)). No results found for this or any previous visit (from the past 30 hour(s)).   I have reviewed all lab results from the past 24 hours.     Radiology:       PT/OT: Yes    Consults: heme/onc    Hardware (lines, foley's, tubes): PICC    Assessment/ Plan:   Active Hospital Problems    Diagnosis   . Primary Problem: Small cell lung cancer (CMS [facility])   . Morbid obesity   . HIV (human immunodeficiency virus infection) (CMS [facility])   . CAD (coronary artery disease)   . DM (diabetes mellitus) (CMS [facility])   Thomas Barry 53 yo male with newly diagnosed small cell lung cancer admitted for inpatient chemotherapy.    Extensive stage small cell lung carcinoma (mets to liver, skull and abdominal lymph nodes)  - dx on 02/10 by liver bx in [place] (records from [name] under media tab). Moved to [place] with his parents to start treatment.  - s/p palliative XRT to chest  - heme/onc following   - plan to start Carbo/Etoposide ASAP, waiting until obstruction is r/o by MRCP  - PET-CT ordered, likely wont be completed until Monday  - PICC line placed   - patient will get port placed as outpatient  - daily TLS labs (BMP, Mg, P, uric acid, LDH)  - will start ppx Allopurinol 300 mg daily when chemo is started    Transaminitis and hyperbilirubinemia   - likely 2/2 liver mets but MRCP ordered to r/o obstruction  - per chart review on 06/10/18 tbili 1.7, AST 190, ALT 193, alk phos 595  - total bili increased overnight from 2.9 to 4.8  - viral hepatitis panel ordered, Hep C negative. Hep B indicative of resolved infection vs immunized,  core antibody pending.    Thyroid nodules   euthyroid sick syndrome  - per outside facility note on 2/19 patient has multiple thyroid nodules. TSH low and free T4 WNL  - 2/22 TSH 0.050 and free T4 0.64  - patient asymptomatic, will refer to endocrinology at discharge    CAD  - per patient hx of stents and CABG in 2018, no intervention since then   - continue aspirin  - per patient he has been holding Plavix since 2/17 in anticipation of port placement   - continue Lopressor 25 mg BID  - holding pravastatin given transaminitis    DM  - A1C 2/21 7.7  - lispro per sliding scale   - Holding oral antihyperglycemics    HIV  - per patient dx in early 2000s  - continue Descovy/Tivicay  - CD4/CD8 count and viral load pending  - spoke with ID about current antiviral regimen and starting chemotherapy, recommend to continue therapy without adjustments    DVT/PE Prophylaxis: Enoxaparin  Disposition Planning: Home discharge  after completion of chemotherapy.     Cathleen Fears, PA-C  06/14/2018 12:03  Pager [phone]    I personally saw and examined the patient. See physician's assistant note for additional details. My findings are:    No new complaints.   NAD  Filed Vitals:    06/13/18 2007 06/14/18 0417 06/14/18 0726 06/14/18 1100   BP: 131/74 139/76 (!) 149/89 137/85   Pulse: 97 (!) 106 (!) 101 89   Resp: '18 20 20 20   ' Temp: 36.8 C (98.2 F) 36.6 C (97.9 F) 36.5 C (97.7 F) 36.5 C (97.7 F)   SpO2: 91% 95% 94% 94%     Extensive Stage SCLC. Oncology consulted to start IP carboplatin/etoposide.     Delena Bali, MD

## 2018-06-14 NOTE — Consults (Signed)
[facility]  Oncology Consult  Follow Up Note    Thomas Barry, Thomas Barry, 53 y.o. male  Date of Service: 06/14/2018  Date of Birth:  12-27-1965    Hospital Day:  LOS: 2 days     Subjective: Overnight, no acute events. This morning, patient denies any acute complaints.     Objective:  Temperature: 36.3 C (97.3 F)  Heart Rate: 93  BP (Non-Invasive): 132/81  Respiratory Rate: 20  SpO2: 95 %  Pain Score (Numeric, Faces): 2  Constitutional:  appears in good health  Eyes:  Conjunctiva clear., Pupils equal and round.   ENT:  ENMT without erythema or injection, mucous membranes moist.  Neck:  no adenopathy  Respiratory:  Clear to auscultation bilaterally.   Cardiovascular:  regular rate and rhythm  Gastrointestinal:  Soft, RUQ tenderness to palpation; hepatomegaly noted  Genitourinary:  Deferred  Musculoskeletal:  Head atraumatic and normocephalic  Integumentary:  Skin warm and dry  Neurologic:  Grossly normal  Lymphatic/Immunologic/Hematologic:  No lymphadenopathy    Labs: Reviewed as per EPIC    Imaging Studies:  Reviewed as per EPIC    Assessment/Recommendations:  Thomas Barry is a 53 year old male with PMH significant for HIV on Descovy/Tivicay, DM2, CAD s/p CABG (2018), and tobacco use who presents as a direct admit from [facility] for newly diagnosed extensive stage small-cell lung cancer and plan to start inpatient chemotherapy.    # Extensive Stage SCLC c/b cholestatic liver injury: patient with worsening hyperbilirubinemia, but MRCP shows extensive liver mets w/o overt CBD or intra-hepatic dilation to suggest obstruction; thus, LFT derangement likely 2/2 to metastatic SCLC and should improve with chemotherapy  - PET CT to complete staging and evaluate for distant mets (plan for 06/15/18)  - will plan to start C1 of Carboplatin and Etoposide inpatient today at full dose.Will add Atezolizumab during 2nd cycle. The patient received extensive education (including written material) about the benefits/ risks involved for  all the agents in the planned chemotherapy regimen. The patient consented to the current treatment plan, and all of his questions were answered to satisfaction.  - agree with TLS monitoring and prophylactic allopurinol  - will continue to follow    Thomas Rotunda, DO  Patient was seen and discussed with attending physician, Dr. Konrad Dolores.    I saw and examined the patient. I reviewed Dr. Melina Fiddler note. I agree with the findings and plan of care as documented in the note. Any exceptions/additions are edited/noted.  I explained to the patient about the hepatotoxic risk of Etoposide. Since his MRCP didn't show specific obstruction that could be improved with GI intervention, his liver dysfunction is most likely secondary to multiple haptic mets. Patient would benefit from full dose Etoposide. He is aware and in agreement. recommend checking his liver function daily and change Etoposide dose (decrease 50%) if needed.    Debbora Lacrosse, M.D.

## 2018-06-14 NOTE — Ancillary Notes (Signed)
[facility]  MRI Technologist Note        MRI has been completed.        Dyke Brackett, [facility] 06/14/2018, 00:53

## 2018-06-14 NOTE — Nurses Notes (Signed)
CARBOplatin  Infusion complete. Patient tolerated infusion with no problems noted. Excellent blood return. Will continue to monitor.

## 2018-06-14 NOTE — Nurses Notes (Signed)
pt lying in bed, wating for chemo to finish, watching TV, family just left, bed low to floor, two rails up, calllight and telephone within reach.

## 2018-06-14 NOTE — Care Plan (Signed)
Pt had one dose of prn oxycodone and no other no issues. Settled in the unit. Had MRI last night. No falls reported, ambulating independently.        Problem: Adult Inpatient Plan of Care  Goal: Plan of Care Review  Outcome: Ongoing (see interventions/notes)  Goal: Patient-Specific Goal (Individualized)  Outcome: Ongoing (see interventions/notes)  Goal: Absence of Hospital-Acquired Illness or Injury  Outcome: Ongoing (see interventions/notes)  Goal: Optimal Comfort and Wellbeing  Outcome: Ongoing (see interventions/notes)  Goal: Rounds/Family Conference  Outcome: Ongoing (see interventions/notes)     Problem: Fall Injury Risk  Goal: Absence of Fall and Fall-Related Injury  Outcome: Ongoing (see interventions/notes)       Problem: Pain Chronic (Persistent) (Comorbidity Management)  Goal: Acceptable Pain Control and Functional Ability  Outcome: Ongoing (see interventions/notes)

## 2018-06-14 NOTE — Nurses Notes (Signed)
Chemo precautions maintained. Patient labs monitored and require no replacements at this time. Patient I&Os monitored for signs of fluid overload. Patient premedicated per MAR. Chemo checked and verified with, Oneida Arenas RN. Chemo infusing through left picc with excellent blood return. Will continue to monitor      CARBOplatin (PARAPLATIN) 750mg  in NS 250  Ml IVPB     Infusion rate 670ml/hr    Volume: 339ml

## 2018-06-15 ENCOUNTER — Encounter: Payer: Self-pay | Admitting: Pharmacist

## 2018-06-15 ENCOUNTER — Other Ambulatory Visit (HOSPITAL_BASED_OUTPATIENT_CLINIC_OR_DEPARTMENT_OTHER): Payer: Self-pay

## 2018-06-15 ENCOUNTER — Inpatient Hospital Stay (INDEPENDENT_AMBULATORY_CARE_PROVIDER_SITE_OTHER): Payer: Self-pay

## 2018-06-15 ENCOUNTER — Other Ambulatory Visit (INDEPENDENT_AMBULATORY_CARE_PROVIDER_SITE_OTHER): Payer: Self-pay | Admitting: Pharmacist

## 2018-06-15 DIAGNOSIS — Z5111 Encounter for antineoplastic chemotherapy: Secondary | ICD-10-CM

## 2018-06-15 LAB — CBC WITH DIFF
BASOPHIL #: <0.1 x10ˆ3/uL (ref <=0.20)
BASOPHIL %: 1 %
EOSINOPHIL #: <0.1 x10ˆ3/uL (ref <=0.50)
EOSINOPHIL %: 0 %
HCT: 39.8 % (ref 38.9–52.0)
HGB: 12.8 g/dL — ABNORMAL LOW (ref 13.4–17.5)
IMMATURE GRANULOCYTE #: <0.1 x10ˆ3/uL (ref <0.10)
IMMATURE GRANULOCYTE %: 1 % (ref 0–1)
LYMPHOCYTE #: 0.81 x10ˆ3/uL — ABNORMAL LOW (ref 1.00–4.80)
LYMPHOCYTE %: 11 %
LYMPHOCYTE %: 11 %
MCH: 27.7 pg (ref 26.0–32.0)
MCHC: 32.2 g/dL (ref 31.0–35.5)
MCV: 86.1 fL (ref 78.0–100.0)
MONOCYTE #: 0.71 x10ˆ3/uL (ref 0.20–1.10)
MONOCYTE %: 9 %
MPV: 10.2 fL (ref 8.7–12.5)
NEUTROPHIL #: 6.11 x10ˆ3/uL (ref 1.50–7.70)
NEUTROPHIL %: 78 %
PLATELETS: 155 10*3/uL (ref 150–400)
RBC: 4.62 x10ˆ6/uL (ref 4.50–6.10)
RDW-CV: 17 % — ABNORMAL HIGH (ref 11.5–15.5)
WBC: 7.7 x10ˆ3/uL (ref 3.7–11.0)

## 2018-06-15 LAB — HEPATIC FUNCTION PANEL
ALBUMIN: 2.5 g/dL — ABNORMAL LOW (ref 3.5–5.0)
ALBUMIN: 2.6 g/dL — ABNORMAL LOW (ref 3.5–5.0)
ALKALINE PHOSPHATASE: 718 U/L — ABNORMAL HIGH (ref 45–115)
ALKALINE PHOSPHATASE: 694 U/L — ABNORMAL HIGH (ref 45–115)
ALT (SGPT): 212 U/L — ABNORMAL HIGH (ref <55)
ALT (SGPT): 216 U/L — ABNORMAL HIGH (ref <55)
AST (SGOT): 210 U/L — ABNORMAL HIGH (ref 8–48)
AST (SGOT): 213 U/L — ABNORMAL HIGH (ref 8–48)
BILIRUBIN DIRECT: 3.6 mg/dL — ABNORMAL HIGH (ref <0.3)
BILIRUBIN DIRECT: 3.9 mg/dL — ABNORMAL HIGH (ref <0.3)
BILIRUBIN DIRECT: 3.9 mg/dL — ABNORMAL HIGH (ref <0.3)
BILIRUBIN TOTAL: 4.6 mg/dL — ABNORMAL HIGH (ref 0.3–1.3)
BILIRUBIN TOTAL: 4.9 mg/dL — ABNORMAL HIGH (ref 0.3–1.3)
PROTEIN TOTAL: 6.5 g/dL (ref 6.4–8.3)
PROTEIN TOTAL: 6.5 g/dL (ref 6.4–8.3)

## 2018-06-15 LAB — CD4/CD8
CD3 # (T CELL): 1107 {cells}/uL (ref 892–2436)
CD3 % (T CELL): 93 %
CD4 # (HELPER T CELL): 173 {cells}/uL — ABNORMAL LOW (ref 382–1614)
CD4 % (HELPER T CELL): 15 %
CD4:CD8 RATIO: 0.2 — ABNORMAL LOW (ref 1.0–3.4)
CD8 # (SUPPRESSOR T CELL): 917 {cells}/uL — ABNORMAL HIGH (ref 157–813)
CD8 % (SUPPRESSOR T CELL): 77 %

## 2018-06-15 LAB — BASIC METABOLIC PANEL
ANION GAP: 10 mmol/L (ref 4–13)
BUN/CREA RATIO: 21 (ref 6–22)
BUN: 14 mg/dL (ref 8–25)
CALCIUM: 8.6 mg/dL (ref 8.5–10.2)
CHLORIDE: 101 mmol/L (ref 96–111)
CO2 TOTAL: 22 mmol/L (ref 22–32)
CREATININE: 0.66 mg/dL (ref 0.62–1.27)
ESTIMATED GFR: >60 mL/min/1.73mˆ2 (ref >60)
GLUCOSE: 186 mg/dL — ABNORMAL HIGH (ref 65–139)
POTASSIUM: 4.2 mmol/L (ref 3.5–5.1)
SODIUM: 133 mmol/L — ABNORMAL LOW (ref 136–145)

## 2018-06-15 LAB — PHOSPHORUS
PHOSPHORUS: 2.5 mg/dL (ref 2.4–4.7)
PHOSPHORUS: 2.5 mg/dL (ref 2.4–4.7)

## 2018-06-15 LAB — POC BLOOD GLUCOSE (RESULTS)
GLUCOSE, POC: 169 mg/dL — ABNORMAL HIGH (ref 70–105)
GLUCOSE, POC: 131 mg/dL — ABNORMAL HIGH (ref 70–105)
GLUCOSE, POC: 175 mg/dL — ABNORMAL HIGH (ref 70–105)
GLUCOSE, POC: 185 mg/dL — ABNORMAL HIGH (ref 70–105)

## 2018-06-15 LAB — URIC ACID: URIC ACID: 4.2 mg/dL (ref 3.5–7.5)

## 2018-06-15 LAB — MAGNESIUM: MAGNESIUM: 1.7 mg/dL (ref 1.6–2.6)

## 2018-06-15 LAB — LDH: LDH: 1189 U/L — ABNORMAL HIGH (ref 125–220)

## 2018-06-15 MED ORDER — PEGFILGRASTIM 6 MG/0.6 ML (DELIVERABLE) WEARABLE SUBCUTANEOUS INJECTOR
6.0000 mg | INJECTION | Freq: Once | SUBCUTANEOUS | 0 refills | Status: AC
Start: 2018-06-15 — End: 2018-06-15

## 2018-06-15 MED ORDER — SULFAMETHOXAZOLE 800 MG-TRIMETHOPRIM 160 MG TABLET
1.0000 | ORAL_TABLET | Freq: Every day | ORAL | Status: DC
Start: 2018-06-15 — End: 2018-06-17
  Administered 2018-06-15 – 2018-06-17 (×3): 160 mg via ORAL
  Filled 2018-06-15 (×3): qty 1

## 2018-06-15 MED ADMIN — senna leaf extract 176 mg/5 mL oral syrup: ORAL | @ 09:00:00

## 2018-06-15 MED ADMIN — oxyCODONE 5 mg tablet: ORAL | @ 08:00:00

## 2018-06-15 MED ADMIN — zolpidem 5 mg tablet: ORAL

## 2018-06-15 MED ADMIN — insulin lispro 100 unit/mL subcutaneous solution: SUBCUTANEOUS | @ 22:00:00

## 2018-06-15 MED ADMIN — oxyCODONE 5 mg tablet: ORAL | @ 19:00:00

## 2018-06-15 MED ADMIN — nystatin 100,000 unit/gram topical powder: @ 05:00:00 | NDC 68308015215

## 2018-06-15 MED ADMIN — lactated Ringers intravenous solution: ORAL | @ 12:00:00

## 2018-06-15 MED ADMIN — potassium chloride 20 mEq/L in dextrose 5 %-0.45 % sodium chloride IV: ORAL | @ 17:00:00 | NDC 00409790209

## 2018-06-15 MED ADMIN — OXYTOCIN 30 UNITS IN 1000ML (LR DEFAULT) INFUSION - MAINTENANCE: ORAL | @ 08:00:00

## 2018-06-15 NOTE — Progress Notes (Signed)
[facility]  Medicine Progress Note    Thomas [place]  Date of service: 06/15/2018  Date of Admission:  06/12/2018    Hospital Day:  LOS: 3 days   Subjective: Patient seen and examined this am. Sitting in chair, looks much better. States he is feeling better. Some nausea this morning. Denies SOB, abdominal pain, emesis, diarrhea.     Vital Signs:  Temp  Avg: 36.4 C (97.6 F)  Min: 36.3 C (97.3 F)  Max: 36.7 C (98.1 F)    Pulse  Avg: 92.2  Min: 89  Max: 97 BP  Min: 132/80  Max: 154/86   Resp  Avg: 19.7  Min: 18  Max: 20 SpO2  Avg: 93.8 %  Min: 93 %  Max: 95 %   Pain Score (Numeric, Faces): 7      Input/Output    Intake/Output Summary (Last 24 hours) at 06/15/2018 1027  Last data filed at 06/15/2018 0415  Gross per 24 hour   Intake 1040 ml   Output --   Net 1040 ml    I/O last shift:  No intake/output data recorded.   albuterol (PROVENTIL) 2.5 mg / 3 mL (0.083%) neb solution, 2.5 mg, Nebulization, Once PRN  albuterol 90 mcg per inhalation oral inhaler - "Respiratory to administer", 2 Puff, Inhalation, Once PRN  allopurinol (ZYLOPRIM) tablet, 300 mg, Oral, Daily  aspirin (ECOTRIN) enteric coated tablet 81 mg, 81 mg, Oral, Daily  benzonatate (TESSALON) capsule, 100 mg, Oral, Q8H PRN  buPROPion (WELLBUTRIN SR) sustained release tablet, 150 mg, Oral, 2x/day  D5W 250 mL flush bag, , Intravenous, Q1H PRN  dexamethasone (DECADRON) tablet, 8 mg, Oral, Q24H  diphenhydrAMINE (BENADRYL) 50 mg/mL injection, 25 mg, Intravenous, Once PRN  diphenhydrAMINE (BENADRYL) 50 mg/mL injection, 50 mg, Intravenous, Once PRN  dolutegravir (TIVICAY) tablet, 50 mg, Oral, Daily  emtricitabine-tenofovir (DESCOVY) 250m-25mg per tablet, 1 Tab, Oral, Daily  enoxaparin PF (LOVENOX) 40 mg/0.4 mL SubQ injection, 40 mg, Subcutaneous, Q24H  EPINEPHrine (ADRENALIN) 1 mg/mL injection, 0.3 mg, Intramuscular, Once PRN  etoposide (TOPOSAR) 246 mg in NS 1,000 mL IVPB, 100 mg/m2 (Treatment Plan Recorded), Intravenous, Q24H  famotidine (PEPCID) 10 mg/mL  injection, 20 mg, Intravenous, Once PRN  hydrocortisone (SOLU-CORTEF) 50 mg/mL injection, 100 mg, Intravenous, Once PRN  LORazepam (ATIVAN) 2 mg/mL injection, 0.5 mg, Intravenous, Q6H PRN  LORazepam (ATIVAN) tablet, 0.5 mg, Oral, Q6H PRN  meperidine (DEMEROL) 25 mg/mL injection, 12.5 mg, Intravenous, Once PRN  metoprolol tartrate (LOPRESSOR) tablet, 25 mg, Oral, 2x/day  NS 250 mL flush bag, , Intravenous, Q1H PRN  NS flush syringe, 2 mL, Intracatheter, Q8HRS    And  NS flush syringe, 2-6 mL, Intracatheter, Q1 MIN PRN  NS flush syringe, 10-30 mL, Intracatheter, Q8HRS  NS flush syringe, 20-30 mL, Intracatheter, Q1 MIN PRN  oxyCODONE (ROXICODONE) immediate release tablet, 5 mg, Oral, Q4H PRN  polyethylene glycol (MIRALAX) oral packet, 17 g, Oral, Daily  prochlorperazine (COMPAZINE) 5 mg/mL injection, 10 mg, Intravenous, Q6H PRN  prochlorperazine (COMPAZINE) tablet, 10 mg, Oral, Q6H PRN  promethazine (PHENERGAN) tablet, 25 mg, Oral, Q8H PRN  senna concentrate (SENNA) 5255mper 1533mral liquid, 5 mL, Oral, Daily  sertraline (ZOLOFT) tablet, 100 mg, Oral, Daily  sodium chloride tablet, 1 g, Oral, 3x/day-Meals  SSIP insulin lispro (HUMALOG) 100 units/mL injection, 0-12 Units, Subcutaneous, 4x/day PRN  zolpidem (AMBIEN) tablet, 10 mg, Oral, HS PRN    Physical Exam:  General: [age] male, well appearing, appears stated age, NAD, vital signs reviewed  HEENT: conjunctiva and sclera clear, PERRL, mucous membranes moist  Respiratory: normal respiratory effort, clear to auscultation bilaterally  Cardiovascular: regular rate and rhythm, S1, S2 normal, no murmur, click, rub or gallop, no pedal edema, pulses 2+ throughout  Gastrointestinal: Soft, RUQ TTP, hepatomegaly, normal bowel sounds x 4 quadrants  Musculoskeletal:  NC/AT, no deformities noted, AROM  Integumentary: jaundiced  Neurologic: CN II-XII grossly intact, alert and oriented x3, no tremor present, no asterixis   Psychiatric: normal affect, calm and  cooperative    Labs:  CBC Results Differential Results   Recent Results (from the past 30 hour(s))   CBC WITH DIFF    Collection Time: 06/15/18  5:01 AM   Result Value    WBC 7.7    HGB 12.8 (L)    HCT 39.8    PLATELETS 155    Recent Results (from the past 30 hour(s))   CBC WITH DIFF    Collection Time: 06/15/18  5:01 AM   Result Value    WBC 7.7    NEUTROPHIL % 78    MONOCYTE % 9    BASOPHIL % 1    BASOPHIL # <0.10      BMP Results Other Chemistries Results   Results for orders placed or performed during the hospital encounter of 06/12/18 (from the past 30 hour(s))   BASIC METABOLIC PANEL    Collection Time: 06/15/18  5:01 AM   Result Value    SODIUM 133 (L)    POTASSIUM 4.2    CHLORIDE 101    CO2 TOTAL 22    GLUCOSE 186 (H)    BUN 14    CREATININE 0.66    Recent Results (from the past 30 hour(s))   URIC ACID    Collection Time: 06/15/18  5:01 AM   Result Value    URIC ACID 4.2   MAGNESIUM    Collection Time: 06/15/18  5:01 AM   Result Value    MAGNESIUM 1.7   PHOSPHORUS    Collection Time: 06/15/18  5:01 AM   Result Value    PHOSPHORUS 2.5      Liver/Pancreas Enzyme Results Liver Function Results   Recent Results (from the past 30 hour(s))   HEPATIC FUNCTION PANEL    Collection Time: 06/15/18  8:08 AM   Result Value    ALKALINE PHOSPHATASE 694 (H)    ALT (SGPT) 216 (H)    AST (SGOT) 213 (H)   HEPATIC FUNCTION PANEL    Collection Time: 06/15/18  5:01 AM   Result Value    ALKALINE PHOSPHATASE 718 (H)    ALT (SGPT) 212 (H)    AST (SGOT) 210 (H)   LDH    Collection Time: 06/15/18  5:01 AM   Result Value    LDH 1,189 (H)   HEPATIC FUNCTION PANEL    Collection Time: 06/14/18  4:28 AM   Result Value    ALKALINE PHOSPHATASE 699 (H)    ALT (SGPT) 221 (H)    AST (SGOT) 226 (H)    Recent Results (from the past 30 hour(s))   HEPATIC FUNCTION PANEL    Collection Time: 06/15/18  8:08 AM   Result Value    ALBUMIN 2.6 (L)    BILIRUBIN TOTAL 4.9 (H)    BILIRUBIN DIRECT 3.9 (H)   HEPATIC FUNCTION PANEL    Collection Time: 06/15/18   5:01 AM   Result Value    ALBUMIN 2.5 (L)    BILIRUBIN TOTAL 4.6 (H)  BILIRUBIN DIRECT 3.6 (H)   HEPATIC FUNCTION PANEL    Collection Time: 06/14/18  4:28 AM   Result Value    ALBUMIN 2.7 (L)    BILIRUBIN TOTAL 4.8 (H)    BILIRUBIN DIRECT 3.8 (H)      Cardiac Results Coags Results   No results found for this or any previous visit (from the past 30 hour(s)). No results found for this or any previous visit (from the past 30 hour(s)).   I have reviewed all lab results from the past 24 hours.     Radiology:        PT/OT: Yes    Consults: heme/onc    Hardware (lines, foley's, tubes): PICC    Assessment/ Plan:   Active Hospital Problems    Diagnosis   . Primary Problem: Small cell lung cancer (CMS HCC)   . Morbid obesity   . HIV (human immunodeficiency virus infection) (CMS HCC)   . CAD (coronary artery disease)   . DM (diabetes mellitus) (CMS HCC)   Thomas Barry 53 yo male with newly diagnosed small cell lung cancer admitted for inpatient chemotherapy.    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 Moved to [place] with his parents to start treatment.  - s/p palliative XRT to chest  - heme/onc following  - C1D2 Carbo/Etoposide   - PET-CT ordered, unable to be done within 2 days of receiving chemo. Will need rescheduled as outpatient   - PICC line placed   - patient will get port placed as outpatient  - daily TLS labs (BMP, Mg, P, uric acid, LDH)  - Allopurinol 300 mg daily   - pharmacy working on on body neulasta, if unable to get patient will have to come back to the cancer center on Thursday 2/27 for neupogen injection     Transaminitis and hyperbilirubinemia   - likely 2/2 multiple liver mets; MRCP did not show obstruction that could be improved with GI intervention  - per chart review on 06/10/18 tbili 1.7, AST 190, ALT 193, alk phos 595  - viral hepatitis panel ordered, Hep C  negative. Hep B indicative of resolved infection vs immunized, core antibody pending.    Thyroid nodules  euthyroid sick syndrome  - per outside facility note on 2/19 patient has multiple thyroid nodules. TSH low and free T4 WNL  - 2/22 TSH 0.050 and free T4 0.64  - patient asymptomatic, will refer to endocrinology at discharge    CAD  - per patient hx of stents and CABG in 2018, no intervention since then   - continue aspirin  - per patient he has been holding Plavix since 2/17 in anticipation of port placement    - patient will need port placement as outpatient so recommend continue holding Plavix for now as we are unsure when this will be scheduled  - continue Lopressor 25 mg BID  - holding pravastatin given transaminitis    DM  - A1C 2/21 7.7  - lispro per sliding scale   - Holding oral antihyperglycemics    HIV  - per patient dx in early 2000s  - continue Descovy/Tivicay  - CD4 low at 173, CD4% 15%  - viral load pending  - spoke with ID about current antiviral regimen and starting chemotherapy, recommend to continue therapy without adjustments. Per ID note on 2/22 "  If patient is virally suppressed (Viral Load "not detected" or <20) and CD4 count is >200 no adjustments/prophylaxis is needed from an HIV standpoint.  It is possible with his underlying malignancy, his CD4 may be falsely low and would assess CD4 percent (a value of  >20% would be very appropriate and require no prophylaxis)."     - d/t CD4 count <200 and CD4% <20% --> started Bactrim for PCP ppx on 2/24    DVT/PE Prophylaxis: Enoxaparin  Disposition Planning: Home discharge  on 2/26 once chemo is completed.     Cathleen Fears, PA-C  06/15/2018 12:03  Pager [phone]    I personally saw and examined the patient. See physician's assistant note for additional details. My findings are:    No new complaints.  He is tolerating chemotherapy pretty well without significant side effects.  NAD  Filed Vitals:    06/14/18 2337 06/15/18 0415 06/15/18 0735 06/15/18  1026   BP: 137/78 132/80 (!) 148/80 (!) 145/84   Pulse: 90 93 91 88   Resp: _0 Temp: 36.7 C (98.1 F) 36.4 C (97.5 F) 36.4 C (97.5 F) 36.2 C (97.2 F)   SpO2: 94% 93% 94% 94%     Patient with newly diagnosed extensive stage small-cell lung cancer.  Admitted in the hospital for the initiation of systemic chemotherapy with carboplatin/etoposide.  Day 2 today. Onc consulted.  Appreciate recommendations.    Delena Bali, MD

## 2018-06-15 NOTE — Consults (Signed)
[facility]  Oncology Consult  Follow Up Note    Thomas Barry, [place], 53 y.o. male  Date of Service: 06/15/2018  Date of Birth:  October 11, 1965    Hospital Day:  LOS: 3 days     Subjective: Overnight, no acute events. This morning, patient denies any acute complaints.     Objective:  Temperature: 36.4 C (97.5 F)  Heart Rate: 93  BP (Non-Invasive): 132/80  Respiratory Rate: 20  SpO2: 93 %  Pain Score (Numeric, Faces): 8  Constitutional:  appears in good health  Eyes:  Conjunctiva clear., Pupils equal and round.   ENT:  ENMT without erythema or injection, mucous membranes moist.  Neck:  no adenopathy  Respiratory:  Clear to auscultation bilaterally.   Cardiovascular:  regular rate and rhythm  Gastrointestinal:  Soft, RUQ tenderness to palpation; hepatomegaly noted  Genitourinary:  Deferred  Musculoskeletal:  Head atraumatic and normocephalic  Integumentary:  Skin warm and dry  Neurologic:  Grossly normal  Lymphatic/Immunologic/Hematologic:  No lymphadenopathy    Labs: Reviewed as per EPIC    Imaging Studies:  Reviewed as per EPIC    Assessment/Recommendations:  Thomas Barry is a 53 year old male with PMH significant for HIV on Descovy/Tivicay, DM2, CAD s/p CABG (2018), and tobacco use who presents as a direct admit from [facility] for newly diagnosed extensive stage small-cell lung cancer and plan to start inpatient chemotherapy.    # Extensive Stage SCLC c/b cholestatic liver injury: patient with worsening hyperbilirubinemia, but MRCP shows extensive liver mets w/o overt CBD or intra-hepatic dilation to suggest obstruction; thus, LFT derangement likely 2/2 to metastatic SCLC and should improve with chemotherapy. LFTs stable today  - PET CT to complete staging and evaluate for distant mets will be done as an outpatient after cycle 1 chemotherapy.  -C1 D2 of Carboplatin and Etoposide.Will add Atezolizumab during 2nd cycle. The patient received extensive education (including written material) about the benefits/ risks  involved for all the agents in the planned chemotherapy regimen. The patient consented to the current treatment plan, and all of his questions were answered to satisfaction.  - continue TLS monitoring and prophylactic allopurinol  - will continue to follow    [name], DO  Patient was seen and discussed with attending physician, Dr. Maretta Bees.      06/15/2018  I saw and examined the patient.  I reviewed the fellow's note.  I agree with the findings and plan of care as documented in the fellow's note.  Any exceptions/additions are edited/noted.    Orma Flaming, MD

## 2018-06-15 NOTE — Care Plan (Signed)
[facility]  Occupational Therapy Initial Evaluation    Patient Name: Thomas Barry  Date of Birth: [birth year]  Height: Height: 175.3 cm (5' 9.02")  Weight: Weight: 124.8 kg (275 lb 2.2 oz)  Room/Bed: 904/A  Payor: AETNA / Plan: AETNA PPO / Product Type: PPO /     Assessment:   Patient tolerated OT evaluation well this date. Patient is mobilizing well and completing basic self care tasks without assistance. Patient is safe to return home when medically appropriate. No OT needs at this time.       Discharge Needs:   Equipment Recommendation: none anticipated    Discharge Disposition: home      Plan:   Current Intervention:      To provide Occupational therapy services Evaluation Only, evaluation only.       The risks/benefits of therapy have been discussed with the patient/caregiver and he/she is in agreement with the established plan of care.       Subjective & Objective        06/15/18 0913   Therapist Pager   OT Assigned/ Pager # [phone]   Rehab Session   Document Type evaluation   Total OT Minutes: 8   Patient Effort excellent   Symptoms Noted During/After Treatment none   General Information   Patient Profile Reviewed yes   Onset of Illness/Injury or Date of Surgery 06/13/18   Pertinent History of Current Functional Problem Travus Oren is a 53 y.o., White male with PMH of HIV, DM, HTN small cell lung cancer diagnosed on 06/01/18 from core needle biopsy of liver mets, completed palliative radiation. Is admitted as a direct from hem/onc Starting chemotherapy.     Medical Lines Telemetry;PICC Line   Respiratory Status room air   Existing Precautions/Restrictions fall precautions;full code   Pre Treatment Status   Pre Treatment Patient Status Patient supine in bed;Call light within reach;Telephone within reach;Sitter select activated   Support Present Pre Treatment  None   Communication Pre Treatment  Nurse   Mutuality/Individual Preferences   Individualized Care Needs OOB with SPV      Patient-Specific Goals (Include Timeframe) go home   Living Environment   Lives With parent(s)   Living Arrangements house   Living Environment Comment Patient states he will be staying with his parents    Functional Level Prior   Ambulation 0 - independent   Transferring 0 - independent   Toileting 0 - independent   Bathing 0 - independent   Dressing 0 - independent   Eating 0 - independent   Prior Functional Level Comment Reports fully independent prior to hospitalization   Self-Care   Usual Activity Tolerance good   Equipment Currently Used at Home no   Vital Signs   O2 Delivery Pre Treatment room air   O2 Delivery Post Treatment room air   Pain Assessment   Pretreatment Pain Rating 0/10 - no pain   Posttreatment Pain Rating 0/10 - no pain   Coping/Psychosocial   Observed Emotional State calm;cooperative   Coping/Psychosocial Response Interventions   Plan Of Care Reviewed With patient   Cognitive Assessment/Interventions   Behavior/Mood Observations behavior appropriate to situation, WNL/WFL   Orientation Status oriented x 4   Attention WNL/WFL   Follows Commands WNL   RUE Assessment   RUE Assessment WFL- Within Functional Limits   LUE Assessment   LUE Assessment WFL- Within Functional Limits   Mobility Assessment/Training   Mobility Comment Patient transitioned from sidelying  to sitting EOB with stand by assist. Patient able to ambulate ~200' with supervision. Patient returned to bedside and returned to supine independently.    Bed Mobility Assessment/Treatment   Supine-Sit Independence independent   Sit to Supine, Independence independent   Transfer Assessment/Treatment   Sit-Stand Independence independent   Stand-Sit Independence independent   Lower Body Dressing Assessment/Training   Position sitting   DRESSING ASSESSED Don Socks;Don Shoes   Independence Level  modified Herbalist   Sitting Balance: Static good balance   Sitting, Dynamic (Balance) good balance   Sit-to-Stand  Balance good balance   Standing Balance: Static good balance   Standing Balance: Dynamic good balance   Post Treatment Status   Post Treatment Patient Status Patient supine in bed;Call light within reach;Telephone within reach   Support Present Post Treatment  None   Communication Post Treatement Nurse   Clinical Impression   Functional Level at Time of Session Patient tolerated OT evaluation well this date. Patient is mobilizing well and completing basic self care tasks without assistance. Patient is safe to return home when medically appropriate. No OT needs at this time.    Criteria for Skilled Therapeutic Interventions Met (OT) no problems identified which require skilled intervention   Therapy Frequency Evaluation Only   Predicted Duration of Therapy evaluation only   Anticipated Equipment Needs at Discharge none anticipated   Anticipated Discharge Disposition home   Highest level of Mobility score   Exercise/Activity Level Performed 7- Walked 25 feet or more       Therapist:   Trellis Moment, OT   Pager #: [phone]

## 2018-06-15 NOTE — Nurses Notes (Signed)
Patient is alert and oriented X4. Patient reports pain, PRN medication administered.  Patient does not reports nausea. Patient assessment completed per flowsheet. Medications reviewed and administered. Patient is low falls, call bell in reach. Patient denies farther needs at this time.

## 2018-06-15 NOTE — Telephone Encounter (Signed)
Prior authorization required for prescription Neulasta OnPro received by [facility] on 06/15/2018.  Benefits investigation to be completed. Soft approval, clinical faxed to [phone] [place] [phone] IB ot Charlsie Merles

## 2018-06-15 NOTE — Nurses Notes (Signed)
Pt receiving chemo etoposide (TOPOSAR) 246 mg in NS 1,000 mL IVPB : Ordered Dose 100 mg/m2  2.45 m2 (Treatment Plan Recorded) : Admin Dose 246 mg : 1,012 mL/hr : Intravenous : EVERY 24 HOURS . Doubled checked with RN Dorrene German. Positive blood return noted prior to infusion. Labs within limits. Will continue to monitor.

## 2018-06-15 NOTE — Care Management Notes (Signed)
[facility] Management Initial Evaluation    Patient Name: Thomas [place]  Date of Birth: Feb 11, 1966  Sex: male  Date/Time of Admission: 06/12/2018  5:18 PM  Room/Bed: 904/A  Payor: Holland Falling / Plan: AETNA PPO / Product Type: PPO /   Primary Care Providers:  Pcp, No (General)    Pharmacy Info:   Preferred Pharmacy     [id number] [address]   [id number]   Phone: [phone]: [phone]   Not a 24 hour pharmacy; exact hours not known.    [id number]   Phone: [phone]: [phone]   Not a 24 hour pharmacy; exact hours not known.        Emergency Contact Info:   Extended Emergency Contact Information  Primary Emergency Contact: [place]  Mobile Phone: [phone] Relation: Sister  Preferred language: English  Interpreter needed? No    History:   CEnrico Eaddyis a 53y.o., male, admitted with small cell lung cancer    Height/Weight: 175.3 cm (5' 9.02") / 124.8 kg (275 lb 2.2 oz)     LOS: 3 days   Ad[facility]    Assessment:      06/15/18 1343   Assessment Details   Assessment Type Admission   Date of Care Management Update 06/15/18   Date of Next DCP Update 06/18/18   Readmission   Is this a readmission? No   Care Management Plan   Discharge Planning Status initial meeting   Projected Discharge Date 06/17/18   Discharge plan discussed with: Patient   CM will evaluate for rehabilitation potential no   Discharge Needs Assessment   Equipment Currently Used at Home none   Equipment Needed After Discharge none   Discharge Facility/Level of Care Needs Home (Patient/Family Member/other)(code 1)   Transportation Available car;family or friend will provide   Referral Information   Admission Type inpatient   Address Verified verified-no changes   Arrived From home or  self-care   Insurance Verified verified-no change   ADVANCE DIRECTIVES   Does the Patient have an Advance Directive? No, Information Offered and Refused   Employment/Financial   Patient has Prescription Coverage?  Yes        Name of Insurance Coverage for Medications Aetna   Financial Concerns none   Living Environment   Select an age group to open "lives with" row.  Adult   Lives With parent(s)   Living Arrangements house   Able to Return to Prior Arrangements yes   Home Safety   Home Assessment: No Problems Identified   Home Accessibility bed and bath on same level;stairs to enter home   Home Main Entrance   Number of Stairs, Main Entrance one   53year old male directt admit for inpatient chemo-dx small cell lung cancer. Plan for chemo, lab monitoring,hem/onc following,PICC line-will receive port as outpatient    Discharge Plan:  Home (Patient/Family Member/other) (code 1)  Met with patient at bedside for initial assessment. [place]". Pt states he is independent in self care and medications. Family will provide transportation at discharge. Pt is not established with a PCP but states he plans  to make appointment in near future. Pt does not have MPOA, lay caregiver addressed by bedside RN. Pt is not active with home health and has no DME at home. [facility]. Anticipate DC to home when ready.    The patient will continue to be evaluated for developing discharge needs.     Case Manager: [place], [place] COORDINATOR  Phone: [phone]

## 2018-06-15 NOTE — Care Plan (Signed)
Met with patient at bedside for initial assessment. Patient states he lives in [place] but will be staying with parents in [place] "for now". Pt states he is independent in self care and medications. Family will provide transportation at discharge. Pt is not established with a PCP but states he plans to make appointment in near future. Pt does not have MPOA, lay caregiver addressed by bedside RN. Pt is not active with home health and has no DME at home. [facility]. Anticipate DC to home when ready.

## 2018-06-15 NOTE — Nurses Notes (Signed)
Pt is A&O x 4 this morning. Complaints of 7/10 pain in abdomen, PRN medication given, see MAR. Sitting up in the chair this morning. Assessment as noted. Will continue to monitor.

## 2018-06-15 NOTE — Care Plan (Signed)
[place]  Physical Therapy Initial Evaluation    Patient Name: Thomas Barry  Date of Birth: 26-Dec-1965  Height: Height: 175.3 cm (5' 9.02")  Weight: Weight: 124.8 kg (275 lb 2.2 oz)  Room/Bed: 904/A  Payor: AETNA / Plan: AETNA PPO / Product Type: PPO /     Assessment:      Thomas Barry tolerated PT evaluation very well on this date, presents with no further acute PT needs at this time. Will complete PT order at this time and thank you for the consult. Anticipate will be able to return home with assist as needed once medically ready for d/c    Discharge Needs:    Equipment Recommendation: none anticipated  Discharge Disposition: home with assist    JUSTIFICATION OF DISCHARGE RECOMMENDATION   Based on current diagnosis, functional performance prior to admission, and current functional performance, this patient requires continued PT services in home with assist in order to achieve significant functional improvements in these deficit areas:  .    Plan:   Current Intervention: other (see comments)(D/C PT)  To provide physical therapy services    for duration of evaluation only.    The risks/benefits of therapy have been discussed with the patient/caregiver and he/she is in agreement with the established plan of care.       Subjective & Objective        06/15/18 0912   Therapist Pager   PT Assigned/ Pager # Larkin Ina [phone]   Rehab Session   Document Type evaluation   Total PT Minutes: 8   Patient Effort excellent   Symptoms Noted During/After Treatment none   General Information   Patient Profile Reviewed yes   Onset of Illness/Injury or Date of Surgery 06/13/18   Pertinent History of Current Functional Problem Thomas Barry is a 53 y.o., White male with PMH of HIV, DM, HTN small cell lung cancer diagnosed on 06/01/18 from core needle biopsy of liver mets, completed palliative radiation. Is admitted as a direct from hem/onc Starting chemotherapy.     Medical Lines Telemetry;PICC Line   Respiratory  Status room air   Existing Precautions/Restrictions fall precautions;full code   Mutuality/Individual Preferences   Individualized Care Needs OOB with SPV   Patient-Specific Goals (Include Timeframe) go home    Living Environment   Lives With parent(s)   Living Arrangements house   Home Assessment: No Problems Identified   Home Accessibility no concerns   Living Environment Comment states he will temporarily be staying with parents as he recovers   Functional Level Prior   Ambulation 0 - independent   Transferring 0 - independent   Toileting 0 - independent   Bathing 0 - independent   Dressing 0 - independent   Eating 0 - independent   Communication 0 - understands/communicates without difficulty   Prior Functional Level Comment reports being fully independent at baseline   Self-Care   Usual Activity Tolerance good   Current Activity Tolerance good   Equipment Currently Used at Home no   Equipment Currently Used at [place] to Hospital none   Pre Treatment Status   Pre Treatment Patient Status Patient supine in bed;Call light within reach;Telephone within reach;Nurse approved session   Support Present Pre Treatment  None   Communication Pre Treatment  Nurse   Cognitive Assessment/Interventions   Behavior/Mood Observations behavior appropriate to situation, WNL/WFL   Orientation Status oriented x 4   Attention WNL/WFL   Follows  Commands WNL   Vital Signs   O2 Delivery Pre Treatment room air   O2 Delivery Post Treatment room air   Pain Assessment   Pretreatment Pain Rating 0/10 - no pain   Posttreatment Pain Rating 0/10 - no pain   RUE Assessment   RUE Assessment WFL- Within Functional Limits   LUE Assessment   LUE Assessment WFL- Within Functional Limits   RLE Assessment   RLE Assessment WFL- Within Functional Limits   LLE Assessment   LLE Assessment WFL- Within Functional Limits   Trunk Assessment   Trunk Assessment WFL-Within Functional Limits   Bed Mobility Assessment/Treatment   Supine-Sit  Independence independent   Sit to Supine, Independence independent   Transfer Assessment/Treatment   Sit-Stand Independence independent   Stand-Sit Independence independent   Gait Assessment/Treatment   Total Distance Ambulated 200   Independence  supervision required   Distance in Feet 200   Gait Speed moderate   Deviations  cadence decreased   Safety Issues  step length decreased   Impairments  endurance   Balance Skill Training   Sitting Balance: Static good balance   Sitting, Dynamic (Balance) good balance   Sit-to-Stand Balance good balance   Standing Balance: Static good balance   Standing Balance: Dynamic good balance   Post Treatment Status   Post Treatment Patient Status Patient supine in bed;Call light within reach;Telephone within reach   Support Present Post Treatment  None   Communication [place] With patient   Basic Mobility Am-PAC/6Clicks Score   Turning in bed without bedrails 4   Lying on back to sitting on edge of flat bed 4   Moving to and from a bed to a chair 4   Standing up from chair 4   Walk in room 4   Climbing 3-5 steps with railing 4   6 Clicks Raw Score total 24   Standardized (t-scale) score 57.68   CMS 0-100% Score 0   CMS Modifier CH   Patient Mobility Goal (JHHLM) 7- Walk 25 feet or more 3X/day   Exercise/Activity Level Performed 7- Walked 25 feet or more   Physical Therapy Clinical Impression   Assessment Thomas Barry tolerated PT evaluation very well on this date, presents with no further acute PT needs at this time. Will complete PT order at this time and thank you for the consult. Anticipate will be able to return home with assist as needed once medically ready for d/c   Patient/Family Goals Statement go home   Criteria for Skilled Therapeutic no;no problems identified which require skilled intervention   Predicted Duration of Therapy Intervention (days/wks) evaluation only   Anticipated Equipment Needs at Discharge (PT) none  anticipated   Anticipated Discharge Disposition home with assist   Evaluation Complexity Justification   Patient History: Co-morbidity/factors that impact Plan of Care 1-2 that impact Plan of Care   Examination Components 4 or more Exam elements addressed   Presentation Stable: Uncomplicated, straight-forward, problem focused   Clinical Decision Making Low complexity   Evaluation Complexity Low complexity   Care Plan Goals   PT Rehab Goals   (PT evaluation only)   Planned Therapy Interventions, PT Eval   Planned Therapy Interventions (PT) other (see comments)  (D/C PT)   Physical Therapy Discharge Summary   Additional Documentation Discharge Summary (PT) (Group)   Discharge Summary, PT Eval   Reason for Discharge no further needs identified  Therapist:   Rexford Maus, PT 09:12  Pager #: [phone]

## 2018-06-15 NOTE — Care Plan (Signed)
pt continues to complain his abdomen is hurting, and at around midnight gets a sleeping pill, glucose readings are  within a lower amount of glucose       Problem: Adult Inpatient Plan of Care  Goal: Plan of Care Review  Outcome: Ongoing (see interventions/notes)  Goal: Patient-Specific Goal (Individualized)  Outcome: Ongoing (see interventions/notes)  Goal: Absence of Hospital-Acquired Illness or Injury  Outcome: Ongoing (see interventions/notes)  Goal: Optimal Comfort and Wellbeing  Outcome: Ongoing (see interventions/notes)  Goal: Rounds/Family Conference  Outcome: Ongoing (see interventions/notes)

## 2018-06-16 ENCOUNTER — Encounter: Payer: Self-pay | Admitting: Hematology and Oncology

## 2018-06-16 ENCOUNTER — Telehealth: Payer: Self-pay

## 2018-06-16 ENCOUNTER — Other Ambulatory Visit: Payer: Self-pay | Admitting: Hematology and Oncology

## 2018-06-16 ENCOUNTER — Other Ambulatory Visit (HOSPITAL_BASED_OUTPATIENT_CLINIC_OR_DEPARTMENT_OTHER): Payer: Self-pay | Admitting: General Practice

## 2018-06-16 LAB — MANUAL DIFF AND MORPHOLOGY-SYSMEX
BASOPHIL #: <0.1 x10ˆ3/uL (ref <=0.20)
BASOPHIL %: 1 %
EOSINOPHIL #: <0.1 x10ˆ3/uL (ref <=0.50)
EOSINOPHIL %: 0 %
LYMPHOCYTE #: 0.53 x10ˆ3/uL — ABNORMAL LOW (ref 1.00–4.80)
LYMPHOCYTE %: 6 %
MONOCYTE #: <0.1 x10ˆ3/uL — ABNORMAL LOW (ref 0.20–1.10)
MONOCYTE %: 1 %
NEUTROPHIL #: 6.83 x10ˆ3/uL (ref 1.50–7.70)
NEUTROPHIL %: 91 %
NRBC FROM MANUAL DIFF: 0 per 100 WBC
RBC MORPHOLOGY: NORMAL
REACTIVE LYMPHOCYTE %: 1 %

## 2018-06-16 LAB — CBC WITH DIFF
HCT: 39.4 % (ref 38.9–52.0)
HGB: 12.9 g/dL — ABNORMAL LOW (ref 13.4–17.5)
MCH: 27.8 pg (ref 26.0–32.0)
MCHC: 32.7 g/dL (ref 31.0–35.5)
MCV: 84.9 fL (ref 78.0–100.0)
MPV: 10.5 fL (ref 8.7–12.5)
PLATELETS: 174 10*3/uL (ref 150–400)
RBC: 4.64 x10ˆ6/uL (ref 4.50–6.10)
RDW-CV: 17.3 % — ABNORMAL HIGH (ref 11.5–15.5)
WBC: 7.5 x10ˆ3/uL (ref 3.7–11.0)

## 2018-06-16 LAB — HEPATIC FUNCTION PANEL
ALBUMIN: 2.5 g/dL — ABNORMAL LOW (ref 3.5–5.0)
ALKALINE PHOSPHATASE: 728 U/L — ABNORMAL HIGH (ref 45–115)
ALT (SGPT): 197 U/L — ABNORMAL HIGH (ref <55)
AST (SGOT): 189 U/L — ABNORMAL HIGH (ref 8–48)
BILIRUBIN DIRECT: 4.6 mg/dL — ABNORMAL HIGH (ref <0.3)
BILIRUBIN TOTAL: 5.8 mg/dL — ABNORMAL HIGH (ref 0.3–1.3)
PROTEIN TOTAL: 6.5 g/dL (ref 6.4–8.3)

## 2018-06-16 LAB — BASIC METABOLIC PANEL
ANION GAP: 10 mmol/L (ref 4–13)
BUN/CREA RATIO: 23 — ABNORMAL HIGH (ref 6–22)
BUN: 15 mg/dL (ref 8–25)
CALCIUM: 8.9 mg/dL (ref 8.5–10.2)
CALCIUM: 8.9 mg/dL (ref 8.5–10.2)
CHLORIDE: 103 mmol/L (ref 96–111)
CO2 TOTAL: 22 mmol/L (ref 22–32)
CREATININE: 0.66 mg/dL (ref 0.62–1.27)
ESTIMATED GFR: >60 mL/min/1.73mˆ2 (ref >60)
GLUCOSE: 134 mg/dL (ref 65–139)
POTASSIUM: 4.2 mmol/L (ref 3.5–5.1)
SODIUM: 135 mmol/L — ABNORMAL LOW (ref 136–145)

## 2018-06-16 LAB — PHOSPHORUS: PHOSPHORUS: 2.5 mg/dL (ref 2.4–4.7)

## 2018-06-16 LAB — LDH: LDH: 1253 U/L — ABNORMAL HIGH (ref 125–220)

## 2018-06-16 LAB — URIC ACID: URIC ACID: 3.9 mg/dL (ref 3.5–7.5)

## 2018-06-16 LAB — POC BLOOD GLUCOSE (RESULTS)
GLUCOSE, POC: 172 mg/dL — ABNORMAL HIGH (ref 70–105)
GLUCOSE, POC: 117 mg/dL — ABNORMAL HIGH (ref 70–105)
GLUCOSE, POC: 134 mg/dL — ABNORMAL HIGH (ref 70–105)
GLUCOSE, POC: 134 mg/dl — ABNORMAL HIGH (ref 70–105)
GLUCOSE, POC: 235 mg/dL — ABNORMAL HIGH (ref 70–105)

## 2018-06-16 LAB — MAGNESIUM: MAGNESIUM: 1.9 mg/dL (ref 1.6–2.6)

## 2018-06-16 MED ADMIN — metoprolol tartrate 25 mg tablet: ORAL | @ 08:00:00

## 2018-06-16 MED ADMIN — dolutegravir 50 mg tablet: ORAL | @ 08:00:00

## 2018-06-16 MED ADMIN — CEFAZOLIN 2G IVPB: @ 06:00:00

## 2018-06-16 NOTE — Telephone Encounter (Signed)
He has started treatment at [facility]. Appts canceled.

## 2018-06-16 NOTE — Telephone Encounter (Signed)
-----   Message from Heath Lark, MD sent at 06/16/2018  8:10 AM EST ----- Regarding: decision When I saw him last week, he told me he might be getting chemo elsewhere. Can you call him today and verify? If so, please cancel all appt and let me know so I can cancel the chemo orders

## 2018-06-16 NOTE — Telephone Encounter (Signed)
Called and left below message ask him to call the office back.

## 2018-06-16 NOTE — Nurses Notes (Signed)
Fingerstick 172 and patient given 3u lispro insulin as per sliding scale.

## 2018-06-16 NOTE — Nurses Notes (Signed)
AML drawn and sent.  Patient medicated for pain

## 2018-06-16 NOTE — Telephone Encounter (Signed)
Official Neulasta approval [id number] patient must fill with [facility] fax Rx to [phone] IB to [employer]

## 2018-06-16 NOTE — Nurses Notes (Signed)
Pt alert and oriented x4. C/o pain rated 2/10. Declines need for pain medication. Assessment per flowsheet. Mod fall and chemo precautions maintained. Call bell within reach. Will continue to monitor.

## 2018-06-16 NOTE — Progress Notes (Signed)
[facility]  Medicine Progress Note    Thomas Barry  Date of service: 06/16/2018  Date of Admission:  06/12/2018    Hospital Day:  LOS: 4 days      Subjective: Patient seen and examined this am, resting comfortably in bed. No complaints overnight. Finishing chemo around 1900 this evening. Discussed f/u plans with oncology. All questions answered     Vital Signs:  Temp  Avg: 36.6 C (97.8 F)  Min: 36.4 C (97.5 F)  Max: 36.7 C (98.1 F)    Pulse  Avg: 90  Min: 86  Max: 92 BP  Min: 139/84  Max: 153/91   Resp  Avg: 17.6  Min: 16  Max: 18 SpO2  Avg: 93.8 %  Min: 92 %  Max: 95 %   Pain Score (Numeric, Faces): 0      Input/Output    Intake/Output Summary (Last 24 hours) at 06/16/2018 1156  Last data filed at 06/15/2018 1800  Gross per 24 hour   Intake 1962 ml   Output --   Net 1962 ml    I/O last shift:  No intake/output data recorded.   allopurinol (ZYLOPRIM) tablet, 300 mg, Oral, Daily  aspirin (ECOTRIN) enteric coated tablet 81 mg, 81 mg, Oral, Daily  benzonatate (TESSALON) capsule, 100 mg, Oral, Q8H PRN  buPROPion (WELLBUTRIN SR) sustained release tablet, 150 mg, Oral, 2x/day  D5W 250 mL flush bag, , Intravenous, Q1H PRN  dexamethasone (DECADRON) tablet, 8 mg, Oral, Q24H  dolutegravir (TIVICAY) tablet, 50 mg, Oral, Daily  emtricitabine-tenofovir (DESCOVY) 200mg -25mg  per tablet, 1 Tab, Oral, Daily  enoxaparin PF (LOVENOX) 40 mg/0.4 mL SubQ injection, 40 mg, Subcutaneous, Q24H  etoposide (TOPOSAR) 246 mg in NS 1,000 mL IVPB, 100 mg/m2 (Treatment Plan Recorded), Intravenous, Q24H  LORazepam (ATIVAN) 2 mg/mL injection, 0.5 mg, Intravenous, Q6H PRN  LORazepam (ATIVAN) tablet, 0.5 mg, Oral, Q6H PRN  metoprolol tartrate (LOPRESSOR) tablet, 25 mg, Oral, 2x/day  NS 250 mL flush bag, , Intravenous, Q1H PRN  NS flush syringe, 2 mL, Intracatheter, Q8HRS    And  NS flush syringe, 2-6 mL, Intracatheter, Q1 MIN PRN  NS flush syringe, 10-30 mL, Intracatheter, Q8HRS  NS flush syringe, 20-30 mL, Intracatheter, Q1 MIN  PRN  oxyCODONE (ROXICODONE) immediate release tablet, 5 mg, Oral, Q4H PRN  polyethylene glycol (MIRALAX) oral packet, 17 g, Oral, Daily  prochlorperazine (COMPAZINE) 5 mg/mL injection, 10 mg, Intravenous, Q6H PRN  prochlorperazine (COMPAZINE) tablet, 10 mg, Oral, Q6H PRN  promethazine (PHENERGAN) tablet, 25 mg, Oral, Q8H PRN  senna concentrate (SENNA) 528mg  per 20mL oral liquid, 5 mL, Oral, Daily  sertraline (ZOLOFT) tablet, 100 mg, Oral, Daily  sodium chloride tablet, 1 g, Oral, 3x/day-Meals  SSIP insulin lispro (HUMALOG) 100 units/mL injection, 0-12 Units, Subcutaneous, 4x/day PRN  trimethoprim-sulfamethoxazole (BACTRIM DS) 160-800mg  per tablet, 1 Tab, Oral, Daily  zolpidem (AMBIEN) tablet, 10 mg, Oral, HS PRN    Physical Exam:  General: 53 yo male, well appearing, appears stated age, NAD, vital signs reviewed  HEENT: conjunctiva and sclera clear, PERRL, mucous membranes moist  Respiratory: normal respiratory effort, clear to auscultation bilaterally  Cardiovascular: regular rate and rhythm, S1, S2 normal, no murmur, click, rub or gallop, no pedal edema, pulses 2+ throughout  Gastrointestinal: Soft, obese, distended, hepatomegaly, normal bowel sounds x 4 quadrants. Nontender  Musculoskeletal:  NC/AT, no deformities noted, AROM  Integumentary: jaundiced  Neurologic: CN II-XII grossly intact, alert and oriented x3, no tremor present, no asterixis   Psychiatric: normal affect, calm  and cooperative    Labs:  CBC Results Differential Results   Recent Results (from the past 30 hour(s))   CBC WITH DIFF    Collection Time: 06/16/18  5:45 AM   Result Value    WBC 7.5    HGB 12.9 (L)    HCT 39.4    PLATELETS 174    Recent Results (from the past 30 hour(s))   CBC WITH DIFF    Collection Time: 06/16/18  5:45 AM   Result Value    WBC 7.5      BMP Results Other Chemistries Results   Results for orders placed or performed during the hospital encounter of 06/12/18 (from the past 30 hour(s))   BASIC METABOLIC PANEL    Collection  Time: 06/16/18  5:45 AM   Result Value    SODIUM 135 (L)    POTASSIUM 4.2    CHLORIDE 103    CO2 TOTAL 22    GLUCOSE 134    BUN 15    CREATININE 0.66    Recent Results (from the past 30 hour(s))   URIC ACID    Collection Time: 06/16/18  5:45 AM   Result Value    URIC ACID 3.9   MAGNESIUM    Collection Time: 06/16/18  5:45 AM   Result Value    MAGNESIUM 1.9   PHOSPHORUS    Collection Time: 06/16/18  5:45 AM   Result Value    PHOSPHORUS 2.5      Liver/Pancreas Enzyme Results Liver Function Results   Recent Results (from the past 30 hour(s))   LDH    Collection Time: 06/16/18  5:45 AM   Result Value    LDH 1,253 (H)   HEPATIC FUNCTION PANEL    Collection Time: 06/16/18  5:45 AM   Result Value    ALKALINE PHOSPHATASE 728 (H)    ALT (SGPT) 197 (H)    AST (SGOT) 189 (H)   HEPATIC FUNCTION PANEL    Collection Time: 06/15/18  8:08 AM   Result Value    ALKALINE PHOSPHATASE 694 (H)    ALT (SGPT) 216 (H)    AST (SGOT) 213 (H)    Recent Results (from the past 30 hour(s))   HEPATIC FUNCTION PANEL    Collection Time: 06/16/18  5:45 AM   Result Value    ALBUMIN 2.5 (L)    BILIRUBIN TOTAL 5.8 (H)    BILIRUBIN DIRECT 4.6 (H)   HEPATIC FUNCTION PANEL    Collection Time: 06/15/18  8:08 AM   Result Value    ALBUMIN 2.6 (L)    BILIRUBIN TOTAL 4.9 (H)    BILIRUBIN DIRECT 3.9 (H)      Cardiac Results Coags Results   No results found for this or any previous visit (from the past 30 hour(s)). No results found for this or any previous visit (from the past 30 hour(s)).   I have reviewed all lab results from the past 24 hours.     Radiology:        PT/OT: Yes    Consults: heme/onc    Hardware (lines, foley's, tubes): PICC    Assessment/ Plan:   Active Hospital Problems    Diagnosis   . Primary Problem: Small cell lung cancer (CMS HCC)   . Morbid obesity   . HIV (human immunodeficiency virus infection) (CMS HCC)   . CAD (coronary artery disease)   . DM (diabetes mellitus) (CMS HCC)   Thomas Barry 53 yo male with  newly diagnosed small cell lung  cancer admitted for inpatient chemotherapy.    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 Moved to [place] with his parents to start treatment.  - s/p palliative XRT to chest  - heme/onc following  - C1D3 Carbo/Etoposide, set to finish this evening around 1900   - PET-CT ordered, unable to be done within 2 days of receiving chemo; order placed to be done OP  - PICC line placed   - patient will get port placed as outpatient  - daily TLS labs (BMP, Mg, P, uric acid, LDH)  - Continue allopurinol 300 mg daily   - pharmacy working on on body neulasta, if unable to get patient will have to come back to the cancer center on Thursday 2/27 for neupogen injection; currently pending insurance issues      Transaminitis and hyperbilirubinemia with Extensive Liver Mets  - likely 2/2 multiple liver mets; MRCP 2/23 did not show obstruction that could be improved with GI intervention  - viral hepatitis panel ordered, Hep C negative. Hep B indicative of resolved infection vs immunized, core antibody pending  - Continue to trend     Thyroid nodules  euthyroid sick syndrome  - per outside facility note on 2/19 patient has multiple thyroid nodules. TSH low and free T4 WNL  - 2/22 TSH 0.050 and free T4 0.64  - patient asymptomatic, will refer to endocrinology at discharge    CAD  - per patient hx of stents and CABG in 2018, no intervention since then   - continue aspirin  - per patient he has been holding Plavix since 2/17 in anticipation of port placement    - patient will need port placement as outpatient so recommend continue holding Plavix for now as we are unsure when this will be scheduled  - continue Lopressor 25 mg BID  - holding pravastatin given transaminitis    DM  - A1C 2/21 7.7  - lispro per sliding scale   - Holding oral antihyperglycemics    HIV  - per patient dx in early  2000s  - continue Descovy/Tivicay  - CD4 low at 173, CD4% 15%  - viral load pending  - spoke with ID about current antiviral regimen and starting chemotherapy, recommend to continue therapy without adjustments. Per ID note on 2/22 " If patient is virally suppressed (Viral Load "not detected" or <20) and CD4 count is >200 no adjustments/prophylaxis is needed from an HIV standpoint.  It is possible with his underlying malignancy, his CD4 may be falsely low and would assess CD4 percent (a value of  >20% would be very appropriate and require no prophylaxis)."     - d/t CD4 count <200 and CD4% <20% --> started Bactrim for PCP ppx on 2/24    DVT/PE Prophylaxis: Enoxaparin  Disposition Planning: Home discharge  following chemo completion and confirmed plan for G-CSF    Marcelle Smiling, PA-C  [place]  Pager # [phone]      I personally saw and examined the patient. See physician's assistant note for additional details. My findings are :    No new complaints.  Tolerating chemotherapy without side effects.  NAD  Filed Vitals:    06/16/18 0119 06/16/18 0534 06/16/18 0734 06/16/18 1053   BP: 139/64 (!) 150/81 139/84 137/87   Pulse: 86  91 90 87   Resp: 18 18 16 16    Temp: 36.4 C (97.5 F) 36.6 C (97.9 F) 36.7 C (98.1 F) 36.4 C (97.5 F)   SpO2: 94% 94% 92% 94%     Admitted with newly diagnosed extensive stage small-cell lung cancer.  Currently getting 1st cycle of carboplatin and etoposide.   Patient will finish chemotherapy today.  Awaiting prior Auth for Neulasta.    Delena Bali, MD

## 2018-06-16 NOTE — Nurses Notes (Signed)
Pt A&Ox4 no complaints this morning. Assessment as noted. PICC line intact. MF and chemo precautions maintained. Will continue to monitor.

## 2018-06-16 NOTE — Transitional Care (Signed)
Pt doesn't need PCP follow-up at this time as pt is here for inpt chemo only.  Discharge date/time along with coordinators phone number added to after visit summary.  Hem/Onc follow-up is scheduled for Wednesday, 3/4.    [place], LPN  3/49/1791, 50:56

## 2018-06-16 NOTE — Ancillary Notes (Signed)
Diabetes Education    Stopped to assess diabetes education needs. Thomas Barry states that he has had diabetes for about three years. The patient states that he takes Metformin and Jardiance. The patient verbalized understanding and compliance with medication regimen. The patient stated that he has a meter and tests glucose one time a day at home. We reviewed alternating testing times. The patient states that he ranges between 120-170 mg/dL.   Current A1c   Lab Results   Component Value Date    HA1C 7.7 (H) 06/12/2018       We reviewed A1C meaning and goal. The patient denies issues with obtaining testing supplies or medications. We reviewed hypoglycemia signs and symptoms, prevention and treatment. We reviewed progression of disease, ADA BG targets and the importance of testing and avoidance of complications by managing blood sugars. The patient shared that he follows with a physician in [place] for diabetes. Answered all questions. Verbalized understanding. Contact information offered.    Thomas Schmid MSN RN   [facility]  P: [phone]  Pager:  [phone]

## 2018-06-16 NOTE — Consults (Signed)
[facility]  Oncology Consult  Follow Up Note    Thomas Barry, Thomas Barry, 53 y.o. male  Date of Service: 06/16/2018  Date of Birth:  1965/05/22    Hospital Day:  LOS: 4 days     Subjective:  No acute events overnight.  Patient states that he is tolerating chemotherapy well.  Denies any symptoms at this time    Objective:  Temperature: 36.3 C (97.3 F)  Heart Rate: 93  BP (Non-Invasive): 136/81  Respiratory Rate: 18  SpO2: 94 %  Pain Score (Numeric, Faces): 0  Constitutional:  appears in good health  Eyes:  Conjunctiva clear., Pupils equal and round.   ENT:  ENMT without erythema or injection, mucous membranes moist.  Neck:  no adenopathy  Respiratory:  Clear to auscultation bilaterally.   Cardiovascular:  regular rate and rhythm  Gastrointestinal:  Soft, RUQ tenderness to palpation; hepatomegaly noted  Genitourinary:  Deferred  Musculoskeletal:  Head atraumatic and normocephalic  Integumentary:  Skin warm and dry  Neurologic:  Grossly normal  Lymphatic/Immunologic/Hematologic:  No lymphadenopathy    Labs: Reviewed as per EPIC    Imaging Studies:  Reviewed as per EPIC    Assessment/Recommendations:  Thomas Barry is a 53 year old male with PMH significant for HIV on Descovy/Tivicay, DM2, CAD s/p CABG (2018), and tobacco use who presents as a direct admit from [facility] for newly diagnosed extensive stage small-cell lung cancer and plan to start inpatient chemotherapy.    # Extensive Stage SCLC c/b cholestatic liver injury: patient with worsening hyperbilirubinemia, but MRCP shows extensive liver mets w/o overt CBD or intra-hepatic dilation to suggest obstruction; thus, LFT derangement likely 2/2 to metastatic SCLC and should improve with chemotherapy. LFTs stable today  - PET CT to complete staging and evaluate for distant mets will be done as an outpatient after cycle 1 chemotherapy.  -C1 D3 of Carboplatin and Etoposide.Will add Atezolizumab during 2nd cycle. The patient received extensive education (including  written material) about the benefits/ risks involved for all the agents in the planned chemotherapy regimen. The patient consented to the current treatment plan, and all of his questions were answered to satisfaction.  - continue TLS monitoring and prophylactic allopurinol  - will continue to follow while inpatient  - Start Neulasta/Neupogen tomorrow.    Adriana Mccallum, MD  [facility]  Internal Medicine, PGY-2  Pager [phone]      I saw and examined the patient.  I reviewed the resident's note.  I agree with the findings and plan of care as documented in the resident's note.  Any exceptions/additions are edited/noted.    Orma Flaming, MD

## 2018-06-16 NOTE — Care Plan (Signed)
Pt getting last dose of chemo tonight. Plan for DC tomorrow and PET scan outpatient. MF and chemo precautions maintained.   Problem: Adult Inpatient Plan of Care  Goal: Plan of Care Review  Outcome: Ongoing (see interventions/notes)  Goal: Patient-Specific Goal (Individualized)  Outcome: Ongoing (see interventions/notes)  Goal: Absence of Hospital-Acquired Illness or Injury  Outcome: Ongoing (see interventions/notes)  Goal: Optimal Comfort and Wellbeing  Outcome: Ongoing (see interventions/notes)  Goal: Rounds/Family Conference  Outcome: Ongoing (see interventions/notes)     Problem: Fall Injury Risk  Goal: Absence of Fall and Fall-Related Injury  Outcome: Ongoing (see interventions/notes)     Problem: Anemia (Chemotherapy Effects)  Goal: Anemia Symptom Improvement  Outcome: Ongoing (see interventions/notes)     Problem: Urinary Bleeding Risk or Actual (Chemotherapy Effects)  Goal: Absence of Hematuria  Outcome: Ongoing (see interventions/notes)     Problem: Nausea and Vomiting (Chemotherapy Effects)  Goal: Fluid and Electrolyte Balance  Outcome: Ongoing (see interventions/notes)     Problem: Neurotoxicity (Chemotherapy Effects)  Goal: Neurotoxicity Symptom Control  Outcome: Ongoing (see interventions/notes)     Problem: Neutropenia (Chemotherapy Effects)  Goal: Absence of Infection  Outcome: Ongoing (see interventions/notes)     Problem: Oral Mucositis (Chemotherapy Effects)  Goal: Improved Oral Mucous Membrane Integrity  Outcome: Ongoing (see interventions/notes)     Problem: Thrombocytopenia Bleeding Risk (Chemotherapy Effects)  Goal: Absence of Bleeding  Outcome: Ongoing (see interventions/notes)     Problem: Pain Chronic (Persistent) (Comorbidity Management)  Goal: Acceptable Pain Control and Functional Ability  Outcome: Ongoing (see interventions/notes)     Problem: Acute Rehab Services Goal & Intervention Plan  Goal: Bathing Goal  Description: Stand Alone Therapy Goal  Outcome: Ongoing (see  interventions/notes)  Goal: Bed Mobility Goal  Description: Stand Alone Therapy Goal  Outcome: Ongoing (see interventions/notes)  Goal: Caregiver Training Goal  Description: Stand Alone Therapy Goal  Outcome: Ongoing (see interventions/notes)  Goal: Cognition Goal  Description: Stand Alone Therapy Goal  Outcome: Ongoing (see interventions/notes)  Goal: Cognition Goals, SLP  Description: Stand Alone Therapy Goal  Outcome: Ongoing (see interventions/notes)  Goal: Communication Goals, SLP  Description: Stand Alone Therapy Goal  Outcome: Ongoing (see interventions/notes)  Goal: Dysphagia Goals, SLP  Description: Stand Alone Therapy Goal  Outcome: Ongoing (see interventions/notes)  Goal: Eating Self-Feeding Goal  Description: Stand Alone Therapy Goal  Outcome: Ongoing (see interventions/notes)  Goal: Gait Training Goal  Description: Stand Alone Therapy Goal  Outcome: Ongoing (see interventions/notes)  Goal: Grooming Goal  Description: Stand Alone Therapy Goal  Outcome: Ongoing (see interventions/notes)  Goal: Home Management Goal  Description: Stand Alone Therapy Goal  Outcome: Ongoing (see interventions/notes)  Goal: Interprofessional Goal  Description: Stand Alone Therapy Goal  Outcome: Ongoing (see interventions/notes)  Goal: LB Dressing Goal  Description: Stand Alone Therapy Goal  Outcome: Ongoing (see interventions/notes)  Goal: Occupational Therapy Goals  Description: Stand Alone Therapy Goal  Outcome: Ongoing (see interventions/notes)  Goal: Physical Therapy Goal  Description: Stand Alone Therapy Goal  Outcome: Ongoing (see interventions/notes)  Goal: Range of Motion Goal  Description: Stand Alone Therapy Goal  Outcome: Ongoing (see interventions/notes)  Goal: Strength Goal  Description: Stand Alone Therapy Goal  Outcome: Ongoing (see interventions/notes)  Goal: Toileting Goal  Description: Stand Alone Therapy Goal  Outcome: Ongoing (see interventions/notes)  Goal: Goal Transfer Training  Description: Stand Alone  Therapy Goal  Outcome: Ongoing (see interventions/notes)  Goal: UB Dressing Goal  Description: Stand Alone Therapy Goal  Outcome: Ongoing (see interventions/notes)

## 2018-06-16 NOTE — Nurses Notes (Signed)
Pt receiving chemo etoposide (TOPOSAR) 246 mg in NS 1,000 mL IVPB : Ordered Dose 100 mg/m2  2.45 m2 (Treatment Plan Recorded) : Admin Dose 246 mg : 1,012 mL/hr : Intravenous : EVERY 24 HOURS double checked with RN Chloe. Labs within limits.

## 2018-06-16 NOTE — Discharge Instructions (Signed)
If you have any questions after you are discharged, but before you see your Primary Care Physician you can contact Tiffine Henigan, Hospitalist Service Coordinator at [phone]    Tamira Ryland, LPN  Hospitalist Service Coordinator

## 2018-06-16 NOTE — Progress Notes (Signed)
MULTIDISCIPLINARY THORACIC TUMOR BOARD DISCUSSION:    PATIENT:   Thomas Barry NUMBER:   [id number]  DOB:    10-14-1965  AGE:   53 y.o.  DATE:   06/16/2018    REFERRING PROVIDER: No ref. provider found  PCP: No Pcp    PRESENTER:  Dorise Hiss, NP/ Merrie Roof, MD  TYPE OF PRESENTATION: Prospective  PATHOLOGY REVIEWED AT [facility]?: Yes, in the process  RADIOGRAPHS REVIEWED AT [facility]?: Yes  [place]?: Yes; NCCN    DIAGNOSIS:   Metastatic SCLC  DIAGNOSIS LOCATION: Outside Facility  DIAGNOSIS METHOD: Biopsy  STAGE: Extensive  RECURRENT?: No    FAMILY HISTORY?: Not Significant, mother and sister with breast cancer, paternal aunt with ovarian cancer, paternal cousin with colon cancer, maternal uncle with rectal cancer, and father, maternal grandfather, and paternal uncle with prostate cancer  GENETIC TESTING?: No  PROGNOSTIC INDICATORS DISCUSSED: Yes    INTERVENTIONS:   SURGICAL/PROCEDURES: Patient had core needle biopsy of liver in February 2020 at an outside facility   CHEMOTHERAPY/IMMUNOTHERAPY:  Patient has completed 1 cycle of Carboplatin/Etoposide in February 2020 at [place]: Patient completed palliative radiation therapy to the chest at an outside facility    CLINICAL TRIAL PARTICIPATION: No  ELIGIBLE CLINICAL TRIALS: No    NEED FOR PALLIATIVE CARE?:  No  PSYCHOSOCIAL CONCERNS?:  No  REHABILITATION CONCERNS?:  No  NUTRITIONAL CONCERNS?:  No    PATIENT NAVIGATOR DISCUSSED OUTREACH ACTIVITIES/PROGRAMS?:  Yes  PATIENT NAVIGATOR PROVIDED EDUCATIONAL MATERIAL TO PATIENT?:  Yes    THE PATIENT'S MOST RECENT CLINICAL INFORMATION, IMAGING, AND PATHOLOGY WAS DISCUSSED TODAY AT [place] MULTIDISCIPLINARY THORACIC TUMOR BOARD BY THE MEDICAL ONCOLOGY, RADIATION ONCOLOGY, SURGERY, RADIOLOGY, PATHOLOGY, PULMONOLOGY, GENETIC, AND NURSING SERVICES.    PRELIMINARY RECOMMENDATIONS BASED ON TODAY'S TUMOR BOARD DISCUSSION:   Continue current chemotherapy, Carboplatin/Etoposide  PET scan    Velda Shell,  06/16/2018 15:25  Oncology Tumor Board Coordinator

## 2018-06-17 ENCOUNTER — Inpatient Hospital Stay: Payer: 59

## 2018-06-17 ENCOUNTER — Encounter (HOSPITAL_COMMUNITY): Payer: Self-pay

## 2018-06-17 DIAGNOSIS — R945 Abnormal results of liver function studies: Secondary | ICD-10-CM

## 2018-06-17 LAB — HEPATIC FUNCTION PANEL
ALBUMIN: 2.5 g/dL — ABNORMAL LOW (ref 3.5–5.0)
ALKALINE PHOSPHATASE: 707 U/L — ABNORMAL HIGH (ref 45–115)
ALT (SGPT): 162 U/L — ABNORMAL HIGH (ref <55)
AST (SGOT): 169 U/L — ABNORMAL HIGH (ref 8–48)
BILIRUBIN DIRECT: 4.4 mg/dL — ABNORMAL HIGH (ref <0.3)
BILIRUBIN TOTAL: 5.6 mg/dL — ABNORMAL HIGH (ref 0.3–1.3)
PROTEIN TOTAL: 6.3 g/dL — ABNORMAL LOW (ref 6.4–8.3)

## 2018-06-17 LAB — BASIC METABOLIC PANEL
ANION GAP: 11 mmol/L (ref 4–13)
BUN/CREA RATIO: 23 — ABNORMAL HIGH (ref 6–22)
BUN: 15 mg/dL (ref 8–25)
CALCIUM: 8.3 mg/dL — ABNORMAL LOW (ref 8.5–10.2)
CHLORIDE: 103 mmol/L (ref 96–111)
CO2 TOTAL: 20 mmol/L — ABNORMAL LOW (ref 22–32)
CREATININE: 0.64 mg/dL (ref 0.62–1.27)
ESTIMATED GFR: >60 mL/min/1.73mˆ2 (ref >60)
GLUCOSE: 148 mg/dL — ABNORMAL HIGH (ref 65–139)
POTASSIUM: 4.2 mmol/L (ref 3.5–5.1)
SODIUM: 134 mmol/L — ABNORMAL LOW (ref 136–145)

## 2018-06-17 LAB — CBC WITH DIFF
BASOPHIL #: <0.1 x10ˆ3/uL (ref <=0.20)
BASOPHIL %: 1 %
EOSINOPHIL #: <0.1 x10ˆ3/uL (ref <=0.50)
EOSINOPHIL %: 0 %
HCT: 37.5 % — ABNORMAL LOW (ref 38.9–52.0)
HGB: 12.2 g/dL — ABNORMAL LOW (ref 13.4–17.5)
IMMATURE GRANULOCYTE #: <0.1 x10ˆ3/uL (ref <0.10)
IMMATURE GRANULOCYTE %: 1 % (ref 0–1)
LYMPHOCYTE #: 0.75 x10ˆ3/uL — ABNORMAL LOW (ref 1.00–4.80)
LYMPHOCYTE %: 13 %
LYMPHOCYTE %: 13 %
MCH: 27.4 pg (ref 26.0–32.0)
MCHC: 32.5 g/dL (ref 31.0–35.5)
MCV: 84.1 fL (ref 78.0–100.0)
MONOCYTE #: 0.15 x10ˆ3/uL — ABNORMAL LOW (ref 0.20–1.10)
MONOCYTE %: 3 %
MPV: 10.2 fL (ref 8.7–12.5)
NEUTROPHIL #: 4.7 x10ˆ3/uL (ref 1.50–7.70)
NEUTROPHIL %: 82 %
PLATELETS: 165 x10ˆ3/uL (ref 150–400)
RBC: 4.46 x10ˆ6/uL — ABNORMAL LOW (ref 4.50–6.10)
RDW-CV: 17.2 % — ABNORMAL HIGH (ref 11.5–15.5)
WBC: 5.7 x10ˆ3/uL (ref 3.7–11.0)

## 2018-06-17 LAB — HIV-1 RNA QUANTITATIVE PCR, PLASMA
HIV1 RNA VIRAL LOAD LOG: 1.71 LOG10 — ABNORMAL HIGH
HIV1 RNA VIRAL LOAD: 51 copies/mL — ABNORMAL HIGH

## 2018-06-17 LAB — POC BLOOD GLUCOSE (RESULTS)
GLUCOSE, POC: 152 mg/dL — ABNORMAL HIGH (ref 70–105)
GLUCOSE, POC: 106 mg/dL — ABNORMAL HIGH (ref 70–105)

## 2018-06-17 LAB — MAGNESIUM: MAGNESIUM: 2.1 mg/dL (ref 1.6–2.6)

## 2018-06-17 MED ORDER — NEOMYCIN-BACITRACN ZN-POLYMYXN 3.5 MG-400 UNIT-5,000 UNIT TOP OINT PKT
1.0000 | TOPICAL_OINTMENT | Freq: Every day | CUTANEOUS | Status: DC | PRN
Start: 2018-06-17 — End: 2018-06-17
  Administered 2018-06-17: 1 via TOPICAL
  Filled 2018-06-17: qty 2

## 2018-06-17 MED ORDER — OXYCODONE 5 MG CAPSULE
5.0000 mg | ORAL_CAPSULE | ORAL | 0 refills | Status: DC | PRN
Start: 2018-06-17 — End: 2018-06-24

## 2018-06-17 MED ORDER — SULFAMETHOXAZOLE 800 MG-TRIMETHOPRIM 160 MG TABLET
1.0000 | ORAL_TABLET | Freq: Every day | ORAL | 2 refills | Status: DC
Start: 2018-06-18 — End: 2018-07-28

## 2018-06-17 MED ORDER — PEGFILGRASTIM 6 MG/0.6 ML (DELIVERABLE) WEARABLE SUBCUTANEOUS INJECTOR
6.0000 mg | INJECTION | Freq: Once | SUBCUTANEOUS | Status: AC
Start: 2018-06-17 — End: 2018-06-17
  Administered 2018-06-17: 6 mg via SUBCUTANEOUS

## 2018-06-17 MED ORDER — NALOXONE 4 MG/ACTUATION NASAL SPRAY
1.0000 | Freq: Once | NASAL | 0 refills | Status: AC
Start: 2018-06-17 — End: 2018-06-17

## 2018-06-17 MED ORDER — ZOLPIDEM 10 MG TABLET: 10 mg | Tab | Freq: Every evening | ORAL | 0 refills | 0 days | Status: AC | PRN

## 2018-06-17 MED ADMIN — prochlorperazine maleate 5 mg tablet: ORAL | @ 07:00:00

## 2018-06-17 MED ADMIN — nystatin 100,000 unit/gram topical powder: ORAL | @ 09:00:00 | NDC 00574200815

## 2018-06-17 MED ADMIN — lactated Ringers intravenous solution: @ 17:00:00 | NDC 00338011704

## 2018-06-17 NOTE — Nurses Notes (Signed)
Pt assessment and vitals per flowsheet. Pt denies V/D but reports nausea and abdominal pain. Prn meds given. PICC flushed without difficulty and blood return present. Fall and chemo precautions maintained. Call bell within reach. Will continue to monitor

## 2018-06-17 NOTE — Discharge Summary (Signed)
[facility]  DISCHARGE SUMMARY    PATIENT NAME:  Thomas Barry, Thomas Barry  MRN:  [id number]  DOB:  [birth year]    ENCOUNTER DATE:  06/12/2018  INPATIENT ADMISSION DATE: 06/12/2018  DISCHARGE DATE:  06/17/2018    ATTENDING PHYSICIAN: No att. providers found  SERVICE: HOSPITALIST 12 ONCOLOGY  PRIMARY CARE PHYSICIAN: No Pcp         LAY CAREGIVER:  ,  ,        PRIMARY DISCHARGE DIAGNOSIS: Small cell lung cancer (CMS [facility])  Active Hospital Problems    Diagnosis Date Noted   . Principle Problem: Small cell lung cancer (CMS HCC) [C34.90] 06/12/2018   . Morbid obesity [E66.01] 06/13/2018   . HIV (human immunodeficiency virus infection) (CMS [place]) [B20] 06/12/2018   . CAD (coronary artery disease) [I25.10] 06/12/2018   . DM (diabetes mellitus) (CMS [place]) [E11.9] 06/12/2018      Resolved Hospital Problems   No resolved problems to display.     There are no active non-hospital problems to display for this patient.       DISCHARGE MEDICATIONS:     Current Discharge Medication List      START taking these medications.      Details   naloxone 4 mg/actuation Spray, Non-Aerosol  Commonly known as:  NARCAN   1 Spray, INTRANASAL, ONCE, As directed for opioid overdose.  Call 911 after administration.  Qty:  1 Each  Refills:  0     trimethoprim-sulfamethoxazole 800-160 mg Tablet  Commonly known as:  BACTRIM DS  Start taking on:  June 18, 2018   160 mg, Oral, DAILY  Qty:  30 Tab  Refills:  2        CONTINUE these medications - NO CHANGES were made during your visit.      Details   aspirin 81 mg Tablet, Delayed Release (E.C.)  Commonly known as:  ECOTRIN   81 mg, Oral, DAILY  Refills:  0     benzonatate 100 mg Capsule  Commonly known as:  TESSALON   100 mg, Oral, 3 TIMES DAILY  Refills:  0     buPROPion 300 mg Tablet Sustained Release 24 hr  Commonly known as:  WELLBUTRIN XL   300 mg, Oral, DAILY  Refills:  0     Descovy 200-25 mg Tablet  Generic drug:  emtricitabine-tenofovir alafen   1 Tab, Oral, DAILY  Refills:  0     dolutegravir 50 mg  Tablet  Commonly known as:  TIVICAY   50 mg, Oral, DAILY  Refills:  0     Jardiance 25 mg Tablet  Generic drug:  empagliflozin   25 mg, Oral, DAILY  Refills:  0     MetFORMIN 1,000 mg Tablet  Commonly known as:  GLUCOPHAGE   1,000 mg, Oral, 2 TIMES DAILY WITH FOOD  Refills:  0     metoprolol tartrate 25 mg Tablet  Commonly known as:  LOPRESSOR   25 mg, Oral, 2 TIMES DAILY  Refills:  0     MIRALAX ORAL   Oral, DAILY  Refills:  0     ondansetron 4 mg Tablet  Commonly known as:  ZOFRAN   4 mg, Oral, EVERY 8 HOURS PRN  Refills:  0     oxyCODONE 5 mg Capsule  Commonly known as:  OXY IR   5 mg, Oral, EVERY 4 HOURS PRN, 1-2 tabs   Qty:  25 Cap  Refills:  0     promethazine  25 mg Tablet  Commonly known as:  PHENERGAN   25 mg, Oral, EVERY 8 HOURS PRN  Refills:  0     Senokot 8.6 mg Tablet  Generic drug:  sennosides   8.6 mg, Oral, DAILY  Refills:  0     sertraline 100 mg Tablet  Commonly known as:  ZOLOFT   100 mg, Oral, DAILY  Refills:  0     sodium chloride 1 gram Tablet   1 g, Oral, 3 TIMES DAILY WITH MEALS, 2 tabs   Refills:  0     zolpidem 10 mg Tablet  Commonly known as:  AMBIEN   10 mg, Oral, NIGHTLY PRN  Qty:  7 Tab  Refills:  0        STOP taking these medications.    clopidogreL 75 mg Tablet  Commonly known as:  PLAVIX     EXCEDRIN MIGRAINE ORAL     pravastatin 40 mg Tablet  Commonly known as:  PRAVACHOL          Discharge med list refreshed?  YES         During this hospitalization did the patient have an AMI, PCI/PCTA, STENT or Isolated CABG?  No              ALLERGIES:  No Known Allergies        HOSPITAL PROCEDURE(S):   Bedside Procedures:  No orders of the defined types were placed in this encounter.    Surgical     REASON FOR HOSPITALIZATION AND HOSPITAL COURSE     BRIEF HOSPITAL NARRATIVE: Thomas Barry is a 53 yo male with newly diagnosed small cell lung cancer, directly admitted from [facility] for C1 inpatient chemotherapy. SCLC dx by liver bx performed in [place], patient recently mover to [place] area with his  parents to begin treatments. Underwent palliative XRT to chest in [place]. Completed C1 carbo/etoposide without complications. Patient noted to have transaminitis and hyperbilirubinemia secondary to extensive mets, noted on MRCP. Imaging also showed some extrinsic compression on prox/mid CBD by nodes with increase in Tbili to 5.6 day of discharge. Case was discussed with GI who stated no indication for ERCP/GI intervention recommended. Viral hepatitis panel negative. Spoke with primary oncologist who requested repeat LFT as OP tomorrow (patient given external lab slip). Also seen by ID while IP d/t hx of HIV, now receiving chemo. CD4 low at 173, CD4% 15%, viral load pending. Started on bactrim for PCP ppx. Referral placed to f/u with ID OP to establish care. Neulasta on body placed day of discharge. PET CT to be completed as OP. Patient provided with 1 week supply of oxycodone 5mg  and Ambien to use nightly until f/u with supportive care on 3/4. He was educated on the risks associated with concomitant use, narcan rx. Patient returned home with family via private vehicle.      TRANSITION/POST DISCHARGE CARE/PENDING TESTS/REFERRALS:   - F/u with Dr Carolynn Serve and Supportive Care on 3/4; same day labs at Day CT order placed   - Will need port placement OP  - LFTs tomorrow, faxed to Dr Carolynn Serve   - Referral to [facility] ID to establish care  - Referred to [facility] as patient's PCP out of network and patient with great coordination of care needs       CONDITION ON DISCHARGE:  A. Ambulation: Full ambulation  B. Self-care Ability: Complete  C. Cognitive Status Alert and Oriented x 3  D. Code status at discharge:  Code Status Information     Code Status    Full Code                 LINES/DRAINS/WOUNDS AT DISCHARGE:   Patient Lines/Drains/Airways Status    Active Line / Dialysis Catheter / Dialysis Graft / Drain / Airway / Wound     None                DISCHARGE DISPOSITION:  Home discharge              DISCHARGE  INSTRUCTIONS:  [facility], [facility] .    Specialty:  Hematology and Oncology  Contact information:  [place]  Additional information:  The [facility], also known as [facility]) is located on the [facility] in [place], [place]. Below are driving directions, however, if you need additional information, please call [phone]-[place]-CARE [phone]) or you may visit our website at [email or web address].*From [place] or South:*Take exit #155 toward [place]. Continue on [address] Make a left onto [address] ([place] [address]) at the [place]. At fourth traffic light, make right onto [address]. At the traffic circle, keep right, towards [facility]. *From [place] or South:*Take Exit #7, [address]. Follow signs for [place] and [place]. Continue up the hill and drive past the airport (will be on your left), bear right at the roundabout onto [place], following [address] and turn left onto [address]. Turn right at the next traffic light, [address], Continue past [facility] and follow sign to the [place].*The [facility] entrance is located on the right side after the guard booth and parking garage. Please enter the facility under the canopy directly behind the flagpoles. Valet parking is available to patients at [place] outpatient clinics free of charge and tipping is not required. Please pull under canopy to take advantage of this service.           [place] .    Specialty:  Oncology  Contact information:  [place] [address]  [phone]  Additional information:  The [facility] [place], also known as [facility]) is located on the [facility] in [place], [place]. Below are driving directions, however, if you need additional information, please call 1-855-[place]-CARE [phone]) or you may visit our website at [email or web address].*From [place] or South:*Take exit #155 toward [place]. Continue on [address]. Make a left onto [address] ([place] [address]) at the [place]. At fourth traffic light, make right onto [address]. At the traffic circle, keep right, towards [facility]. *From [place] or South:*Take Exit #7, [address]. Follow signs for [place] and [place]. Continue up the hill and drive past the airport (will be on your left), bear right at the roundabout onto [place], following [address] and turn left onto [address]. Turn right at the next traffic light, [address], Continue past [facility] and follow sign to the [place].*The [facility] entrance is located on the right side after the guard booth and parking garage. Please enter the facility under the canopy directly behind the flagpoles. Valet parking is available to patients at [facility]  Medicine outpatient clinics free of charge and tipping is not required. Please pull under canopy to take advantage of this service.           [place] .    Specialty:  Oncology  Contact information:  [place] [address]  [phone]  Additional information:  The [facility], also known as [facility]) is located on the [facility] in [place], [place]. Below are driving directions, however, if you need additional information, please call 1-855-[place]-CARE [phone]) or you may visit our website at [email or web address].*From [place] or South:*Take exit #155 toward [place]. Continue on [address]. Make a left onto [employer] [address]  ([place] [address]) at the [place]. At fourth traffic light, make right onto [address]. At the traffic circle, keep right, towards [facility]. *From [place] or South:*Take Exit #7, [address]. Follow signs for [place] and [place]. Continue up the hill and drive past the airport (will be on your left), bear right at the roundabout onto [place], following [place] [address] and turn left onto [address]. Turn right at the next traffic light, [address], Continue past [facility] and follow sign to the [place].*The [facility] entrance is located on the right side after the guard booth and parking garage. Please enter the facility under the canopy directly behind the flagpoles. Valet parking is available to patients at [place] outpatient clinics free of charge and tipping is not required. Please pull under canopy to take advantage of this service.           [facility], [facility] .    Specialty:  Internal Medicine  Contact information:  [address]  [place] [address]  [phone]  Additional information:  Below are driving directions to the Internal Medicine Clinic located in [place], [place].  If you need any additional information, pleaes call 1-855-[place]-CARE or [phone].  You may also visit our website at [email or web address].*Valet parking is available to patients at [place] for free and tipping is not required.*Vistors to our main campus will Location manager as we are expanding to better serve you.  We apologize for any inconvenience this may cause and appreciate your patience.                   PET NI:DPOE (HEAD TO THIGH) W IV CONTRAST    It is standard of practice that a diagnostic quality CT will be performed with every PET/CT scan.      Reason for Exam staging work up for small cell lung cancer    What is the patient's weight in lbs? 124.8 kg (275 lb 2.2 oz)  (06/15/2018)    Does the patient agree to having the test performed internally at [facility]? External sites include [facility] and [facility] yes      HEPATIC FUNCTION PANEL    Fax results to [facility] attn Dr Carolynn Serve [phone]     Refer to [facility] Infectious Disease   Referral Type: Physician Referral-Office Visits   Number of Visits Requested: 1     [place] PROVIDER     Follow-up in: [place]    Reason for visit RETURN VISIT    Coordination with other outpt. appts.? YES (provide details in comment section & place separate orders for those items)  supportive care     SCHEDULE FOLLOW-UP SUPPORTIVE CARE (ONCOLOGY PATIENTS ONLY) - [place]     Follow-up in: 1 WEEK    Provider First available    Reason for referral: chronic abd pain      SCHEDULE FOLLOW-UP SUPPORTIVE CARE (ONCOLOGY PATIENTS ONLY) - [place]    Same day as [place] appt please (3/4)     Follow-up in: 1 WEEK    Provider First available    Reason for referral: pain      [place]     Reason for visit: HOSPITAL DISCHARGE    Follow-up in: 3 to 7 DAYS    Follow-up reason: hospital follow up, establish care, PCP out of network, newly dx [place], [place]  Pager # [phone]      Copies sent to Care Team       Relationship Specialty Notifications Start End    Pcp, No PCP - General   06/12/18     Swager, Frederic Jericho, RN Registered Nurse [place], [place] Admissions 06/12/18             Referring providers can utilize [email or web address] to access their referred [place] patient's information.

## 2018-06-17 NOTE — Progress Notes (Signed)
[facility]  Medicine Progress Note    Thomas Barry  Date of service: 06/17/2018  Date of Admission:  06/12/2018    Hospital Day:  LOS: 5 days      Subjective: Patient seen and examined this am. C/o chronic pain in RUQ and some nausea. Tolerating regular diet. Discussed d/c planning with patient     Vital Signs:  Temp  Avg: 36.4 C (97.5 F)  Min: 36.3 C (97.3 F)  Max: 36.5 C (97.7 F)    Pulse  Avg: 92  Min: 82  Max: 100 BP  Min: 136/81  Max: 158/90   Resp  Avg: 17.3  Min: 16  Max: 18 SpO2  Avg: 94 %  Min: 93 %  Max: 95 %   Pain Score (Numeric, Faces): 7      Input/Output    Intake/Output Summary (Last 24 hours) at 06/17/2018 1300  Last data filed at 06/17/2018 0834  Gross per 24 hour   Intake 1882 ml   Output --   Net 1882 ml    I/O last shift:  02/26 0700 - 02/26 1859  In: 28 [P.O.:60]  Out: -    allopurinol (ZYLOPRIM) tablet, 300 mg, Oral, Daily  aspirin (ECOTRIN) enteric coated tablet 81 mg, 81 mg, Oral, Daily  benzonatate (TESSALON) capsule, 100 mg, Oral, Q8H PRN  buPROPion (WELLBUTRIN SR) sustained release tablet, 150 mg, Oral, 2x/day  D5W 250 mL flush bag, , Intravenous, Q1H PRN  dexamethasone (DECADRON) tablet, 8 mg, Oral, Q24H  dolutegravir (TIVICAY) tablet, 50 mg, Oral, Daily  emtricitabine-tenofovir (DESCOVY) 200mg -25mg  per tablet, 1 Tab, Oral, Daily  enoxaparin PF (LOVENOX) 40 mg/0.4 mL SubQ injection, 40 mg, Subcutaneous, Q24H  LORazepam (ATIVAN) 2 mg/mL injection, 0.5 mg, Intravenous, Q6H PRN  LORazepam (ATIVAN) tablet, 0.5 mg, Oral, Q6H PRN  metoprolol tartrate (LOPRESSOR) tablet, 25 mg, Oral, 2x/day  NS 250 mL flush bag, , Intravenous, Q1H PRN  NS flush syringe, 2 mL, Intracatheter, Q8HRS    And  NS flush syringe, 2-6 mL, Intracatheter, Q1 MIN PRN  NS flush syringe, 10-30 mL, Intracatheter, Q8HRS  NS flush syringe, 20-30 mL, Intracatheter, Q1 MIN PRN  oxyCODONE (ROXICODONE) immediate release tablet, 5 mg, Oral, Q4H PRN  pegfilgrastim (NEULASTA ONPRO) 6 mg/0.6 mL SQ - ON BODY injector, 6 mg,  Subcutaneous, Once  polyethylene glycol (MIRALAX) oral packet, 17 g, Oral, Daily  prochlorperazine (COMPAZINE) 5 mg/mL injection, 10 mg, Intravenous, Q6H PRN  prochlorperazine (COMPAZINE) tablet, 10 mg, Oral, Q6H PRN  promethazine (PHENERGAN) tablet, 25 mg, Oral, Q8H PRN  senna concentrate (SENNA) 528mg  per 28mL oral liquid, 5 mL, Oral, Daily  sertraline (ZOLOFT) tablet, 100 mg, Oral, Daily  sodium chloride tablet, 1 g, Oral, 3x/day-Meals  SSIP insulin lispro (HUMALOG) 100 units/mL injection, 0-12 Units, Subcutaneous, 4x/day PRN  trimethoprim-sulfamethoxazole (BACTRIM DS) 160-800mg  per tablet, 1 Tab, Oral, Daily  zolpidem (AMBIEN) tablet, 10 mg, Oral, HS PRN    Physical Exam:  General: 53 yo male, well appearing, appears stated age, NAD, vital signs reviewed  HEENT: scleral icterus, PERRL, mucous membranes moist  Respiratory: normal respiratory effort, clear to auscultation bilaterally  Cardiovascular: regular rate and rhythm, S1, S2 normal, no murmur, click, rub or gallop, no pedal edema, pulses 2+ throughout  Gastrointestinal: Soft, obese, distended, hepatomegaly, normal bowel sounds x 4 quadrants. Nontender  Musculoskeletal:  NC/AT, no deformities noted, AROM  Integumentary: jaundiced  Neurologic: CN II-XII grossly intact, alert and oriented x3, no tremor present, no asterixis   Psychiatric: normal affect,  calm and cooperative    Labs:  CBC Results Differential Results   Recent Results (from the past 30 hour(s))   CBC WITH DIFF    Collection Time: 06/17/18  5:33 AM   Result Value    WBC 5.7    HGB 12.2 (L)    HCT 37.5 (L)    PLATELETS 165    Recent Results (from the past 30 hour(s))   CBC WITH DIFF    Collection Time: 06/17/18  5:33 AM   Result Value    WBC 5.7    NEUTROPHIL % 82    MONOCYTE % 3    BASOPHIL % 1    BASOPHIL # <0.10      BMP Results Other Chemistries Results   Results for orders placed or performed during the hospital encounter of 06/12/18 (from the past 30 hour(s))   BASIC METABOLIC PANEL     Collection Time: 06/17/18  5:33 AM   Result Value    SODIUM 134 (L)    POTASSIUM 4.2    CHLORIDE 103    CO2 TOTAL 20 (L)    GLUCOSE 148 (H)    BUN 15    CREATININE 0.64    Recent Results (from the past 30 hour(s))   MAGNESIUM    Collection Time: 06/17/18  5:33 AM   Result Value    MAGNESIUM 2.1      Liver/Pancreas Enzyme Results Liver Function Results   Recent Results (from the past 30 hour(s))   HEPATIC FUNCTION PANEL    Collection Time: 06/17/18  5:33 AM   Result Value    ALKALINE PHOSPHATASE 707 (H)    ALT (SGPT) 162 (H)    AST (SGOT) 169 (H)    Recent Results (from the past 30 hour(s))   HEPATIC FUNCTION PANEL    Collection Time: 06/17/18  5:33 AM   Result Value    ALBUMIN 2.5 (L)    BILIRUBIN TOTAL 5.6 (H)    BILIRUBIN DIRECT 4.4 (H)      Cardiac Results Coags Results   No results found for this or any previous visit (from the past 30 hour(s)). No results found for this or any previous visit (from the past 30 hour(s)).   I have reviewed all lab results from the past 24 hours.     Radiology:  I have reviewed all radiology studies.    PT/OT: Yes    Consults: heme/onc    Hardware (lines, foley's, tubes): PICC    Assessment/ Plan:   Active Hospital Problems    Diagnosis   . Primary Problem: Small cell lung cancer (CMS HCC)   . Morbid obesity   . HIV (human immunodeficiency virus infection) (CMS HCC)   . CAD (coronary artery disease)   . DM (diabetes mellitus) (CMS HCC)   Thomas Barry 53 yo male with newly diagnosed small cell lung cancer admitted for inpatient chemotherapy.    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 Moved to [place] with his parents to start treatment.  - s/p palliative XRT to chest  - heme/onc following  - C1 Carbo/Etoposide completed   - PET-CT ordered, unable to be done within 2 days of receiving chemo; order placed to be done OP  - PICC line placed   -  patient will get port placed as outpatient  - daily TLS  labs (BMP, Mg, P, uric acid, LDH)  - Continue allopurinol 300 mg daily   - Neulasta on body placed prior to d/c    Transaminitis and hyperbilirubinemia with Extensive Liver Mets  - likely 2/2 multiple liver mets; MRCP 2/23 showed some extrinsic compression on prox/mid CBD by nodes  - Tbili.conj bili continuing to rise, 5.6 today   - Discussed with GI, no indication for ERCP/GI intervention recommended, believed to be d/t metastatic dz  - viral hepatitis panel ordered, Hep C negative. Hep B indicative of resolved infection vs immunized, core antibody pending  - Discussed with Dr Tommy Medal; requested repeat LFT tomorrow    Thyroid nodules  euthyroid sick syndrome  - per outside facility note on 2/19 patient has multiple thyroid nodules. TSH low and free T4 WNL  - 2/22 TSH 0.050 and free T4 0.64  - patient asymptomatic, will refer to endocrinology at discharge    CAD  - per patient hx of stents and CABG in 2018, no intervention since then   - continue aspirin  - per patient he has been holding Plavix since 2/17 in anticipation of port placement    - patient will need port placement as outpatient so recommend continue holding Plavix for now as we are unsure when this will be scheduled   - PICC removed prior to d/c   - continue Lopressor 25 mg BID  - holding pravastatin given transaminitis    DM  - A1C 2/21 7.7  - lispro per sliding scale   - Holding oral antihyperglycemics    HIV  - per patient dx in early 2000s  - continue Descovy/Tivicay  - CD4 low at 173, CD4% 15%  - viral load pending  - spoke with ID about current antiviral regimen and starting chemotherapy, recommend to continue therapy without adjustments. Per ID note on 2/22 " If patient is virally suppressed (Viral Load "not detected" or <20) and CD4 count is >200 no adjustments/prophylaxis is needed from an HIV standpoint.  It is possible with his underlying malignancy, his CD4 may be falsely low and would  assess CD4 percent (a value of  >20% would be very appropriate and require no prophylaxis)."     - d/t CD4 count <200 and CD4% <20% --> started Bactrim for PCP ppx on 2/24    DVT/PE Prophylaxis: Enoxaparin  Disposition Planning: Home discharge      Greater than 30 minutes was spent on the discharge process including patient education, medication reconciliation, and transition of care.     Marcelle Smiling, [place]  Pager # [phone]    Patient was seen and examined during collaborative rounding with Marcelle Smiling, PA-C.  The case was discussed in detail as above.  I agree with physical examination as noted.      Philemon Kingdom, MD

## 2018-06-17 NOTE — Nurses Notes (Signed)
Patient discharged home via private transport. Discharge papers explained to patient. Neulasta Onpro in place. Pt educated and verbalizes understanding. Scripts faxed to discharge pharmacy and patient states he will pick up on the way out. No questions at this time. Pt left floor in stable condition
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
## 2018-06-17 NOTE — Care Management Notes (Signed)
[facility]  Care Management Note    Patient Name: Thomas Barry  Date of Birth: March 03, 1966  Sex: male  Date/Time of Admission: 06/12/2018  5:18 PM  Room/Bed: 904/A  Payor: Holland Falling / Plan: [employer] PPO / Product Type: PPO /    LOS: 5 days   Primary Care Providers:  Pcp, No (General)    Admitting Diagnosis:  Small cell lung cancer (CMS [place]) [C34.90]    Assessment:      06/17/18 0829   Assessment Details   Assessment Type Continued Assessment   Date of Care Management Update 06/17/18   Date of Next DCP Update 06/19/18   Care Management Plan   Discharge Planning Status discharge plan complete   Projected Discharge Date 06/17/18   Discharge Needs Assessment   Discharge Facility/Level of Care Needs Home (Patient/Family Member/other)(code 1)       Discharge Plan:  Home (Patient/Family Member/other) (code 1)  Per TBR with service, patient to receive neulasta today then discharge home. Therapy recommendations are home with assist, no other care management needs.     The patient will continue to be evaluated for developing discharge needs.     Case Manager: Genella Rife, [place] COORDINATOR  Phone: [phone]

## 2018-06-18 ENCOUNTER — Ambulatory Visit: Payer: Self-pay

## 2018-06-18 ENCOUNTER — Ambulatory Visit (HOSPITAL_BASED_OUTPATIENT_CLINIC_OR_DEPARTMENT_OTHER): Payer: Self-pay | Admitting: General Practice

## 2018-06-18 ENCOUNTER — Other Ambulatory Visit: Payer: Self-pay

## 2018-06-18 ENCOUNTER — Encounter (HOSPITAL_COMMUNITY): Payer: Self-pay

## 2018-06-18 ENCOUNTER — Encounter (INDEPENDENT_AMBULATORY_CARE_PROVIDER_SITE_OTHER): Payer: Self-pay | Admitting: Student in an Organized Health Care Education/Training Program

## 2018-06-18 ENCOUNTER — Telehealth (HOSPITAL_COMMUNITY): Payer: Self-pay

## 2018-06-18 ENCOUNTER — Other Ambulatory Visit (INDEPENDENT_AMBULATORY_CARE_PROVIDER_SITE_OTHER): Payer: Commercial Managed Care - PPO

## 2018-06-18 ENCOUNTER — Other Ambulatory Visit (HOSPITAL_COMMUNITY): Payer: Self-pay

## 2018-06-18 ENCOUNTER — Encounter (FREE_STANDING_LABORATORY_FACILITY): Payer: Commercial Managed Care - PPO | Attending: Medical | Admitting: Medical

## 2018-06-18 ENCOUNTER — Telehealth (INDEPENDENT_AMBULATORY_CARE_PROVIDER_SITE_OTHER): Payer: Self-pay

## 2018-06-18 LAB — HEPATIC FUNCTION PANEL
ALBUMIN: 2.7 g/dL — ABNORMAL LOW (ref 3.5–5.0)
ALKALINE PHOSPHATASE: 798 U/L — ABNORMAL HIGH (ref 45–115)
ALT (SGPT): 143 U/L — ABNORMAL HIGH (ref <55)
AST (SGOT): 179 U/L — ABNORMAL HIGH (ref 8–48)
BILIRUBIN DIRECT: 4.9 mg/dL — ABNORMAL HIGH (ref <0.3)
BILIRUBIN TOTAL: 6.7 mg/dL — ABNORMAL HIGH (ref 0.3–1.3)
PROTEIN TOTAL: 6.6 g/dL (ref 6.4–8.3)

## 2018-06-18 NOTE — Nursing Note (Signed)
Contacted patient as a reminder of his appointment with [facility] tomorrow. Patient states he is unable to attend appointment tomorrow and would like to reschedule appointment. Appointment rescheduled for 06/25/2018. Patient states he is planning on attending appointment on 06/25/2018.    Marvetta Gibbons, [place]  06/18/2018, 09:40

## 2018-06-18 NOTE — Care Management Notes (Signed)
06/18/18 1341   Date and Time of Coordination Contact   Date 06/18/18   Time 1339   Patient Contact   Contact made via Phone   Call Outcome Left message   Additional Interventions   Total # Outreaches 2   Approx. Time Spent (min.) 0-5   [facility], LPN  10/16/348, 09:38

## 2018-06-19 ENCOUNTER — Inpatient Hospital Stay: Payer: 59

## 2018-06-19 ENCOUNTER — Encounter (FREE_STANDING_LABORATORY_FACILITY): Payer: Commercial Managed Care - PPO | Admitting: Family

## 2018-06-19 ENCOUNTER — Encounter (FREE_STANDING_LABORATORY_FACILITY)
Admit: 2018-06-19 | Discharge: 2018-06-19 | Disposition: A | Discharge status: Home / Self Care | Payer: Commercial Managed Care - PPO | Attending: Family | Admitting: Family

## 2018-06-19 ENCOUNTER — Other Ambulatory Visit (HOSPITAL_BASED_OUTPATIENT_CLINIC_OR_DEPARTMENT_OTHER): Payer: Self-pay | Admitting: Family

## 2018-06-19 ENCOUNTER — Encounter (HOSPITAL_BASED_OUTPATIENT_CLINIC_OR_DEPARTMENT_OTHER): Payer: Self-pay | Admitting: General Practice

## 2018-06-19 ENCOUNTER — Other Ambulatory Visit (INDEPENDENT_AMBULATORY_CARE_PROVIDER_SITE_OTHER): Payer: Commercial Managed Care - PPO

## 2018-06-19 ENCOUNTER — Encounter (INDEPENDENT_AMBULATORY_CARE_PROVIDER_SITE_OTHER): Payer: Self-pay | Admitting: Family

## 2018-06-19 DIAGNOSIS — C3490 Malignant neoplasm of unspecified part of unspecified bronchus or lung: Secondary | ICD-10-CM | POA: Insufficient documentation

## 2018-06-19 LAB — BILIRUBIN, CONJUGATED (DIRECT)
BILIRUBIN DIRECT: 5.4 mg/dL — ABNORMAL HIGH (ref <0.3)
BILIRUBIN DIRECT: 5.4 mg/dL — ABNORMAL HIGH (ref <0.3)

## 2018-06-19 LAB — ALBUMIN: ALBUMIN: 2.3 g/dL — ABNORMAL LOW (ref 3.5–5.0)

## 2018-06-19 LAB — ALT (SGPT): ALT (SGPT): 129 U/L — ABNORMAL HIGH (ref <55)

## 2018-06-19 LAB — LDH: LDH: 1426 U/L — ABNORMAL HIGH (ref 125–220)

## 2018-06-19 LAB — BILIRUBIN TOTAL: BILIRUBIN TOTAL: 7.4 mg/dL — ABNORMAL HIGH (ref 0.3–1.3)

## 2018-06-19 LAB — AST (SGOT): AST (SGOT): 160 U/L — ABNORMAL HIGH (ref 8–48)

## 2018-06-19 LAB — ALK PHOS (ALKALINE PHOSPHATASE): ALKALINE PHOSPHATASE: 729 U/L — ABNORMAL HIGH (ref 45–115)

## 2018-06-19 NOTE — Nursing Note (Signed)
Call placed to patient's mother regarding patient's lab work from 06/18/2018.  Informed her that bili level has increased from discharge and Dr. Carolynn Serve wants patient to have levels redrawn again today.  Patient's mother verbalized that she will take him to [facility] now to have labs drawn.  Raliegh Ip Keeleigh Terris RN

## 2018-06-20 ENCOUNTER — Telehealth (HOSPITAL_COMMUNITY): Payer: Self-pay | Admitting: Internal Medicine

## 2018-06-20 NOTE — Care Management Notes (Signed)
[facility] DIRECT ADMIT    RESERVATION INFORMATION  Received call from:   Phone:   Referring Provider: [name]   Referring Provider Phone: [phone]    Transfer Source: HOME     Transfer Emergent:  Urgent  Transfer Reason: Patient not currently Hospitalized  Transfer Comments:   Admitted on:    Pt Class and Level of Care:    Diagnosis: Acute Liver Failure      Admitting Pt Class and Level of Care: Inpatient, Semi-Private,   Accepted By: Alda Berthold, HOSPITALIST 66    *Send Text Page to accepting service MD. *

## 2018-06-20 NOTE — Telephone Encounter (Signed)
Medical Oncology After Hours Telephone encounter:    Patient with up-trending bilirubin to now above 7. I have reached out to [name] but was unable to connect with him after repeated attempts. We have left a voice mail asking him to call back regarding a direct admission and his abnormal liver function labs. I have asked the [facility] transfer line to help facilitate a bed and have communicated this to the primary hospitalist team.    [name], MD  06/20/2018, 16:26  Hematology/Oncology Fellow, PGY-V  Department of [place]

## 2018-06-21 ENCOUNTER — Observation Stay
Admission: AD | Admit: 2018-06-21 | Discharge: 2018-06-21 | Disposition: A | Discharge status: Home / Self Care | Payer: Commercial Managed Care - PPO | Source: Ambulatory Visit | Attending: Internal Medicine | Admitting: Internal Medicine

## 2018-06-21 ENCOUNTER — Observation Stay (HOSPITAL_BASED_OUTPATIENT_CLINIC_OR_DEPARTMENT_OTHER): Payer: Commercial Managed Care - PPO | Admitting: Internal Medicine

## 2018-06-21 DIAGNOSIS — C7951 Secondary malignant neoplasm of bone: Secondary | ICD-10-CM

## 2018-06-21 DIAGNOSIS — B20 Human immunodeficiency virus [HIV] disease: Secondary | ICD-10-CM

## 2018-06-21 DIAGNOSIS — I2510 Atherosclerotic heart disease of native coronary artery without angina pectoris: Secondary | ICD-10-CM

## 2018-06-21 DIAGNOSIS — C3490 Malignant neoplasm of unspecified part of unspecified bronchus or lung: Secondary | ICD-10-CM | POA: Insufficient documentation

## 2018-06-21 DIAGNOSIS — R740 Nonspecific elevation of levels of transaminase and lactic acid dehydrogenase [LDH]: Secondary | ICD-10-CM

## 2018-06-21 DIAGNOSIS — C787 Secondary malignant neoplasm of liver and intrahepatic bile duct: Secondary | ICD-10-CM | POA: Insufficient documentation

## 2018-06-21 DIAGNOSIS — C779 Secondary and unspecified malignant neoplasm of lymph node, unspecified: Secondary | ICD-10-CM | POA: Insufficient documentation

## 2018-06-21 DIAGNOSIS — Z7982 Long term (current) use of aspirin: Secondary | ICD-10-CM | POA: Insufficient documentation

## 2018-06-21 DIAGNOSIS — Z951 Presence of aortocoronary bypass graft: Secondary | ICD-10-CM | POA: Insufficient documentation

## 2018-06-21 DIAGNOSIS — Z6838 Body mass index (BMI) 38.0-38.9, adult: Secondary | ICD-10-CM | POA: Insufficient documentation

## 2018-06-21 DIAGNOSIS — E119 Type 2 diabetes mellitus without complications: Secondary | ICD-10-CM | POA: Insufficient documentation

## 2018-06-21 DIAGNOSIS — E079 Disorder of thyroid, unspecified: Secondary | ICD-10-CM

## 2018-06-21 DIAGNOSIS — Z955 Presence of coronary angioplasty implant and graft: Secondary | ICD-10-CM | POA: Insufficient documentation

## 2018-06-21 DIAGNOSIS — Z79899 Other long term (current) drug therapy: Secondary | ICD-10-CM | POA: Insufficient documentation

## 2018-06-21 DIAGNOSIS — E0781 Sick-euthyroid syndrome: Secondary | ICD-10-CM | POA: Insufficient documentation

## 2018-06-21 DIAGNOSIS — Z7984 Long term (current) use of oral hypoglycemic drugs: Secondary | ICD-10-CM | POA: Insufficient documentation

## 2018-06-21 DIAGNOSIS — R7401 Elevation of levels of liver transaminase levels: Secondary | ICD-10-CM

## 2018-06-21 LAB — CBC WITH DIFF
HCT: 34.2 % — ABNORMAL LOW (ref 38.9–52.0)
HGB: 11 g/dL — ABNORMAL LOW (ref 13.4–17.5)
MCH: 27.8 pg (ref 26.0–32.0)
MCHC: 32.2 g/dL (ref 31.0–35.5)
MCV: 86.6 fL (ref 78.0–100.0)
PLATELETS: 122 10*3/uL — ABNORMAL LOW (ref 150–400)
RBC: 3.95 x10ˆ6/uL — ABNORMAL LOW (ref 4.50–6.10)
RDW-CV: 17.1 % — ABNORMAL HIGH (ref 11.5–15.5)
WBC: 4.6 x10ˆ3/uL (ref 3.7–11.0)

## 2018-06-21 LAB — LDH: LDH: 932 U/L — ABNORMAL HIGH (ref 125–220)

## 2018-06-21 LAB — PT/INR
INR: 1.08 (ref 0.80–1.20)
INR: 1.08 (ref 0.80–1.20)
PROTHROMBIN TIME: 12.4 s (ref 9.1–13.9)

## 2018-06-21 LAB — HEPATIC FUNCTION PANEL
ALBUMIN: 2.6 g/dL — ABNORMAL LOW (ref 3.5–5.0)
ALKALINE PHOSPHATASE: 775 U/L — ABNORMAL HIGH (ref 45–115)
ALT (SGPT): 117 U/L — ABNORMAL HIGH (ref <55)
AST (SGOT): 124 U/L — ABNORMAL HIGH (ref 8–48)
BILIRUBIN DIRECT: 3.6 mg/dL — ABNORMAL HIGH (ref <0.3)
BILIRUBIN TOTAL: 4.9 mg/dL — ABNORMAL HIGH (ref 0.3–1.3)
PROTEIN TOTAL: 6.7 g/dL (ref 6.4–8.3)
PROTEIN TOTAL: 6.7 g/dL (ref 6.4–8.3)

## 2018-06-21 LAB — MANUAL DIFF AND MORPHOLOGY-SYSMEX
BASOPHIL #: 0.28 x10ˆ3/uL — ABNORMAL HIGH (ref <=0.20)
BASOPHIL %: 6 %
EOSINOPHIL #: 0.18 x10?3/uL (ref <=0.50)
EOSINOPHIL #: 0.18 x10ˆ3/uL (ref <=0.50)
EOSINOPHIL %: 4 %
LYMPHOCYTE #: 0.87 x10ˆ3/uL — ABNORMAL LOW (ref 1.00–4.80)
LYMPHOCYTE %: 15 %
MONOCYTE #: <0.1 x10ˆ3/uL — ABNORMAL LOW (ref 0.20–1.10)
MONOCYTE %: 2 %
NEUTROPHIL #: 3.17 x10ˆ3/uL (ref 1.50–7.70)
NEUTROPHIL %: 69 %
NRBC FROM MANUAL DIFF: 0 per 100 WBC
REACTIVE LYMPHOCYTE %: 4 %

## 2018-06-21 LAB — BASIC METABOLIC PANEL
ANION GAP: 13 mmol/L (ref 4–13)
BUN/CREA RATIO: 15 (ref 6–22)
BUN: 11 mg/dL (ref 8–25)
CALCIUM: 8.9 mg/dL (ref 8.5–10.2)
CHLORIDE: 104 mmol/L (ref 96–111)
CO2 TOTAL: 17 mmol/L — ABNORMAL LOW (ref 22–32)
CREATININE: 0.72 mg/dL (ref 0.62–1.27)
ESTIMATED GFR: >60 mL/min/1.73mˆ2 (ref >60)
GLUCOSE: 107 mg/dL (ref 65–139)
POTASSIUM: 4.1 mmol/L (ref 3.5–5.1)
SODIUM: 134 mmol/L — ABNORMAL LOW (ref 136–145)

## 2018-06-21 LAB — MAGNESIUM: MAGNESIUM: 1.9 mg/dL (ref 1.6–2.6)

## 2018-06-21 LAB — URIC ACID: URIC ACID: 4.3 mg/dL (ref 3.5–7.5)

## 2018-06-21 LAB — PHOSPHORUS: PHOSPHORUS: 2.4 mg/dL (ref 2.4–4.7)

## 2018-06-21 MED ORDER — SULFAMETHOXAZOLE 800 MG-TRIMETHOPRIM 160 MG TABLET
1.0000 | ORAL_TABLET | Freq: Every day | ORAL | Status: DC
Start: 2018-06-22 — End: 2018-06-21

## 2018-06-21 MED ORDER — SODIUM CHLORIDE 0.9 % (FLUSH) INJECTION SYRINGE
2.0000 mL | INJECTION | INTRAMUSCULAR | Status: DC | PRN
Start: 2018-06-21 — End: 2018-06-21

## 2018-06-21 MED ORDER — BUPROPION HCL SR 150 MG TABLET,12 HR SUSTAINED-RELEASE
150.00 mg | ORAL_TABLET | Freq: Two times a day (BID) | ORAL | Status: DC
Start: 2018-06-21 — End: 2018-06-21

## 2018-06-21 MED ORDER — OXYCODONE 5 MG TABLET
5.0000 mg | ORAL_TABLET | ORAL | Status: DC | PRN
Start: 2018-06-21 — End: 2018-06-21

## 2018-06-21 MED ORDER — PROMETHAZINE 25 MG TABLET
25.00 mg | ORAL_TABLET | Freq: Three times a day (TID) | ORAL | Status: DC | PRN
Start: 2018-06-21 — End: 2018-06-21

## 2018-06-21 MED ORDER — EMTRICITABINE 200 MG-TENOFOVIR ALAFENAMIDE FUMARATE 25 MG TABLET
1.0000 | ORAL_TABLET | Freq: Every day | ORAL | Status: DC
Start: 2018-06-22 — End: 2018-06-21

## 2018-06-21 MED ORDER — SENNOSIDES 8.6 MG-DOCUSATE SODIUM 50 MG TABLET
1.0000 | ORAL_TABLET | Freq: Two times a day (BID) | ORAL | Status: DC
Start: 2018-06-21 — End: 2018-06-21
  Administered 2018-06-21: 0 via ORAL

## 2018-06-21 MED ORDER — SODIUM CHLORIDE 1 GRAM TABLET
1.00 g | ORAL_TABLET | Freq: Three times a day (TID) | ORAL | Status: DC
Start: 2018-06-21 — End: 2018-06-21

## 2018-06-21 MED ORDER — ZOLPIDEM 5 MG TABLET
10.00 mg | ORAL_TABLET | Freq: Every evening | ORAL | Status: DC | PRN
Start: 2018-06-21 — End: 2018-06-21

## 2018-06-21 MED ORDER — SERTRALINE 100 MG TABLET
100.0000 mg | ORAL_TABLET | Freq: Every day | ORAL | Status: DC
Start: 2018-06-22 — End: 2018-06-21

## 2018-06-21 MED ORDER — BENZONATATE 100 MG CAPSULE
100.00 mg | ORAL_CAPSULE | Freq: Three times a day (TID) | ORAL | Status: DC | PRN
Start: 2018-06-21 — End: 2018-06-21

## 2018-06-21 MED ORDER — SODIUM CHLORIDE 0.9 % (FLUSH) INJECTION SYRINGE
2.0000 mL | INJECTION | Freq: Three times a day (TID) | INTRAMUSCULAR | Status: DC
Start: 2018-06-21 — End: 2018-06-21

## 2018-06-21 MED ORDER — METOPROLOL TARTRATE 25 MG TABLET
25.00 mg | ORAL_TABLET | Freq: Two times a day (BID) | ORAL | Status: DC
Start: 2018-06-21 — End: 2018-06-21

## 2018-06-21 MED ORDER — ASPIRIN 81 MG TABLET,DELAYED RELEASE
81.0000 mg | DELAYED_RELEASE_TABLET | Freq: Every day | ORAL | Status: DC
Start: 2018-06-22 — End: 2018-06-21

## 2018-06-21 MED ORDER — DOLUTEGRAVIR 50 MG TABLET
50.0000 mg | ORAL_TABLET | Freq: Every day | ORAL | Status: DC
Start: 2018-06-22 — End: 2018-06-21
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
## 2018-06-21 NOTE — Ancillary Notes (Signed)
Patient has been assigned to Hospitalist 12 service at this time. Please call that team with any questions.

## 2018-06-21 NOTE — Nurses Notes (Signed)
Admitted and Assessed patient. Patient is A&O x 4. No report of pain.  Talked about plan of care for the day. Assessment charted per flow sheet. Low fall precaustions maintained. Will continue to monitor.

## 2018-06-21 NOTE — Discharge Summary (Addendum)
[facility]                DISCHARGE SUMMARY    PATIENT NAME:  Thomas Barry, Thomas Barry  MRN:  [id number]  DOB:  [birth year]    ENCOUNTER DATE:  06/21/2018  INPATIENT ADMISSION DATE: 06/21/2018  DISCHARGE DATE:  06/21/2018    ATTENDING PHYSICIAN: Girard Cooter, DO  SERVICE: HOSPITALIST 12 ONCOLOGY  PRIMARY CARE PHYSICIAN: No Pcp         PRIMARY DISCHARGE DIAGNOSIS:   Active Hospital Problems    Diagnosis Date Noted   . Hyperbilirubinemia [E80.6] 06/21/2018      Resolved Hospital Problems   No resolved problems to display.     Active Non-Hospital Problems    Diagnosis Date Noted   . Morbid obesity 06/13/2018   . Small cell lung cancer (CMS [place]) 06/12/2018   . HIV (human immunodeficiency virus infection) (CMS [place]) 06/12/2018   . CAD (coronary artery disease) 06/12/2018   . DM (diabetes mellitus) (CMS [place]) 06/12/2018        DISCHARGE MEDICATIONS:     Current Discharge Medication List      CONTINUE these medications - NO CHANGES were made during your visit.      Details   aspirin 81 mg Tablet, Delayed Release (E.C.)  Commonly known as:  ECOTRIN   81 mg, Oral, DAILY  Refills:  0     benzonatate 100 mg Capsule  Commonly known as:  TESSALON   100 mg, Oral, 3 TIMES DAILY  Refills:  0     buPROPion 300 mg Tablet Sustained Release 24 hr  Commonly known as:  WELLBUTRIN XL   300 mg, Oral, DAILY  Refills:  0     Descovy 200-25 mg Tablet  Generic drug:  emtricitabine-tenofovir alafen   1 Tab, Oral, DAILY  Refills:  0     dolutegravir 50 mg Tablet  Commonly known as:  TIVICAY   50 mg, Oral, DAILY  Refills:  0     Jardiance 25 mg Tablet  Generic drug:  empagliflozin   25 mg, Oral, DAILY  Refills:  0     MetFORMIN 1,000 mg Tablet  Commonly known as:  GLUCOPHAGE   1,000 mg, Oral, 2 TIMES DAILY WITH FOOD  Refills:  0     metoprolol tartrate 25 mg Tablet  Commonly known as:  LOPRESSOR   25 mg, Oral, 2 TIMES DAILY  Refills:  0     MIRALAX ORAL   Oral, DAILY  Refills:  0     ondansetron 4 mg Tablet  Commonly known as:  ZOFRAN   4 mg, Oral, EVERY 8 HOURS  PRN  Refills:  0     oxyCODONE 5 mg Capsule  Commonly known as:  OXY IR   5 mg, Oral, EVERY 4 HOURS PRN, 1-2 tabs   Qty:  25 Cap  Refills:  0     promethazine 25 mg Tablet  Commonly known as:  PHENERGAN   25 mg, Oral, EVERY 8 HOURS PRN  Refills:  0     Senokot 8.6 mg Tablet  Generic drug:  sennosides   8.6 mg, Oral, DAILY  Refills:  0     sertraline 100 mg Tablet  Commonly known as:  ZOLOFT   100 mg, Oral, DAILY  Refills:  0     sodium chloride 1 gram Tablet   1 g, Oral, 3 TIMES DAILY WITH MEALS, 2 tabs   Refills:  0     trimethoprim-sulfamethoxazole  800-160 mg Tablet  Commonly known as:  BACTRIM DS   160 mg, Oral, DAILY  Qty:  30 Tab  Refills:  2     zolpidem 10 mg Tablet  Commonly known as:  AMBIEN   10 mg, Oral, NIGHTLY PRN  Qty:  7 Tab  Refills:  0          Discharge med list refreshed?  YES         During this hospitalization did the patient have an AMI, PCI/PCTA, STENT or Isolated CABG?  No                        ALLERGIES:  No Known Allergies          HOSPITAL PROCEDURE(S):   Bedside Procedures:  No orders of the defined types were placed in this encounter.    Surgical     REASON FOR HOSPITALIZATION AND HOSPITAL COURSE     BRIEF HPI:  This is a 53 y.o., male admitted for elevated LFTs.    BRIEF HOSPITAL NARRATIVE: Patient was told to come in for direct admission 2/2 cancer history and elevated LFTs. He has no complaints upon arrival. LFTs were checked and improving. He was discharged home.     TRANSITION/POST DISCHARGE CARE/PENDING TESTS/REFERRALS: Continue with PCP follow up scheduled as well as hem/onc follow ups as scheduled.  Will recheck LFTs in 1 week.        CONDITION ON DISCHARGE:  A. Ambulation: Full ambulation  B. Self-care Ability: Complete  C. Cognitive Status Alert and Oriented x 3  D. Code status at discharge:   Code Status Information     Code Status    Full Code                 LINES/DRAINS/WOUNDS AT DISCHARGE:   Patient Lines/Drains/Airways Status    Active Line / Dialysis Catheter / Dialysis  Graft / Drain / Airway / Wound     None                DISCHARGE DISPOSITION:  Home discharge              DISCHARGE INSTRUCTIONS:       BILIRUBIN TOTAL     ALT (SGPT)     AST (SGOT)     DISCHARGE INSTRUCTION - DIABETIC DIET     Diet: DIABETIC DIET      DISCHARGE INSTRUCTION - ACTIVITY     Activity: AS TOLERATED                 Girard Cooter, DO    Copies sent to Care Team       Relationship Specialty Notifications Start End    Pcp, No PCP - General   06/12/18     Swager, Frederic Jericho, RN Registered Nurse [place], [place] Admissions 06/12/18             Referring providers can utilize [email or web address] to access their referred [place] patient's information.

## 2018-06-21 NOTE — Nurses Notes (Signed)
Went over discharge paperwork. Answered questions and concerns. Patient left the unit on foot.
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
## 2018-06-22 ENCOUNTER — Ambulatory Visit: Payer: Self-pay

## 2018-06-22 ENCOUNTER — Encounter (HOSPITAL_COMMUNITY): Payer: Self-pay

## 2018-06-22 ENCOUNTER — Telehealth (HOSPITAL_COMMUNITY): Payer: Self-pay

## 2018-06-22 NOTE — Ancillary Notes (Signed)
Utilization Review Determination    SECTION I  Reason for Physician Advisor Referral: Does not meet inpatient criteria. 06/21/18    Current [facility] MD Order: Patient has two orders inpatient and obs, correct order is obs    Utilization Review Findings: Patient meets observation status.  Utilization Review MD: Dr. Hester Mates    Based on clinical information available to me as per above and in chart, the patient's status should be: Observation    Saintclair Halsted, RN 06/22/2018, 13:36  -------------------------------------------------------------------------------------------------------------------  1.  I have reviewed this case with the patient's treating physician Marcelle Smiling, and the MD agrees with this change in status.  They are aware of the referral to the Utilization Review Committee.    Saintclair Halsted, RN 06/22/2018, 13:36  (Patient notification is required only if the status is changed while an Inpatient to Observation Status)  -------------------------------------------------------------------------------------------------------------------

## 2018-06-22 NOTE — Care Management Notes (Signed)
06/22/18 1312   Date and Time of Coordination Contact   Date 06/22/18   Time 1312   Patient Contact   Contact made via Phone   Call Outcome Left message   Additional Interventions   Total # Outreaches 2   Approx. Time Spent (min.) 0-5   [facility], LPN  10/27/9394, 88:64

## 2018-06-23 LAB — HISTORICAL SURGICAL PATHOLOGY SPECIMEN

## 2018-06-24 ENCOUNTER — Encounter (HOSPITAL_BASED_OUTPATIENT_CLINIC_OR_DEPARTMENT_OTHER): Payer: Self-pay | Admitting: General Practice

## 2018-06-24 ENCOUNTER — Ambulatory Visit
Admission: RE | Admit: 2018-06-24 | Discharge: 2018-06-24 | Disposition: A | Discharge status: Home / Self Care | Payer: Commercial Managed Care - PPO | Source: Ambulatory Visit | Attending: Geriatric Medicine | Admitting: Geriatric Medicine

## 2018-06-24 ENCOUNTER — Other Ambulatory Visit (HOSPITAL_BASED_OUTPATIENT_CLINIC_OR_DEPARTMENT_OTHER): Payer: Self-pay | Admitting: Geriatric Medicine

## 2018-06-24 ENCOUNTER — Ambulatory Visit (HOSPITAL_BASED_OUTPATIENT_CLINIC_OR_DEPARTMENT_OTHER): Payer: Commercial Managed Care - PPO | Admitting: General Practice

## 2018-06-24 ENCOUNTER — Encounter (HOSPITAL_BASED_OUTPATIENT_CLINIC_OR_DEPARTMENT_OTHER): Payer: Self-pay | Admitting: Geriatric Medicine

## 2018-06-24 ENCOUNTER — Ambulatory Visit (HOSPITAL_BASED_OUTPATIENT_CLINIC_OR_DEPARTMENT_OTHER): Payer: Commercial Managed Care - PPO | Admitting: Geriatric Medicine

## 2018-06-24 ENCOUNTER — Other Ambulatory Visit: Payer: Self-pay

## 2018-06-24 VITALS — BP 137/88 | HR 98 | Temp 98.6°F | Resp 18

## 2018-06-24 VITALS — BP 137/88 | HR 98 | Temp 98.6°F | Resp 18 | Ht 69.0 in | Wt 261.0 lb

## 2018-06-24 DIAGNOSIS — C3490 Malignant neoplasm of unspecified part of unspecified bronchus or lung: Secondary | ICD-10-CM

## 2018-06-24 DIAGNOSIS — Z87891 Personal history of nicotine dependence: Secondary | ICD-10-CM | POA: Insufficient documentation

## 2018-06-24 DIAGNOSIS — B20 Human immunodeficiency virus [HIV] disease: Secondary | ICD-10-CM | POA: Insufficient documentation

## 2018-06-24 DIAGNOSIS — E119 Type 2 diabetes mellitus without complications: Secondary | ICD-10-CM

## 2018-06-24 DIAGNOSIS — C771 Secondary and unspecified malignant neoplasm of intrathoracic lymph nodes: Secondary | ICD-10-CM | POA: Insufficient documentation

## 2018-06-24 DIAGNOSIS — G893 Neoplasm related pain (acute) (chronic): Secondary | ICD-10-CM

## 2018-06-24 DIAGNOSIS — F329 Major depressive disorder, single episode, unspecified: Secondary | ICD-10-CM | POA: Insufficient documentation

## 2018-06-24 DIAGNOSIS — R21 Rash and other nonspecific skin eruption: Secondary | ICD-10-CM | POA: Insufficient documentation

## 2018-06-24 DIAGNOSIS — C787 Secondary malignant neoplasm of liver and intrahepatic bile duct: Secondary | ICD-10-CM | POA: Insufficient documentation

## 2018-06-24 DIAGNOSIS — Z515 Encounter for palliative care: Secondary | ICD-10-CM

## 2018-06-24 DIAGNOSIS — C7931 Secondary malignant neoplasm of brain: Secondary | ICD-10-CM | POA: Insufficient documentation

## 2018-06-24 DIAGNOSIS — F419 Anxiety disorder, unspecified: Secondary | ICD-10-CM | POA: Insufficient documentation

## 2018-06-24 DIAGNOSIS — I10 Essential (primary) hypertension: Secondary | ICD-10-CM | POA: Insufficient documentation

## 2018-06-24 DIAGNOSIS — R627 Adult failure to thrive: Secondary | ICD-10-CM | POA: Insufficient documentation

## 2018-06-24 LAB — MANUAL DIFF AND MORPHOLOGY-SYSMEX
BASOPHIL #: 0.34 x10ˆ3/uL — ABNORMAL HIGH (ref <=0.20)
BASOPHIL %: 7 %
BLAST %: 2 % — ABNORMAL HIGH (ref <=0)
BLAST %: 2 % — ABNORMAL HIGH (ref <=0)
EOSINOPHIL #: 0.1 x10ˆ3/uL (ref <=0.50)
EOSINOPHIL %: 2 %
LYMPHOCYTE #: 1.18 x10ˆ3/uL (ref 1.00–4.80)
LYMPHOCYTE %: 7 %
METAMYELOCYTE %: 6 %
METAMYELOCYTE %: 6 %
MONOCYTE #: 0.34 x10ˆ3/uL (ref 0.20–1.10)
MONOCYTE %: 7 %
MYELOCYTE %: 4 %
NEUTROPHIL #: 2.21 x10ˆ3/uL (ref 1.50–7.70)
NEUTROPHIL %: 41 %
NEUTROPHIL %: 41 %
NEUTROPHIL BANDS %: 4 %
NRBC FROM MANUAL DIFF: 1 per 100 WBC
PROMYELOCYTE % MANUAL: 3 % — ABNORMAL HIGH (ref <=0)
REACTIVE LYMPHOCYTE %: 17 %

## 2018-06-24 LAB — CALCIUM: CALCIUM: 9.5 mg/dL (ref 8.5–10.2)

## 2018-06-24 LAB — CBC WITH DIFF
HCT: 36.6 % — ABNORMAL LOW (ref 38.9–52.0)
HCT: 36.6 % — ABNORMAL LOW (ref 38.9–52.0)
HGB: 11.6 g/dL — ABNORMAL LOW (ref 13.4–17.5)
MCH: 28 pg (ref 26.0–32.0)
MCHC: 31.7 g/dL (ref 31.0–35.5)
MCV: 88.4 fL (ref 78.0–100.0)
MPV: 11.2 fL (ref 8.7–12.5)
PLATELETS: 114 10*3/uL — ABNORMAL LOW (ref 150–400)
RBC: 4.14 10*6/uL — ABNORMAL LOW (ref 4.50–6.10)
RDW-CV: 18 % — ABNORMAL HIGH (ref 11.5–15.5)
WBC: 4.9 10*3/uL (ref 3.7–11.0)

## 2018-06-24 LAB — PLATELETS AND ANC CANCER CENTER
NEUTROPHILS #: 1.53 10*3/uL (ref 1.50–7.70)
PLATELET COUNT: 118 10*3/uL — ABNORMAL LOW (ref 150–400)

## 2018-06-24 LAB — DRUG SCREEN, WITH CONFIRMATION, URINE
AMPHETAMINES URINE: NEGATIVE
BARBITURATES URINE: POSITIVE — AB
BENZODIAZEPINES URINE: NEGATIVE
BUPRENORPHINE URINE: NEGATIVE
CANNABINOIDS URINE: NEGATIVE
COCAINE METABOLITES URINE: NEGATIVE
CREATININE RANDOM URINE: 110 mg/dL
ECSTASY/MDMA URINE: NEGATIVE
METHADONE URINE: NEGATIVE
OPIATES URINE (LOW CUTOFF): NEGATIVE
OXIDANT-ADULTERATION: NEGATIVE
OXYCODONE URINE: NEGATIVE
PH-ADULTERATION: 5.8 (ref 4.5–9.0)
SPECIFIC GRAVITY-ADULTERATION: 1.018 g/mL (ref 1.005–1.030)

## 2018-06-24 LAB — LDH: LDH: 593 U/L — ABNORMAL HIGH (ref 125–220)

## 2018-06-24 LAB — AST (SGOT): AST (SGOT): 100 U/L — ABNORMAL HIGH (ref 8–48)

## 2018-06-24 LAB — ALT (SGPT): ALT (SGPT): 101 U/L — ABNORMAL HIGH (ref <55)

## 2018-06-24 LAB — PHOSPHORUS: PHOSPHORUS: 2.9 mg/dL (ref 2.4–4.7)

## 2018-06-24 LAB — SODIUM: SODIUM: 133 mmol/L — ABNORMAL LOW (ref 136–145)

## 2018-06-24 LAB — CREATININE WITH EGFR
CREATININE: 0.75 mg/dL (ref 0.62–1.27)
ESTIMATED GFR: >60 mL/min/{1.73_m2} (ref >60)

## 2018-06-24 LAB — MAGNESIUM
MAGNESIUM: 2.1 mg/dL (ref 1.6–2.6)
MAGNESIUM: 2.1 mg/dL (ref 1.6–2.6)

## 2018-06-24 LAB — PATH COMMENT: PATHOLOGIST INTERPRETATION: ABNORMAL — AB

## 2018-06-24 LAB — POTASSIUM: POTASSIUM: 4.1 mmol/L (ref 3.5–5.1)

## 2018-06-24 LAB — ALK PHOS (ALKALINE PHOSPHATASE): ALKALINE PHOSPHATASE: 795 U/L — ABNORMAL HIGH (ref 45–115)

## 2018-06-24 LAB — BILIRUBIN TOTAL: BILIRUBIN TOTAL: 3.1 mg/dL — ABNORMAL HIGH (ref 0.3–1.3)

## 2018-06-24 MED ORDER — OXYCODONE 5 MG TABLET: 5 mg | Tab | ORAL | 0 refills | 0 days | Status: DC | PRN

## 2018-06-24 MED ORDER — SENNOSIDES 8.6 MG TABLET: 9 mg | Tab | Freq: Two times a day (BID) | ORAL | 3 refills | 0 days | Status: AC | PRN
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
## 2018-06-24 NOTE — Progress Notes (Signed)
[facility] - Oncology    Patient Diagnosis: Lung   Oncologist: Dr Merrie Roof    ESAS [facility] Symptom Assessment System)  The number that best describes pain symptoms: 5  The number that best describes fatigue: 5  The number that best describes nausea: 0 (no nausea  The number that best describes depression: 6  The number that best describes anxiety: 4  The number that best describes drowsiness: 5  The number that best describes shortness of breath: 0 (no shortness of breath)  The number that best describes appetite: 3  The number that best describes feeling of wellbeing: 3  The number that best describes quality of sleep: 3  The number that best describes the financial burden of your disease: 0 (best financial burden)  The number that best describes spiritual: 6  Completed by:: patient         Goals this visit: Patient: Pain Management, Introduction to Supportive Care, Anxiety, Depression, Fatigue and Weakness                            Family: Introduction to Supportive Care    Patient came today: Pt arrived to clinic room 25 ambulatory and/or via wheelchair and accompanied by mother.       Pt relates a Fair appetite and eats 3 meals a day.  Reviewed patient's weight which has decreased 29 pounds in 2 months.     Patient was recently hospitalized from  06/20/2018 to  06/21/2018 for    Hyperbilirubinemia        [place] Pain Scale and ESAS were completed and positive for Pain, Fatigue, depression, anxiety, drowsiness and poor spirituality           Has the patient fallen in the last month:  no  Patient relates they are able to ambulate around the home and community without assistance of none.  Pt is able to complete their ADLs without assistance.    Participating in physical therapy: No      [place] Pain Rating Scale     On a scale of 0-10, during the past 24 hours, pain has interfered with you usual activity: 3     On a scale of 0-10, during the past 24 hours, pain has interfered with  your sleep: 3    On a scale of 0-10, during the past 24 hours, pain has affected your mood: 2     On a scale of 0-10, during the past 24 hours, pain has contributed to your stress: 2     On a scale of 0-10, what is your overall pain Rating: 5        Pain: 5 abdomen  Current Pain Medications:       Extended Release: Morphine ER      Immediate Release:Oxycodone Immediate Release  Number of breakthrough over the past 24 hours: 2  Side-effects of pain medications: fatigue/weakness      CONTROLLED SUBSTANCE TRACKING 06/24/2018   Date 06/24/2018   Medication use agreement signed 06/24/2018   UDS Date 06/24/2018   UDS result notes see results   [facility] check date 06/24/2018   [facility] notes copy in chart and given to MD       Social History     Tobacco Use   Smoking Status Former Smoker   . Packs/day: 1.50   . Years: 35.00   . Pack years: 52.50   Smokeless Tobacco Former  User     Social History     Substance and Sexual Activity   Alcohol Use Yes    Comment: occasional     Social History     Substance and Sexual Activity   Drug Use Not on file       Pt denies any use of illicit drugs, alcohol or tobacco.    Bowel Patterns:     Frequency: once a day     Consistency: formed     Last BM 06/24/2018    Review with the patient the need for a good bowel regimen, increased fluids, laxatives as ordered and need to monitor for a soft formed bowel movement ever 1-2 days to avoid opioid induced constipation.     Other Medications:   Current Outpatient Medications   Medication Sig   . aspirin (ECOTRIN) 81 mg Oral Tablet, Delayed Release (E.C.) Take 81 mg by mouth Once a day   . benzonatate (TESSALON) 100 mg Oral Capsule Take 100 mg by mouth Three times a day   . buPROPion (WELLBUTRIN XL) 300 mg extended release 24 hr tablet Take 300 mg by mouth Once a day   . dolutegravir (TIVICAY) tablet Take 50 mg by mouth Once a day   . empagliflozin (JARDIANCE) 25 mg Oral Tablet Take 25 mg by mouth Once a day   . emtricitabine-tenofovir alafen (DESCOVY) 200-25 mg  Oral Tablet Take 1 Tab by mouth Once a day   . MetFORMIN (GLUCOPHAGE) 1,000 mg Oral Tablet Take 1,000 mg by mouth Twice daily with food   . metoprolol tartrate (LOPRESSOR) 25 mg Oral Tablet Take 25 mg by mouth Twice daily   . ondansetron (ZOFRAN) 4 mg Oral Tablet Take 4 mg by mouth Every 8 hours as needed for Nausea/Vomiting   . oxyCODONE (ROXICODONE) 5 mg Oral Tablet Take 1 Tab (5 mg total) by mouth Every 4 hours as needed for Pain for up to 14 days   . polyethylene glycol 3350 (MIRALAX ORAL) Take by mouth Once a day   . promethazine (PHENERGAN) 25 mg Oral Tablet Take 25 mg by mouth Every 8 hours as needed for Nausea/Vomiting   . sennosides (SENOKOT) 8.6 mg Oral Tablet Take 8.6 mg by mouth Once a day   . sertraline (ZOLOFT) 100 mg Oral Tablet Take 100 mg by mouth Once a day   . sodium chloride 1 gram Oral Tablet Take 1 g by mouth Three times daily with meals 2 tabs   . trimethoprim-sulfamethoxazole (BACTRIM DS) 160-800mg  per tablet Take 1 Tab (160 mg total) by mouth Once a day for 90 days   . zolpidem (AMBIEN) 10 mg Oral Tablet Take 1 Tab (10 mg total) by mouth Every night as needed for Insomnia for up to 10 days       CHEMO REGIMEN: Carbo/Etop     Patient was provided with the following prescriptions with instruction: Oxy, Senna    Prescriptions were [facility] [place] with confirmation received.     Pt instructed to call clinic 5-7 days prior to needing a prescription refill.     Patient was provided with the following verbal instruction:      Patient Follow Up: is scheduled with Dr. Rica Mote on  08/05/2018      Janna Arch, RN, 06/24/2018, 17:09

## 2018-06-25 ENCOUNTER — Encounter (HOSPITAL_BASED_OUTPATIENT_CLINIC_OR_DEPARTMENT_OTHER): Payer: Self-pay | Admitting: General Practice

## 2018-06-25 ENCOUNTER — Ambulatory Visit (HOSPITAL_BASED_OUTPATIENT_CLINIC_OR_DEPARTMENT_OTHER): Payer: Self-pay | Admitting: General Practice

## 2018-06-25 ENCOUNTER — Encounter (INDEPENDENT_AMBULATORY_CARE_PROVIDER_SITE_OTHER): Payer: Self-pay | Admitting: Family

## 2018-06-25 ENCOUNTER — Ambulatory Visit (HOSPITAL_COMMUNITY): Payer: Self-pay | Admitting: Internal Medicine

## 2018-06-25 NOTE — Telephone Encounter (Addendum)
Spoke with the patient and he said that he has had a rash and itching since taking the Bactrim. He did not take the Bactrim yesterday or today and the rash has improved. He feels this medication is the cause of his rash and itching and would like something else to replace this medication. Forward to [facility] for f/u. [name], RN  ----- Message from [name] sent at 06/25/2018 12:05 PM EST -----  [name] pt   Pt states he is having an allergic reaction to his new meds, please return his call

## 2018-06-25 NOTE — Nursing Note (Signed)
Thomas Barry  [id number],     Patient Call     [name], RN routed conversation to [facility] 4 minutes ago (12:35 PM)      [name], RN 5 minutes ago (12:34 PM)         Spoke with the patient and he said that he has had a rash and itching since taking the Bactrim. He did not take the Bactrim yesterday or today and the rash has improved. He feels this medication is the cause of his rash and itching and would like something else to replace this medication. Forward to [facility] for f/u. [name], RN  ----- Message from [name] sent at 06/25/2018 12:05 PM EST -----  [name] pt   Pt states he is having an allergic reaction to his new meds, please return his call           Documentation       [name], [name] [phone]  [name] 36 minutes ago (12:04 PM)      Disposition     Patient message sent to provider         Call placed to patient and informed him that Bactrim was prescribed by hospitalist upon discharge for PCP prophylaxis and they he should not stop taking this without discussing with his infectious disease doctor first.  Informed him that his bilirubin level may have caused the itching and now that the bilirubin level is down, this may be why itching is better.  Patient verbalized understanding and agreement.

## 2018-06-25 NOTE — Telephone Encounter (Signed)
Regarding: Rx causing rash/ pt stopped taking  ----- Message from [facility] sent at 06/25/2018  1:12 PM EST -----  Dr. Charyl Dancer Roidad    Pt was prescribed the following antibiotic after being discharged from [facility] 06-21-2018. He stated that the medication was causing a rash to form on his stomach and back as an allergic reaction to it. He did not take his dose today and noticed that the rash has started going away. He was wanting to see if there was another medication he could take. Please call pt to advise. Thank you!    trimethoprim-sulfamethoxazole (BACTRIM DS) 160-800mg  per tablet 30 Tab 2 06/18/2018 09/16/2018   Sig - Route: Take 1 Tab (160 mg total) by mouth Once a day for 90 days - Oral   Sent to pharmacy as: sulfamethoxazole 800 mg-trimethoprim 160 mg tablet (BACTRIM DS)   Class: E-Rx     Preferred Pharmacy     [id number] [address], [place]    [place] [address]    Phone: [phone] Fax: [phone]    Not a 24 hour pharmacy; exact hours not known.

## 2018-06-28 ENCOUNTER — Encounter (HOSPITAL_BASED_OUTPATIENT_CLINIC_OR_DEPARTMENT_OTHER): Payer: Self-pay | Admitting: General Practice

## 2018-06-28 LAB — BARBITURATES CONFIRMATION, URINE
AMOBARBITAL-BY GC/MS: NEGATIVE ng/mL
BUTALBITAL-BY GC/MS: 1261 ng/mL
INTERPRETATION: POSITIVE
INTERPRETATION: POSITIVE
PENTOBARBITAL-BY GC/MS: NEGATIVE ng/mL
PHENOBARBITAL-BY GC/MS: NEGATIVE ng/mL
SECOBARBITAL-BY GC/MS: NEGATIVE ng/mL

## 2018-06-29 ENCOUNTER — Ambulatory Visit (INDEPENDENT_AMBULATORY_CARE_PROVIDER_SITE_OTHER): Payer: Self-pay

## 2018-06-29 ENCOUNTER — Other Ambulatory Visit (HOSPITAL_BASED_OUTPATIENT_CLINIC_OR_DEPARTMENT_OTHER): Payer: Self-pay | Admitting: General Practice

## 2018-06-29 ENCOUNTER — Encounter (HOSPITAL_BASED_OUTPATIENT_CLINIC_OR_DEPARTMENT_OTHER): Payer: Self-pay | Admitting: General Practice

## 2018-06-29 NOTE — Nursing Note (Signed)
[name]    Non-Urgent Medical Question     You  Jarmel, Linhardt Just now (11:21 AM)         Mr Czajkowski,    I just called the office and left a message for them to call you to get your port placement scheduled so hopefully you will receive a call today.  We are seeing you in clinic on 3/11 to do a check and will place your appointment orders for treatment on 3/17 at that time.  If you have any other questions, please call back at [phone] or send a message through [place].      Thank you,  Roney Jaffe RN        This [facility] message has not been read.      Westley Hummer, RN  Hemonc-Almubarak 3 hours ago (8:20 AM)      Message routed to Dr. Carolynn Serve and team. Westley Hummer, RN 06/29/2018, 08:20    Routing comment       Melvenia Beam, MD Yesterday (4:08 AM)         Dr Tommy Medal.       I have not seen an appointment scheduled for the port placement to be installed or the appointment times for the next round chemo which I believe you stated would start on the 17th.         Thanks

## 2018-06-30 ENCOUNTER — Ambulatory Visit: Payer: Commercial Managed Care - PPO | Attending: Infectious Disease

## 2018-06-30 ENCOUNTER — Ambulatory Visit (INDEPENDENT_AMBULATORY_CARE_PROVIDER_SITE_OTHER): Payer: Commercial Managed Care - PPO | Admitting: Rheumatology

## 2018-06-30 ENCOUNTER — Encounter (INDEPENDENT_AMBULATORY_CARE_PROVIDER_SITE_OTHER): Payer: Self-pay

## 2018-06-30 ENCOUNTER — Other Ambulatory Visit: Payer: Self-pay

## 2018-06-30 VITALS — BP 148/88 | HR 97 | Temp 97.3°F | Ht 69.02 in | Wt 268.7 lb

## 2018-06-30 DIAGNOSIS — B20 Human immunodeficiency virus [HIV] disease: Secondary | ICD-10-CM

## 2018-06-30 DIAGNOSIS — Z113 Encounter for screening for infections with a predominantly sexual mode of transmission: Secondary | ICD-10-CM

## 2018-06-30 LAB — URINALYSIS, MACROSCOPIC
BILIRUBIN: NEGATIVE mg/dL
BLOOD: NEGATIVE mg/dL
COLOR: NORMAL
GLUCOSE: >=500 mg/dL — AB
KETONES: NEGATIVE mg/dL
LEUKOCYTES: NEGATIVE WBCs/uL
NITRITE: NEGATIVE
PH: 6 (ref 5.0–8.0)
PROTEIN: 30 mg/dL — AB
SPECIFIC GRAVITY: 1.035 — ABNORMAL HIGH (ref 1.005–1.030)
UROBILINOGEN: 2 mg/dL — AB

## 2018-06-30 LAB — TORCH ANTIBODY, IGG
CMV IGG QUALITATIVE: POSITIVE — AB
HSV-1 IGG QUALITATIVE: POSITIVE — AB
HSV2 IGG QUALITATIVE: NEGATIVE
RUBELLA IGG TORCH QUALITATIVE: NEGATIVE — AB
TOXOPLASMA IGG QUALITATIVE: NEGATIVE

## 2018-06-30 LAB — URINALYSIS, MICROSCOPIC
RBCS: <1 /HPF (ref <6.0)
WBCS: 1 /HPF (ref <4.0)

## 2018-06-30 LAB — CD4/CD8
CD3 # (T CELL): 3985 {cells}/uL — ABNORMAL HIGH (ref 892–2436)
CD3 % (T CELL): 91 %
CD4 # (HELPER T CELL): 445 {cells}/uL (ref 382–1614)
CD4 % (HELPER T CELL): 10 %
CD4:CD8 RATIO: 0.1 — ABNORMAL LOW (ref 1.0–3.4)
CD8 # (SUPPRESSOR T CELL): 3363 cells/uL — ABNORMAL HIGH (ref 157–813)
CD8 # (SUPPRESSOR T CELL): 3363 {cells}/uL — ABNORMAL HIGH (ref 157–813)
CD8 % (SUPPRESSOR T CELL): 76 %

## 2018-06-30 LAB — COMPREHENSIVE METABOLIC PANEL, NON-FASTING
ALBUMIN: 3.4 g/dL — ABNORMAL LOW (ref 3.5–5.0)
ALKALINE PHOSPHATASE: 698 U/L — ABNORMAL HIGH (ref 45–115)
ALT (SGPT): 111 U/L — ABNORMAL HIGH (ref <55)
ANION GAP: 12 mmol/L (ref 4–13)
AST (SGOT): 106 U/L — ABNORMAL HIGH (ref 8–48)
BILIRUBIN TOTAL: 2.1 mg/dL — ABNORMAL HIGH (ref 0.3–1.3)
BUN/CREA RATIO: 9 (ref 6–22)
BUN: 7 mg/dL — ABNORMAL LOW (ref 8–25)
CALCIUM: 9.3 mg/dL (ref 8.5–10.2)
CHLORIDE: 104 mmol/L (ref 96–111)
CO2 TOTAL: 21 mmol/L — ABNORMAL LOW (ref 22–32)
CO2 TOTAL: 21 mmol/L — ABNORMAL LOW (ref 22–32)
CREATININE: 0.77 mg/dL (ref 0.62–1.27)
ESTIMATED GFR: >60 mL/min/1.73mˆ2 (ref >60)
GLUCOSE: 140 mg/dL — ABNORMAL HIGH (ref 65–139)
POTASSIUM: 4.4 mmol/L (ref 3.5–5.1)
POTASSIUM: 4.4 mmol/L (ref 3.5–5.1)
PROTEIN TOTAL: 7.3 g/dL (ref 6.0–7.9)
SODIUM: 137 mmol/L (ref 136–145)

## 2018-06-30 LAB — LIPID PANEL
CHOL/HDL RATIO: 5
CHOLESTEROL: 246 mg/dL — ABNORMAL HIGH (ref <200)
HDL CHOL: 49 mg/dL (ref >=39)
LDL CALC: 152 mg/dL — ABNORMAL HIGH (ref <=100)
NON-HDL: 197 mg/dL — ABNORMAL HIGH (ref <=190)
TRIGLYCERIDES: 226 mg/dL — ABNORMAL HIGH (ref <=150)
VLDL CALC: 45 mg/dL — ABNORMAL HIGH (ref <30)

## 2018-06-30 LAB — VITAMIN D 25, TOTAL: VITAMIN D, 25OH: 12 ng/mL — ABNORMAL LOW (ref 30–100)

## 2018-06-30 LAB — HEPATITIS A (HAV) IGG ANTIBODY: HAV IGG ANTIBODY: REACTIVE — AB

## 2018-06-30 NOTE — Nursing Note (Signed)
06/30/18 1000   Gad-7 (Over the last 2 weeks, how often have you been bothered by the following problems?)   Feeling nervous,anxious,on edge 1   Not being able to stop or control worrying 0   Worrying too much about different things 3   Trouble relaxing 1   Being so restless that it is hard to sit still 2   Becomin easily annoyed or irritable 1   Feeling afraid as if something awful might happen 1   How difficult have these problems made it for you to work, take care of things at home, or get along with other people? Somewhat difficult   Gad-7 Score   Gad-7 Score Total 9

## 2018-06-30 NOTE — Nursing Note (Signed)
[facility]   Section Of Infectious Diseases   [address] [place], [place] [address]     [facility] Initial Intake Note    Patient Name:  Thomas Barry  Medical Record Number:  [id number]  Referring Arlie Solomons  [address]  [place], [place] [address]  Date of Birth:  [birth year]  Date:  06/30/2018    REASON FOR VISIT:  Christoher Drudge is a 53 y.o. male who is here to establish care with the [facility] for a diagnosis of  HIV infection.      HISTORY OF PRESENT ILLNESS:      Past Medical History:   Diagnosis Date   . Diabetes (CMS [place])    . HIV positive (CMS [place]) 06/13/2018   . HTN (hypertension)    . Small cell lung cancer (CMS [facility])      Past Surgical History:   Procedure Laterality Date   . Hx coronary artery bypass graft     . Hx other       Family History   Problem Relation Age of Onset   . Breast Cancer Mother         DCIS   . Prostate Cancer Father 78        recurred 35 years later, currently cancer free   . Breast Cancer Sister 37        stage II   . Prostate Cancer Maternal Grandfather    . Prostate Cancer Paternal Uncle    . Rectal Cancer Maternal Uncle    . Ovarian Cancer Paternal Aunt    . Colon Cancer Paternal cousin      Allergies   Allergen Reactions   . Sulfa (Sulfonamides) Rash and Itching     Current Outpatient Medications   Medication Sig   . aspirin (ECOTRIN) 81 mg Oral Tablet, Delayed Release (E.C.) Take 81 mg by mouth Once a day   . benzonatate (TESSALON) 100 mg Oral Capsule Take 100 mg by mouth Three times a day   . buPROPion (WELLBUTRIN XL) 300 mg extended release 24 hr tablet Take 300 mg by mouth Once a day   . dolutegravir (TIVICAY) tablet Take 50 mg by mouth Once a day   . empagliflozin (JARDIANCE) 25 mg Oral Tablet Take 25 mg by mouth Once a day   . emtricitabine-tenofovir alafen (DESCOVY) 200-25 mg Oral Tablet Take 1 Tab by mouth Once a day   . MetFORMIN (GLUCOPHAGE) 1,000 mg Oral Tablet Take 1,000 mg by mouth Twice daily  with food   . metoprolol tartrate (LOPRESSOR) 25 mg Oral Tablet Take 25 mg by mouth Twice daily   . ondansetron (ZOFRAN) 4 mg Oral Tablet Take 4 mg by mouth Every 8 hours as needed for Nausea/Vomiting   . oxyCODONE (ROXICODONE) 5 mg Oral Tablet Take 1 Tab (5 mg total) by mouth Every 4 hours as needed for Pain for up to 14 days   . polyethylene glycol 3350 (MIRALAX ORAL) Take by mouth Once a day   . promethazine (PHENERGAN) 25 mg Oral Tablet Take 25 mg by mouth Every 8 hours as needed for Nausea/Vomiting   . sennosides (SENOKOT) 8.6 mg Oral Tablet Take 1 Tab (8.6 mg total) by mouth Twice per day as needed for up to 30 days   . sertraline (ZOLOFT) 100 mg Oral Tablet Take 100 mg by mouth Once a day   . sodium chloride 1 gram  Oral Tablet Take 1 g by mouth Three times daily with meals 2 tabs   . trimethoprim-sulfamethoxazole (BACTRIM DS) 160-800mg  per tablet Take 1 Tab (160 mg total) by mouth Once a day for 90 days (Patient not taking: Reported on 06/30/2018)     Social History     Socioeconomic History   . Marital status: Single     Spouse name: Not on file   . Number of children: Not on file   . Years of education: Not on file   . Highest education level: Not on file   Occupational History   . Occupation: Event organiser   Social Needs   . Financial resource strain: Not on file   . Food insecurity     Worry: Not on file     Inability: Not on file   . Transportation needs     Medical: Not on file     Non-medical: Not on file   Tobacco Use   . Smoking status: Former Smoker     Packs/day: 1.50     Years: 35.00     Pack years: 52.50   . Smokeless tobacco: Former Chief Strategy Officer and Sexual Activity   . Alcohol use: Yes     Comment: occasional   . Drug use: Not on file   . Sexual activity: Not on file   Lifestyle   . Physical activity     Days per week: Not on file     Minutes per session: Not on file   . Stress: Not on file   Relationships   . Social Product manager on phone: Not on file     Gets together:  Not on file     Attends religious service: Not on file     Active member of club or organization: Not on file     Attends meetings of clubs or organizations: Not on file     Relationship status: Not on file   . Intimate partner violence     Fear of current or ex partner: Not on file     Emotionally abused: Not on file     Physically abused: Not on file     Forced sexual activity: Not on file   Other Topics Concern   . Not on file   Social History Narrative   . Not on file     Highest Grade Completed in school: Associate degree    Do you have difficulty reading/writing? No       Transportation:  Is transportation available? Yes   Comments: Car    Eyes:18 months ago  Year of last eye exam: 18 Months ago  Currently uses [place]:  Year of last dental exam:  1 year ago  Dental Symptoms:  No      REVIEW OF SYSTEMS:  Yes     OBJECTIVE:  This was an Intake visit only for the [facility].  The patient was not seen by a physician, therefore a physical exam was not completed.  However the vital signs are as follows:  BP (!) 148/88   Pulse 97   Temp 36.3 C (97.3 F) (Oral)   Ht 1.753 m (5' 9.02")   Wt 121.9 kg (268 lb 11.9 oz)   SpO2 97%   BMI 39.67 kg/m         IMMUNIZATION STATUS:      There is no immunization  history on file for this patient.      MENTAL HEALTH HISTORY SUMMARIZED:     Any previous Psychiatric history /Inpatient stays and treatment? No  Any prior suicide attempts? No    Patient administered PHQ-9 and GAD-7 questionnaires. Completed questionnaire scanned into EPIC. Calculated scores entered below.       Baseline PHQ-9 Total Score:  5  Interpretation: (Highlight Response)  Minimal depression: 0-4  Mild depression: 5-9  Moderate depression: 10-14  Moderately severe depression: 15-19  Severe depression: 20-27    If you checked off any problems, how difficult have these made it for you to do your work, take care of things at home, or get along with other  people?  Some what difficult    Baseline GAD-7 Total Score:   9  Interpretation: (Highlight Response)  No anxiety disorder identified: 0-7    Probable anxiety disorder: 8+    If you checked off any problems, how difficult have these made it for you to do your work, take care of things at home, or get along with other people?  Some what Difficult    Behavioral Risk factors for HIV Infection/Transmission:  Unprotected Sexual contacts: Yes   Shared hypodermic needles: No.  Is/was the equipment clean? NA    Occupational:No  Tattoos/piercings: No   Transfusions: Yes     Sexual Risk Assessment:  Are you currently sexually active? No  Sexual Partners: Both  When was your last sexual encounter?3 years   Are your sexual partners aware of HIV status? Yes   Do you use barrier protection (i.e. Condoms)?100%  What percentage of time do you use protection?   Pre-diagnosis: 0%      Post Diagnosis:  100%  Sexual Behaviors: No  Estimated # of lifetime sexual partners 50  Any history of other STD's:  No    Resources already involved in care:  No      ASSESSMENT/PLAN:    1. HIV infection:  Ashwin Tibbs is a 53 y.o. male who has established care with the [facility] for a diagnosis of HIV.    HIV Prevention:     PARTNERSHIP FOR HEALTH INTERVENTION (HIV PREVENTION EDUCATION) Yes   Topics Discussed to Protect Self, Others, and Disclose Status to sexual Partners:  Yes   If Low Risk:  Yes   If High Risk:  No   Education: The patient was counseled verbally about HIV in general and was provided handouts/brochures on these topics related to HIV/AIDS, Care & Treatment and Wellness Yes.  Acute HIV Infection #103 Viral Load Tests #125 How Risky Is It?  #152   How Do I Start #201 HIV Resistance Testing #126 Safer Sex Guidelines #151   What is AIDS #101 Complete Blood Cell Counts   #121 Condoms #153   Aids Myths and  #158 Misunderstandings Blood Sugar and Fats #123 Drug Use and HIV #154   Telling Others You're HIV Positive #204 What  is Antiretroviral Therapy?  #403 Harm Reduction and HIV #155   HIV Testing #102 Adherence #405 Nutrition #800   Medical Appointments #203 Side Effects #550 Exercise and HIV #802   Immune Restoration Syndrome  #483  HIV and Cardiovascular Disease #652  Depression and HIV #558   HIV and Kidney Disease #651  HIV Health Checklist #209  Vaccinations and HIV #207       HIV and Inflammation #484  Recreational Drugs and HIV #494  HIV and the Mouth #653   CD4  Cell Tests #124   Stopping the Spread of HIV #150 Smoking and HIV #803   *www.aidsinfonet.org fact sheets     The patient was oriented to our clinic verbally and in writing.   He  was given an orientation packet including:  Clinic contact information, "Patient Rights and Responsibilities", Notice of Scientist, research (physical sciences), and Information regarding Medical Power of Attorney and Living Will.  Ample time was allotted for questions. Yes       Informed verbal consent was obtained for HIV testing. Yes      All baseline laboratory investigations will be ordered per clinic protocol order including:  Yes   No orders of the defined types were placed in this encounter.         A release of information Was  signed to obtain previous medical records.      2. Health Maintenance: No  vaccines administered today today per protocol order.      3. Ancillary Staff Needs:   RW RN Part B   4. Physician Appointment:  A new patient appointment will be scheduled with 07/08/18 [place] @ 9am   Some labs wasn't ordered due to fact patient has been a long time patient with HIV.    Marletta Lor, MA  06/30/2018

## 2018-06-30 NOTE — Nursing Note (Signed)
06/30/18 1100   PHQ 9 (follow up)   Little interest or pleasure in doing things. 1   Feeling down, depressed, or hopeless 0   Trouble falling or staying asleep, or sleeping too much. 2   Feeling tired or having little energy 0   Poor appetite or overeating 0   Feeling bad about yourself/ that you are a failure in the past 2 weeks? 0   Trouble concentrating on things in the past 2 weeks? 1   Moving/Speaking slowly or being fidgety or restless  in the past 2 weeks? 1   Thoughts that you would be better off DEAD, or of hurting yourself in some way. 0   If you checked off any problems, how difficult have these problems made it for you to do your work, take care of things at home, or get along with other people? Somewhat difficult   PHQ 9 Total 5

## 2018-06-30 NOTE — Nursing Note (Signed)
Met with pt for intro health education visit.  Pt has relocated to the area due to tx for CA and is est with our program for HIV while here.  Quit smoking 2 years ago following a triple bypass. Denies and current needs or concerns.  Will f/u as needed at future visits.

## 2018-07-01 ENCOUNTER — Encounter (INDEPENDENT_AMBULATORY_CARE_PROVIDER_SITE_OTHER): Payer: Self-pay | Admitting: Infectious Disease

## 2018-07-01 ENCOUNTER — Ambulatory Visit (HOSPITAL_BASED_OUTPATIENT_CLINIC_OR_DEPARTMENT_OTHER): Payer: Commercial Managed Care - PPO | Admitting: Student in an Organized Health Care Education/Training Program

## 2018-07-01 ENCOUNTER — Ambulatory Visit
Admission: RE | Admit: 2018-07-01 | Discharge: 2018-07-01 | Disposition: A | Discharge status: Home / Self Care | Payer: Commercial Managed Care - PPO | Source: Ambulatory Visit | Attending: General Practice | Admitting: General Practice

## 2018-07-01 ENCOUNTER — Encounter (HOSPITAL_BASED_OUTPATIENT_CLINIC_OR_DEPARTMENT_OTHER): Payer: Self-pay | Admitting: Family

## 2018-07-01 ENCOUNTER — Ambulatory Visit (HOSPITAL_BASED_OUTPATIENT_CLINIC_OR_DEPARTMENT_OTHER): Payer: Commercial Managed Care - PPO | Admitting: Family

## 2018-07-01 VITALS — Temp 97.5°F | Ht 68.98 in | Wt 263.7 lb

## 2018-07-01 VITALS — BP 119/65 | HR 98 | Temp 97.9°F | Resp 18 | Ht 70.0 in | Wt 265.0 lb

## 2018-07-01 DIAGNOSIS — R21 Rash and other nonspecific skin eruption: Secondary | ICD-10-CM

## 2018-07-01 DIAGNOSIS — D649 Anemia, unspecified: Secondary | ICD-10-CM

## 2018-07-01 DIAGNOSIS — C3490 Malignant neoplasm of unspecified part of unspecified bronchus or lung: Secondary | ICD-10-CM | POA: Insufficient documentation

## 2018-07-01 DIAGNOSIS — Z9221 Personal history of antineoplastic chemotherapy: Secondary | ICD-10-CM | POA: Insufficient documentation

## 2018-07-01 DIAGNOSIS — Z85828 Personal history of other malignant neoplasm of skin: Secondary | ICD-10-CM

## 2018-07-01 DIAGNOSIS — C787 Secondary malignant neoplasm of liver and intrahepatic bile duct: Secondary | ICD-10-CM | POA: Insufficient documentation

## 2018-07-01 DIAGNOSIS — C7951 Secondary malignant neoplasm of bone: Secondary | ICD-10-CM

## 2018-07-01 DIAGNOSIS — E119 Type 2 diabetes mellitus without complications: Secondary | ICD-10-CM | POA: Insufficient documentation

## 2018-07-01 DIAGNOSIS — I10 Essential (primary) hypertension: Secondary | ICD-10-CM | POA: Insufficient documentation

## 2018-07-01 DIAGNOSIS — Z09 Encounter for follow-up examination after completed treatment for conditions other than malignant neoplasm: Secondary | ICD-10-CM

## 2018-07-01 DIAGNOSIS — Z21 Asymptomatic human immunodeficiency virus [HIV] infection status: Secondary | ICD-10-CM | POA: Insufficient documentation

## 2018-07-01 DIAGNOSIS — L853 Xerosis cutis: Secondary | ICD-10-CM | POA: Insufficient documentation

## 2018-07-01 DIAGNOSIS — R945 Abnormal results of liver function studies: Secondary | ICD-10-CM | POA: Insufficient documentation

## 2018-07-01 DIAGNOSIS — Z923 Personal history of irradiation: Secondary | ICD-10-CM | POA: Insufficient documentation

## 2018-07-01 LAB — SODIUM: SODIUM: 136 mmol/L (ref 136–145)

## 2018-07-01 LAB — CBC WITH DIFF
HCT: 34.1 % — ABNORMAL LOW (ref 38.9–52.0)
HGB: 10.8 g/dL — ABNORMAL LOW (ref 13.4–17.5)
MCH: 27.9 pg (ref 26.0–32.0)
MCHC: 31.7 g/dL (ref 31.0–35.5)
MCV: 88.1 fL (ref 78.0–100.0)
MPV: 10.1 fL (ref 8.7–12.5)
PLATELETS: 256 x10ˆ3/uL (ref 150–400)
RBC: 3.87 x10ˆ6/uL — ABNORMAL LOW (ref 4.50–6.10)
RDW-CV: 19.3 % — ABNORMAL HIGH (ref 11.5–15.5)
WBC: 21.6 x10ˆ3/uL — ABNORMAL HIGH (ref 3.7–11.0)

## 2018-07-01 LAB — LDH: LDH: 497 U/L — ABNORMAL HIGH (ref 125–220)

## 2018-07-01 LAB — MANUAL DIFF AND MORPHOLOGY-SYSMEX
BASOPHIL #: <0.1 x10ˆ3/uL (ref <=0.20)
BASOPHIL %: 0 %
EOSINOPHIL #: <0.1 x10ˆ3/uL (ref <=0.50)
EOSINOPHIL %: 0 %
LYMPHOCYTE #: 3.02 x10ˆ3/uL (ref 1.00–4.80)
LYMPHOCYTE %: 14 %
METAMYELOCYTE %: 5 %
MONOCYTE #: 1.51 x10ˆ3/uL — ABNORMAL HIGH (ref 0.20–1.10)
MONOCYTE %: 7 %
MYELOCYTE %: 2 %
NEUTROPHIL #: 15.55 x10ˆ3/uL — ABNORMAL HIGH (ref 1.50–7.70)
NEUTROPHIL %: 71 %
NEUTROPHIL BANDS %: 1 %
NRBC FROM MANUAL DIFF: 3 per 100 WBC
RBC MORPHOLOGY: NORMAL

## 2018-07-01 LAB — HIV-1 RNA QUANTITATIVE PCR, PLASMA: HIV1 RNA VIRAL LOAD: 54 copies/mL — ABNORMAL HIGH

## 2018-07-01 LAB — ALK PHOS (ALKALINE PHOSPHATASE): ALKALINE PHOSPHATASE: 656 U/L — ABNORMAL HIGH (ref 45–115)

## 2018-07-01 LAB — SYPHILIS SCREENING ALGORITHM WITH REFLEX, SERUM
RPR: NONREACTIVE
TREPONEMAL AB QUALITATIVE: NONREACTIVE

## 2018-07-01 LAB — MAGNESIUM: MAGNESIUM: 1.9 mg/dL (ref 1.6–2.6)

## 2018-07-01 LAB — CHLAMYDIA TRACHOMITIS DNA BY PCR (INHOUSE): CHLAMYDIA TRACHOMATIS PCR: NOT DETECTED

## 2018-07-01 LAB — CREATININE WITH EGFR
CREATININE: 0.78 mg/dL (ref 0.62–1.27)
ESTIMATED GFR: >60 mL/min/1.73mˆ2 (ref >60)

## 2018-07-01 LAB — NEISSERIA GONORRHOEAE DNA BY PCR: NEISSERIA GONORRHOEAE PCR: NOT DETECTED

## 2018-07-01 LAB — ALT (SGPT): ALT (SGPT): 103 U/L — ABNORMAL HIGH (ref <55)

## 2018-07-01 LAB — POTASSIUM: POTASSIUM: 4.2 mmol/L (ref 3.5–5.1)

## 2018-07-01 LAB — CALCIUM: CALCIUM: 9.7 mg/dL (ref 8.5–10.2)

## 2018-07-01 LAB — BILIRUBIN TOTAL: BILIRUBIN TOTAL: 2.2 mg/dL — ABNORMAL HIGH (ref 0.3–1.3)

## 2018-07-01 LAB — PHOSPHORUS: PHOSPHORUS: 3 mg/dL (ref 2.4–4.7)

## 2018-07-01 LAB — AST (SGOT): AST (SGOT): 98 U/L — ABNORMAL HIGH (ref 8–48)

## 2018-07-01 MED ORDER — TRIAMCINOLONE ACETONIDE 0.1 % TOPICAL OINTMENT: g | Freq: Two times a day (BID) | CUTANEOUS | 5 refills | 0 days | Status: AC | PRN

## 2018-07-01 NOTE — Progress Notes (Signed)
Dermatology Clinic, [facility]  [place] [address]  [phone]    Date:   07/01/2018  Name: Thomas Barry  Age: 53 y.o.    Chief complaint: Skin Check    HPI  Thomas Barry is a 53 y.o. male with history of metastatic SCLC who presents for resolved rash. He states he was hospitalized end of February 2020 for first round of chemotherapy with cisplatin/etoposide and also receives other new medications including Bactrim, Zofran, and more he cannot recall. 3-5 days later developed red, itchy rash on trunk. No skin sloughing, oral pain, or pain with urination. Rash resolved with no treatment and is not present today. Does mention history of rash after taking Bactrim many years ago. No other new, changing, bleeding, or symptomatic lesions or moles.     Review of Systems   Constitutional: Negative for chills and fever.   Skin: Negative for itching and rash.     Current Medications  . aspirin (ECOTRIN) 81 mg Oral Tablet, Delayed Release (E.C.) Take 81 mg by mouth Once a day   . benzonatate (TESSALON) 100 mg Oral Capsule Take 100 mg by mouth Three times a day   . buPROPion (WELLBUTRIN XL) 300 mg extended release 24 hr tablet Take 300 mg by mouth Once a day   . dolutegravir (TIVICAY) tablet Take 50 mg by mouth Once a day   . empagliflozin (JARDIANCE) 25 mg Oral Tablet Take 25 mg by mouth Once a day   . emtricitabine-tenofovir alafen (DESCOVY) 200-25 mg Oral Tablet Take 1 Tab by mouth Once a day   . MetFORMIN (GLUCOPHAGE) 1,000 mg Oral Tablet Take 1,000 mg by mouth Twice daily with food   . metoprolol tartrate (LOPRESSOR) 25 mg Oral Tablet Take 25 mg by mouth Twice daily   . ondansetron (ZOFRAN) 4 mg Oral Tablet Take 4 mg by mouth Every 8 hours as needed for Nausea/Vomiting   . oxyCODONE (ROXICODONE) 5 mg Oral Tablet Take 1 Tab (5 mg total) by mouth Every 4 hours as needed for Pain for up to 14 days   . polyethylene glycol 3350 (MIRALAX ORAL) Take by mouth Once a day   .  promethazine (PHENERGAN) 25 mg Oral Tablet Take 25 mg by mouth Every 8 hours as needed for Nausea/Vomiting   . sennosides (SENOKOT) 8.6 mg Oral Tablet Take 1 Tab (8.6 mg total) by mouth Twice per day as needed for up to 30 days   . sertraline (ZOLOFT) 100 mg Oral Tablet Take 100 mg by mouth Once a day   . sodium chloride 1 gram Oral Tablet Take 1 g by mouth Three times daily with meals 2 tabs   . triamcinolone acetonide (ARISTOCORT A) 0.1 % Ointment by Apply Topically route Twice per day as needed   . trimethoprim-sulfamethoxazole (BACTRIM DS) 160-800mg  per tablet Take 1 Tab (160 mg total) by mouth Once a day for 90 days (Patient not taking: Reported on 06/30/2018)     Allergies   Allergen Reactions   . Sulfa (Sulfonamides) Rash and Itching     Past Medical History:   Diagnosis Date   . Diabetes (CMS [place])    . HIV positive (CMS [place]) 06/13/2018   . HTN (hypertension)    . Small cell lung cancer (CMS [facility])          Physical Exam  Vitals: Temperature 36.4 C (97.5 F), temperature source Temporal, height 1.752 m (5' 8.98"), weight 119.6 kg (263 lb 10.7  oz).  Physical Exam  Constitutional:       General: He is not in acute distress.     Appearance: He is not ill-appearing.   Skin:     General: Skin is warm and dry.              Assessment and Plan  Problem List Items Addressed This Visit        Respiratory    Small cell lung cancer (CMS [place])      Other Visit Diagnoses     Xerosis of skin    -  Primary        1.Rash, resolved  Favor symptoms are secondary to medication   Most likely cause is Bactrim, especially given patient's history of rash after Bactrim use. Advised patient to tell doctors to avoid this medication.  Discussed how patient took multiple new medications and there could be alternative causes, advised to continue to monitor skin for rash and communicate concerns with PCP and oncology  Start triamcinolone 0.1% ointment as needed if rash returns    RTC as needed    Rosetta Posner, MD  07/01/2018,  14:10       I saw and examined the patient.  I reviewed the resident's note.  I agree with the findings and plan of care as documented in the resident's note.  Any exceptions/additions are edited/noted.    Mauri Reading, MD

## 2018-07-02 ENCOUNTER — Other Ambulatory Visit: Payer: Self-pay | Admitting: Hematology and Oncology

## 2018-07-02 MED ORDER — ATOVAQUONE 750 MG/5 ML ORAL SUSPENSION: 1500 mg | Bottle | Freq: Every morning | ORAL | 3 refills | 0 days | Status: DC

## 2018-07-03 ENCOUNTER — Telehealth: Payer: Self-pay

## 2018-07-03 ENCOUNTER — Encounter (HOSPITAL_BASED_OUTPATIENT_CLINIC_OR_DEPARTMENT_OTHER): Payer: Self-pay | Admitting: General Practice

## 2018-07-03 NOTE — Telephone Encounter (Signed)
Received 3 calls from cancer treatment center in [facility] regarding need for our team to discontinue prior auth for treatment so that they may initiate PA for pt to get treated there.  In basket msg sent to [facility] on 3/12 regarding this.  Dee's direct number given to Vincente Liberty, NP to contact.

## 2018-07-03 NOTE — Cancer Center Note (Signed)
[place]       CANCER CENTER NOTE     Date: 07/01/2018  Name: Thomas Barry  MRN: [id number]  Referring Physician: Merrie Roof, MD  Primary Care Provider: No Pcp    REASON FOR VISIT:53 y.o.male from [place] [address] for evaluation and management of extensive stage small cell lung cancer.    HISTORY OF PRESENT ILLNESS:   - Early Feb 2020. Patient developed increased dyspnea on exertion. He presented to outside facility in [place] where he was living at the time. He was found to have extensive disease in the chest with metastatic disease to the liver and bones. He was started on palliative radiation to the chest prior to pathology being completed.   - Jun 01, 2018. Liver biopsy completed. Pathology showed small cell carcinoma.   - Mid-Feb 2020. Completed palliative radiation to the chest.   - Feb 21-26, 2020. Initially presented to [place] to establish care. Admitted to [facility] to start palliative chemotherapy with carboplatin AUC 5 IV on day 1 and etoposide 100 mg/m2 IV on days 1, 2, and 3 of 21 day cycle. Staging PET CT was declined by patient's insurance. MRI MRCP showed extensive metastatic disease throughout the entirety of the liver. Patient received chemotherapy and was discharged when stable.     Thomas Barry is 53 y.o. male accompanied by his mother who provided some of the history.   Patient is feeling okay today. Has some nausea at times that is helped by medication. Improving fatigue. Mother says he has been doing pretty well at home.     REVIEW OF SYSTEMS:  General: (-) pain. (-) fevers (-) chills. (-) weight loss. (+) fatigue.  Lymphatic: (-) palpable masses. (-) night sweats.  Heme: (-) easy bruising (-) bleeding.  (-) recurrent infections.   HEENT. (-) vision changes (-) hearing changes. (-) dysphagia. (-) sore throat.   Heart: (-) chest pain. (-) palpitation. (-) orthopnea. (-) LE edema.   Lungs: (-) dyspnea (on exertion) (-) hemoptysis. (-) cough.    Abdomen: (-) poor appetite. (-) abdominal pain. (-) nausea (-) vomiting. (-) diarrhea. (-) constipation.   GU: (-) dysuria (-) Urgency. (-) Hematuria.   MS. (-) joint pain (-) ext swelling. (-) Back pain.    Dermatologic: (-) rashes. (-) pruritus.   Psychiatric: (-) Depression. (-) anxiety. (-) insomnia.   Neurologic: (-) headaches. (-) neuropathy. (-) weakness. (-) memory problems.  Other review of systems negative.     PAST MEDICAL HISTORY:  Past Medical History:   Diagnosis Date   . Diabetes (CMS [place])    . HIV positive (CMS [place]) 06/13/2018   . HTN (hypertension)    . Small cell lung cancer (CMS [facility])      MEDICATIONS:  Current Outpatient Medications   Medication Sig   . aspirin (ECOTRIN) 81 mg Oral Tablet, Delayed Release (E.C.) Take 81 mg by mouth Once a day   . atovaquone (MEPRON) 750 mg/5 mL Oral Suspension Take 10 mL (1,500 mg total) by mouth Every morning with breakfast   . benzonatate (TESSALON) 100 mg Oral Capsule Take 100 mg by mouth Three times a day   . buPROPion (WELLBUTRIN XL) 300 mg extended release 24 hr tablet Take 300 mg by mouth Once a day   . dolutegravir (TIVICAY) tablet Take 50 mg by mouth Once a day   . empagliflozin (JARDIANCE) 25 mg Oral Tablet Take 25 mg by mouth Once  a day   . emtricitabine-tenofovir alafen (DESCOVY) 200-25 mg Oral Tablet Take 1 Tab by mouth Once a day   . MetFORMIN (GLUCOPHAGE) 1,000 mg Oral Tablet Take 1,000 mg by mouth Twice daily with food   . metoprolol tartrate (LOPRESSOR) 25 mg Oral Tablet Take 25 mg by mouth Twice daily   . ondansetron (ZOFRAN) 4 mg Oral Tablet Take 4 mg by mouth Every 8 hours as needed for Nausea/Vomiting   . oxyCODONE (ROXICODONE) 5 mg Oral Tablet Take 1 Tab (5 mg total) by mouth Every 4 hours as needed for Pain for up to 14 days   . polyethylene glycol 3350 (MIRALAX ORAL) Take by mouth Once a day   . promethazine (PHENERGAN) 25 mg Oral Tablet Take 25 mg by mouth Every 8 hours as needed for Nausea/Vomiting   . sennosides (SENOKOT) 8.6 mg Oral  Tablet Take 1 Tab (8.6 mg total) by mouth Twice per day as needed for up to 30 days   . sertraline (ZOLOFT) 100 mg Oral Tablet Take 100 mg by mouth Once a day   . sodium chloride 1 gram Oral Tablet Take 1 g by mouth Three times daily with meals 2 tabs   . triamcinolone acetonide (ARISTOCORT A) 0.1 % Ointment by Apply Topically route Twice per day as needed   . trimethoprim-sulfamethoxazole (BACTRIM DS) 160-841m per tablet Take 1 Tab (160 mg total) by mouth Once a day for 90 days (Patient not taking: Reported on 06/30/2018)     ORAL CHEMO ASSESSMENT:  Oral Chemotherapy Assessment  Currently prescribed any oral chemotherapy medication(s)?: No (07/01/18 1100)    ALLERGIES:  Allergies   Allergen Reactions   . Sulfa (Sulfonamides) Rash and Itching     PHYSICAL EXAMINATION:  Vitals:   Temperature  37 C (98.6 F) filed at... 06/24/2018 1259   Heart Rate  98 filed at... 06/24/2018 1259   Respiratory Rate  18 filed at... 06/24/2018 1259   BP (Non-Invasive)  137/88 filed at... 06/24/2018 1259   SpO2  --   Height  1.753 m (_0 ) filed at... 06/24/2018 1259   Weight  118.4 kg (261 lb 0.4 oz) filed at... 06/24/2018 1259   BMI (Calculated)  38.63 filed at... 06/24/2018 1259   BSA (Calculated)  2.4 filed at... 06/24/2018 1259   ECOG PS 1-2.  General General: Pleasant. No acute distress. Appearance has improved since last visit.   Eyes: Conjunctiva clear. Pupils equal and round. Sclera icteric.   HENT:ENT without erythema or injection, mucous membranes moist. Nose without erythema. Pharynx without injection or exudate.   Neck: No JVD or thyromegaly or lymphadenopathy and supple, symmetrical, trachea midline  Lungs: clear to auscultation bilaterally.   Cardiovascular: Heart regular rate and rhythm. S1, S2 normal  Abdomen: soft, non-tender, bowel sounds normal.  Extremities: no cyanosis or edema.   Skin: Skin color, texture, turgor normal. No visible rashes or lesions.   Neurologic: grossly normal, gait is normal and no  tremor  Lymphatics: no lymphadenopathy and cervical, supraclavicular, and axillary nodes normal.   Psychiatric: Alert and oriented x 3. Normal affect, behavior, and mood.     LABORATORY:  Results for orders placed or performed during the hospital encounter of 07/01/18 (from the past 150 hour(s))   ALK PHOS (ALKALINE PHOSPHATASE)   Result Value Ref Range    ALKALINE PHOSPHATASE 656 (H) 45 - 115 U/L   ALT (SGPT)   Result Value Ref Range    ALT (SGPT) 103 (H) <55 U/L  AST (SGOT)   Result Value Ref Range    AST (SGOT) 98 (H) 8 - 48 U/L   BILIRUBIN TOTAL   Result Value Ref Range    BILIRUBIN TOTAL 2.2 (H) 0.3 - 1.3 mg/dL   BLOOD CELL COUNT W/DIFF - CANCER CENTER    Narrative    The following orders were created for panel order BLOOD CELL COUNT W/DIFF - CANCER CENTER.  Procedure                               Abnormality         Status                     ---------                               -----------         ------                     CBC WITH DIFF[[id number]]                Abnormal            Final result                 Please view results for these tests on the individual orders.   CALCIUM   Result Value Ref Range    CALCIUM 9.7 8.5 - 10.2 mg/dL   CREATININE   Result Value Ref Range    CREATININE 0.78 0.62 - 1.27 mg/dL    ESTIMATED GFR >60 >60 mL/min/1.49m2    Narrative    Estimated Glomerular Filtration Rate (eGFR) calculated using the CKD-EPI (2009) equation, intended for patients 15years of age and older. If race and/or gender is not documented or "unknown," there will be no eGFR calculation.   LDH   Result Value Ref Range    LDH 497 (H) 125 - 220 U/L   MAGNESIUM   Result Value Ref Range    MAGNESIUM 1.9 1.6 - 2.6 mg/dL   PHOSPHORUS   Result Value Ref Range    PHOSPHORUS 3.0 2.4 - 4.7 mg/dL   POTASSIUM   Result Value Ref Range    POTASSIUM 4.2 3.5 - 5.1 mmol/L   SODIUM   Result Value Ref Range    SODIUM 136 136 - 145 mmol/L   CBC WITH DIFF   Result Value Ref Range    WBC 21.6 (H) 3.7 - 11.0 x10^3/uL    RBC  3.87 (L) 4.50 - 6.10 x10^6/uL    HGB 10.8 (L) 13.4 - 17.5 g/dL    HCT 34.1 (L) 38.9 - 52.0 %    MCV 88.1 78.0 - 100.0 fL    MCH 27.9 26.0 - 32.0 pg    MCHC 31.7 31.0 - 35.5 g/dL    RDW-CV 19.3 (H) 11.5 - 15.5 %    PLATELETS 256 150 - 400 x10^3/uL    MPV 10.1 8.7 - 12.5 fL   MANUAL DIFF AND MORPHOLOGY-SYSMEX   Result Value Ref Range    NEUTROPHIL % 71 %    LYMPHOCYTE %  14 %    MONOCYTE % 7 %    EOSINOPHIL % 0 %    BASOPHIL % 0 %    NEUTROPHIL BANDS % 1 %    METAMYELOCYTE %  5 %  MYELOCYTE % 2 %    NEUTROPHIL # 15.55 (H) 1.50 - 7.70 x10^3/uL    LYMPHOCYTE # 3.02 1.00 - 4.80 x10^3/uL    MONOCYTE # 1.51 (H) 0.20 - 1.10 x10^3/uL    EOSINOPHIL # <0.10 <=0.50 x10^3/uL    BASOPHIL # <0.10 <=0.20 x10^3/uL    NRBC FROM MANUAL DIFF 3 per 100 WBC    RBC MORPHOLOGY Normal RBC and PLT Morphology      ASSESSMENT: 53 y.o. male with PMH of HTN, diabetes, and HIV positive referred for extensive stage small cell lung cancer in Feb 2020.     PLAN:  - Extensive stage small cell lung cancer.  - We reviewed with the patient prior tissue pathology and imaging studies. We discussed with the patient in length the extent of the disease, prognosis, and the available options for treatment.   - Treatment goals are palliative given metastatic nature of disease.   - Tissue slides have been reviewed at our facility and confirm diagnosis.   - Imaging from outside facility in Feb 2020 showed extensive disease in the chest with metastatic disease to the liver and bones.  - MRI MRCP on Jun 14, 2018 showed extensive metastatic disease throughout the entirety of the liver.   - Patient received palliative radiation to the chest at outside facility which ended in mid-Feb 2020.  - Received first cycle of chemotherapy in-patient from Feb 22-24, 2020 with carboplatin AUC 5 IV on day 1 and etoposide 100 mg/m2 IV on days 1, 2, and 3 of 21 day cycle.  - Will add atezolizumab with cycle 2.   - The patient received extensive education (including written  material) about the benefits/ risks involved for all the agents in the planned chemotherapy regimen. The patient consented to the current treatment plan, and all the patient's questions were answered to satisfaction.   - Patient tolerated therapy with no new reported side effects.  - Increased WBC count is likely related to neulasta. Patient has no infectious symptoms.   - Infusaport placement planned on Jul 07, 2018.   - Has established care with supportive care given stage IV diagnosis.   - RTC in 1 week for cycle 2 of therapy.     - Elevated LFTs/bilirubin.  - Related to metastatic disease to the liver.  - Improving with chemotherapy. Bilirubin is down to 2.2.   - Will continue to monitor closely.     - Anemia.  - Hgb 10.8 today. Slight decrease since last visit.  - Normocytic.   - Likely related to chemotherapy/malignancy.   - Will continue to monitor and transfuse when symptomatic or Hgb less than 8.0.     - HIV-positive.  - Evaluated by ID while in-patient.  - Continue current medications and follow-up with ID.     - Diabetes.  - HgbA1c 7.7 on Jun 12, 2018.  - Continue current medications.     Patient evaluated independently.     Dorise Hiss, MSN, APRN, FNP-C

## 2018-07-03 NOTE — Nursing Note (Signed)
[name]    Message from [place] sent at 07/03/2018 3:45 PM EDT     Karena Addison is calling again, and is stating that she withdrew their prior authorization request so we can submit for outpatient chemo here.     Thank you    ----- Message from Ian Bushman, [place] sent at 07/03/2018 2:26 PM EDT -----  Carolynn Serve pt:  Dee from Dr. Calton Dach office says she is returning a call to [place]. She is asking for another call back.  Thank you.     Call History      Type Contact Phone User   07/03/2018 02:25 PM EDT Phone (Incoming) Dee/ Dr. Alvy Bimler office [phone] Ian Bushman, [place] insurance specialist notified that prior auth from other facility pulled their auth so we can resubmit.

## 2018-07-03 NOTE — Nursing Note (Signed)
[name]    Message from Osborne Oman sent at 07/03/2018 12:29 PM EDT     Almubarak Pt  Pt calling to see if forms have been faxed for FMLA to [facility]. He is wanting to speak to Christa about it. Please call Pt back to discuss.    Thank you   Call History      Type Contact Phone User   07/03/2018 12:27 PM EDT Phone (Incoming) Hanna, [place] (Self) [phone] Jerilynn Mages) Johnsie Kindred J     Call placed to patient, no answer.  Message left on voicemail that paperwork has been faxed to [facility] and I also left a message with the [facility] Case Manager Berneice Gandy.  Instructed to call back at [phone] with any further questions.  Raliegh Ip Joe Gee RN

## 2018-07-06 ENCOUNTER — Telehealth (HOSPITAL_COMMUNITY): Payer: Self-pay

## 2018-07-06 ENCOUNTER — Ambulatory Visit (INDEPENDENT_AMBULATORY_CARE_PROVIDER_SITE_OTHER): Payer: Self-pay

## 2018-07-06 ENCOUNTER — Other Ambulatory Visit (HOSPITAL_BASED_OUTPATIENT_CLINIC_OR_DEPARTMENT_OTHER): Payer: Self-pay | Admitting: General Practice

## 2018-07-06 NOTE — Patient Instructions (Signed)
You have an appointment in Interventional Radiology on 07/07/2018 at 0730 for an infusaport placement.  Please arrive 15 minutes prior to your appointment time and check in at the Registration Desk in the [place] [facility].     Your instructions are:  Fast for 8 hours prior to your arrival. You may have water up until 2 hours prior to your arrival.  Morning medications may be taken with water.  Please hold the following medications: Hold diabetic medication the morning of the procedure.  You will be given moderate sedation. You must have a driver available for transportation at discharge.     Please call Interventional Radiology at [phone] option [phone] if you have any further questions or concerns.

## 2018-07-07 ENCOUNTER — Encounter: Payer: Self-pay | Admitting: Urology

## 2018-07-07 ENCOUNTER — Inpatient Hospital Stay
Admission: RE | Admit: 2018-07-07 | Discharge: 2018-07-07 | Disposition: A | Discharge status: Still In Hospital | Payer: Commercial Managed Care - PPO | Source: Ambulatory Visit | Attending: RADIOLOGY-VASCULAR AND INTERVENTIONAL RADIOLOGY | Admitting: RADIOLOGY-VASCULAR AND INTERVENTIONAL RADIOLOGY

## 2018-07-07 ENCOUNTER — Encounter (HOSPITAL_BASED_OUTPATIENT_CLINIC_OR_DEPARTMENT_OTHER): Payer: Self-pay | Admitting: General Practice

## 2018-07-07 ENCOUNTER — Encounter (HOSPITAL_COMMUNITY)
Admission: RE | Disposition: A | Discharge status: Home / Self Care | Payer: Self-pay | Source: Ambulatory Visit | Attending: RADIOLOGY-VASCULAR AND INTERVENTIONAL RADIOLOGY

## 2018-07-07 ENCOUNTER — Encounter (HOSPITAL_COMMUNITY): Payer: Self-pay

## 2018-07-07 ENCOUNTER — Other Ambulatory Visit: Payer: Self-pay

## 2018-07-07 ENCOUNTER — Inpatient Hospital Stay
Admission: RE | Admit: 2018-07-07 | Discharge: 2018-07-07 | Disposition: A | Discharge status: Home / Self Care | Payer: Commercial Managed Care - PPO | Source: Ambulatory Visit | Attending: RADIOLOGY-VASCULAR AND INTERVENTIONAL RADIOLOGY | Admitting: RADIOLOGY-VASCULAR AND INTERVENTIONAL RADIOLOGY

## 2018-07-07 ENCOUNTER — Ambulatory Visit (HOSPITAL_BASED_OUTPATIENT_CLINIC_OR_DEPARTMENT_OTHER): Payer: Commercial Managed Care - PPO

## 2018-07-07 ENCOUNTER — Inpatient Hospital Stay (HOSPITAL_BASED_OUTPATIENT_CLINIC_OR_DEPARTMENT_OTHER): Payer: Commercial Managed Care - PPO | Admitting: General Practice

## 2018-07-07 ENCOUNTER — Inpatient Hospital Stay (HOSPITAL_BASED_OUTPATIENT_CLINIC_OR_DEPARTMENT_OTHER): Payer: Commercial Managed Care - PPO

## 2018-07-07 VITALS — BP 138/87 | HR 82 | Temp 97.5°F | Resp 18 | Ht 69.0 in | Wt 254.2 lb

## 2018-07-07 DIAGNOSIS — Z7984 Long term (current) use of oral hypoglycemic drugs: Secondary | ICD-10-CM | POA: Insufficient documentation

## 2018-07-07 DIAGNOSIS — Z7982 Long term (current) use of aspirin: Secondary | ICD-10-CM | POA: Insufficient documentation

## 2018-07-07 DIAGNOSIS — R600 Localized edema: Secondary | ICD-10-CM | POA: Insufficient documentation

## 2018-07-07 DIAGNOSIS — C3490 Malignant neoplasm of unspecified part of unspecified bronchus or lung: Secondary | ICD-10-CM | POA: Insufficient documentation

## 2018-07-07 DIAGNOSIS — R197 Diarrhea, unspecified: Secondary | ICD-10-CM

## 2018-07-07 DIAGNOSIS — I10 Essential (primary) hypertension: Secondary | ICD-10-CM | POA: Insufficient documentation

## 2018-07-07 DIAGNOSIS — E119 Type 2 diabetes mellitus without complications: Secondary | ICD-10-CM | POA: Insufficient documentation

## 2018-07-07 DIAGNOSIS — Z79891 Long term (current) use of opiate analgesic: Secondary | ICD-10-CM | POA: Insufficient documentation

## 2018-07-07 DIAGNOSIS — C787 Secondary malignant neoplasm of liver and intrahepatic bile duct: Secondary | ICD-10-CM

## 2018-07-07 DIAGNOSIS — C7951 Secondary malignant neoplasm of bone: Secondary | ICD-10-CM | POA: Insufficient documentation

## 2018-07-07 DIAGNOSIS — Z5111 Encounter for antineoplastic chemotherapy: Secondary | ICD-10-CM

## 2018-07-07 DIAGNOSIS — Z452 Encounter for adjustment and management of vascular access device: Secondary | ICD-10-CM | POA: Insufficient documentation

## 2018-07-07 DIAGNOSIS — Z21 Asymptomatic human immunodeficiency virus [HIV] infection status: Secondary | ICD-10-CM | POA: Insufficient documentation

## 2018-07-07 DIAGNOSIS — D649 Anemia, unspecified: Secondary | ICD-10-CM | POA: Insufficient documentation

## 2018-07-07 DIAGNOSIS — Z951 Presence of aortocoronary bypass graft: Secondary | ICD-10-CM | POA: Insufficient documentation

## 2018-07-07 DIAGNOSIS — Z029 Encounter for administrative examinations, unspecified: Secondary | ICD-10-CM

## 2018-07-07 DIAGNOSIS — Z79899 Other long term (current) drug therapy: Secondary | ICD-10-CM | POA: Insufficient documentation

## 2018-07-07 DIAGNOSIS — Z923 Personal history of irradiation: Secondary | ICD-10-CM | POA: Insufficient documentation

## 2018-07-07 DIAGNOSIS — Z7952 Long term (current) use of systemic steroids: Secondary | ICD-10-CM | POA: Insufficient documentation

## 2018-07-07 DIAGNOSIS — Z515 Encounter for palliative care: Secondary | ICD-10-CM | POA: Insufficient documentation

## 2018-07-07 DIAGNOSIS — B20 Human immunodeficiency virus [HIV] disease: Secondary | ICD-10-CM

## 2018-07-07 DIAGNOSIS — Z792 Long term (current) use of antibiotics: Secondary | ICD-10-CM | POA: Insufficient documentation

## 2018-07-07 LAB — CREATININE WITH EGFR
CREATININE: 0.72 mg/dL (ref 0.62–1.27)
CREATININE: 0.81 mg/dL (ref 0.62–1.27)
ESTIMATED GFR: >60 mL/min/1.73mˆ2 (ref >60)
ESTIMATED GFR: >60 mL/min/1.73mˆ2 (ref >60)

## 2018-07-07 LAB — POTASSIUM: POTASSIUM: 3.1 mmol/L — ABNORMAL LOW (ref 3.5–5.1)

## 2018-07-07 LAB — CBC
HCT: 38.6 % — ABNORMAL LOW (ref 38.9–52.0)
HGB: 12.4 g/dL — ABNORMAL LOW (ref 13.4–17.5)
MCH: 28.4 pg (ref 26.0–32.0)
MCHC: 32.1 g/dL (ref 31.0–35.5)
MCV: 88.5 fL (ref 78.0–100.0)
MPV: 9.6 fL (ref 8.7–12.5)
PLATELETS: 344 x10ˆ3/uL (ref 150–400)
RBC: 4.36 x10ˆ6/uL — ABNORMAL LOW (ref 4.50–6.10)
RDW-CV: 21.8 % — ABNORMAL HIGH (ref 11.5–15.5)
WBC: 19.6 x10ˆ3/uL — ABNORMAL HIGH (ref 3.7–11.0)

## 2018-07-07 LAB — CBC WITH DIFF
BASOPHIL #: <0.1 x10ˆ3/uL (ref <=0.20)
BASOPHIL %: 1 %
EOSINOPHIL #: <0.1 x10ˆ3/uL (ref <=0.50)
EOSINOPHIL %: 0 %
HCT: 36.3 % — ABNORMAL LOW (ref 38.9–52.0)
HGB: 11.5 g/dL — ABNORMAL LOW (ref 13.4–17.5)
IMMATURE GRANULOCYTE #: 0.52 10*3/uL — ABNORMAL HIGH (ref <0.10)
IMMATURE GRANULOCYTE %: 3 % — ABNORMAL HIGH (ref 0–1)
LYMPHOCYTE #: 2.78 x10ˆ3/uL (ref 1.00–4.80)
LYMPHOCYTE %: 15 %
MCH: 28.5 pg (ref 26.0–32.0)
MCHC: 31.7 g/dL (ref 31.0–35.5)
MCV: 90.1 fL (ref 78.0–100.0)
MONOCYTE #: 1.21 x10ˆ3/uL — ABNORMAL HIGH (ref 0.20–1.10)
MONOCYTE %: 7 %
MPV: 9.5 fL (ref 8.7–12.5)
NEUTROPHIL #: 13.83 10*3/uL — ABNORMAL HIGH (ref 1.50–7.70)
NEUTROPHIL #: 13.83 x10ˆ3/uL — ABNORMAL HIGH (ref 1.50–7.70)
NEUTROPHIL %: 74 %
PLATELETS: 289 x10ˆ3/uL (ref 150–400)
RBC: 4.03 x10ˆ6/uL — ABNORMAL LOW (ref 4.50–6.10)
RDW-CV: 21.7 % — ABNORMAL HIGH (ref 11.5–15.5)
WBC: 18.4 x10ˆ3/uL — ABNORMAL HIGH (ref 3.7–11.0)

## 2018-07-07 LAB — AST (SGOT): AST (SGOT): 164 U/L — ABNORMAL HIGH (ref 8–48)

## 2018-07-07 LAB — ALK PHOS (ALKALINE PHOSPHATASE): ALKALINE PHOSPHATASE: 656 U/L — ABNORMAL HIGH (ref 45–115)

## 2018-07-07 LAB — PHOSPHORUS: PHOSPHORUS: 2.6 mg/dL (ref 2.4–4.7)

## 2018-07-07 LAB — PT/INR
INR: 1.15 (ref 0.80–1.20)
PROTHROMBIN TIME: 13.3 s (ref 9.1–13.9)

## 2018-07-07 LAB — PLATELETS AND ANC CANCER CENTER
NEUTROPHILS #: 13.83 x10ˆ3/uL — ABNORMAL HIGH (ref 1.50–7.70)
PLATELET COUNT: 289 x10ˆ3/uL (ref 150–400)

## 2018-07-07 LAB — SODIUM
SODIUM: 138 mmol/L (ref 136–145)
SODIUM: 138 mmol/L (ref 136–145)

## 2018-07-07 LAB — ALT (SGPT): ALT (SGPT): 199 U/L — ABNORMAL HIGH (ref <55)

## 2018-07-07 LAB — POC BLOOD GLUCOSE (RESULTS): GLUCOSE, POC: 141 mg/dL — ABNORMAL HIGH (ref 70–105)

## 2018-07-07 LAB — CALCIUM: CALCIUM: 8.7 mg/dL (ref 8.5–10.2)

## 2018-07-07 LAB — LDH: LDH: 573 U/L — ABNORMAL HIGH (ref 125–220)

## 2018-07-07 LAB — BUN: BUN: 10 mg/dL (ref 8–25)

## 2018-07-07 LAB — CREATININE: CREATININE: 0.72 mg/dL (ref 0.62–1.27)

## 2018-07-07 LAB — BILIRUBIN TOTAL
BILIRUBIN TOTAL: 5.6 mg/dL — ABNORMAL HIGH (ref 0.3–1.3)
BILIRUBIN TOTAL: 5.6 mg/dL — ABNORMAL HIGH (ref 0.3–1.3)

## 2018-07-07 LAB — MAGNESIUM
MAGNESIUM: 2 mg/dL (ref 1.6–2.6)
MAGNESIUM: 2 mg/dL (ref 1.6–2.6)

## 2018-07-07 LAB — PTT (PARTIAL THROMBOPLASTIN TIME): APTT: 32.2 s (ref 24.2–37.5)

## 2018-07-07 SURGERY — IR INFUSAPORT PLACEMENT

## 2018-07-07 MED ORDER — LORAZEPAM 0.5 MG TABLET
0.50 mg | ORAL_TABLET | Freq: Four times a day (QID) | ORAL | Status: DC | PRN
Start: 2018-07-07 — End: 2018-07-08

## 2018-07-07 MED ORDER — MEPERIDINE (PF) 25 MG/ML INJECTION SYRINGE
12.50 mg | INJECTION | Freq: Once | INTRAMUSCULAR | Status: DC | PRN
Start: 2018-07-07 — End: 2018-07-08

## 2018-07-07 MED ORDER — DIPHENHYDRAMINE 50 MG/ML INJECTION SOLUTION
25.00 mg | Freq: Once | INTRAMUSCULAR | Status: DC | PRN
Start: 2018-07-07 — End: 2018-07-08

## 2018-07-07 MED ORDER — SODIUM CHLORIDE 0.9 % INTRAVENOUS SOLUTION
12.0000 mg | Freq: Once | INTRAVENOUS | Status: AC
Start: 2018-07-07 — End: 2018-07-07
  Administered 2018-07-07: 12 mg via INTRAVENOUS
  Administered 2018-07-07: 0 mg via INTRAVENOUS
  Filled 2018-07-07: qty 1.2

## 2018-07-07 MED ORDER — MIDAZOLAM 1 MG/ML INJECTION SOLUTION
INTRAMUSCULAR | Status: AC
Start: 2018-07-07 — End: 2018-07-07
  Filled 2018-07-07: qty 2

## 2018-07-07 MED ORDER — MIDAZOLAM 1 MG/ML INJECTION SOLUTION
Freq: Once | INTRAMUSCULAR | Status: DC | PRN
Start: 2018-07-07 — End: 2018-07-07
  Administered 2018-07-07 (×2): 1 mg via INTRAVENOUS

## 2018-07-07 MED ORDER — SODIUM CHLORIDE 0.9 % INTRAVENOUS SOLUTION
100.0000 mg/m2 | Freq: Once | INTRAVENOUS | Status: AC
Start: 2018-07-07 — End: 2018-07-07
  Administered 2018-07-07: 0 mg via INTRAVENOUS
  Filled 2018-07-07: qty 10

## 2018-07-07 MED ORDER — LORAZEPAM 2 MG/ML INJECTION SOLUTION
0.50 mg | Freq: Four times a day (QID) | INTRAMUSCULAR | Status: DC | PRN
Start: 2018-07-07 — End: 2018-07-08

## 2018-07-07 MED ORDER — ALBUTEROL SULFATE HFA 90 MCG/ACTUATION AEROSOL INHALER - RN
2.00 | Freq: Once | RESPIRATORY_TRACT | Status: DC | PRN
Start: 2018-07-07 — End: 2018-07-08

## 2018-07-07 MED ORDER — LIDOCAINE HCL 10 MG/ML (1 %) INJECTION SOLUTION
Freq: Once | INTRAMUSCULAR | Status: DC | PRN
Start: 2018-07-07 — End: 2018-07-07
  Administered 2018-07-07: 16 mL via INTRADERMAL

## 2018-07-07 MED ORDER — APREPITANT 130 MG/18 ML (7.2 MG/ML) INTRAVENOUS EMULSION
130.0000 mg | Freq: Once | INTRAVENOUS | Status: AC
Start: 2018-07-07 — End: 2018-07-07
  Administered 2018-07-07: 130 mg via INTRAVENOUS
  Filled 2018-07-07: qty 18

## 2018-07-07 MED ORDER — ALBUTEROL SULFATE 2.5 MG/3 ML (0.083 %) SOLUTION FOR NEBULIZATION
2.50 mg | INHALATION_SOLUTION | Freq: Once | RESPIRATORY_TRACT | Status: DC | PRN
Start: 2018-07-07 — End: 2018-07-08

## 2018-07-07 MED ORDER — DEXTROSE 5% IN WATER (D5W) FLUSH BAG - 250 ML
INTRAVENOUS | Status: DC | PRN
Start: 2018-07-07 — End: 2018-07-08

## 2018-07-07 MED ORDER — FENTANYL (PF) 50 MCG/ML INJECTION SOLUTION
Freq: Once | INTRAMUSCULAR | Status: DC | PRN
Start: 2018-07-07 — End: 2018-07-07
  Administered 2018-07-07 (×2): 50 ug via INTRAVENOUS

## 2018-07-07 MED ORDER — PROCHLORPERAZINE MALEATE 10 MG TABLET
10.00 mg | ORAL_TABLET | Freq: Four times a day (QID) | ORAL | Status: DC | PRN
Start: 2018-07-07 — End: 2018-07-08

## 2018-07-07 MED ORDER — SODIUM CHLORIDE 0.9 % INTRAVENOUS SOLUTION
750.0000 mg | Freq: Once | INTRAVENOUS | Status: AC
Start: 2018-07-07 — End: 2018-07-07
  Administered 2018-07-07: 17:00:00 0 mg via INTRAVENOUS
  Administered 2018-07-07: 750 mg via INTRAVENOUS
  Filled 2018-07-07: qty 75

## 2018-07-07 MED ORDER — EPINEPHRINE 1 MG/ML (1 ML) INJECTION SOLUTION
0.30 mg | Freq: Once | INTRAMUSCULAR | Status: DC | PRN
Start: 2018-07-07 — End: 2018-07-08

## 2018-07-07 MED ORDER — FAMOTIDINE (PF) 20 MG/2 ML INTRAVENOUS SOLUTION
20.00 mg | Freq: Once | INTRAVENOUS | Status: DC | PRN
Start: 2018-07-07 — End: 2018-07-08

## 2018-07-07 MED ORDER — PALONOSETRON 0.25 MG/5 ML INTRAVENOUS SOLUTION
0.2500 mg | Freq: Once | INTRAVENOUS | Status: AC
Start: 2018-07-07 — End: 2018-07-07
  Administered 2018-07-07: 0.25 mg via INTRAVENOUS
  Filled 2018-07-07: qty 5

## 2018-07-07 MED ORDER — LIDOCAINE HCL 10 MG/ML (1 %) INJECTION SOLUTION
INTRAMUSCULAR | Status: AC
Start: 2018-07-07 — End: 2018-07-07
  Filled 2018-07-07: qty 20

## 2018-07-07 MED ORDER — DIPHENHYDRAMINE 50 MG/ML INJECTION SOLUTION
50.00 mg | Freq: Once | INTRAMUSCULAR | Status: DC | PRN
Start: 2018-07-07 — End: 2018-07-08

## 2018-07-07 MED ORDER — CEFAZOLIN 2 GRAM SOLUTION FOR INJECTION - IV PUSH KIT
2.0000 g | INTRAMUSCULAR | Status: AC
Start: 2018-07-07 — End: 2018-07-07
  Administered 2018-07-07: 2 g via INTRAVENOUS
  Filled 2018-07-07: qty 20

## 2018-07-07 MED ORDER — SODIUM CHLORIDE 0.9% FLUSH BAG - 250 ML
INTRAVENOUS | Status: DC | PRN
Start: 2018-07-07 — End: 2018-07-08

## 2018-07-07 MED ORDER — HYDROCORTISONE SOD SUCCINATE 100 MG/2 ML VIAL WRAPPER
100.00 mg | Freq: Once | INTRAMUSCULAR | Status: DC | PRN
Start: 2018-07-07 — End: 2018-07-08

## 2018-07-07 MED ORDER — SODIUM CHLORIDE 0.9 % INTRAVENOUS SOLUTION
INTRAVENOUS | Status: DC
Start: 2018-07-07 — End: 2018-07-07

## 2018-07-07 MED ORDER — FENTANYL (PF) 50 MCG/ML INJECTION SOLUTION
INTRAMUSCULAR | Status: AC
Start: 2018-07-07 — End: 2018-07-07
  Filled 2018-07-07: qty 2

## 2018-07-07 MED ORDER — PROCHLORPERAZINE EDISYLATE 10 MG/2 ML (5 MG/ML) INJECTION SOLUTION
10.00 mg | Freq: Four times a day (QID) | INTRAMUSCULAR | Status: DC | PRN
Start: 2018-07-07 — End: 2018-07-08

## 2018-07-07 MED ADMIN — sodium chloride 0.9 % (flush) injection syringe: INTRADERMAL | @ 09:00:00

## 2018-07-07 MED ADMIN — midazolam 1 mg/mL injection solution: INTRAVENOUS | @ 09:00:00

## 2018-07-07 MED ADMIN — heparin lock flush (porcine) 10 unit/mL intravenous solution: INTRAVENOUS | @ 15:00:00

## 2018-07-07 MED ADMIN — sodium chloride 0.9 % (flush) injection syringe: INTRAVENOUS | @ 15:00:00

## 2018-07-07 MED ADMIN — sodium chloride 0.9 % (flush) injection syringe: INTRAVENOUS | @ 17:00:00

## 2018-07-07 SURGICAL SUPPLY — 5 items
CONV USE 338045 - PACK SURG CSTM RAD (CUSTOM TRAYS & PACK) ×1 IMPLANT
MICROPUNCTURE 5FR 21GA .018IN SS SET ACCESS 10CM 7CM STIFFEN CANN COAX CATH GW NEEDLE TIP STRL DISP (INTRODUCER) ×1 IMPLANT
PACK RADIOLOGY ANGIOGRAPHY (CUSTOM TRAYS & PACK) ×1
PORT IMPL INFUS POWERPORT ISP MRI ARGD CHRNFLX 8FR POWER INJ ATTACH CATH INTMD KIT SIL FIL SUT PLUG ×1 IMPLANT
SET MICROPUNCTURE 5FR STIFF_G48006 (INTRODUCER) ×1

## 2018-07-07 NOTE — Progress Notes (Signed)
Patient called to cancel his routine scheduled one-month follow-up visit status post completion of palliative radiotherapy to the large hilar mass.  He has since relocated and is receiving his systemic treatment through [facility] and will receive his follow-up care there as well.  Nicholos Johns, MMS, PA-C [place] at [place]: [phone]  Fax: [phone]

## 2018-07-07 NOTE — Progress Notes (Signed)
PATIENT NAME: Thomas Barry  CHART NUMBER: [id number]  DOB: 07-18-1965  DATE OF SERVICE: 07/07/18    MEDICAL NUTRITION THERAPY ASSESSMENT - NEW PT    53 year old male with small cell lung cancer.   PMH: DM, HTN    Ht: 175.3 cm (69 in) Wt: 115.3 kg (254 #) BMI: 37.5  IBW: 72.7 kg (160 #) IBW%: 159 % AjBW: 83.4 kg (183 #)    Pt and family present at interview. Pt reports eating 5-6 small frequent meals daily.  He eats fruits occasionally and vegetables daily. Pt eats a variety of protein-rich foods including: meat, cheese, eggs, yogurt, cottage cheese, peanut butter, nuts, and beans. Pt drinks 32-48 oz water, 2 Diet Coke, and 1 cup of coffee daily. He drinks a Premiere protein shake 2x/wk.     Pt reports UBW of 292# around mid 04/2018. He states he has lost 40# since then. Per EHR documentation, pt weight is down 15.8# (5.9%) x <1 mo and 9.5# (3.6%) x 6 days. These are significant weight losses.      Diet Recall:  Breakfast: muffin with peanut butter or jelly OR cereal with whole milk OR 2-3 eggs  Lunch: lasagna with salad OR roast with carrots and potatoes OR chili  Snack: soup  Supper: leftovers  Snack: yogurt, sandwich, or eggs    Estimated Nutrition Needs:  2500-2920 kcal/d (30-35 kcal/kg AjBW)  85-110 g protein/d (1.2-1.5 g protein/kg IBW)    Intervention/Evaluation:   1. Discussed importance of fruits, vegetables and fluids daily. Pt will increase water intake x 1 bottle/d.  2.   Provided and discussed protein sources handout. Pt will get a good source of protein at meals and snacks.  3.   Discussed and provided samples of Ensure Max and Glucerna. Provided coupons. Pt will drink at least one protein drink/Glucerna daily.  4.   Discussed and encouraged pt to continue to eat small frequent meals daily to try to maintain weight.  Pt was receptive of information and expressed understanding. Name and number was provided.    Greater than 25 minutes was spent interviewing and counseling pt.    Devrin Staggers, MS, RDN,  LD  Oncology Dietitian  Pager: [phone]

## 2018-07-07 NOTE — Nurses Notes (Signed)
Patient returned to [facility] from port placement. Dressing to chest CDI. No hematoma or bleeding. Port remains accessed for tx today. Vital signs per protocol. Bedside report received from [place], [place]. Patient educated on post-op complications and bedrest precautions; patient verbalized understanding.

## 2018-07-07 NOTE — Nurses Notes (Signed)
Patient arrived to [facility] for port placement. Patient is A&O x 4. No reported pain. Vital signs obtained per doc flow sheet. Attempting IV access and lab collection at this time. Oriented to room, surroundings, and call bell instructions. Patient verbalized call bell instructions.  Awaiting provider to consent patient.

## 2018-07-07 NOTE — Nurses Notes (Signed)
Patient recovered to baseline ambulatory status and tolerating fluids. Dressing stable and intact to right chest. Port remains accessed. PIV x 1 discontinued and intact. Discharge instructions reviewed with patient and copy given. Pt verbalized understanding of post procedure instructions.  Patient discharged with all belongings from [facility] via wheelchair.

## 2018-07-07 NOTE — Sedation Documentation (Signed)
07/07/18  Procedure(s):  IR INFUSAPORT PLACEMENT    Diagnosis:     Sedation Informed Consent, pre-sedation risk assessment and evaluation completed.  History of previous adverse experiences with sedation/analgesia/anesthesia assessed.  Monitored conscious sedation was administered under my direct supervision by an appropriately trained sedation nurse.  Appropriate Facility and Equipment compliant.      Procedure time out  Timeouts     Bush, Faylene Million, RN at Tue Jul 07, 2018 0850 EDT     Timeout Details     Timeout type:  Preprocedure          Procedures     Panel 1: IR INFUSAPORT PLACEMENT with Aris Everts Metta Clines, MD          Timeout Questions    Correct patient? Yes  Correct site? Yes  Correct side? Yes  Correct position? Yes  Correct procedure? Yes  Site marked? N/A  H&P note completed? Yes  Consents verified? Yes  Radiology studies available? Yes  Relevant lab results available? Yes  Allergies reviewed? Yes  Are all required blood products & devices for the procedure available? Yes  Is documentation verified? Yes           Staff Present     Physicians  McCluskey, Metta Clines, MD Staff  Benson Norway, RN  Heron Nay, [place]          Verification History     Staff Performed Verified    Benson Norway, RN Tue Jul 07, 2018 0850 EDT Tue Jul 07, 2018 0850 EDT                      Physician in  and out times  Physicians     Name Panel Role Time Period    Abran Richard, MD Panel 1 Primary 07/07/2018 0850 - 07/07/2018 0911      Sedation Staff     Name Type Time Period    Benson Norway, RN Invasive Nurse 07/07/2018 [phone]          Sedation and Procedure Times:  Sedation Start Time:: 4196  Sedation End/Recovery Start Time: 0902     Proc Name Event Type Event Time    IR INFUSAPORT PLACEMENT  Incision Start Tue Jul 07, 2018  8:55 AM    IR INFUSAPORT PLACEMENT  Incision Close Tue Jul 07, 2018  9:12 AM          Aldrete Scores    Pre Sedation  Activity: 2-->able to move 4 extremities voluntarily or on  command  Respiration: 2-->able to breathe and cough freely  Circulation: 2-->BP within 20% of pre-anesthetic level  Consciousness: 2-->fully awake  O2 Saturation: 2-->able to maintain O2 saturation greater than 92% on room air  Dressing: 2-->dry and clean or not applicable  Pain: 2-->pain free  Ambulation: 2-->able to stand up and walk straight, on ordered bedrest, or performing at previous level of functioning  Fasting/Feeding: 2-->able to drink fluids or NPO, minimal nausea/ no vomiting  Urine Output: 2-->has voided, adequate urine output per device, or not applicable  Modified Aldrete Score: 20      Post Sedation  Assessment Scored:: Post-Procedure  Activity: 2-->able to move 4 extremities voluntarily or on command  Respiration: 2-->able to breathe and cough freely  Circulation: 2-->BP within 20% of pre-anesthetic level  Consciousness: 2-->fully awake  O2 Saturation: 2-->able to maintain O2 saturation greater than 92% on room air  Dressing: 2-->dry and clean or not applicable  Pain:  2-->pain free  Ambulation: 2-->able to stand up and walk straight, on ordered bedrest, or performing at previous level of functioning  Urine Output: 2-->has voided, adequate urine output per device, or not applicable  Post Modified Aldrete Score: 20      Sedation Type: Moderate Sedation     Medications (moderate): Fentanyl, Versed  Hospital: [facility]  Unit: Interventional Radiology  IV Type: Peripheral IV  Additional Intervention needed:: No             Patient was continuously monitored throughout the procedure.  Provider was in attendance throughout sedation.  See Invasive Procedure Log for additional details.    Abran Richard, MD

## 2018-07-07 NOTE — Brief Op Note (Signed)
[facility]  Brief Post-Interventional Radiology Procedure Note    Patient Name:  [facility] Number: [id number]  Date of Birth:  31-Jan-1966  Date of Service: 07/07/2018     Procedure performed: [facility] placement    Attending: Aris Everts  Assistant: None    Pre-Operative Diagnosis: Small cell lung cancer   Post-Operative Diagnosis: Same    Type of Anesthesia: Conscious sedation  Estimate Blood Loss: None  Specimen's removed: None    Findings/Interventions/Complications:   Tip at cavoatrial junction  No comp    Recommendations and Follow-up:   OK to use port    Abran Richard, MD

## 2018-07-07 NOTE — Cancer Center Note (Signed)
[place]       CANCER CENTER NOTE     Date: 07/01/2018  Name: Thomas Barry  MRN: [id number]  Referring Physician: Timoteo Expose*  Primary Care Provider: No Pcp    REASON FOR VISIT:53 y.o.male from [place] [address] for evaluation and management of extensive stage small cell lung cancer.    HISTORY OF PRESENT ILLNESS:   - Early Feb 2020. Patient developed increased dyspnea on exertion. He presented to outside facility in [place] where he was living at the time. He was found to have extensive disease in the chest with metastatic disease to the liver and bones. He was started on palliative radiation to the chest prior to pathology being completed.   - Jun 01, 2018. Liver biopsy completed. Pathology showed small cell carcinoma.   - Mid-Feb 2020. Completed palliative radiation to the chest.   - Feb 21-26, 2020. Initially presented to [place] to establish care. Admitted to [facility] to start palliative chemotherapy with carboplatin AUC 5 IV on day 1 and etoposide 100 mg/m2 IV on days 1, 2, and 3 of 21 day cycle. Staging PET CT was declined by patient's insurance. MRI MRCP showed extensive metastatic disease throughout the entirety of the liver. Patient received chemotherapy and was discharged when stable.     [name] is 53 y.o. male accompanied by his mother who provided some of the history.   Patient is feeling okay today. He notes some diarrhea that started approx. 4 days ago and is having about 5 BMs/day. He states the stool is loose but not very watery. He is not having any abdominal pain and believes he is keeping up with his PO intake. He has some fatigue.     REVIEW OF SYSTEMS:  General: (-) pain. (-) fevers (-) chills. (-) weight loss. (+) fatigue.  Lymphatic: (-) palpable masses. (-) night sweats.  Heme: (-) easy bruising (-) bleeding.  (-) recurrent infections.   HEENT. (-) vision changes (-) hearing changes. (-) dysphagia. (-) sore throat.   Heart: (-)  chest pain. (-) palpitation. (-) orthopnea. (-) LE edema.   Lungs: (-) dyspnea (on exertion) (-) hemoptysis. (-) cough.   Abdomen: (-) poor appetite. (-) abdominal pain. (-) nausea (-) vomiting. (+) diarrhea. (-) constipation.   GU: (-) dysuria (-) Urgency. (-) Hematuria.   MS. (-) joint pain (-) ext swelling. (-) Back pain.    Dermatologic: (-) rashes. (-) pruritus.   Psychiatric: (-) Depression. (-) anxiety. (-) insomnia.   Neurologic: (-) headaches. (-) neuropathy. (-) weakness. (-) memory problems.  Other review of systems negative.     PAST MEDICAL HISTORY:  Past Medical History:   Diagnosis Date   . Diabetes (CMS [place])    . HIV positive (CMS [place]) 06/13/2018   . HTN (hypertension)    . Small cell lung cancer (CMS [facility])      MEDICATIONS:  Current Outpatient Medications   Medication Sig   . aspirin (ECOTRIN) 81 mg Oral Tablet, Delayed Release (E.C.) Take 81 mg by mouth Once a day   . atovaquone (MEPRON) 750 mg/5 mL Oral Suspension Take 10 mL (1,500 mg total) by mouth Every morning with breakfast   . benzonatate (TESSALON) 100 mg Oral Capsule Take 100 mg by mouth Three times a day   . buPROPion (WELLBUTRIN XL) 300 mg extended release 24 hr tablet Take 300 mg by mouth Once a day   . dolutegravir (  TIVICAY) tablet Take 50 mg by mouth Once a day   . empagliflozin (JARDIANCE) 25 mg Oral Tablet Take 25 mg by mouth Once a day   . emtricitabine-tenofovir alafen (DESCOVY) 200-25 mg Oral Tablet Take 1 Tab by mouth Once a day   . MetFORMIN (GLUCOPHAGE) 1,000 mg Oral Tablet Take 1,000 mg by mouth Twice daily with food   . metoprolol tartrate (LOPRESSOR) 25 mg Oral Tablet Take 25 mg by mouth Twice daily   . ondansetron (ZOFRAN) 4 mg Oral Tablet Take 4 mg by mouth Every 8 hours as needed for Nausea/Vomiting   . oxyCODONE (ROXICODONE) 5 mg Oral Tablet Take 1 Tab (5 mg total) by mouth Every 4 hours as needed for Pain for up to 14 days   . polyethylene glycol 3350 (MIRALAX ORAL) Take by mouth Once a day   . promethazine (PHENERGAN)  25 mg Oral Tablet Take 25 mg by mouth Every 8 hours as needed for Nausea/Vomiting   . sennosides (SENOKOT) 8.6 mg Oral Tablet Take 1 Tab (8.6 mg total) by mouth Twice per day as needed for up to 30 days   . sertraline (ZOLOFT) 100 mg Oral Tablet Take 100 mg by mouth Once a day   . sodium chloride 1 gram Oral Tablet Take 1 g by mouth Three times daily with meals 2 tabs   . triamcinolone acetonide (ARISTOCORT A) 0.1 % Ointment by Apply Topically route Twice per day as needed   . trimethoprim-sulfamethoxazole (BACTRIM DS) 160-889m per tablet Take 1 Tab (160 mg total) by mouth Once a day for 90 days (Patient not taking: Reported on 06/30/2018)     ORAL CHEMO ASSESSMENT:       ALLERGIES:  Allergies   Allergen Reactions   . Sulfa (Sulfonamides) Rash and Itching     PHYSICAL EXAMINATION:  Vitals:  Most Recent Vitals      [facility]  Temperature  36.4 C (97.5 F) filed at... 07/07/2018 1341   Heart Rate  82 filed at... 07/07/2018 1341   Respiratory Rate  18 filed at... 07/07/2018 1341   BP (Non-Invasive)  138/87 filed at... 07/07/2018 1341   SpO2  94 % filed at... 07/07/2018 1341   Height  1.753 m ('5\' 9"' ) filed at... 07/07/2018 1341   Weight  115.3 kg (254 lb 3.1 oz) filed at... 07/07/2018 1341   BMI (Calculated)  37.62 filed at... 07/07/2018 1341   BSA (Calculated)  2.37 filed at... 07/07/2018 1341      ECOG PS 1-2.  General General: Pleasant. No acute distress. Appearance has improved since last visit.   Eyes: Conjunctiva clear. Pupils equal and round. Sclera icteric.   HENT:ENT without erythema or injection, mucous membranes moist. Nose without erythema. Pharynx without injection or exudate.   Neck: No JVD or thyromegaly or lymphadenopathy and supple, symmetrical, trachea midline  Lungs: clear to auscultation bilaterally.   Cardiovascular: Heart regular rate and rhythm. S1, S2 normal  Abdomen: soft, non-tender, bowel sounds normal.  Extremities: no cyanosis or  edema.   Skin: Skin color, texture, turgor normal. No visible rashes or lesions.   Neurologic: grossly normal, gait is normal and no tremor  Lymphatics: no lymphadenopathy and cervical, supraclavicular, and axillary nodes normal.   Psychiatric: Alert and oriented x 3. Normal affect, behavior, and mood.     LABORATORY:  Results for orders placed or performed during the hospital encounter of 07/07/18 (from the past 150 hour(s))   ALK PHOS (  ALKALINE PHOSPHATASE)   Result Value Ref Range    ALKALINE PHOSPHATASE 656 (H) 45 - 115 U/L   ALT (SGPT)   Result Value Ref Range    ALT (SGPT) 199 (H) <55 U/L   AST (SGOT)   Result Value Ref Range    AST (SGOT) 164 (H) 8 - 48 U/L   BILIRUBIN TOTAL   Result Value Ref Range    BILIRUBIN TOTAL 5.6 (H) 0.3 - 1.3 mg/dL   BLOOD CELL COUNT W/DIFF - CANCER CENTER    Narrative    The following orders were created for panel order BLOOD CELL COUNT W/DIFF - CANCER CENTER.  Procedure                               Abnormality         Status                     ---------                               -----------         ------                     CBC WITH DIFF[[id number]]                Abnormal            Final result               PLATELETS AND ANC CANCER.Marland KitchenMarland Kitchen[[id number]]  Abnormal            Final result                 Please view results for these tests on the individual orders.   CALCIUM   Result Value Ref Range    CALCIUM 8.7 8.5 - 10.2 mg/dL   CREATININE   Result Value Ref Range    CREATININE 0.81 0.62 - 1.27 mg/dL    ESTIMATED GFR >60 >60 mL/min/1.17m2    Narrative    Estimated Glomerular Filtration Rate (eGFR) calculated using the CKD-EPI (2009) equation, intended for patients 194years of age and older. If race and/or gender is not documented or "unknown," there will be no eGFR calculation.   LDH   Result Value Ref Range    LDH 573 (H) 125 - 220 U/L   MAGNESIUM   Result Value Ref Range    MAGNESIUM 2.0 1.6 - 2.6 mg/dL   PHOSPHORUS   Result Value Ref Range    PHOSPHORUS 2.6 2.4 - 4.7 mg/dL     POTASSIUM   Result Value Ref Range    POTASSIUM 3.1 (L) 3.5 - 5.1 mmol/L   SODIUM   Result Value Ref Range    SODIUM 138 136 - 145 mmol/L   CBC WITH DIFF   Result Value Ref Range    WBC 18.4 (H) 3.7 - 11.0 x10^3/uL    RBC 4.03 (L) 4.50 - 6.10 x10^6/uL    HGB 11.5 (L) 13.4 - 17.5 g/dL    HCT 36.3 (L) 38.9 - 52.0 %    MCV 90.1 78.0 - 100.0 fL    MCH 28.5 26.0 - 32.0 pg    MCHC 31.7 31.0 - 35.5 g/dL    RDW-CV 21.7 (H) 11.5 - 15.5 %    PLATELETS 289 150 -  400 x10^3/uL    MPV 9.5 8.7 - 12.5 fL    NEUTROPHIL % 74 %    LYMPHOCYTE % 15 %    MONOCYTE % 7 %    EOSINOPHIL % 0 %    BASOPHIL % 1 %    NEUTROPHIL # 13.83 (H) 1.50 - 7.70 x10^3/uL    LYMPHOCYTE # 2.78 1.00 - 4.80 x10^3/uL    MONOCYTE # 1.21 (H) 0.20 - 1.10 x10^3/uL    EOSINOPHIL # <0.10 <=0.50 x10^3/uL    BASOPHIL # <0.10 <=0.20 x10^3/uL    IMMATURE GRANULOCYTE % 3 (H) 0 - 1 %    IMMATURE GRANULOCYTE # 0.52 (H) <0.10 x10^3/uL   PLATELETS AND ANC CANCER CENTER   Result Value Ref Range    NEUTROPHILS # 13.83 (H) 1.50 - 7.70 x10^3/uL    PLATELET COUNT 289 150 - 400 x10^3/uL   Results for orders placed or performed during the hospital encounter of 07/07/18 (from the past 150 hour(s))   PT/INR   Result Value Ref Range    PROTHROMBIN TIME 13.3 9.1 - 13.9 seconds    INR 1.15 0.80 - 1.20    Narrative    Coumadin therapy INR range for Conventional Anticoagulation is 2.0 to 3.0 and for Intensive Anticoagulation 2.5 to 3.5.   PTT (PARTIAL THROMBOPLASTIN TIME)   Result Value Ref Range    APTT 32.2 24.2 - 37.5 seconds    Narrative    Therapeutic range for unfractionated heparin is 60-100 seconds.   CBC   Result Value Ref Range    WBC 19.6 (H) 3.7 - 11.0 x10^3/uL    RBC 4.36 (L) 4.50 - 6.10 x10^6/uL    HGB 12.4 (L) 13.4 - 17.5 g/dL    HCT 38.6 (L) 38.9 - 52.0 %    MCV 88.5 78.0 - 100.0 fL    MCH 28.4 26.0 - 32.0 pg    MCHC 32.1 31.0 - 35.5 g/dL    RDW-CV 21.8 (H) 11.5 - 15.5 %    PLATELETS 344 150 - 400 x10^3/uL    MPV 9.6 8.7 - 12.5 fL   BUN   Result Value Ref Range    BUN 10  8 - 25 mg/dL   CREATININE   Result Value Ref Range    CREATININE 0.72 0.62 - 1.27 mg/dL    ESTIMATED GFR >60 >60 mL/min/1.82m2    Narrative    Estimated Glomerular Filtration Rate (eGFR) calculated using the CKD-EPI (2009) equation, intended for patients 139years of age and older. If race and/or gender is not documented or "unknown," there will be no eGFR calculation.   POC BLOOD GLUCOSE (RESULTS)   Result Value Ref Range    GLUCOSE, POC 141 (H) 70 - 105 mg/dl   Results for orders placed or performed during the hospital encounter of 07/01/18 (from the past 150 hour(s))   ALK PHOS (ALKALINE PHOSPHATASE)   Result Value Ref Range    ALKALINE PHOSPHATASE 656 (H) 45 - 115 U/L   ALT (SGPT)   Result Value Ref Range    ALT (SGPT) 103 (H) <55 U/L   AST (SGOT)   Result Value Ref Range    AST (SGOT) 98 (H) 8 - 48 U/L   BILIRUBIN TOTAL   Result Value Ref Range    BILIRUBIN TOTAL 2.2 (H) 0.3 - 1.3 mg/dL   BLOOD CELL COUNT W/DIFF - CANCER CENTER    Narrative    The following orders were created for panel order BLOOD CELL  COUNT W/DIFF - CANCER CENTER.  Procedure                               Abnormality         Status                     ---------                               -----------         ------                     CBC WITH DIFF[[id number]]                Abnormal            Final result                 Please view results for these tests on the individual orders.   CALCIUM   Result Value Ref Range    CALCIUM 9.7 8.5 - 10.2 mg/dL   CREATININE   Result Value Ref Range    CREATININE 0.78 0.62 - 1.27 mg/dL    ESTIMATED GFR >60 >60 mL/min/1.80m2    Narrative    Estimated Glomerular Filtration Rate (eGFR) calculated using the CKD-EPI (2009) equation, intended for patients 124years of age and older. If race and/or gender is not documented or "unknown," there will be no eGFR calculation.   LDH   Result Value Ref Range    LDH 497 (H) 125 - 220 U/L   MAGNESIUM   Result Value Ref Range    MAGNESIUM 1.9 1.6 - 2.6 mg/dL   PHOSPHORUS    Result Value Ref Range    PHOSPHORUS 3.0 2.4 - 4.7 mg/dL   POTASSIUM   Result Value Ref Range    POTASSIUM 4.2 3.5 - 5.1 mmol/L   SODIUM   Result Value Ref Range    SODIUM 136 136 - 145 mmol/L   CBC WITH DIFF   Result Value Ref Range    WBC 21.6 (H) 3.7 - 11.0 x10^3/uL    RBC 3.87 (L) 4.50 - 6.10 x10^6/uL    HGB 10.8 (L) 13.4 - 17.5 g/dL    HCT 34.1 (L) 38.9 - 52.0 %    MCV 88.1 78.0 - 100.0 fL    MCH 27.9 26.0 - 32.0 pg    MCHC 31.7 31.0 - 35.5 g/dL    RDW-CV 19.3 (H) 11.5 - 15.5 %    PLATELETS 256 150 - 400 x10^3/uL    MPV 10.1 8.7 - 12.5 fL   MANUAL DIFF AND MORPHOLOGY-SYSMEX   Result Value Ref Range    NEUTROPHIL % 71 %    LYMPHOCYTE %  14 %    MONOCYTE % 7 %    EOSINOPHIL % 0 %    BASOPHIL % 0 %    NEUTROPHIL BANDS % 1 %    METAMYELOCYTE %  5 %    MYELOCYTE % 2 %    NEUTROPHIL # 15.55 (H) 1.50 - 7.70 x10^3/uL    LYMPHOCYTE # 3.02 1.00 - 4.80 x10^3/uL    MONOCYTE # 1.51 (H) 0.20 - 1.10 x10^3/uL    EOSINOPHIL # <0.10 <=0.50 x10^3/uL    BASOPHIL # <0.10 <=0.20 x10^3/uL    NRBC FROM MANUAL DIFF 3 per 100 WBC  RBC MORPHOLOGY Normal RBC and PLT Morphology      ASSESSMENT: 53 y.o. male with PMH of HTN, diabetes, and HIV positive referred for extensive stage small cell lung cancer in Feb 2020.     PLAN:  - Extensive stage small cell lung cancer.  - We reviewed with the patient prior tissue pathology and imaging studies. We discussed with the patient in length the extent of the disease, prognosis, and the available options for treatment.   - Treatment goals are palliative given metastatic nature of disease.   - Tissue slides have been reviewed at our facility and confirm diagnosis.   - Imaging from outside facility in Feb 2020 showed extensive disease in the chest with metastatic disease to the liver and bones.  - MRI MRCP on Jun 14, 2018 showed extensive metastatic disease throughout the entirety of the liver.   - Patient received palliative radiation to the chest at outside facility which ended in mid-Feb 2020.  -  Received first cycle of chemotherapy in-patient from Feb 22-24.  - The patient presents for C2, however bili elevated today to 5.6. Suspect this is secondary to disease in liver and pt requires continuation of treatment with chemotherapy. Will continue with carboplatin AUC 5 and etoposide 100 mg/m2 (however, will cap BSA at 2). Will recheck Bili tomorrow and if continues to up-trend will consider adjusting etoposide dose at that point.  - Given diarrhea will hold on Atezo at this time and will attempt to start with C3.  - The patient received extensive education (including written material) about the benefits/ risks involved for all the agents in the planned chemotherapy regimen. The patient consented to the current treatment plan, and all the patient's questions were answered to satisfaction.   - Patient tolerated therapy with no new reported side effects.  - Increased WBC count is likely related to neulasta. Patient has no infectious symptoms.   - Has established care with supportive care given stage IV diagnosis.   - RTC in 1 week for symptom check.     - Diarrhea  -The pt states he has had up to 5 loose BMs/day over the last 4 days.  -Will not initiate Atezo yet until diarrhea resolves.  -Will obtain C. diff today.    - Elevated LFTs/bilirubin.  - Related to metastatic disease to the liver.  - Bilirubin elevated again on today's visit to 5.6. Suspect etiology due to extensive disease. Plan to proceed with chemo as above with close monitoring of Bili in next few days.    - Anemia.  - Hgb 11.5 today. Slight decrease since last visit.  - Normocytic.   - Likely related to chemotherapy/malignancy.   - Will continue to monitor and transfuse when symptomatic or Hgb less than 8.0.     - HIV-positive.  - Evaluated by ID while in-patient.  - Continue current medications and follow-up with ID.     - Diabetes.  - HgbA1c 7.7 on Jun 12, 2018.  - Continue current medications.     Annice Needy, MD  PGY6 Hematology-Oncology  Fellow  [facility]    I performed a history and physical exam of the patient and discussed management with the resident. I reviewed the resident's note and agree with the documented findings and plan of care.    Merrie Roof, MD  Associate Professor  Section of Hematology Oncology  [facility] Department of Medicine
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
## 2018-07-07 NOTE — Discharge Instructions (Signed)
Discharge Instructions after Your Port Placement    You have had a port placed.  With the infusaport, medicine is given through a special needle.  This needle is inserted through the chest skin into a surgically implanted dome or port connected to a catheter threaded into a large vein in the upper chest or neck.  Perfect candidates for a port placement are those having frequent treatments.    Some pain and swelling is normal for several days.    Activity  For the next 24 hours:   You may feel sleepy.  Do not drive or operate machinery or power tools.  (You must have someone take you home)   Do not drink any alcoholic beverages   Do not make any important decisions or sign any legal documents   Avoid any pushing or pulling motions and no lifting more than 10 lbs with the affected side.   You may resume normal activity in 1 or 2 days after the procedure.   Avoid heavy contact sports (i.e. Football, hockey, etc.)  Do not leave the port site exposed during contact sports.   Take precautions to protect the port site (i.e. Padding, heavy shirt, etc.)    Care of Wound   Keep area clean, dry, and intact. Allow the Surgical glue to dissolve over time. This can take several weeks.    You may shower. However, keep the area dry. You can use saran wrap to keep the area dry during showers.    Cleanse the site gently with soap and water. Pat area dry.  No lotions or creams.   Do not submerge port site into water.   Keep procedure dressing on for 48-72 hours. Then continue dressing until surgical incision has healed.    Diet   You may resume your normal diet.    Call you doctor if you have:   If bleeding occurs, apply firm pressure to the site.  Keep pressure on the site, and if bleeding persists, call 911 immediately.   Numbness, tingling, or change in color of the arm used for puncture site.   Temperature greater than 101 degrees F.   Redness around the site and warm to touch.   Thick drainage from the  site.   Pain at the insertion site that is not controlled by your medication.   Persistent nausea or vomiting.   Persistent cough or shortness of breath.       Interventional Radiology: [phone]

## 2018-07-07 NOTE — Nurses Notes (Addendum)
1233:  Patient to VAD for lab draw prior to appt/inf. Port placed today and already accessed earlier in IR. Labs drawn and sent. Flushed and Green caps applied. Dressing intact. Patient ambulatory to lobby for appt. Sherril Croon, RN  1440 Pt to infusion for carbo/etoposide. Labs outside of parameters, ok to treat per Dr. Tommy Medal. Orders obtained for c-diff collection. Arther Abbott, RN  [phone] Premedications initiated per [facility]. [name], RN  [phone] 30 minute wait completed.    Carboplatin begun per [facility] via port with good blood return. Chemotherapy policy and procedure followed. Verified during chemotherapy double read that chemotherapy is proper color and consistency, as well as, chemotherapy has not expired.   Chemotherapy reviewed by 2 RN's. Pump rate was visually confirmed with Mindy Rudolph,RN.  Pt has no complaints at this time.  Call bell within reach. Truddie Coco, RN  [phone] - Carboplatin completed. Etoposide infusion started over 1 hour via port with good blood return. Rate, precautions, clarity of drug, and expiration all confirmed. Truddie Coco, RN  Pt given supplies for CDiff collection to take home as we was unable to provide a specimen while he was in infusion. Truddie Coco, RN  1800: Etoposide completed without incidence.  Port was flushed with NSS and left accessed for tomorrows treatment.  Band aid applied to site.  Pt ambulated out of infusion in good condition. Birdie Riddle, RN

## 2018-07-08 ENCOUNTER — Ambulatory Visit: Payer: Self-pay | Admitting: Urology

## 2018-07-08 ENCOUNTER — Encounter (INDEPENDENT_AMBULATORY_CARE_PROVIDER_SITE_OTHER): Payer: Self-pay | Admitting: Internal Medicine

## 2018-07-08 ENCOUNTER — Ambulatory Visit (INDEPENDENT_AMBULATORY_CARE_PROVIDER_SITE_OTHER): Payer: Commercial Managed Care - PPO | Admitting: Internal Medicine

## 2018-07-08 ENCOUNTER — Other Ambulatory Visit: Payer: Self-pay

## 2018-07-08 ENCOUNTER — Inpatient Hospital Stay (HOSPITAL_BASED_OUTPATIENT_CLINIC_OR_DEPARTMENT_OTHER)
Admission: RE | Admit: 2018-07-08 | Discharge: 2018-07-08 | Disposition: A | Discharge status: Still In Hospital | Payer: Commercial Managed Care - PPO | Source: Ambulatory Visit

## 2018-07-08 VITALS — BP 156/84 | HR 84 | Temp 97.1°F | Resp 18 | Ht 69.0 in | Wt 261.5 lb

## 2018-07-08 VITALS — BP 139/83 | HR 84 | Temp 98.4°F | Resp 16 | Ht 69.0 in | Wt 260.8 lb

## 2018-07-08 DIAGNOSIS — C3490 Malignant neoplasm of unspecified part of unspecified bronchus or lung: Secondary | ICD-10-CM

## 2018-07-08 DIAGNOSIS — B20 Human immunodeficiency virus [HIV] disease: Secondary | ICD-10-CM

## 2018-07-08 LAB — ALK PHOS (ALKALINE PHOSPHATASE): ALKALINE PHOSPHATASE: 657 U/L — ABNORMAL HIGH (ref 45–115)

## 2018-07-08 LAB — CLOSTRIDIUM DIFFICILE TOXIN DETECTION: CLOSTRIDIUM DIFFICILE TOXIN DETECTION: NEGATIVE

## 2018-07-08 LAB — AST (SGOT): AST (SGOT): 166 U/L — ABNORMAL HIGH (ref 8–48)

## 2018-07-08 LAB — ALT (SGPT): ALT (SGPT): 221 U/L — ABNORMAL HIGH (ref <55)

## 2018-07-08 LAB — BILIRUBIN TOTAL: BILIRUBIN TOTAL: 6.7 mg/dL — ABNORMAL HIGH (ref 0.3–1.3)

## 2018-07-08 MED ORDER — DEXAMETHASONE 4 MG TABLET
8.0000 mg | ORAL_TABLET | Freq: Once | ORAL | Status: AC
Start: 2018-07-08 — End: 2018-07-08
  Administered 2018-07-08: 8 mg via ORAL
  Filled 2018-07-08: qty 2

## 2018-07-08 MED ORDER — MEPERIDINE (PF) 25 MG/ML INJECTION SYRINGE
12.50 mg | INJECTION | Freq: Once | INTRAMUSCULAR | Status: DC | PRN
Start: 2018-07-08 — End: 2018-07-09

## 2018-07-08 MED ORDER — DIPHENHYDRAMINE 50 MG/ML INJECTION SOLUTION
25.00 mg | Freq: Once | INTRAMUSCULAR | Status: DC | PRN
Start: 2018-07-08 — End: 2018-07-09

## 2018-07-08 MED ORDER — SODIUM CHLORIDE 0.9 % INTRAVENOUS SOLUTION
50.0000 mg/m2 | Freq: Once | INTRAVENOUS | Status: AC
Start: 2018-07-08 — End: 2018-07-08
  Administered 2018-07-08: 100 mg via INTRAVENOUS
  Administered 2018-07-08: 0 mg via INTRAVENOUS
  Filled 2018-07-08: qty 5

## 2018-07-08 MED ORDER — EPINEPHRINE 1 MG/ML (1 ML) INJECTION SOLUTION
0.30 mg | Freq: Once | INTRAMUSCULAR | Status: DC | PRN
Start: 2018-07-08 — End: 2018-07-09

## 2018-07-08 MED ORDER — FAMOTIDINE (PF) 20 MG/2 ML INTRAVENOUS SOLUTION
20.00 mg | Freq: Once | INTRAVENOUS | Status: DC | PRN
Start: 2018-07-08 — End: 2018-07-09

## 2018-07-08 MED ORDER — HYDROCORTISONE SOD SUCCINATE 100 MG/2 ML VIAL WRAPPER
100.00 mg | Freq: Once | INTRAMUSCULAR | Status: DC | PRN
Start: 2018-07-08 — End: 2018-07-09

## 2018-07-08 MED ORDER — ALBUTEROL SULFATE HFA 90 MCG/ACTUATION AEROSOL INHALER - RN
2.00 | Freq: Once | RESPIRATORY_TRACT | Status: DC | PRN
Start: 2018-07-08 — End: 2018-07-09

## 2018-07-08 MED ORDER — DIPHENHYDRAMINE 50 MG/ML INJECTION SOLUTION
50.00 mg | Freq: Once | INTRAMUSCULAR | Status: DC | PRN
Start: 2018-07-08 — End: 2018-07-09

## 2018-07-08 MED ORDER — ALBUTEROL SULFATE 2.5 MG/3 ML (0.083 %) SOLUTION FOR NEBULIZATION
2.50 mg | INHALATION_SOLUTION | Freq: Once | RESPIRATORY_TRACT | Status: DC | PRN
Start: 2018-07-08 — End: 2018-07-09

## 2018-07-08 MED ADMIN — dexAMETHasone 4 mg tablet: ORAL | @ 12:00:00

## 2018-07-08 NOTE — Nurses Notes (Addendum)
1100 pt to infusion room, pt educated. Labs drawn and sent for processing via port that remains accessed from treatment the day before; per team we are not to release treatment until labs are processed. Elray Buba, RN  [phone] labs are not in process, lab states they have not received blood work. Will redraw. Elray Buba, RN  1130 labs redrawn via port and sent to lab for processing. Elray Buba, RN  1140 labs resulted, text page to A.Duckworth.  1142 return call, from A.[name] who states that Dr Carolynn Serve is in another room with another patient. We are still not to release orders at this time. Elray Buba, RN  [phone]- Etoposide will be dose reduced. Dr. Tommy Medal will resign orders. APratt, RN  1213 pt premedicated per mar with decadron, dose reduced etoposide signed and orders released per mar. Pt placed on 30 min wait. Elray Buba, RN  1250- 30 minute wait completed.    Etoposide begun per [facility] via port with good blood return. Chemotherapy policy and procedure followed. Verified during chemotherapy double read that chemotherapy is proper color and consistency, as well as, chemotherapy has not expired.   Chemotherapy reviewed by 2 RN's. Pump rate was visually confirmed with A Lapkowicz,RN.  Pt has no complaints at this time.  Call bell within reach. Elray Buba, RN  [phone] etoposide completed per mar time without incidence. Port + blood return, flushed with saline and remains accessed for day 3 of treatment tomorrow. Green curos cap applied. Pt ambulatory out of infusion in stable condition with no questions or concerns. Elray Buba, RN
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
## 2018-07-09 ENCOUNTER — Inpatient Hospital Stay (HOSPITAL_BASED_OUTPATIENT_CLINIC_OR_DEPARTMENT_OTHER)
Admission: RE | Admit: 2018-07-09 | Discharge: 2018-07-09 | Disposition: A | Discharge status: Still In Hospital | Payer: Commercial Managed Care - PPO | Source: Ambulatory Visit

## 2018-07-09 VITALS — BP 143/84 | HR 93 | Temp 98.0°F | Resp 18 | Ht 69.0 in | Wt 261.9 lb

## 2018-07-09 DIAGNOSIS — C3490 Malignant neoplasm of unspecified part of unspecified bronchus or lung: Secondary | ICD-10-CM

## 2018-07-09 LAB — BILIRUBIN TOTAL: BILIRUBIN TOTAL: 7 mg/dL — ABNORMAL HIGH (ref 0.3–1.3)

## 2018-07-09 LAB — ALK PHOS (ALKALINE PHOSPHATASE): ALKALINE PHOSPHATASE: 599 U/L — ABNORMAL HIGH (ref 45–115)

## 2018-07-09 LAB — AST (SGOT): AST (SGOT): 157 U/L — ABNORMAL HIGH (ref 8–48)

## 2018-07-09 LAB — ALT (SGPT): ALT (SGPT): 205 U/L — ABNORMAL HIGH (ref <55)

## 2018-07-09 MED ORDER — DIPHENHYDRAMINE 50 MG/ML INJECTION SOLUTION
50.00 mg | Freq: Once | INTRAMUSCULAR | Status: DC | PRN
Start: 2018-07-09 — End: 2018-07-10

## 2018-07-09 MED ORDER — DIPHENHYDRAMINE 50 MG/ML INJECTION SOLUTION
25.00 mg | Freq: Once | INTRAMUSCULAR | Status: DC | PRN
Start: 2018-07-09 — End: 2018-07-10

## 2018-07-09 MED ORDER — ALBUTEROL SULFATE HFA 90 MCG/ACTUATION AEROSOL INHALER - RN
2.00 | Freq: Once | RESPIRATORY_TRACT | Status: DC | PRN
Start: 2018-07-09 — End: 2018-07-10

## 2018-07-09 MED ORDER — DEXAMETHASONE 4 MG TABLET
8.00 mg | ORAL_TABLET | Freq: Once | ORAL | Status: DC
Start: 2018-07-09 — End: 2018-07-10
  Administered 2018-07-09 (×2): 0 mg via ORAL

## 2018-07-09 MED ORDER — HYDROCORTISONE SOD SUCCINATE 100 MG/2 ML VIAL WRAPPER
100.00 mg | Freq: Once | INTRAMUSCULAR | Status: DC | PRN
Start: 2018-07-09 — End: 2018-07-10

## 2018-07-09 MED ORDER — ALBUTEROL SULFATE 2.5 MG/3 ML (0.083 %) SOLUTION FOR NEBULIZATION
2.50 mg | INHALATION_SOLUTION | Freq: Once | RESPIRATORY_TRACT | Status: DC | PRN
Start: 2018-07-09 — End: 2018-07-10

## 2018-07-09 MED ORDER — SODIUM CHLORIDE 0.9% FLUSH BAG - 250 ML
INTRAVENOUS | Status: DC | PRN
Start: 2018-07-09 — End: 2018-07-10

## 2018-07-09 MED ORDER — MEPERIDINE (PF) 25 MG/ML INJECTION SYRINGE
12.50 mg | INJECTION | Freq: Once | INTRAMUSCULAR | Status: DC | PRN
Start: 2018-07-09 — End: 2018-07-10

## 2018-07-09 MED ORDER — EPINEPHRINE 1 MG/ML (1 ML) INJECTION SOLUTION
0.30 mg | Freq: Once | INTRAMUSCULAR | Status: DC | PRN
Start: 2018-07-09 — End: 2018-07-10

## 2018-07-09 MED ORDER — FAMOTIDINE (PF) 20 MG/2 ML INTRAVENOUS SOLUTION
20.00 mg | Freq: Once | INTRAVENOUS | Status: DC | PRN
Start: 2018-07-09 — End: 2018-07-10

## 2018-07-09 NOTE — Nurses Notes (Addendum)
1539 Pt to Infusion area for day three of VP16 and labs. Lab orders released, no parameters noted. VP16 needs signed. Paging Dr. Carolynn Serve. Francee Gentile RN  [phone] Dr. Carolynn Serve wants to see Bili result prior to proceeding with tx. Francee Gentile RN  [phone]: Pt to infusion room 8 for treatment. Labs drawn via port. Port flushed with 75mL of NS. Plan of care reviewed with pt. Call bell within reach. Fontaine No, [place]: Labs returning.   Results for SEICHI, KAUFHOLD (MRN [id number]) as of 07/09/2018 16:33   Ref. Range 07/09/2018 15:36   BILIRUBIN, TOTAL Latest Ref Range: 0.3 - 1.3 mg/dL 7.0 (H)   AST (SGOT) Latest Ref Range: 8 - 48 U/L 157 (H)   ALT (SGPT) Latest Ref Range: <55 U/L 205 (H)   ALKALINE PHOSPHATASE Latest Ref Range: 45 - 115 U/L 599 (H)   Dr. Carolynn Serve. Fontaine No, RN  [phone]: Per Dr. Carolynn Serve, hold Etoposide today. Dr. Carolynn Serve wanting for pt to come in for lab work tomorrow and will place orders. [place]: Port flushed with 30mL of NS and deaccessed per pt request. Gauze and transparent dressing applied to site. Pt ambulatory to check out desk to make appointment for tomorrow. Fontaine No, RN

## 2018-07-10 ENCOUNTER — Ambulatory Visit
Admission: RE | Admit: 2018-07-10 | Discharge: 2018-07-10 | Disposition: A | Discharge status: Home / Self Care | Payer: Commercial Managed Care - PPO | Source: Ambulatory Visit | Attending: General Practice | Admitting: General Practice

## 2018-07-10 DIAGNOSIS — Z21 Asymptomatic human immunodeficiency virus [HIV] infection status: Secondary | ICD-10-CM | POA: Insufficient documentation

## 2018-07-10 DIAGNOSIS — C3490 Malignant neoplasm of unspecified part of unspecified bronchus or lung: Secondary | ICD-10-CM | POA: Insufficient documentation

## 2018-07-10 DIAGNOSIS — R822 Biliuria: Secondary | ICD-10-CM | POA: Insufficient documentation

## 2018-07-10 LAB — ALT (SGPT): ALT (SGPT): 196 U/L — ABNORMAL HIGH (ref <55)

## 2018-07-10 LAB — AST (SGOT): AST (SGOT): 162 U/L — ABNORMAL HIGH (ref 8–48)

## 2018-07-10 LAB — BILIRUBIN, TOTAL/CONJ
BILIRUBIN DIRECT: 5.8 mg/dL — ABNORMAL HIGH (ref <0.3)
BILIRUBIN TOTAL: 7.8 mg/dL — ABNORMAL HIGH (ref 0.3–1.3)

## 2018-07-10 NOTE — Nurses Notes (Signed)
1000- Pt to VAD for labs. Pt requested butterfly stick instead of port access. Labs obtained and sent for processing. Pt waiting in lobby for results; service notified and will be reviewing labs. Silvana Newness, RN

## 2018-07-10 NOTE — Progress Notes (Signed)
Dr. Carolynn Serve and I discussed lab results from today with patient. We suspect that his increasing bilirubin is due to extensive liver metastases given no other obvious explanation. Discussed options which include outpatient lab monitoring vs admission. Patient prefers to remain outpatient. Discussed with infectious disease, and they will get back to [place] about possibility of holding HIV medications in case this is contributing to increased bilirubin.    Dorise Hiss, MSN, APRN, FNP-C

## 2018-07-13 ENCOUNTER — Encounter (FREE_STANDING_LABORATORY_FACILITY): Payer: Commercial Managed Care - PPO | Admitting: General Practice

## 2018-07-13 ENCOUNTER — Other Ambulatory Visit (INDEPENDENT_AMBULATORY_CARE_PROVIDER_SITE_OTHER): Payer: Commercial Managed Care - PPO

## 2018-07-13 ENCOUNTER — Encounter (FREE_STANDING_LABORATORY_FACILITY)
Admit: 2018-07-13 | Discharge: 2018-07-13 | Disposition: A | Discharge status: Home / Self Care | Payer: Commercial Managed Care - PPO | Attending: General Practice | Admitting: General Practice

## 2018-07-13 DIAGNOSIS — C3490 Malignant neoplasm of unspecified part of unspecified bronchus or lung: Secondary | ICD-10-CM

## 2018-07-13 LAB — CBC WITH DIFF
BASOPHIL #: 0.13 x10ˆ3/uL (ref <=0.20)
BASOPHIL %: 1 %
EOSINOPHIL #: <0.1 x10ˆ3/uL (ref <=0.50)
EOSINOPHIL %: 0 %
HCT: 35.3 % — ABNORMAL LOW (ref 38.9–52.0)
HGB: 11.1 g/dL — ABNORMAL LOW (ref 13.4–17.5)
IMMATURE GRANULOCYTE #: 0.13 x10ˆ3/uL — ABNORMAL HIGH (ref <0.10)
IMMATURE GRANULOCYTE %: 1 % (ref 0–1)
LYMPHOCYTE #: 2.3 x10ˆ3/uL (ref 1.00–4.80)
LYMPHOCYTE %: 17 %
MCH: 28.6 pg (ref 26.0–32.0)
MCHC: 31.4 g/dL (ref 31.0–35.5)
MCV: 91 fL (ref 78.0–100.0)
MONOCYTE #: 0.47 x10ˆ3/uL (ref 0.20–1.10)
MONOCYTE %: 4 %
MPV: 10.5 fL (ref 8.7–12.5)
MPV: 10.5 fL (ref 8.7–12.5)
NEUTROPHIL #: 10.17 x10ˆ3/uL — ABNORMAL HIGH (ref 1.50–7.70)
NEUTROPHIL %: 77 %
PLATELETS: 283 x10ˆ3/uL (ref 150–400)
RBC: 3.88 x10ˆ6/uL — ABNORMAL LOW (ref 4.50–6.10)
RDW-CV: 21.8 % — ABNORMAL HIGH (ref 11.5–15.5)
RDW-CV: 21.8 % — ABNORMAL HIGH (ref 11.5–15.5)
WBC: 13.2 x10ˆ3/uL — ABNORMAL HIGH (ref 3.7–11.0)

## 2018-07-13 LAB — CALCIUM: CALCIUM: 8.7 mg/dL (ref 8.5–10.2)

## 2018-07-13 LAB — POTASSIUM: POTASSIUM: 3.7 mmol/L (ref 3.5–5.1)

## 2018-07-13 LAB — ALK PHOS (ALKALINE PHOSPHATASE): ALKALINE PHOSPHATASE: 780 U/L — ABNORMAL HIGH (ref 45–115)

## 2018-07-13 LAB — PHOSPHORUS: PHOSPHORUS: 2.1 mg/dL — ABNORMAL LOW (ref 2.4–4.7)

## 2018-07-13 LAB — SODIUM: SODIUM: 135 mmol/L — ABNORMAL LOW (ref 136–145)

## 2018-07-13 LAB — BILIRUBIN TOTAL: BILIRUBIN TOTAL: 5.9 mg/dL — ABNORMAL HIGH (ref 0.3–1.3)

## 2018-07-13 LAB — CREATININE WITH EGFR
CREATININE: 0.67 mg/dL (ref 0.62–1.27)
ESTIMATED GFR: >60 mL/min/1.73mˆ2 (ref >60)

## 2018-07-13 LAB — AST (SGOT): AST (SGOT): 146 U/L — ABNORMAL HIGH (ref 8–48)

## 2018-07-13 LAB — LDH: LDH: 643 U/L — ABNORMAL HIGH (ref 125–220)

## 2018-07-13 LAB — ALT (SGPT): ALT (SGPT): 184 U/L — ABNORMAL HIGH (ref <55)

## 2018-07-13 LAB — MAGNESIUM: MAGNESIUM: 2.1 mg/dL (ref 1.6–2.6)

## 2018-07-15 ENCOUNTER — Ambulatory Visit (HOSPITAL_BASED_OUTPATIENT_CLINIC_OR_DEPARTMENT_OTHER): Payer: Commercial Managed Care - PPO | Admitting: General Practice

## 2018-07-15 ENCOUNTER — Ambulatory Visit
Admission: RE | Admit: 2018-07-15 | Discharge: 2018-07-15 | Disposition: A | Discharge status: Home / Self Care | Payer: Commercial Managed Care - PPO | Source: Ambulatory Visit | Attending: General Practice | Admitting: General Practice

## 2018-07-15 ENCOUNTER — Encounter (HOSPITAL_BASED_OUTPATIENT_CLINIC_OR_DEPARTMENT_OTHER): Payer: Self-pay | Admitting: General Practice

## 2018-07-15 VITALS — BP 162/82 | HR 102 | Temp 97.9°F | Resp 18 | Ht 69.0 in | Wt 258.6 lb

## 2018-07-15 DIAGNOSIS — Z21 Asymptomatic human immunodeficiency virus [HIV] infection status: Secondary | ICD-10-CM | POA: Insufficient documentation

## 2018-07-15 DIAGNOSIS — Z79899 Other long term (current) drug therapy: Secondary | ICD-10-CM | POA: Insufficient documentation

## 2018-07-15 DIAGNOSIS — C3490 Malignant neoplasm of unspecified part of unspecified bronchus or lung: Secondary | ICD-10-CM

## 2018-07-15 DIAGNOSIS — E119 Type 2 diabetes mellitus without complications: Secondary | ICD-10-CM

## 2018-07-15 DIAGNOSIS — I10 Essential (primary) hypertension: Secondary | ICD-10-CM | POA: Insufficient documentation

## 2018-07-15 DIAGNOSIS — R197 Diarrhea, unspecified: Secondary | ICD-10-CM | POA: Insufficient documentation

## 2018-07-15 DIAGNOSIS — C787 Secondary malignant neoplasm of liver and intrahepatic bile duct: Secondary | ICD-10-CM | POA: Insufficient documentation

## 2018-07-15 DIAGNOSIS — D649 Anemia, unspecified: Secondary | ICD-10-CM | POA: Insufficient documentation

## 2018-07-15 DIAGNOSIS — C7951 Secondary malignant neoplasm of bone: Secondary | ICD-10-CM | POA: Insufficient documentation

## 2018-07-15 DIAGNOSIS — Z7982 Long term (current) use of aspirin: Secondary | ICD-10-CM | POA: Insufficient documentation

## 2018-07-15 DIAGNOSIS — R17 Unspecified jaundice: Secondary | ICD-10-CM

## 2018-07-15 DIAGNOSIS — Z7984 Long term (current) use of oral hypoglycemic drugs: Secondary | ICD-10-CM | POA: Insufficient documentation

## 2018-07-15 DIAGNOSIS — Z7952 Long term (current) use of systemic steroids: Secondary | ICD-10-CM | POA: Insufficient documentation

## 2018-07-15 LAB — ALT (SGPT): ALT (SGPT): 186 U/L — ABNORMAL HIGH (ref <55)

## 2018-07-15 LAB — CBC WITH DIFF
HCT: 34.1 % — ABNORMAL LOW (ref 38.9–52.0)
HGB: 10.9 g/dL — ABNORMAL LOW (ref 13.4–17.5)
MCH: 29.2 pg (ref 26.0–32.0)
MCHC: 32 g/dL (ref 31.0–35.5)
MCV: 91.4 fL (ref 78.0–100.0)
MPV: 9.7 fL (ref 8.7–12.5)
PLATELETS: 233 x10?3/uL (ref 150–400)
PLATELETS: 233 x10ˆ3/uL (ref 150–400)
RBC: 3.73 x10ˆ6/uL — ABNORMAL LOW (ref 4.50–6.10)
RDW-CV: 22.5 % — ABNORMAL HIGH (ref 11.5–15.5)
WBC: 10.5 x10ˆ3/uL (ref 3.7–11.0)

## 2018-07-15 LAB — MANUAL DIFF AND MORPHOLOGY-SYSMEX
BASOPHIL #: <0.1 x10ˆ3/uL (ref <=0.20)
BASOPHIL %: 0 %
EOSINOPHIL #: 0.21 x10ˆ3/uL (ref <=0.50)
EOSINOPHIL %: 2 %
LYMPHOCYTE #: 2.1 x10ˆ3/uL (ref 1.00–4.80)
LYMPHOCYTE %: 20 %
METAMYELOCYTE %: 1 %
MONOCYTE #: 0.63 x10ˆ3/uL (ref 0.20–1.10)
MONOCYTE %: 6 %
NEUTROPHIL #: 7.46 x10ˆ3/uL (ref 1.50–7.70)
NEUTROPHIL %: 71 %
NRBC FROM MANUAL DIFF: 0 per 100 WBC

## 2018-07-15 LAB — PLATELETS AND ANC CANCER CENTER
NEUTROPHILS #: 7.57 x10ˆ3/uL (ref 1.50–7.70)
PLATELET COUNT: 233 x10ˆ3/uL (ref 150–400)

## 2018-07-15 LAB — PHOSPHORUS: PHOSPHORUS: 2.1 mg/dL — ABNORMAL LOW (ref 2.4–4.7)

## 2018-07-15 LAB — CREATININE WITH EGFR
CREATININE: 0.7 mg/dL (ref 0.62–1.27)
ESTIMATED GFR: >60 mL/min/1.73mˆ2 (ref >60)

## 2018-07-15 LAB — BILIRUBIN TOTAL: BILIRUBIN TOTAL: 3.9 mg/dL — ABNORMAL HIGH (ref 0.3–1.3)

## 2018-07-15 LAB — AST (SGOT): AST (SGOT): 122 U/L — ABNORMAL HIGH (ref 8–48)

## 2018-07-15 LAB — SODIUM: SODIUM: 135 mmol/L — ABNORMAL LOW (ref 136–145)

## 2018-07-15 LAB — CALCIUM: CALCIUM: 9 mg/dL (ref 8.5–10.2)

## 2018-07-15 LAB — MAGNESIUM: MAGNESIUM: 2.1 mg/dL (ref 1.6–2.6)

## 2018-07-15 LAB — LDH: LDH: 568 U/L — ABNORMAL HIGH (ref 125–220)

## 2018-07-15 LAB — ALK PHOS (ALKALINE PHOSPHATASE): ALKALINE PHOSPHATASE: 701 U/L — ABNORMAL HIGH (ref 45–115)

## 2018-07-15 LAB — POTASSIUM: POTASSIUM: 3.7 mmol/L (ref 3.5–5.1)

## 2018-07-15 NOTE — Cancer Center Note (Signed)
[facility]       CANCER CENTER NOTE     Date: 07/15/2018  Name: Thomas Barry  MRN: [id number]  Referring Physician: Merrie Roof, MD  Primary Care Provider: No Pcp    REASON FOR VISIT:52 y.o.male from [place] [id number] for evaluation and management of extensive stage small cell lung cancer.    HISTORY OF PRESENT ILLNESS:   - Early Feb 2020. Patient developed increased dyspnea on exertion. He presented to outside facility in [place] where he was living at the time. He was found to have extensive disease in the chest with metastatic disease to the liver and bones. He was started on palliative radiation to the chest prior to pathology being completed.   - Jun 01, 2018. Liver biopsy completed. Pathology showed small cell carcinoma.   - Mid-Feb 2020. Completed palliative radiation to the chest.   - Feb 21-26, 2020. Initially presented to [facility] to establish care. Admitted to [facility] to start palliative chemotherapy with carboplatin AUC 5 IV on day 1 and etoposide 100 mg/m2 IV on days 1, 2, and 3 of 21 day cycle. Staging PET CT was declined by patient's insurance. MRI MRCP showed extensive metastatic disease throughout the entirety of the liver. Patient received chemotherapy and was discharged when stable.     Thomas Barry is 53 y.o. male here today for follow-up.   Patient is feeling a little better today. No new issues since last visit. Says weakness has improved a little bit after chemotherapy.     REVIEW OF SYSTEMS:  General: (-) pain. (-) fevers (-) chills. (-) weight loss. (+) fatigue.  Lymphatic: (-) palpable masses. (-) night sweats.  Heme: (-) easy bruising (-) bleeding.  (-) recurrent infections.   HEENT. (-) vision changes (-) hearing changes. (-) dysphagia. (-) sore throat.   Heart: (-) chest pain. (-) palpitation. (-) orthopnea. (-) LE edema.   Lungs: (-) dyspnea (on exertion) (-) hemoptysis. (-) cough.   Abdomen: (-) poor appetite. (-) abdominal pain. (-)  nausea (-) vomiting. (+) diarrhea. (-) constipation.   GU: (-) dysuria (-) Urgency. (-) Hematuria.   MS. (-) joint pain (-) ext swelling. (-) Back pain.    Dermatologic: (-) rashes. (-) pruritus.   Psychiatric: (-) Depression. (-) anxiety. (-) insomnia.   Neurologic: (-) headaches. (-) neuropathy. (-) weakness. (-) memory problems.  Other review of systems negative.     PAST MEDICAL HISTORY:  Past Medical History:   Diagnosis Date   . Diabetes (CMS [place])    . HIV positive (CMS [place]) 06/13/2018   . HTN (hypertension)    . Small cell lung cancer (CMS HCC)      MEDICATIONS:  Current Outpatient Medications   Medication Sig   . aspirin (ECOTRIN) 81 mg Oral Tablet, Delayed Release (E.C.) Take 81 mg by mouth Once a day   . atovaquone (MEPRON) 750 mg/5 mL Oral Suspension Take 10 mL (1,500 mg total) by mouth Every morning with breakfast   . benzonatate (TESSALON) 100 mg Oral Capsule Take 100 mg by mouth Three times a day   . buPROPion (WELLBUTRIN XL) 300 mg extended release 24 hr tablet Take 300 mg by mouth Once a day   . dolutegravir (TIVICAY) tablet Take 50 mg by mouth Once a day   . empagliflozin (JARDIANCE) 25 mg Oral Tablet Take 25 mg by mouth Once a day   . emtricitabine-tenofovir alafen (DESCOVY) 200-25 mg Oral  Tablet Take 1 Tab by mouth Once a day   . MetFORMIN (GLUCOPHAGE) 1,000 mg Oral Tablet Take 1,000 mg by mouth Twice daily with food   . metoprolol tartrate (LOPRESSOR) 25 mg Oral Tablet Take 25 mg by mouth Twice daily   . ondansetron (ZOFRAN) 4 mg Oral Tablet Take 4 mg by mouth Every 8 hours as needed for Nausea/Vomiting   . polyethylene glycol 3350 (MIRALAX ORAL) Take by mouth Once a day   . promethazine (PHENERGAN) 25 mg Oral Tablet Take 25 mg by mouth Every 8 hours as needed for Nausea/Vomiting   . sennosides (SENOKOT) 8.6 mg Oral Tablet Take 1 Tab (8.6 mg total) by mouth Twice per day as needed for up to 30 days   . sertraline (ZOLOFT) 100 mg Oral Tablet Take 100 mg by mouth Once a day   . sodium chloride 1  gram Oral Tablet Take 1 g by mouth Three times daily with meals 2 tabs   . triamcinolone acetonide (ARISTOCORT A) 0.1 % Ointment by Apply Topically route Twice per day as needed   . trimethoprim-sulfamethoxazole (BACTRIM DS) 160-877m per tablet Take 1 Tab (160 mg total) by mouth Once a day for 90 days (Patient not taking: Reported on 07/15/2018)     ORAL CHEMO ASSESSMENT:  Oral Chemotherapy Assessment  Currently prescribed any oral chemotherapy medication(s)?: No (07/15/18 1100)    ALLERGIES:  Allergies   Allergen Reactions   . Sulfa (Sulfonamides) Rash and Itching     PHYSICAL EXAMINATION:  Vitals:  Temperature  36.6 C (97.9 F) filed at... 07/15/2018 1254   Heart Rate  (!) 102 filed at... 07/15/2018 1254   Respiratory Rate  18 filed at... 07/15/2018 1254   BP (Non-Invasive)  (!) 162/82 filed at... 07/15/2018 1254   SpO2  --   Height  1.753 m ('5\' 9"' ) filed at... 07/15/2018 1254   Weight  117.3 kg (258 lb 9.6 oz) filed at... 07/15/2018 1254   BMI (Calculated)  38.27 filed at... 07/15/2018 1254   BSA (Calculated)  2.39 filed at... 07/15/2018 1254   ECOG PS 1-2.  General General: Appearance is around recent baseline. Pleasant. No acute distress.   Eyes: Conjunctiva clear. Pupils equal and round. Sclera icteric.   HENT:ENT without erythema or injection, mucous membranes moist. Nose without erythema. Pharynx without injection or exudate.   Neck: No JVD or thyromegaly or lymphadenopathy and supple, symmetrical, trachea midline  Lungs: clear to auscultation bilaterally.   Cardiovascular: Heart regular rate and rhythm. S1, S2 normal  Abdomen: soft, non-tender, bowel sounds normal.  Extremities: no cyanosis or edema.   Skin: Skin color, texture, turgor normal. No visible rashes or lesions.   Neurologic: grossly normal, gait is normal and no tremor  Lymphatics: no lymphadenopathy and cervical, supraclavicular, and axillary nodes normal.   Psychiatric: Normal affect, behavior, and mood. Alert and oriented x 3.      LABORATORY:  Results for orders placed or performed during the hospital encounter of 07/15/18 (from the past 48 hour(s))   ALK PHOS (ALKALINE PHOSPHATASE)   Result Value Ref Range    ALKALINE PHOSPHATASE 701 (H) 45 - 115 U/L   ALT (SGPT)   Result Value Ref Range    ALT (SGPT) 186 (H) <55 U/L   AST (SGOT)   Result Value Ref Range    AST (SGOT) 122 (H) 8 - 48 U/L   BILIRUBIN TOTAL   Result Value Ref Range    BILIRUBIN TOTAL 3.9 (H) 0.3 - 1.3  mg/dL   BLOOD CELL COUNT W/DIFF - CANCER CENTER    Narrative    The following orders were created for panel order BLOOD CELL COUNT W/DIFF - CANCER CENTER.  Procedure                               Abnormality         Status                     ---------                               -----------         ------                     CBC WITH DIFF[[id number]]                Abnormal            Final result               PLATELETS AND ANC CANCER.Marland KitchenMarland Kitchen[[id number]]  Normal              Final result                 Please view results for these tests on the individual orders.   CALCIUM   Result Value Ref Range    CALCIUM 9.0 8.5 - 10.2 mg/dL   CREATININE   Result Value Ref Range    CREATININE 0.70 0.62 - 1.27 mg/dL    ESTIMATED GFR >60 >60 mL/min/1.2m2    Narrative    Estimated Glomerular Filtration Rate (eGFR) calculated using the CKD-EPI (2009) equation, intended for patients 140years of age and older. If race and/or gender is not documented or "unknown," there will be no eGFR calculation.   LDH   Result Value Ref Range    LDH 568 (H) 125 - 220 U/L   MAGNESIUM   Result Value Ref Range    MAGNESIUM 2.1 1.6 - 2.6 mg/dL   PHOSPHORUS   Result Value Ref Range    PHOSPHORUS 2.1 (L) 2.4 - 4.7 mg/dL   POTASSIUM   Result Value Ref Range    POTASSIUM 3.7 3.5 - 5.1 mmol/L   SODIUM   Result Value Ref Range    SODIUM 135 (L) 136 - 145 mmol/L   CBC WITH DIFF   Result Value Ref Range    WBC 10.5 3.7 - 11.0 x10^3/uL    RBC 3.73 (L) 4.50 - 6.10 x10^6/uL    HGB 10.9 (L) 13.4 - 17.5 g/dL    HCT 34.1 (L)  38.9 - 52.0 %    MCV 91.4 78.0 - 100.0 fL    MCH 29.2 26.0 - 32.0 pg    MCHC 32.0 31.0 - 35.5 g/dL    RDW-CV 22.5 (H) 11.5 - 15.5 %    PLATELETS 233 150 - 400 x10^3/uL    MPV 9.7 8.7 - 12.5 fL   PLATELETS AND ANC CANCER CENTER   Result Value Ref Range    NEUTROPHILS # 7.57 1.50 - 7.70 x10^3/uL    PLATELET COUNT 233 150 - 400 x10^3/uL   MANUAL DIFF AND MORPHOLOGY-SYSMEX   Result Value Ref Range    NEUTROPHIL % 71 %    LYMPHOCYTE %  20 %    MONOCYTE % 6 %  EOSINOPHIL % 2 %    BASOPHIL % 0 %    METAMYELOCYTE %  1 %    NEUTROPHIL # 7.46 1.50 - 7.70 x10^3/uL    LYMPHOCYTE # 2.10 1.00 - 4.80 x10^3/uL    MONOCYTE # 0.63 0.20 - 1.10 x10^3/uL    EOSINOPHIL # 0.21 <=0.50 x10^3/uL    BASOPHIL # <0.10 <=0.20 x10^3/uL    NRBC FROM MANUAL DIFF 0 per 100 WBC    TARGET CELLS 2+/Moderate (A) None     ASSESSMENT: 53 y.o. male with PMH of HTN, diabetes, and HIV positive referred for extensive stage small cell lung cancer in Feb 2020.     PLAN:  - Extensive stage small cell lung cancer.  - We reviewed with the patient prior tissue pathology and imaging studies. We discussed with the patient in length the extent of the disease, prognosis, and the available options for treatment.   - Treatment goals are palliative given metastatic nature of disease.   - Tissue slides have been reviewed at our facility and confirm diagnosis.   - Imaging from outside facility in Feb 2020 showed extensive disease in the chest with metastatic disease to the liver and bones.  - MRI MRCP on Jun 14, 2018 showed extensive metastatic disease throughout the entirety of the liver.   - Patient received palliative radiation to the chest at outside facility which ended in mid-Feb 2020.  - Received first cycle of chemotherapy in-patient from Feb 22-24.  - Bilirubin increased throughout cycle 2 last week, ultimately leading to etoposide being held on day three after dose adjustment on day 2. Decision was made to hold atezolizumab given increased bilirubin and  diarrhea.   - Bilirubin is trending down now.   - Side effects from malignancy/therapy appear to be improving.   - Patient tolerated therapy with no new reported side effects.  - Has established care with supportive care given stage IV diagnosis.   - Labs in 1 week.  - RTC in 2 weeks.     - Diarrhea.  - C-diff negative.  - Does not report today.  - Will monitor for recurrence and attempt to start immunotherapy with next cycle if remains resolved.     - Elevated LFTs/bilirubin.  - Related to metastatic disease to the liver.  - Bilirubin improved to 3.9 today.     - Anemia.  - Hgb 10.9 today. Slight decrease since last visit.  - Normocytic.   - Likely related to chemotherapy/malignancy.   - Will continue to monitor and transfuse when symptomatic or Hgb less than 8.0.     - HIV-positive.  - Evaluated by ID while in-patient.  - Continue current medications and follow-up with ID.     - Diabetes.  - HgbA1c 7.7 on Jun 12, 2018.  - Continue current medications.     Dorise Hiss, MSN, APRN, FNP-C    I personally saw and examined the patient.  I agree with the findings and plan of care as documented in the note.  See mid-level's note for additional details.   I reviewed with the patient current symptoms. Discussed lab/ imaging findings and their clinical significance. I discussed with the patient about the status of the disease. Will continue active follow up.     Merrie Roof, MD  Associate Professor  Section of Hematology Oncology  [facility] Department of Medicine

## 2018-07-16 ENCOUNTER — Encounter (HOSPITAL_BASED_OUTPATIENT_CLINIC_OR_DEPARTMENT_OTHER): Payer: Commercial Managed Care - PPO | Admitting: Geriatric Medicine

## 2018-07-16 ENCOUNTER — Encounter (HOSPITAL_BASED_OUTPATIENT_CLINIC_OR_DEPARTMENT_OTHER): Payer: Self-pay | Admitting: Geriatric Medicine

## 2018-07-16 ENCOUNTER — Other Ambulatory Visit (HOSPITAL_BASED_OUTPATIENT_CLINIC_OR_DEPARTMENT_OTHER): Payer: Self-pay | Admitting: Geriatric Medicine

## 2018-07-16 ENCOUNTER — Telehealth (HOSPITAL_BASED_OUTPATIENT_CLINIC_OR_DEPARTMENT_OTHER): Payer: Self-pay | Admitting: Geriatric Medicine

## 2018-07-16 DIAGNOSIS — C3490 Malignant neoplasm of unspecified part of unspecified bronchus or lung: Secondary | ICD-10-CM

## 2018-07-16 DIAGNOSIS — Z515 Encounter for palliative care: Secondary | ICD-10-CM

## 2018-07-16 DIAGNOSIS — G893 Neoplasm related pain (acute) (chronic): Secondary | ICD-10-CM

## 2018-07-16 MED ORDER — OXYCODONE 5 MG TABLET
5.00 mg | ORAL_TABLET | ORAL | 0 refills | Status: DC | PRN
Start: 2018-07-16 — End: 2018-07-29

## 2018-07-16 NOTE — Telephone Encounter (Signed)
-----   Message from Levora Angel, RN sent at 07/16/2018  9:31 AM EDT -----  Regarding: FW: Prescription Question  Contact: [phone]    ----- Message -----  From: Thomas Barry  Sent: 07/16/2018   7:56 AM EDT  To: [facility] Nurse Triage Pool  Subject: Prescription Question                            Hello.        I wanted to let you know that the prescription for the 5mg  oxycodone.  I think that is what was prescribed is working very well to come from the pain and request a refill be cAlled in to [facility].        If you have any questions please let me know. I can be reached at [phone]

## 2018-07-16 NOTE — Telephone Encounter (Signed)
Refill for oxycodone 2.5 - 5mg  q4h PRN sent.       Pennie Banter, MD

## 2018-07-16 NOTE — Progress Notes (Signed)
07/16/2018     I have spent an extensive amount of non-face to face time totaling 30 minutes.  This time consisted of the following tasks:    - chart review: concern for T bili elevation, LFTs with a downtrend. ID following for HIV meds adjustment. Patient has initiated chemo, Port placed by [employer], evaluated by dermatology.  - I called the patient: asymptomatic from a liver standpoint, diarrhea has improved, rash improved. He is using up to 3 full pills of oxycodone 5mg  a day. He uses appropriately, half for mild, full for severe pain. No drowsiness reported, tolerating well.  - CSAPP reviewed, appropriate, Ref [id number]  - Refill sent to preferred pharmacy.    The service is applicable and necessary in order to effectively manage the patients care.    Pennie Banter, MD

## 2018-07-22 ENCOUNTER — Other Ambulatory Visit: Payer: Self-pay

## 2018-07-22 ENCOUNTER — Encounter (FREE_STANDING_LABORATORY_FACILITY)
Admit: 2018-07-22 | Discharge: 2018-07-22 | Disposition: A | Discharge status: Home / Self Care | Payer: Commercial Managed Care - PPO | Attending: General Practice | Admitting: General Practice

## 2018-07-22 ENCOUNTER — Encounter (INDEPENDENT_AMBULATORY_CARE_PROVIDER_SITE_OTHER): Payer: Self-pay

## 2018-07-22 ENCOUNTER — Encounter (FREE_STANDING_LABORATORY_FACILITY): Payer: Commercial Managed Care - PPO | Admitting: General Practice

## 2018-07-22 ENCOUNTER — Other Ambulatory Visit (INDEPENDENT_AMBULATORY_CARE_PROVIDER_SITE_OTHER): Payer: Commercial Managed Care - PPO

## 2018-07-22 ENCOUNTER — Encounter (HOSPITAL_BASED_OUTPATIENT_CLINIC_OR_DEPARTMENT_OTHER): Payer: Self-pay | Admitting: Mental Health

## 2018-07-22 DIAGNOSIS — C3490 Malignant neoplasm of unspecified part of unspecified bronchus or lung: Principal | ICD-10-CM

## 2018-07-22 LAB — CBC WITH DIFF
BASOPHIL #: 0.15 x10ˆ3/uL (ref <=0.20)
BASOPHIL %: 1 %
EOSINOPHIL #: <0.1 x10ˆ3/uL (ref <=0.50)
EOSINOPHIL %: 0 %
HCT: 37.7 % — ABNORMAL LOW (ref 38.9–52.0)
HGB: 12.2 g/dL — ABNORMAL LOW (ref 13.4–17.5)
IMMATURE GRANULOCYTE #: <0.1 10*3/uL (ref <0.10)
IMMATURE GRANULOCYTE %: 1 % (ref 0–1)
LYMPHOCYTE #: 3.11 10*3/uL (ref 1.00–4.80)
LYMPHOCYTE %: 22 %
MCH: 29.9 pg (ref 26.0–32.0)
MCHC: 32.4 g/dL (ref 31.0–35.5)
MCV: 92.4 fL (ref 78.0–100.0)
MONOCYTE #: 1.01 10*3/uL (ref 0.20–1.10)
MONOCYTE %: 7 %
MPV: 10.5 fL (ref 8.7–12.5)
NEUTROPHIL #: 9.58 10*3/uL — ABNORMAL HIGH (ref 1.50–7.70)
NEUTROPHIL %: 69 %
PLATELETS: 349 x10ˆ3/uL (ref 150–400)
RBC: 4.08 10*6/uL — ABNORMAL LOW (ref 4.50–6.10)
RDW-CV: 22.3 % — ABNORMAL HIGH (ref 11.5–15.5)
WBC: 14 x10ˆ3/uL — ABNORMAL HIGH (ref 3.7–11.0)

## 2018-07-22 LAB — BILIRUBIN TOTAL: BILIRUBIN TOTAL: 5.9 mg/dL — ABNORMAL HIGH (ref 0.3–1.3)

## 2018-07-22 LAB — CALCIUM: CALCIUM: 9.4 mg/dL (ref 8.5–10.2)

## 2018-07-22 LAB — CREATININE WITH EGFR
CREATININE: 0.75 mg/dL (ref 0.62–1.27)
ESTIMATED GFR: >60 mL/min/1.73mˆ2 (ref >60)

## 2018-07-22 LAB — POTASSIUM: POTASSIUM: 3.7 mmol/L (ref 3.5–5.1)

## 2018-07-22 LAB — ALK PHOS (ALKALINE PHOSPHATASE): ALKALINE PHOSPHATASE: 584 U/L — ABNORMAL HIGH (ref 45–115)

## 2018-07-22 LAB — ALT (SGPT): ALT (SGPT): 152 U/L — ABNORMAL HIGH (ref <55)

## 2018-07-22 LAB — THYROID STIMULATING HORMONE (SENSITIVE TSH): TSH: 0.053 u[IU]/mL — ABNORMAL LOW (ref 0.350–5.000)

## 2018-07-22 LAB — LDH: LDH: 600 U/L — ABNORMAL HIGH (ref 125–220)

## 2018-07-22 LAB — AST (SGOT): AST (SGOT): 104 U/L — ABNORMAL HIGH (ref 8–48)

## 2018-07-22 LAB — PHOSPHORUS: PHOSPHORUS: 2.6 mg/dL (ref 2.4–4.7)

## 2018-07-22 LAB — MAGNESIUM: MAGNESIUM: 1.9 mg/dL (ref 1.6–2.6)

## 2018-07-22 LAB — SODIUM: SODIUM: 136 mmol/L (ref 136–145)

## 2018-07-23 ENCOUNTER — Telehealth (HOSPITAL_BASED_OUTPATIENT_CLINIC_OR_DEPARTMENT_OTHER): Payer: Self-pay | Admitting: General Practice

## 2018-07-23 ENCOUNTER — Encounter (HOSPITAL_BASED_OUTPATIENT_CLINIC_OR_DEPARTMENT_OTHER): Payer: Self-pay | Admitting: General Practice

## 2018-07-23 MED ORDER — FUROSEMIDE 20 MG TABLET
20.00 mg | ORAL_TABLET | Freq: Every day | ORAL | 0 refills | Status: AC | PRN
Start: 2018-07-23 — End: ?

## 2018-07-23 NOTE — Telephone Encounter (Signed)
Patient Phone Call documentation:    - I called the patient and answered his questions.     Edenilson Austad, MD  Associate Professor  Section of Hematology Oncology  [facility] Department of Medicine

## 2018-07-24 ENCOUNTER — Ambulatory Visit (HOSPITAL_BASED_OUTPATIENT_CLINIC_OR_DEPARTMENT_OTHER): Payer: Commercial Managed Care - PPO | Admitting: Mental Health

## 2018-07-27 ENCOUNTER — Other Ambulatory Visit: Payer: Self-pay

## 2018-07-27 ENCOUNTER — Other Ambulatory Visit (HOSPITAL_BASED_OUTPATIENT_CLINIC_OR_DEPARTMENT_OTHER): Payer: Self-pay | Admitting: General Practice

## 2018-07-27 ENCOUNTER — Inpatient Hospital Stay (INDEPENDENT_AMBULATORY_CARE_PROVIDER_SITE_OTHER)
Admission: RE | Admit: 2018-07-27 | Discharge: 2018-07-27 | Disposition: A | Discharge status: Home / Self Care | Payer: Commercial Managed Care - PPO | Source: Ambulatory Visit

## 2018-07-27 ENCOUNTER — Encounter (INDEPENDENT_AMBULATORY_CARE_PROVIDER_SITE_OTHER): Payer: Self-pay | Admitting: Internal Medicine

## 2018-07-27 ENCOUNTER — Telehealth: Payer: Commercial Managed Care - PPO | Admitting: Internal Medicine

## 2018-07-27 ENCOUNTER — Encounter (INDEPENDENT_AMBULATORY_CARE_PROVIDER_SITE_OTHER): Payer: Self-pay

## 2018-07-27 VITALS — BP 130/80 | Resp 14 | Ht 69.0 in | Wt 245.0 lb

## 2018-07-27 DIAGNOSIS — M7989 Other specified soft tissue disorders: Secondary | ICD-10-CM

## 2018-07-27 DIAGNOSIS — I2510 Atherosclerotic heart disease of native coronary artery without angina pectoris: Secondary | ICD-10-CM

## 2018-07-27 DIAGNOSIS — Z0000 Encounter for general adult medical examination without abnormal findings: Principal | ICD-10-CM

## 2018-07-27 DIAGNOSIS — B20 Human immunodeficiency virus [HIV] disease: Secondary | ICD-10-CM

## 2018-07-27 DIAGNOSIS — I10 Essential (primary) hypertension: Secondary | ICD-10-CM

## 2018-07-27 DIAGNOSIS — C3490 Malignant neoplasm of unspecified part of unspecified bronchus or lung: Secondary | ICD-10-CM

## 2018-07-27 DIAGNOSIS — E119 Type 2 diabetes mellitus without complications: Secondary | ICD-10-CM

## 2018-07-27 MED ORDER — EMPAGLIFLOZIN 25 MG TABLET
25.00 mg | ORAL_TABLET | Freq: Every day | ORAL | 1 refills | Status: AC
Start: 2018-07-27 — End: ?

## 2018-07-27 MED ORDER — METOPROLOL TARTRATE 25 MG TABLET
25.00 mg | ORAL_TABLET | Freq: Two times a day (BID) | ORAL | 1 refills | Status: AC
Start: 2018-07-27 — End: ?

## 2018-07-27 MED ORDER — METFORMIN 1,000 MG TABLET
1000.00 mg | ORAL_TABLET | Freq: Two times a day (BID) | ORAL | 1 refills | Status: DC
Start: 2018-07-27 — End: 2018-07-29

## 2018-07-27 NOTE — Progress Notes (Signed)
INTERNAL MEDICINE, [facility]  [address]  [place] [address]  [facility]  Video Visit     Name: Thomas Barry  MRN: [id number]    Date: 07/27/2018  Age: 53 y.o.                             Patient's location: Home - [place] [address]   Patient/family aware of provider location: Yes  Patient/family consent for video visit: Yes  Interview and observation performed by: Janalee Dane, DO    Chief Complaint: Establish Care; Well Check Adult; and Right Leg Pain    History of Present Illness:  Thomas Barry is a 53 y.o. male presents to establish care, well exam, chronic medical conditions include hypertension, HIV, metastatic small cell lung cancer.  He previously lived in [place] and is currently living here for family support.  He does plan on returning the [place] after treatment for small cell lung cancer.  The Comprehensive Health Assessment was completed by the patient and reviewed with them.    Diet:  "healthy" diet  in general  High protein and vegetables  Activity level: sedentary lifestyle  Sleep: Poor sleep quality  Compliant with taking medications: No    Patient Active Problem List   Diagnosis   . Small cell lung cancer (CMS HCC)   . HIV (human immunodeficiency virus infection) (CMS HCC)   . CAD (coronary artery disease)   . DM (diabetes mellitus) (CMS [place])   . Morbid obesity   . Hyperbilirubinemia       Acute issues: yes      DM:   Metformin 1000mg  BID, jardiance 25mg  QD  Fasting FS range 140   Nonfasting FS range Does not check   Hypoglycemia episodes  No   Compliant with diabetic diet No     Diabetes Monitors  A1C: 7.7  A1C Date: 06/12/2018  Nephropathy Screening: Urine Microalbumin: Not Found Microalbumin Date: Not Found    Last Lipid Panel  (Last result in the past 2 years)      Cholesterol   HDL   LDL   Direct LDL   Triglycerides      06/30/18 1201 246 49 152   226        Retinal Exam Date: Not Found  Last diabetic foot exam: Not Found    Hypertension -     Compliant with medications  Avoiding excessive salt in diet  No side effects to report  No chest pain, headache, blurred or double vision    Coronary artery disease status post stent placement 2011, CABG 2018  He is on aspirin, Giardiance, metformin    Depression  He has had a difficult time with recent cancer diagnosis  Currently prescribed  Zoloft 100 mg daily and Wellbutrin, currently not taking those medications, personal choice, felt stress was work related and currently not at work    Small cell lung cancer, following with Heme-Onc  Metastatic to liver and brain, received radiation treatment early February 2020 and 1 cycle of chemotherapy in late February.    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  He follows with ID at his home in [place] and plans to return there    Right leg injury  Fell approx 1 week ago, landed on both knees  Pain is aching and moderate  Ambulating seems to make it worse, nothing seems to make it better  He does have some significant swelling of right lower extremity that started after the incident, he is concern for blood clot, his hematologist prescribed Lasix as both legs were slightly edematous at the time, the right leg is getting worse  He denies any shortness of breath  The pain is located in the distal right lower extremity, does not radiate      Past Medical History:  He has a past medical history of Diabetes (CMS [place]), HIV positive (CMS [place]) (06/13/2018), HTN (hypertension), and Small cell lung cancer (CMS [place]).    Past Surgical History:  He has a past surgical history that includes hx coronary artery bypass graft and hx other.    Problem List:  He has Small cell lung cancer (CMS [place]); HIV (human immunodeficiency virus infection) (CMS HCC); CAD (coronary artery disease); DM (diabetes mellitus) (CMS [place]); Morbid obesity; and Hyperbilirubinemia on their problem list.    Medications:  .   aspirin  .  atovaquone  .  benzonatate  .  buPROPion  .  dolutegravir  .  empagliflozin  .  Descovy  .  furosemide  .  MetFORMIN  .  metoprolol tartrate  .  ondansetron  .  oxyCODONE  .  polyethylene glycol 3350 (MIRALAX ORAL)  .  promethazine  .  sertraline  .  sodium chloride  .  triamcinolone acetonide  .  trimethoprim-sulfamethoxazole     Review of Systems:  Cardiac: no CP or palpitations  Full ROS completed and negative unless otherwise noted in HPI    Observational Exam:   Physical Exam   Constitutional: He is well-developed, well-nourished, and in no distress.  Non-toxic appearance. He does not have a sickly appearance. No distress.   HENT:   Head: Normocephalic and atraumatic. Not macrocephalic and not microcephalic. Head is without raccoon's eyes, without right periorbital erythema and without left periorbital erythema.   Right Ear: External ear normal.   Left Ear: External ear normal.   Eyes: EOM are normal. Right eye exhibits no discharge. Left eye exhibits no discharge. No scleral icterus.   Neck: Normal range of motion. No JVD present. No tracheal deviation present.   No visible goiter   Pulmonary/Chest: Effort normal.   Musculoskeletal:         General: Edema (Right leg, +2-3 pitting up to knee) present.   Skin: No petechiae and no rash noted. He is not diaphoretic. No erythema. No pallor.   Psychiatric: His mood appears not anxious. His affect is not inappropriate. He is not agitated. He does not express impulsivity. He does not exhibit a depressed mood. He does not have a flat affect.     Vitals:    07/27/18 1025   BP: 130/80   Resp: 14   Weight: 111 kg (245 lb)   Height: 1.753 m (5\' 9" )   BMI: 36.26     All vitals are patient reported with an exception of respiratory rate that was observed    Data Reviewed:   CBC  Diff   Lab Results   Component Value Date/Time    WBC 14.0 (H) 07/22/2018 10:59 AM    HGB 12.2 (L) 07/22/2018 10:59 AM    HCT 37.7 (L) 07/22/2018 10:59 AM    PLTCNT 349 07/22/2018 10:59 AM     RBC 4.08 (L) 07/22/2018 10:59 AM    MCV 92.4 07/22/2018 10:59 AM    MCHC 32.4 07/22/2018 10:59  AM    MCH 29.9 07/22/2018 10:59 AM    MPV 10.5 07/22/2018 10:59 AM    Lab Results   Component Value Date/Time    PMNS 69 07/22/2018 10:59 AM    LYMPHOCYTES 20 07/15/2018 11:47 AM    EOSINOPHIL 2 07/15/2018 11:47 AM    MONOCYTES 7 07/22/2018 10:59 AM    BASOPHILS 1 07/22/2018 10:59 AM    BASOPHILS 0.15 07/22/2018 10:59 AM    PMNABS 9.58 (H) 07/22/2018 10:59 AM    LYMPHSABS 3.11 07/22/2018 10:59 AM    EOSABS <0.10 07/22/2018 10:59 AM    MONOSABS 1.01 07/22/2018 10:59 AM        Comprehensive Metabolic Profile    SODIUM   Date Value Ref Range Status   07/22/2018 136 136 - 145 mmol/L Final     POTASSIUM   Date Value Ref Range Status   07/22/2018 3.7 3.5 - 5.1 mmol/L Final     CHLORIDE   Date Value Ref Range Status   06/30/2018 104 96 - 111 mmol/L Final     CO2 TOTAL   Date Value Ref Range Status   06/30/2018 21 (L) 22 - 32 mmol/L Final     ANION GAP   Date Value Ref Range Status   06/30/2018 12 4 - 13 mmol/L Final     BUN   Date Value Ref Range Status   07/07/2018 10 8 - 25 mg/dL Final     CREATININE   Date Value Ref Range Status   07/22/2018 0.75 0.62 - 1.27 mg/dL Final     GLUCOSE   Date Value Ref Range Status   06/30/2018 >= 500 (A) Negative mg/dL Final    CALCIUM   Date Value Ref Range Status   07/22/2018 9.4 8.5 - 10.2 mg/dL Final     PHOSPHORUS   Date Value Ref Range Status   07/22/2018 2.6 2.4 - 4.7 mg/dL Final     Comment:     High doses of Liposomal Amphotericin B (AmBisome) therapy may cause falsely elevated Phosphorus results     ALBUMIN   Date Value Ref Range Status   06/30/2018 3.4 (L) 3.5 - 5.0 g/dL Final     PROTEIN TOTAL   Date Value Ref Range Status   06/30/2018 7.3 6.0 - 7.9 g/dL Final     ALKALINE PHOSPHATASE   Date Value Ref Range Status   07/22/2018 584 (H) 45 - 115 U/L Final     AST (SGOT)   Date Value Ref Range Status   07/22/2018 104 (H) 8 - 48 U/L Final     ALT (SGPT)   Date Value Ref Range Status      07/22/2018 152 (H) <55 U/L Final     BILIRUBIN TOTAL   Date Value Ref Range Status   07/22/2018 5.9 (H) 0.3 - 1.3 mg/dL Final     Comment:     Naproxen treatment can falsely elevate total bilirubin levels.        Last Value    TSH   Date Value Ref Range Status   07/22/2018 0.053 (L) 0.350 - 5.000 uIU/mL Final            Assessment/Plan:  1. Encounter for annual health examination  Perform dietary counseling, increase fruit and vegetable intake    2. Coronary artery disease, angina presence unspecified, unspecified vessel or lesion type, unspecified whether native or transplanted heart  Stable without chest pain  Continue current medications    3. Small cell  lung cancer (CMS HCC)  No acute worsening, continue to follow with Hematology  Management per Hematology    4. DM (diabetes mellitus) (CMS HCC)  Stable, continue current medications  - MetFORMIN (GLUCOPHAGE) 1,000 mg Oral Tablet; Take 1 Tab (1,000 mg total) by mouth Twice daily with food  Dispense: 180 Tab; Refill: 1  - empagliflozin (JARDIANCE) 25 mg Oral Tablet; Take 1 Tab (25 mg total) by mouth Once a day  Dispense: 90 Tab; Refill: 1    5. HIV infection, unspecified symptom status (CMS HCC)  Stable, continue following with Infectious Disease  Continue current medications    6. Essential hypertension  Stable, continue current medications  - metoprolol tartrate (LOPRESSOR) 25 mg Oral Tablet; Take 1 Tab (25 mg total) by mouth Twice daily  Dispense: 180 Tab; Refill: 1    7. Right leg swelling  Acute, slowly worsening  With recent injury and right lower extremity swelling with pitting edema, concern for DVT, ultrasound ordered as below  - PERIPHERAL VENOUS DUPLEX - LOWER; Future      Follow Up:  as needed if symptoms worsen or fail to improve, otherwise three-month    Janalee Dane, DO

## 2018-07-28 ENCOUNTER — Observation Stay
Admission: AD | Admit: 2018-07-28 | Discharge: 2018-07-29 | Disposition: A | Discharge status: Home / Self Care | Payer: Commercial Managed Care - PPO | Source: Ambulatory Visit | Attending: Internal Medicine | Admitting: Internal Medicine

## 2018-07-28 ENCOUNTER — Encounter (INDEPENDENT_AMBULATORY_CARE_PROVIDER_SITE_OTHER): Payer: Self-pay | Admitting: Internal Medicine

## 2018-07-28 ENCOUNTER — Observation Stay (HOSPITAL_BASED_OUTPATIENT_CLINIC_OR_DEPARTMENT_OTHER): Payer: Commercial Managed Care - PPO

## 2018-07-28 ENCOUNTER — Observation Stay (HOSPITAL_COMMUNITY): Payer: Commercial Managed Care - PPO

## 2018-07-28 ENCOUNTER — Encounter (HOSPITAL_BASED_OUTPATIENT_CLINIC_OR_DEPARTMENT_OTHER): Payer: Self-pay | Admitting: Family

## 2018-07-28 ENCOUNTER — Observation Stay (HOSPITAL_BASED_OUTPATIENT_CLINIC_OR_DEPARTMENT_OTHER): Payer: Commercial Managed Care - PPO | Admitting: Internal Medicine

## 2018-07-28 ENCOUNTER — Other Ambulatory Visit: Payer: Self-pay

## 2018-07-28 ENCOUNTER — Encounter (HOSPITAL_COMMUNITY): Payer: Self-pay | Admitting: Family

## 2018-07-28 ENCOUNTER — Inpatient Hospital Stay (HOSPITAL_BASED_OUTPATIENT_CLINIC_OR_DEPARTMENT_OTHER): Payer: Commercial Managed Care - PPO

## 2018-07-28 ENCOUNTER — Inpatient Hospital Stay
Admission: RE | Admit: 2018-07-28 | Discharge: 2018-07-28 | Disposition: A | Discharge status: Home / Self Care | Payer: Commercial Managed Care - PPO | Source: Ambulatory Visit | Attending: General Practice | Admitting: General Practice

## 2018-07-28 ENCOUNTER — Inpatient Hospital Stay (HOSPITAL_BASED_OUTPATIENT_CLINIC_OR_DEPARTMENT_OTHER): Payer: Commercial Managed Care - PPO | Admitting: Family

## 2018-07-28 VITALS — BP 159/72 | HR 109 | Temp 98.2°F | Resp 18 | Ht 69.0 in | Wt 272.3 lb

## 2018-07-28 DIAGNOSIS — C3490 Malignant neoplasm of unspecified part of unspecified bronchus or lung: Secondary | ICD-10-CM

## 2018-07-28 DIAGNOSIS — B20 Human immunodeficiency virus [HIV] disease: Secondary | ICD-10-CM | POA: Insufficient documentation

## 2018-07-28 DIAGNOSIS — E878 Other disorders of electrolyte and fluid balance, not elsewhere classified: Secondary | ICD-10-CM

## 2018-07-28 DIAGNOSIS — R600 Localized edema: Secondary | ICD-10-CM

## 2018-07-28 DIAGNOSIS — Z87891 Personal history of nicotine dependence: Secondary | ICD-10-CM | POA: Insufficient documentation

## 2018-07-28 DIAGNOSIS — F329 Major depressive disorder, single episode, unspecified: Secondary | ICD-10-CM | POA: Insufficient documentation

## 2018-07-28 DIAGNOSIS — R140 Abdominal distension (gaseous): Secondary | ICD-10-CM

## 2018-07-28 DIAGNOSIS — R188 Other ascites: Principal | ICD-10-CM | POA: Insufficient documentation

## 2018-07-28 DIAGNOSIS — R627 Adult failure to thrive: Secondary | ICD-10-CM | POA: Insufficient documentation

## 2018-07-28 DIAGNOSIS — Z923 Personal history of irradiation: Secondary | ICD-10-CM | POA: Insufficient documentation

## 2018-07-28 DIAGNOSIS — E119 Type 2 diabetes mellitus without complications: Secondary | ICD-10-CM | POA: Diagnosis present

## 2018-07-28 DIAGNOSIS — C7951 Secondary malignant neoplasm of bone: Secondary | ICD-10-CM | POA: Insufficient documentation

## 2018-07-28 DIAGNOSIS — Z7982 Long term (current) use of aspirin: Secondary | ICD-10-CM | POA: Insufficient documentation

## 2018-07-28 DIAGNOSIS — Z882 Allergy status to sulfonamides status: Secondary | ICD-10-CM | POA: Insufficient documentation

## 2018-07-28 DIAGNOSIS — E041 Nontoxic single thyroid nodule: Secondary | ICD-10-CM | POA: Insufficient documentation

## 2018-07-28 DIAGNOSIS — I509 Heart failure, unspecified: Secondary | ICD-10-CM

## 2018-07-28 DIAGNOSIS — Z9221 Personal history of antineoplastic chemotherapy: Secondary | ICD-10-CM | POA: Insufficient documentation

## 2018-07-28 DIAGNOSIS — R918 Other nonspecific abnormal finding of lung field: Secondary | ICD-10-CM

## 2018-07-28 DIAGNOSIS — I361 Nonrheumatic tricuspid (valve) insufficiency: Secondary | ICD-10-CM

## 2018-07-28 DIAGNOSIS — R109 Unspecified abdominal pain: Secondary | ICD-10-CM

## 2018-07-28 DIAGNOSIS — C787 Secondary malignant neoplasm of liver and intrahepatic bile duct: Secondary | ICD-10-CM | POA: Insufficient documentation

## 2018-07-28 DIAGNOSIS — I2510 Atherosclerotic heart disease of native coronary artery without angina pectoris: Secondary | ICD-10-CM | POA: Insufficient documentation

## 2018-07-28 DIAGNOSIS — C7931 Secondary malignant neoplasm of brain: Secondary | ICD-10-CM | POA: Insufficient documentation

## 2018-07-28 DIAGNOSIS — E8770 Fluid overload, unspecified: Secondary | ICD-10-CM

## 2018-07-28 DIAGNOSIS — E038 Other specified hypothyroidism: Secondary | ICD-10-CM | POA: Insufficient documentation

## 2018-07-28 DIAGNOSIS — I10 Essential (primary) hypertension: Secondary | ICD-10-CM | POA: Insufficient documentation

## 2018-07-28 DIAGNOSIS — I517 Cardiomegaly: Secondary | ICD-10-CM

## 2018-07-28 DIAGNOSIS — E2740 Unspecified adrenocortical insufficiency: Secondary | ICD-10-CM | POA: Insufficient documentation

## 2018-07-28 DIAGNOSIS — Z79899 Other long term (current) drug therapy: Secondary | ICD-10-CM | POA: Insufficient documentation

## 2018-07-28 DIAGNOSIS — Z794 Long term (current) use of insulin: Secondary | ICD-10-CM | POA: Insufficient documentation

## 2018-07-28 DIAGNOSIS — Z951 Presence of aortocoronary bypass graft: Secondary | ICD-10-CM | POA: Insufficient documentation

## 2018-07-28 DIAGNOSIS — B1910 Unspecified viral hepatitis B without hepatic coma: Secondary | ICD-10-CM

## 2018-07-28 DIAGNOSIS — G9389 Other specified disorders of brain: Secondary | ICD-10-CM

## 2018-07-28 DIAGNOSIS — F32A Depression, unspecified: Secondary | ICD-10-CM

## 2018-07-28 DIAGNOSIS — F419 Anxiety disorder, unspecified: Secondary | ICD-10-CM | POA: Insufficient documentation

## 2018-07-28 DIAGNOSIS — E871 Hypo-osmolality and hyponatremia: Secondary | ICD-10-CM | POA: Insufficient documentation

## 2018-07-28 DIAGNOSIS — R9431 Abnormal electrocardiogram [ECG] [EKG]: Secondary | ICD-10-CM

## 2018-07-28 DIAGNOSIS — G4733 Obstructive sleep apnea (adult) (pediatric): Secondary | ICD-10-CM | POA: Insufficient documentation

## 2018-07-28 DIAGNOSIS — E876 Hypokalemia: Secondary | ICD-10-CM | POA: Insufficient documentation

## 2018-07-28 HISTORY — DX: Unspecified viral hepatitis B without hepatic coma: B19.10

## 2018-07-28 HISTORY — DX: Depression, unspecified: F32.A

## 2018-07-28 HISTORY — DX: Other specified hypothyroidism: E03.8

## 2018-07-28 HISTORY — DX: Hypo-osmolality and hyponatremia: E87.1

## 2018-07-28 HISTORY — DX: Hypomagnesemia: E83.42

## 2018-07-28 HISTORY — DX: Hypokalemia: E87.6

## 2018-07-28 HISTORY — DX: Localized edema: R60.0

## 2018-07-28 HISTORY — DX: Presence of aortocoronary bypass graft: Z95.1

## 2018-07-28 HISTORY — DX: Other disorders of phosphorus metabolism: E83.39

## 2018-07-28 LAB — CBC WITH DIFF
BASOPHIL #: <0.1 x10ˆ3/uL (ref <=0.20)
BASOPHIL %: 1 %
EOSINOPHIL #: <0.1 x10ˆ3/uL (ref <=0.50)
EOSINOPHIL %: 1 %
HCT: 35 % — ABNORMAL LOW (ref 38.9–52.0)
HGB: 11.3 g/dL — ABNORMAL LOW (ref 13.4–17.5)
IMMATURE GRANULOCYTE #: 0.1 10*3/uL — ABNORMAL HIGH (ref <0.10)
IMMATURE GRANULOCYTE %: 1 % (ref 0–1)
LYMPHOCYTE #: 1.75 10*3/uL (ref 1.00–4.80)
LYMPHOCYTE %: 25 %
MCH: 30.1 pg (ref 26.0–32.0)
MCH: 30.1 pg (ref 26.0–32.0)
MCHC: 32.3 g/dL (ref 31.0–35.5)
MCV: 93.1 fL (ref 78.0–100.0)
MONOCYTE #: 1 10*3/uL (ref 0.20–1.10)
MONOCYTE %: 14 %
MPV: 10 fL (ref 8.7–12.5)
NEUTROPHIL #: 4.18 x10ˆ3/uL (ref 1.50–7.70)
NEUTROPHIL %: 58 %
PLATELETS: 356 10*3/uL (ref 150–400)
RBC: 3.76 10*6/uL — ABNORMAL LOW (ref 4.50–6.10)
RDW-CV: 20.7 % — ABNORMAL HIGH (ref 11.5–15.5)
WBC: 7.2 x10ˆ3/uL (ref 3.7–11.0)

## 2018-07-28 LAB — TYPE AND SCREEN
ABO/RH(D): A POS
ANTIBODY SCREEN: NEGATIVE

## 2018-07-28 LAB — ECG 12-LEAD
Atrial Rate: 93 {beats}/min
Calculated P Axis: 33 degrees
Calculated R Axis: 50 degrees
Calculated T Axis: 92 degrees
PR Interval: 148 ms
QRS Duration: 114 ms
QRS Duration: 114 ms
QT Interval: 372 ms
QTC Calculation: 462 ms
Ventricular rate: 93 {beats}/min

## 2018-07-28 LAB — BASIC METABOLIC PANEL
ANION GAP: 10 mmol/L (ref 4–13)
BUN/CREA RATIO: 20 (ref 6–22)
BUN: 13 mg/dL (ref 8–25)
CALCIUM: 9.1 mg/dL (ref 8.5–10.2)
CHLORIDE: 100 mmol/L (ref 96–111)
CO2 TOTAL: 22 mmol/L (ref 22–32)
CREATININE: 0.64 mg/dL (ref 0.62–1.27)
ESTIMATED GFR: >60 mL/min/1.73mˆ2 (ref >60)
GLUCOSE: 265 mg/dL — ABNORMAL HIGH (ref 65–139)
POTASSIUM: 2.9 mmol/L — ABNORMAL LOW (ref 3.5–5.1)
SODIUM: 132 mmol/L — ABNORMAL LOW (ref 136–145)

## 2018-07-28 LAB — AST (SGOT): AST (SGOT): 119 U/L — ABNORMAL HIGH (ref 8–48)

## 2018-07-28 LAB — POC BLOOD GLUCOSE (RESULTS)
GLUCOSE, POC: 306 mg/dL — ABNORMAL HIGH (ref 70–105)
GLUCOSE, POC: 270 mg/dL — ABNORMAL HIGH (ref 70–105)

## 2018-07-28 LAB — THYROXINE, FREE (FREE T4)
THYROXINE (T4), FREE: 0.65 ng/dL — ABNORMAL LOW (ref 0.70–1.25)
THYROXINE (T4), FREE: 0.65 ng/dL — ABNORMAL LOW (ref 0.70–1.25)

## 2018-07-28 LAB — PHOSPHORUS
PHOSPHORUS: 1.9 mg/dL — ABNORMAL LOW (ref 2.4–4.7)
PHOSPHORUS: 1.8 mg/dL — ABNORMAL LOW (ref 2.4–4.7)

## 2018-07-28 LAB — BILIRUBIN TOTAL: BILIRUBIN TOTAL: 9.8 mg/dL — ABNORMAL HIGH (ref 0.3–1.3)

## 2018-07-28 LAB — ALBUMIN: ALBUMIN: 2.5 g/dL — ABNORMAL LOW (ref 3.5–5.0)

## 2018-07-28 LAB — CREATININE WITH EGFR
CREATININE: 0.78 mg/dL (ref 0.62–1.27)
ESTIMATED GFR: >60 mL/min/1.73mˆ2 (ref >60)

## 2018-07-28 LAB — ALK PHOS (ALKALINE PHOSPHATASE): ALKALINE PHOSPHATASE: 762 U/L — ABNORMAL HIGH (ref 45–115)

## 2018-07-28 LAB — MAGNESIUM
MAGNESIUM: 1.5 mg/dL — ABNORMAL LOW (ref 1.6–2.6)
MAGNESIUM: 1.5 mg/dL — ABNORMAL LOW (ref 1.6–2.6)

## 2018-07-28 LAB — B-TYPE NATRIURETIC PEPTIDE (BNP),PLASMA: BNP: 68 pg/mL (ref <=100)

## 2018-07-28 LAB — SODIUM: SODIUM: 131 mmol/L — ABNORMAL LOW (ref 136–145)

## 2018-07-28 LAB — THYROID STIMULATING HORMONE (SENSITIVE TSH): TSH: 0.043 u[IU]/mL — ABNORMAL LOW (ref 0.350–5.000)

## 2018-07-28 LAB — T3 (TRIIODOTHYRONINE), FREE, SERUM: T3 FREE: 1.8 pg/mL (ref 1.7–3.7)

## 2018-07-28 LAB — PLATELETS AND ANC CANCER CENTER
NEUTROPHILS #: 4.18 x10ˆ3/uL (ref 1.50–7.70)
PLATELET COUNT: 356 x10ˆ3/uL (ref 150–400)

## 2018-07-28 LAB — LDH: LDH: 534 U/L — ABNORMAL HIGH (ref 125–220)

## 2018-07-28 LAB — POTASSIUM: POTASSIUM: 2.7 mmol/L — CL (ref 3.5–5.1)

## 2018-07-28 LAB — CALCIUM: CALCIUM: 8.8 mg/dL (ref 8.5–10.2)

## 2018-07-28 LAB — ALT (SGPT): ALT (SGPT): 176 U/L — ABNORMAL HIGH (ref <55)

## 2018-07-28 MED ORDER — SODIUM CHLORIDE 0.9 % INTRAVENOUS SOLUTION
30.0000 mmol | Freq: Once | INTRAVENOUS | Status: AC
Start: 2018-07-28 — End: 2018-07-29
  Administered 2018-07-28: 21:00:00 30 mmol via INTRAVENOUS
  Administered 2018-07-29: 01:00:00 0 mmol via INTRAVENOUS
  Filled 2018-07-28: qty 10

## 2018-07-28 MED ORDER — IOPAMIDOL 300 MG IODINE/ML (61 %) INTRAVENOUS SOLUTION
INTRAVENOUS | Status: AC
Start: 2018-07-28 — End: 2018-07-28
  Filled 2018-07-28: qty 100

## 2018-07-28 MED ORDER — POLYETHYLENE GLYCOL 3350 17 GRAM ORAL POWDER PACKET
17.0000 g | Freq: Every day | ORAL | Status: DC
Start: 2018-07-28 — End: 2018-07-28
  Administered 2018-07-28: 15:00:00 0 g via ORAL

## 2018-07-28 MED ORDER — PERFLUTREN LIPID MICROSPHERES 1.1 MG/ML INTRAVENOUS SUSPENSION
2.00 mL | INTRAVENOUS | Status: AC
Start: 2018-07-28 — End: 2018-07-28
  Administered 2018-07-28: 16:00:00 2 mL via INTRAVENOUS

## 2018-07-28 MED ORDER — INSULIN REGULAR HUMAN 100 UNIT/ML INJECTION SSIP
0.00 [IU] | INJECTION | Freq: Four times a day (QID) | SUBCUTANEOUS | Status: DC | PRN
Start: 2018-07-28 — End: 2018-07-29
  Administered 2018-07-28 (×2): 6 [IU] via SUBCUTANEOUS
  Administered 2018-07-29 (×2): 2 [IU] via SUBCUTANEOUS
  Filled 2018-07-28: qty 3

## 2018-07-28 MED ORDER — POTASSIUM CHLORIDE 20 MEQ/50 ML IN STERILE WATER INTRAVENOUS PIGGYBACK
20.0000 meq | INJECTION | Freq: Once | INTRAVENOUS | Status: AC
Start: 2018-07-28 — End: 2018-07-28
  Administered 2018-07-28: 18:00:00 0 meq via INTRAVENOUS
  Administered 2018-07-28: 17:00:00 20 meq via INTRAVENOUS
  Filled 2018-07-28: qty 50

## 2018-07-28 MED ORDER — ONDANSETRON HCL (PF) 4 MG/2 ML INJECTION SOLUTION
4.00 mg | Freq: Three times a day (TID) | INTRAMUSCULAR | Status: DC | PRN
Start: 2018-07-28 — End: 2018-07-29

## 2018-07-28 MED ORDER — OXYCODONE 5 MG TABLET
5.00 mg | ORAL_TABLET | ORAL | Status: DC | PRN
Start: 2018-07-28 — End: 2018-07-29
  Administered 2018-07-28 – 2018-07-29 (×5): 5 mg via ORAL
  Filled 2018-07-28 (×5): qty 1

## 2018-07-28 MED ORDER — METOPROLOL TARTRATE 25 MG TABLET
25.00 mg | ORAL_TABLET | Freq: Two times a day (BID) | ORAL | Status: DC
Start: 2018-07-28 — End: 2018-07-29
  Administered 2018-07-28 – 2018-07-29 (×2): 25 mg via ORAL
  Filled 2018-07-28 (×2): qty 1

## 2018-07-28 MED ORDER — SODIUM CHLORIDE 0.9 % IV BOLUS
250.0000 mL | INJECTION | Status: AC
Start: 2018-07-28 — End: 2018-07-28
  Administered 2018-07-28: 15:00:00 250 mL via INTRAVENOUS
  Administered 2018-07-28: 15:00:00 0 mL via INTRAVENOUS

## 2018-07-28 MED ORDER — BENZONATATE 100 MG CAPSULE
100.00 mg | ORAL_CAPSULE | Freq: Three times a day (TID) | ORAL | Status: DC
Start: 2018-07-28 — End: 2018-07-29
  Administered 2018-07-28 – 2018-07-29 (×4): 100 mg via ORAL
  Filled 2018-07-28 (×5): qty 1

## 2018-07-28 MED ORDER — MAGNESIUM SULFATE 2 GRAM/50 ML (4 %) IN WATER INTRAVENOUS PIGGYBACK
2.0000 g | INJECTION | Freq: Once | INTRAVENOUS | Status: AC
Start: 2018-07-28 — End: 2018-07-28
  Administered 2018-07-28: 18:00:00 0 g via INTRAVENOUS
  Administered 2018-07-28: 17:00:00 2 g via INTRAVENOUS
  Filled 2018-07-28: qty 50

## 2018-07-28 MED ORDER — ASPIRIN 81 MG TABLET,DELAYED RELEASE
81.0000 mg | DELAYED_RELEASE_TABLET | Freq: Every day | ORAL | Status: DC
Start: 2018-07-28 — End: 2018-07-29
  Administered 2018-07-28 – 2018-07-29 (×2): 81 mg via ORAL
  Filled 2018-07-28 (×3): qty 1

## 2018-07-28 MED ORDER — ATOVAQUONE 750 MG/5 ML ORAL SUSPENSION
1500.00 mg | Freq: Every morning | ORAL | Status: DC
Start: 2018-07-29 — End: 2018-07-29
  Administered 2018-07-28: 16:00:00
  Administered 2018-07-29: 08:00:00 0 mg via ORAL
  Administered 2018-07-29: 20:00:00
  Administered 2018-07-29: 08:00:00 0 mg via ORAL

## 2018-07-28 MED ORDER — LEVOTHYROXINE 25 MCG TABLET
25.0000 ug | ORAL_TABLET | Freq: Every morning | ORAL | Status: DC
Start: 2018-07-28 — End: 2018-07-28
  Administered 2018-07-28: 16:00:00 25 ug via ORAL
  Filled 2018-07-28: qty 1

## 2018-07-28 MED ORDER — SODIUM CHLORIDE 0.9 % (FLUSH) INJECTION SYRINGE
20.00 mL | INJECTION | INTRAMUSCULAR | Status: DC | PRN
Start: 2018-07-28 — End: 2018-07-29

## 2018-07-28 MED ORDER — SODIUM CHLORIDE 0.9 % INTRAVENOUS SOLUTION
INTRAVENOUS | Status: DC
Start: 2018-07-28 — End: 2018-07-28

## 2018-07-28 MED ORDER — ENOXAPARIN 40 MG/0.4 ML SUBCUTANEOUS SYRINGE
40.0000 mg | INJECTION | SUBCUTANEOUS | Status: DC
Start: 2018-07-28 — End: 2018-07-29
  Administered 2018-07-28 (×2): 40 mg via SUBCUTANEOUS
  Filled 2018-07-28: qty 0.4

## 2018-07-28 MED ORDER — POTASSIUM BICARBONATE-CITRIC ACID 20 MEQ EFFERVESCENT TABLET
40.0000 meq | EFFERVESCENT_TABLET | ORAL | Status: AC
Start: 2018-07-28 — End: 2018-07-28
  Administered 2018-07-28: 15:00:00 40 meq via ORAL
  Filled 2018-07-28: qty 2

## 2018-07-28 MED ORDER — FUROSEMIDE 20 MG TABLET
20.00 mg | ORAL_TABLET | Freq: Every day | ORAL | Status: DC | PRN
Start: 2018-07-28 — End: 2018-07-28

## 2018-07-28 MED ORDER — EMTRICITABINE 200 MG-TENOFOVIR ALAFENAMIDE FUMARATE 25 MG TABLET
1.0000 | ORAL_TABLET | Freq: Every day | ORAL | Status: DC
Start: 2018-07-28 — End: 2018-07-29
  Administered 2018-07-28 – 2018-07-29 (×2): 1 via ORAL
  Filled 2018-07-28 (×2): qty 1

## 2018-07-28 MED ORDER — SODIUM CHLORIDE 0.9 % (FLUSH) INJECTION SYRINGE
10.00 mL | INJECTION | Freq: Three times a day (TID) | INTRAMUSCULAR | Status: DC
Start: 2018-07-28 — End: 2018-07-29
  Administered 2018-07-28: 14:00:00 0 mL via INTRAVENOUS
  Administered 2018-07-28 – 2018-07-29 (×2): 10 mL via INTRAVENOUS
  Administered 2018-07-29: 06:00:00 0 mL via INTRAVENOUS

## 2018-07-28 MED ORDER — HEPARIN LOCK FLUSH (PORCINE) 100 UNIT/ML INTRAVENOUS SOLUTION
5.00 mL | Freq: Every day | INTRAVENOUS | Status: DC | PRN
Start: 2018-07-28 — End: 2018-07-29

## 2018-07-28 MED ORDER — SODIUM CHLORIDE 1 GRAM TABLET
1.00 g | ORAL_TABLET | Freq: Three times a day (TID) | ORAL | Status: DC
Start: 2018-07-28 — End: 2018-07-29
  Administered 2018-07-28 – 2018-07-29 (×3): 1 g via ORAL
  Administered 2018-07-29: 17:00:00
  Filled 2018-07-28 (×3): qty 1

## 2018-07-28 MED ORDER — SERTRALINE 100 MG TABLET
100.0000 mg | ORAL_TABLET | Freq: Every day | ORAL | Status: DC
Start: 2018-07-28 — End: 2018-07-28
  Filled 2018-07-28: qty 1

## 2018-07-28 MED ORDER — DOLUTEGRAVIR 50 MG TABLET
50.0000 mg | ORAL_TABLET | Freq: Every day | ORAL | Status: DC
Start: 2018-07-28 — End: 2018-07-29
  Administered 2018-07-28 – 2018-07-29 (×2): 50 mg via ORAL
  Filled 2018-07-28 (×2): qty 1

## 2018-07-28 MED ORDER — DIATRIZOATE MEGLUMINE-DIATRIZOATE SODIUM 66 %-10 % ORAL SOLUTION
8.00 mL | ORAL | Status: AC
Start: 2018-07-28 — End: 2018-07-28
  Administered 2018-07-28 (×2): 8 mL via ORAL

## 2018-07-28 MED ORDER — BUPROPION HCL SR 150 MG TABLET,12 HR SUSTAINED-RELEASE
150.0000 mg | ORAL_TABLET | Freq: Two times a day (BID) | ORAL | Status: DC
Start: 2018-07-28 — End: 2018-07-28

## 2018-07-28 MED ORDER — IOVERSOL 320 MG IODINE/ML INTRAVENOUS SOLUTION
100.00 mL | INTRAVENOUS | Status: AC
Start: 2018-07-28 — End: 2018-07-28
  Administered 2018-07-28: 18:00:00 100 mL via INTRAVENOUS

## 2018-07-28 MED ORDER — POLYETHYLENE GLYCOL 3350 17 GRAM ORAL POWDER PACKET
17.00 g | Freq: Every day | ORAL | Status: DC | PRN
Start: 2018-07-28 — End: 2018-07-29

## 2018-07-28 MED ORDER — POTASSIUM CHLORIDE 10 MEQ/100ML IN STERILE WATER INTRAVENOUS PIGGYBACK
10.0000 meq | INJECTION | INTRAVENOUS | Status: DC
Start: 2018-07-28 — End: 2018-07-28
  Filled 2018-07-28 (×3): qty 100

## 2018-07-28 MED ADMIN — magnesium sulfate 2 gram/50 mL (4 %) in water intravenous piggyback: INTRAVENOUS | @ 18:00:00

## 2018-07-28 MED ADMIN — sodium chloride 1 gram tablet: ORAL | @ 16:00:00

## 2018-07-28 MED ADMIN — ondansetron HCl (PF) 4 mg/2 mL injection solution: ORAL | @ 23:00:00

## 2018-07-28 MED ADMIN — dolutegravir 50 mg tablet: ORAL | @ 15:00:00

## 2018-07-28 MED ADMIN — insulin regular human 100 unit/mL injection solution: SUBCUTANEOUS | @ 14:00:00

## 2018-07-28 MED ADMIN — potassium chloride 20 mEq/50 mL in sterile water intravenous piggyback: INTRAVENOUS | @ 17:00:00

## 2018-07-28 MED ADMIN — sodium chloride 0.9 % intravenous solution: INTRAVENOUS | @ 15:00:00

## 2018-07-28 MED ADMIN — ioversoL 320 mg iodine/mL intravenous solution: INTRAVENOUS | @ 18:00:00

## 2018-07-28 MED ADMIN — enoxaparin 40 mg/0.4 mL subcutaneous syringe: SUBCUTANEOUS | @ 21:00:00

## 2018-07-28 MED ADMIN — acetaminophen 325 mg tablet: INTRAVENOUS | @ 21:00:00

## 2018-07-28 MED ADMIN — sodium chloride 0.9 % intravenous solution: ORAL | @ 15:00:00

## 2018-07-28 MED ADMIN — benzonatate 100 mg capsule: ORAL | @ 14:00:00

## 2018-07-28 MED ADMIN — lactated Ringers intravenous solution: INTRAVENOUS | @ 15:00:00

## 2018-07-28 MED ADMIN — sodium chloride 0.9 % (flush) injection syringe: SUBCUTANEOUS | @ 22:00:00

## 2018-07-28 MED ADMIN — sodium phosphates 19 gram-7 gram/118 mL enema: INTRAVENOUS | @ 21:00:00

## 2018-07-28 MED ADMIN — fentaNYL (PF) 50 mcg/mL intravenous solution: ORAL | @ 16:00:00

## 2018-07-28 MED ADMIN — sodium chloride 0.9 % (flush) injection syringe: ORAL | @ 14:00:00

## 2018-07-28 MED ADMIN — diphenhydrAMINE 50 mg/mL injection solution: INTRAVENOUS | @ 17:00:00

## 2018-07-28 NOTE — Nurses Notes (Addendum)
1030: Pt to VAD. R chest port accessed utilizing sterile technique. + blood return and flush. Labs obtained and sent for processing. Port flushed with 20cc NS and clamped. Curos cap applied. Pt ambulatory to lobby. Mylinda Latina, [place] - critical lab result received: K+ 2.7. Reviewed remainder of labs and also noted Mg and Phos also low. Parameters are not met to treat today per Bilirubin. Spoke with Angelyn Punt, NP and patient will be admitted inpatient. No orders received. HBurns, RN

## 2018-07-28 NOTE — Nurses Notes (Addendum)
Pt alert and oriented x4. Vitals stable. Pt admits to having a fall prior to admission, pt should be high fall precautions but is refusing sitter select and video monitoring. Educated pt to call don't fall. Assessed pt per flowsheet. Medicated per MAR. Pain reported 0/10. No questions or concerns at this time. Call light in reach. Will continue to monitor.

## 2018-07-28 NOTE — Nurses Notes (Signed)
Discussed high fall risk with manager, he is aware of patient refusing. Manager is fine with him refusing as long as his ambulation stays stable. Will continue to monitor.
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
## 2018-07-28 NOTE — Nursing Note (Signed)
Report called to Faith on [facility].  Patient will be take to room 903 via w/c by SA.  K. Mckayla Mulcahey RN
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
## 2018-07-28 NOTE — Ancillary Notes (Signed)
Patient has been assigned to Medicine Hospitalist 12 service at this time. Please call that team with any questions.

## 2018-07-28 NOTE — Nurses Notes (Signed)
Patient admitted from cancer center. Patient alert & oriented x4. Plan of care reviewed with patient, verbalizes understanding. Complaints of diarrhea and pain. No concerns or questions at this time.  Call light in reach. Pt refusing sitter select at this time.

## 2018-07-28 NOTE — Nursing Note (Signed)
Becky from [facility] line notified for request for hospital admission on [facility].  K. Tayah Idrovo RN

## 2018-07-28 NOTE — Discharge Instructions (Signed)
If you have any questions after you are discharged, but before you see your Primary Care Physician you can contact Krissy Orebaugh, Hospitalist Service Coordinator at [phone]    Antonieta Slaven, LPN  Hospitalist Service Coordinator

## 2018-07-28 NOTE — Care Management Notes (Signed)
MSW attempted to go to pt's bedside to complete observation letter and complete initial assessment however was unable as MSW was advised by bedside nurse that pt is tearful and not a good time to speak with pt. Will attempt again at a later time.

## 2018-07-28 NOTE — Cancer Center Note (Signed)
Admit to [facility] for increased right lower extremity edema, unclear if related to extensive stage small cell lung cancer causing obstruction vs other cause. Dr. Carolynn Serve discussed admission with hospitalist 12 oncology service. Patient waited in cancer center for hospital bed.     Patient evaluated in shared visit with co-signing faculty.     Dorise Hiss, MSN, APRN, FNP-C

## 2018-07-28 NOTE — Transitional Care (Addendum)
[facility] Medicine   Transitional Care Coordination   Initial Assessment       Name: Safir Michalec   Date of Birth: January 09, 1966 53 y.o.  Date of service: 07/29/2018  Lay Caregiver: Lay Caregiver Name: Alphonzo Grieve, Hoyle Barr Caregiver Contact Number: [phone], Lay Caregiver Relationship to patient: parent       1. Patient appointment preferences: Early morning  2. Transportation to healthcare appointments: Yes  3. PCP listed as Janalee Dane, DO. This is correct per pt. Last visit: This past week. Obtained verbal consent from pt to contact PCP.   4. Preferred method of contact: MyChart  Confirmed patient contact information:   Home Phone [phone]   Work Phone [phone]   Mobile [phone]         Met with pt via telephone to discuss provider of primary care services.  Discussed role of Transitional Care Coordination Team and informed of contact within 2 business days of discharge to assess follow-up plan.    [facility], LPN  5/0/2774, 12:87       Edrick Oh Dahlgren, LPN  11/26/7670, 09:47     [facility] Medicine   Transitional Care Coordination   Initial Assessment       Name: Amori Colomb   Date of Birth: [birth year] 53 y.o.  Date of service: 07/28/2018  Lay Caregiver: [place] Caregiver Name: Alphonzo Grieve, Hoyle Barr Caregiver Contact Number: [phone], Lay Caregiver Relationship to patient: parent     Attempted to reach pt.  He was unavailable.  Will try again tomorrow.    [place], LPN  07/27/6544, 50:35

## 2018-07-29 ENCOUNTER — Inpatient Hospital Stay (HOSPITAL_BASED_OUTPATIENT_CLINIC_OR_DEPARTMENT_OTHER): Payer: Commercial Managed Care - PPO

## 2018-07-29 ENCOUNTER — Inpatient Hospital Stay (HOSPITAL_COMMUNITY): Payer: Commercial Managed Care - PPO | Admitting: Radiation Oncology

## 2018-07-29 ENCOUNTER — Encounter (HOSPITAL_COMMUNITY)
Admission: AD | Disposition: A | Discharge status: Home / Self Care | Payer: Self-pay | Source: Ambulatory Visit | Attending: Internal Medicine

## 2018-07-29 DIAGNOSIS — Z85118 Personal history of other malignant neoplasm of bronchus and lung: Secondary | ICD-10-CM

## 2018-07-29 DIAGNOSIS — E039 Hypothyroidism, unspecified: Secondary | ICD-10-CM

## 2018-07-29 DIAGNOSIS — R627 Adult failure to thrive: Secondary | ICD-10-CM

## 2018-07-29 DIAGNOSIS — F329 Major depressive disorder, single episode, unspecified: Secondary | ICD-10-CM

## 2018-07-29 DIAGNOSIS — R188 Other ascites: Secondary | ICD-10-CM

## 2018-07-29 DIAGNOSIS — C787 Secondary malignant neoplasm of liver and intrahepatic bile duct: Secondary | ICD-10-CM

## 2018-07-29 DIAGNOSIS — C7931 Secondary malignant neoplasm of brain: Secondary | ICD-10-CM

## 2018-07-29 DIAGNOSIS — F419 Anxiety disorder, unspecified: Secondary | ICD-10-CM

## 2018-07-29 LAB — CBC WITH DIFF
BASOPHIL #: <0.1 x10ˆ3/uL (ref <=0.20)
BASOPHIL %: 1 %
EOSINOPHIL #: <0.1 10*3/uL (ref <=0.50)
EOSINOPHIL %: 1 %
HCT: 32.6 % — ABNORMAL LOW (ref 38.9–52.0)
HCT: 32.6 % — ABNORMAL LOW (ref 38.9–52.0)
HGB: 10.6 g/dL — ABNORMAL LOW (ref 13.4–17.5)
IMMATURE GRANULOCYTE #: 0.1 x10ˆ3/uL — ABNORMAL HIGH (ref <0.10)
IMMATURE GRANULOCYTE %: 2 % — ABNORMAL HIGH (ref 0–1)
LYMPHOCYTE #: 1.67 x10ˆ3/uL (ref 1.00–4.80)
LYMPHOCYTE %: 25 %
MCH: 30.1 pg (ref 26.0–32.0)
MCHC: 32.5 g/dL (ref 31.0–35.5)
MCV: 92.6 fL (ref 78.0–100.0)
MONOCYTE #: 0.99 x10ˆ3/uL (ref 0.20–1.10)
MONOCYTE %: 15 %
MPV: 10 fL (ref 8.7–12.5)
MPV: 10 fL (ref 8.7–12.5)
NEUTROPHIL #: 3.92 x10ˆ3/uL (ref 1.50–7.70)
NEUTROPHIL %: 56 %
PLATELETS: 323 x10ˆ3/uL (ref 150–400)
RBC: 3.52 x10?6/uL — ABNORMAL LOW (ref 4.50–6.10)
RBC: 3.52 x10ˆ6/uL — ABNORMAL LOW (ref 4.50–6.10)
RDW-CV: 20.6 % — ABNORMAL HIGH (ref 11.5–15.5)
WBC: 6.8 x10ˆ3/uL (ref 3.7–11.0)

## 2018-07-29 LAB — BASIC METABOLIC PANEL
ANION GAP: 8 mmol/L (ref 4–13)
BUN/CREA RATIO: 17 (ref 6–22)
BUN: 11 mg/dL (ref 8–25)
CALCIUM: 8.6 mg/dL (ref 8.5–10.2)
CHLORIDE: 101 mmol/L (ref 96–111)
CO2 TOTAL: 24 mmol/L (ref 22–32)
CREATININE: 0.66 mg/dL (ref 0.62–1.27)
ESTIMATED GFR: >60 mL/min/1.73mˆ2 (ref >60)
GLUCOSE: 187 mg/dL — ABNORMAL HIGH (ref 65–139)
POTASSIUM: 3.2 mmol/L — ABNORMAL LOW (ref 3.5–5.1)
SODIUM: 133 mmol/L — ABNORMAL LOW (ref 136–145)

## 2018-07-29 LAB — HEPATIC FUNCTION PANEL
ALBUMIN: 2.2 g/dL — ABNORMAL LOW (ref 3.5–5.0)
ALKALINE PHOSPHATASE: 775 U/L — ABNORMAL HIGH (ref 45–115)
ALT (SGPT): 167 U/L — ABNORMAL HIGH (ref <55)
AST (SGOT): 118 U/L — ABNORMAL HIGH (ref 8–48)
BILIRUBIN DIRECT: 7.1 mg/dL — ABNORMAL HIGH (ref <0.3)
BILIRUBIN TOTAL: 9.4 mg/dL — ABNORMAL HIGH (ref 0.3–1.3)
PROTEIN TOTAL: 5.8 g/dL — ABNORMAL LOW (ref 6.4–8.3)

## 2018-07-29 LAB — POC BLOOD GLUCOSE (RESULTS)
GLUCOSE, POC: 173 mg/dL — ABNORMAL HIGH (ref 70–105)
GLUCOSE, POC: 173 mg/dl — ABNORMAL HIGH (ref 70–105)
GLUCOSE, POC: 183 mg/dL — ABNORMAL HIGH (ref 70–105)
GLUCOSE, POC: 183 mg/dl — ABNORMAL HIGH (ref 70–105)
GLUCOSE, POC: 156 mg/dL — ABNORMAL HIGH (ref 70–105)

## 2018-07-29 LAB — MAGNESIUM: MAGNESIUM: 1.9 mg/dL (ref 1.6–2.6)

## 2018-07-29 LAB — PT/INR
INR: 1.43 — ABNORMAL HIGH (ref 0.80–1.20)
PROTHROMBIN TIME: 16.6 s — ABNORMAL HIGH (ref 9.1–13.9)

## 2018-07-29 LAB — CORTISOL, PLASMA OR SERUM: CORTISOL: 16.6 ug/dL

## 2018-07-29 SURGERY — IR PARACENTESIS

## 2018-07-29 MED ORDER — AEROCHAMBER W/ FLOWSIGNAL SPACER
Status: AC
Start: 2018-07-29 — End: 2018-07-30
  Filled 2018-07-29: qty 1

## 2018-07-29 MED ORDER — SPIRONOLACTONE 25 MG TABLET
25.00 mg | ORAL_TABLET | Freq: Two times a day (BID) | ORAL | 0 refills | Status: AC
Start: 2018-07-29 — End: 2018-08-28

## 2018-07-29 MED ORDER — OXYCODONE 5 MG TABLET
7.50 mg | ORAL_TABLET | ORAL | Status: DC | PRN
Start: 2018-07-29 — End: 2018-07-29
  Administered 2018-07-29: 14:00:00 7.5 mg via ORAL
  Filled 2018-07-29: qty 2

## 2018-07-29 MED ORDER — LEVOTHYROXINE 25 MCG TABLET
25.00 ug | ORAL_TABLET | Freq: Every morning | ORAL | Status: DC
Start: 2018-07-29 — End: 2018-07-29
  Administered 2018-07-29: 12:00:00 25 ug via ORAL
  Filled 2018-07-29: qty 1

## 2018-07-29 MED ORDER — FUROSEMIDE 40 MG TABLET
40.0000 mg | ORAL_TABLET | Freq: Every day | ORAL | Status: DC
Start: 2018-07-29 — End: 2018-07-29
  Administered 2018-07-29: 12:00:00 40 mg via ORAL
  Filled 2018-07-29: qty 1

## 2018-07-29 MED ORDER — ALBUTEROL SULFATE 2.5 MG/3 ML (0.083 %) SOLUTION FOR NEBULIZATION
2.50 mg | INHALATION_SOLUTION | Freq: Once | RESPIRATORY_TRACT | Status: DC | PRN
Start: 2018-07-29 — End: 2018-07-29
  Filled 2018-07-29: qty 3

## 2018-07-29 MED ORDER — CARBOPLATIN 10 MG/ML INTRAVENOUS SOLUTION
750.00 mg | Freq: Once | INTRAVENOUS | Status: DC
Start: 2018-07-29 — End: 2018-07-29
  Filled 2018-07-29: qty 75

## 2018-07-29 MED ORDER — DIPHENHYDRAMINE 50 MG/ML INJECTION SOLUTION
25.00 mg | Freq: Once | INTRAMUSCULAR | Status: DC | PRN
Start: 2018-07-29 — End: 2018-07-29

## 2018-07-29 MED ORDER — MEPERIDINE (PF) 25 MG/ML INJECTION SYRINGE
12.50 mg | INJECTION | Freq: Once | INTRAMUSCULAR | Status: DC | PRN
Start: 2018-07-29 — End: 2018-07-29

## 2018-07-29 MED ORDER — SODIUM CHLORIDE 0.9 % INTRAVENOUS SOLUTION
16.00 mg | Freq: Once | INTRAVENOUS | Status: DC
Start: 2018-07-29 — End: 2018-07-29

## 2018-07-29 MED ORDER — ONDANSETRON HCL (PF) 4 MG/2 ML INJECTION SOLUTION
8.00 mg | Freq: Once | INTRAMUSCULAR | Status: DC
Start: 2018-07-29 — End: 2018-07-29
  Administered 2018-07-29: 14:00:00 0 mg via INTRAVENOUS
  Filled 2018-07-29: qty 4

## 2018-07-29 MED ORDER — LORAZEPAM 2 MG/ML INJECTION SOLUTION
0.50 mg | Freq: Four times a day (QID) | INTRAMUSCULAR | Status: DC | PRN
Start: 2018-07-29 — End: 2018-07-29

## 2018-07-29 MED ORDER — LEVOTHYROXINE 25 MCG TABLET
25.00 ug | ORAL_TABLET | Freq: Every morning | ORAL | 0 refills | Status: AC
Start: 2018-07-30 — End: 2018-08-29

## 2018-07-29 MED ORDER — DEXAMETHASONE 4 MG TABLET
8.0000 mg | ORAL_TABLET | Freq: Every day | ORAL | Status: DC
Start: 2018-07-30 — End: 2018-07-29

## 2018-07-29 MED ORDER — LORAZEPAM 0.5 MG TABLET
0.50 mg | ORAL_TABLET | Freq: Four times a day (QID) | ORAL | Status: DC | PRN
Start: 2018-07-29 — End: 2018-07-29

## 2018-07-29 MED ORDER — SPIRONOLACTONE 25 MG TABLET
25.0000 mg | ORAL_TABLET | Freq: Every morning | ORAL | Status: DC
Start: 2018-07-29 — End: 2018-07-29

## 2018-07-29 MED ORDER — OXYCODONE 15 MG TABLET
7.50 mg | ORAL_TABLET | ORAL | 0 refills | Status: DC | PRN
Start: 2018-07-29 — End: 2018-08-06

## 2018-07-29 MED ORDER — FAMOTIDINE (PF) 20 MG/2 ML INTRAVENOUS SOLUTION
20.00 mg | Freq: Once | INTRAVENOUS | Status: DC | PRN
Start: 2018-07-29 — End: 2018-07-29
  Filled 2018-07-29: qty 2

## 2018-07-29 MED ORDER — DEXTROSE 5% IN WATER (D5W) FLUSH BAG - 250 ML
INTRAVENOUS | Status: DC | PRN
Start: 2018-07-29 — End: 2018-07-29

## 2018-07-29 MED ORDER — SODIUM CHLORIDE 0.9% FLUSH BAG - 250 ML
INTRAVENOUS | Status: DC | PRN
Start: 2018-07-29 — End: 2018-07-29

## 2018-07-29 MED ORDER — PROCHLORPERAZINE EDISYLATE 10 MG/2 ML (5 MG/ML) INJECTION SOLUTION
10.00 mg | Freq: Four times a day (QID) | INTRAMUSCULAR | Status: DC | PRN
Start: 2018-07-29 — End: 2018-07-29

## 2018-07-29 MED ORDER — PROCHLORPERAZINE MALEATE 5 MG TABLET
10.00 mg | ORAL_TABLET | Freq: Four times a day (QID) | ORAL | Status: DC | PRN
Start: 2018-07-29 — End: 2018-07-29

## 2018-07-29 MED ORDER — APREPITANT 7.2 MG/ML INTRAVENOUS EMULSION
130.00 mg | Freq: Once | INTRAVENOUS | Status: DC
Start: 2018-07-29 — End: 2018-07-29
  Filled 2018-07-29: qty 18

## 2018-07-29 MED ORDER — POTASSIUM CHLORIDE ER 20 MEQ TABLET,EXTENDED RELEASE(PART/CRYST)
40.0000 meq | ORAL_TABLET | ORAL | Status: AC
Start: 2018-07-29 — End: 2018-07-29
  Administered 2018-07-29 (×2): 40 meq via ORAL
  Filled 2018-07-29 (×2): qty 2

## 2018-07-29 MED ORDER — DEXAMETHASONE SODIUM PHOSPHATE 10 MG/ML INJECTION SOLUTION
12.00 mg | Freq: Once | INTRAMUSCULAR | Status: DC
Start: 2018-07-29 — End: 2018-07-29
  Filled 2018-07-29: qty 1.2

## 2018-07-29 MED ORDER — DIPHENHYDRAMINE 50 MG/ML INJECTION SOLUTION
50.00 mg | Freq: Once | INTRAMUSCULAR | Status: DC | PRN
Start: 2018-07-29 — End: 2018-07-29

## 2018-07-29 MED ORDER — ALBUTEROL SULFATE HFA 90 MCG/ACTUATION AEROSOL INHALER
2.00 | INHALATION_SPRAY | Freq: Once | RESPIRATORY_TRACT | Status: DC | PRN
Start: 2018-07-29 — End: 2018-07-29
  Filled 2018-07-29: qty 8

## 2018-07-29 MED ORDER — SPIRONOLACTONE 25 MG TABLET
25.00 mg | ORAL_TABLET | Freq: Two times a day (BID) | ORAL | Status: DC
Start: 2018-07-29 — End: 2018-07-29
  Administered 2018-07-29 (×2): 25 mg via ORAL
  Filled 2018-07-29 (×2): qty 1

## 2018-07-29 MED ORDER — HYDROCORTISONE SOD SUCCINATE 100 MG/2 ML VIAL WRAPPER
100.00 mg | Freq: Once | INTRAMUSCULAR | Status: DC | PRN
Start: 2018-07-29 — End: 2018-07-29
  Filled 2018-07-29: qty 2

## 2018-07-29 MED ORDER — EPINEPHRINE 1 MG/ML (1 ML) INJECTION SOLUTION
0.30 mg | Freq: Once | INTRAMUSCULAR | Status: DC | PRN
Start: 2018-07-29 — End: 2018-07-29

## 2018-07-29 MED ADMIN — furosemide 40 mg tablet: ORAL | @ 12:00:00

## 2018-07-29 MED ADMIN — benzonatate 100 mg capsule: ORAL | @ 14:00:00

## 2018-07-29 MED ADMIN — atovaquone 750 mg/5 mL oral suspension: ORAL | @ 08:00:00

## 2018-07-29 MED ADMIN — dolutegravir 50 mg tablet: ORAL | @ 08:00:00

## 2018-07-29 MED ADMIN — spironolactone 25 mg tablet: ORAL | @ 17:00:00

## 2018-07-29 MED ADMIN — potassium chloride ER 20 mEq tablet,extended release(part/cryst): ORAL | @ 08:00:00

## 2018-07-29 MED ADMIN — sodium chloride 0.9 % (flush) injection syringe: INTRAVENOUS | @ 06:00:00

## 2018-07-29 MED ADMIN — aspirin 81 mg tablet,delayed release: ORAL | @ 08:00:00

## 2018-07-29 MED ADMIN — benzonatate 100 mg capsule: ORAL | @ 08:00:00

## 2018-07-29 MED ADMIN — insulin regular human 100 unit/mL injection solution: SUBCUTANEOUS | @ 06:00:00

## 2018-07-29 MED ADMIN — sodium chloride 0.9 % intravenous solution: ORAL | @ 12:00:00

## 2018-07-29 MED ADMIN — sodium chloride 1 gram tablet: @ 17:00:00

## 2018-07-29 MED ADMIN — potassium chloride ER 20 mEq tablet,extended release(part/cryst): ORAL | @ 12:00:00

## 2018-07-29 MED ADMIN — spironolactone 25 mg tablet: ORAL | @ 12:00:00

## 2018-07-29 MED ADMIN — sucrose (bulk) powder: INTRAVENOUS | @ 01:00:00

## 2018-07-29 MED ADMIN — sodium chloride 0.9 % intravenous solution: INTRAVENOUS | @ 14:00:00

## 2018-07-29 MED ADMIN — VANCOMYCIN IVPB - 10G VIAL PREP: @ 20:00:00

## 2018-07-29 MED ADMIN — morphine 2 mg/mL injection syringe: ORAL | @ 08:00:00

## 2018-07-29 NOTE — Nurses Notes (Signed)
Tele called to report pt had a 4 beat run of Vtach then converted back to normal sinus. Pt is asymptomatic. Currently running normal sinus 95 on monitor. Service notified. Will continue to monitor.

## 2018-07-29 NOTE — Progress Notes (Addendum)
INTERNAL MEDICINE  Progress Note     Name: Thomas Barry, Thomas Barry, 53 y.o. 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  The pt is here for several reasons, Chemo tx and leg edema.     The pt wants to talk to his primary oncologist, Dr. Ardeen Garland for goal and plan of care.   The pt is emotional today but denies any suicidal ideation.     OBJECTIVE:     Temperature: 36.7 C (98.1 F) Heart Rate: 90 BP (Non-Invasive): 113/65   Respiratory Rate: 17 SpO2: 96 % Pain Score (Numeric, Faces): 8        Constitutional: NAD. Orientedx3 Teary at a time.   HENT: NC/AT, Mucous membranes moist.   Neck: Trachea midline. Neck supple.  Cardiovascular: RRR, No murmurs, rubs or gallops.  Pulmonary/Chest: Decreased BS at R base. No wheezes, rales or rhonchi.   Abdominal: BS +. Abdomen, severely distended.   No rebound or guarding.               Musculoskeletal:  No joint aches or swelling of major joints  Extremities: 3+ edema bilaterally, No cyanosis, tenderness or deformity.  Skin: Jaundiced.   Psychiatric: tearing at a time during the conversation.   Neurological: Alert. Grossly intact.    Current Facility-Administered Medications   Medication Dose Route Frequency   . aspirin (ECOTRIN) enteric coated tablet 81 mg  81 mg Oral Daily   . [Held by provider] atovaquone (MEPRON) 735m per 552moral liquid  1,500 mg Oral Daily with Breakfast   . benzonatate (TESSALON) capsule  100 mg Oral 3x/day   . dolutegravir (TIVICAY) tablet  50 mg Oral Daily   . emtricitabine-tenofovir (DESCOVY) 20057m5mg per tablet  1 Tab Oral Daily   . enoxaparin PF (LOVENOX) 40 mg/0.4 mL SubQ injection  40 mg  Subcutaneous Q24H   . heparin flush (HEPLOCK) 100 units/mL injection  5 mL Intracatheter Daily PRN   . metoprolol tartrate (LOPRESSOR) tablet  25 mg Oral 2x/day   . NS flush syringe  10-20 mL Intravenous Q8HRS   . NS flush syringe  20-30 mL Intravenous Q1 MIN PRN   . ondansetron (ZOFRAN) 2 mg/mL injection  4 mg Intravenous Q8H PRN   . oxyCODONE (ROXICODONE) immediate release tablet  5 mg Oral Q4H PRN   . polyethylene glycol (MIRALAX) oral packet  17 g Oral Daily PRN   . potassium chloride (K-DUR) extended release tablet  40 mEq Oral Q2H   . sodium chloride tablet  1 g Oral 3x/day-Meals   . SSIP insulin R human (HUMULIN R) 100 units/mL injection  0-12 Units Subcutaneous 4x/day PRN         LABS:       CBC Results Differential Results   Recent Labs     07/28/18  1009 07/29/18  0433   WBC 7.2 6.8   HGB 11.3* 10.6*   HCT 35.0* 32.6*   PLTCNT 356  356 323    Recent Labs     07/28/18  1009 07/29/18  0433   PMNS  58 56   MONOCYTES 14 15   BASOPHILS 1  <0.10 1  <0.10   PMNABS 4.18  4.18 3.92   LYMPHSABS 1.75 1.67   MONOSABS 1.00 0.99   EOSABS <0.10 <0.10      BMP Results Other Chemistries Results   Recent Labs     07/28/18  1009 07/28/18  1341 07/29/18  0433   SODIUM 131* 132* 133*   POTASSIUM 2.7* 2.9* 3.2*   CHLORIDE  --  100 101   CO2  --  22 24   BUN  --  13 11   CREATININE 0.78 0.64 0.66   GFR >60 >60 >60   ANIONGAP  --  10 8    Recent Labs     07/28/18  1009 07/28/18  1009 07/28/18  1341 07/29/18  0433   CALCIUM  --  8.8 9.1 8.6   ALBUMIN 2.5*  --   --  2.2*   MAGNESIUM  --  1.5* 1.5* 1.9   PHOSPHORUS  --  1.9* 1.8*  --       Coagulation Studies:  No results found for this encounter    Liver/Pancreas:  Recent Labs     07/28/18  1009 07/29/18  0433   TOTALPROTEIN  --  5.8*   ALBUMIN  --  2.2*   TOTBILIRUBIN 9.8* 9.4*   BILIRUBINCON  --  7.1*   AST 119* 118*   ALT 176* 167*   ALKPHOS 762* 775*   LDH 534*  --        ESR / CRP: No results found for this encounter    Procalcitonin: No results found for: PRL    LDH:      Recent Labs     07/28/18  1009   LDH 534*       Blood Gas:  No results found for this encounter     Cardiac Markers:  Recent Labs     07/28/18  1444   BNP 68       Thyroid Function:  Lab Results   Component Value Date/Time    TSH 0.043 (L) 07/28/2018 01:41 PM    FREET4 0.65 (L) 07/28/2018 01:41 PM       Lipid Panel:  No results found for this encounter    Urinalysis:  No results found for this encounter    Urine Drug Screen:  No results for input(s): AMPHETUR, BARBUR, BENZOUR, COCMETUR, METHADONEUR, OPIATEURINE, PHENCYCUR, PROPOXYPHEUR, CANNABINUR in the last 72 hours.     IMAGING / STUDIES:      CT BRAIN W/WO IV CONTRAST   Final Result   18 mm enhancing mass in the suprasellar cistern, increased in size as   compared to the previous MRI from 05/25/2018.  Given the patient's history   of primary malignancy, a metastasis is most likely.         CT CHEST ABDOMEN PELVIS W IV CONTRAST   ED Interpretation   No acute abnormality within the abdomen or pelvis. Redemonstration of extensive hepatic metastatic disease with para-aortic and porta hepatis adenopathy. Slightly increased ascites when compared to the prior study. Numerous foci of increased attenuation within the gastric lumen likely related to ingested contents.      Preliminary Result   No evidence of acute process throughout the chest, abdomen, and   pelvis.                ASSESSMENT/PLAN:     Patient Active Problem List    Diagnosis   .  Leg edema, right   . Hypokalemia   . Depression   . Hepatitis B   . Hx of CABG   . Hypomagnesemia   . Hypophosphatemia   . Hyponatremia   . Secondary hypothyroidism   . Hyperbilirubinemia   . Morbid obesity   . Small cell lung cancer (CMS HCC)   . HIV (human immunodeficiency virus infection) (CMS HCC)   . CAD (coronary artery disease)   . DM (diabetes mellitus) (CMS HCC)      RLE Edema, fluid overload, abdominal distention--> Anasarca/ ascites due to LFTs abnormality  - duplex negative PTA  - TTE ordered, patient reports he had  one 6 months ago at [facility] and "it was normal." Medical records requested.   - CT CAP showed increased ascites and multiple liver mets which is known.   - Start on aldactone and lasix.   - Ask IR to proceed to paracentesis but wait for the final decision for the goal of tx. If he is not going to be treated and going for hospice, he may need pig tail. This was discussed with the pt and agreeable to wait for the further discussion with supportive care and Dr. Ardeen Garland.     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   - patient discussing possible chemotherapy treatment withdrawal and supportive care consulted by Dr. Concepcion Living.   - CT head with IV contrast showed enlargement of mets lesion supra sella cistern.  - Discussed with Dr. Ardeen Garland and will consult Rad onco.( The pt saw rad onco in [facility] so will need to consult Rad onco today.)  -  Endocrine on board.   - Hold carbo tx today as per Dr. Ardeen Garland.     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 Synthroid started.   Morning cortisol level is 16.6. No HC replacement at this time.     T2DM  - A1C 7.7 06/12/2018.   - SSI low     Electrolyte imbalance, Hypokalemia, Hypomagnesemia, Hypophosphatemia, Hyponatremia  - will replete PRN    Depression, Denies SI to me:  - not on home medications; stopped 2 weeks ago and does not want to restart  - discussed with psych. The pt declined chemical and cognitive tx.   - patient has [place] and requests to be made DNR/DNI, no tube feed    HIV  - follows with ID outpatient  - continue home medications    DVT/PE Prophylaxis: Enoxaparin and SCDs/ Venodynes/Impulse boots

## 2018-07-29 NOTE — OR Surgeon (Signed)
VASCULAR & INTERVENTIONAL RADIOLOGY    Post-Procedure Note    Patient Name: [facility] Number: [id number]  Date of Birth: 1966/01/23  Date of Service: 07/29/2018    PROCEDURE:  Ultrasound exam for paracentesis  Attending:Robert Lise Auer, MD  Assistant: Rema Jasmine, APRN   Preop Diagnosis:  Anasarca  Postop Diagnosis:same  Anesthesia: None  Estimated Blood Loss: None  Specimens Removed: None    FINDINGS/INTERVENTIONS/COMPLICATIONS:     The patient was brought to the interventional radiology suite to evaluate for a possible therapeutic paracentesis.  A diagnostic ultrasound exam of the abdomen was performed which revealed trace perihepatic ascites and no appreciable collection of ascites minimal to paracentesis.    No procedure was performed.    Full dictation to follow    RECOMMENDATIONS & FOLLOW-UP: Per primary team.    Roxanne Mins, APRN  07/29/2018, 16:17

## 2018-07-29 NOTE — Consults (Signed)
[facility] Consult  Follow Up Note    Thomas Barry, Thomas Barry, 53 y.o. male  Date of Service: 07/29/2018  Date of Birth:  1966/01/23    Hospital Day:  LOS: 0 days     Assessment/Recommendations: Met with patient for completion of [place] POLST form.  POLST completed with the following orders: No CPR/DNR, Limited Additional Interventions, No Feeding tube, IV Fluids for a trial period, Determine use or limitation of antibiotics when infection occurs with comfort as goal.  POLST will be placed on chart once signed by MD.     Perlie Gold, MSW

## 2018-07-29 NOTE — Consults (Signed)
INTERVENTIONAL RADIOLOGY  INITIAL BRIEF CONSULT NOTE      Patient Name: Thomas Barry  Patient MRN: [id number]  Patient DOB: November 12, 1965  Date of Service:07/29/2018    Consult Requested By: HOSPITALIST 12 ONCOLOGY    HPI:  This is a 53 year old male with a history of metastatic small cell lung carcinoma with anasarca.  IR consulted for a possible paracentesis.     Imaging reviewed and paracentesis is amenable.      - No need for NPO  - Consent needs to be obtained.  - Please obtain INR  - IR case request, will perform today if INR < 2.0    Discussed with attending and approved  Time to be determined  Contact IR resident with patient status updates: Pager [phone]  Contact IR charge nurse for questions regarding scheduling: Phone [phone]  Contact CT nurse for questions regarding scheduling: Phone [place], APRN  07/29/2018, 13:53

## 2018-07-29 NOTE — Care Management Notes (Signed)
[facility] Initial Evaluation    Patient Name: Thomas Barry  Date of Birth: November 17, 1965  Sex: male  Date/Time of Admission: 07/28/2018 12:26 PM  Room/Bed: 903/A  Payor: Holland Falling / Plan: AETNA PPO / Product Type: PPO /   Primary Care Providers:  Thomas Rung, DO (General)    Pharmacy Info:   Preferred [facility]acy     [id number]   Phone: [phone]: [phone]   Not a 24 hour pharmacy; exact hours not known.    [id number]   Phone: [phone]: [phone]   Not a 24 hour pharmacy; exact hours not known.        Emergency Contact Info:   Extended Emergency Contact Information  Primary Emergency Contact: ThomasMarna  Mobile Phone: [phone] Relation: Sister  Preferred language: English  Interpreter needed? No    History:   CNaaman Currois a 53y.o., male, admitted from cancer center for chemotherapy and leg edema.    Height/Weight: 175.3 cm (5' 9.02") / 124 kg (274 lb 0.5 oz)     LOS: 0 days   Admitting Diagnosis: Leg edema, right [R60.0]    Assessment:      07/29/18 1524   Assessment Details   Assessment Type Admission   Date of Care Management Update 07/29/18   Date of Next DCP Update 07/31/18   Readmission   Is this a readmission? No   Care Management Plan   Discharge Planning Status initial meeting   Projected Discharge Date 08/05/18   Discharge plan discussed with: Patient   Discharge Needs Assessment   Equipment Currently Used at Home none   Equipment Needed After Discharge other (see comments)  (TBD)   Discharge Facility/Level of Care Needs Home (Patient/Family Member/other)(code 1)   Transportation Available family or friend will provide   Referral Information   Admission Type observation   Address Verified verified-no changes   [facility] cancer  center[facility]   Insurance Verified verified-no change   ADVANCE DIRECTIVES   Does the Patient have an Advance Directive? Yes, Patient Does Have Advance Directive for Healthcare Treatment   Document the Substance of the Advance Directive (Required) MPOA   Type of Advance Directive Completed Medical Power of Attorney   Copy of Advance Directives in Chart? 0   Name of [place]  Phone Number of [place] Healthcare Surrogate [phone]  Employment/Financial   Patient has Prescription Coverage?  Yes        Name of Insurance Coverage for Medications Aetna   Financial Concerns none   Living Environment   Select an age group to open "lives with" row.  Adult   Lives With parent(s)   Living Arrangements house   Able to Return to Prior Arrangements yes   Home Safety   Home Assessment: No Problems Identified   Home Accessibility stairs to enter home;bed and bath on same level   Legal Issues   Do you have a court appointed guardian/conservator? No   Home Main Entrance   Number of Stairs, Main Entrance one   Pt is a 53y/o male admitted to hospital from cancer center for chemotherapy and leg edema. Supportive care following, working on  goal clarification.    Discharge Plan:  Home (Patient/Family Member/other) (code 1)  MSW met with pt at bedside to complete initial assessment. Address and phone verified per pt. Pt reports he lives in a farmhouse with his parents, however he has his own section of the house that has one step to enter. Pt reports his parents will provide transportation upon d/c. Pt is insured via [employer] and states he has prescription coverage. Pt states he uses a daily pill box to assist in medication management [facility] [place]. Pt states his PCP is Dr. Trixie Rude (pt unsure of spelling) at [facility] in [place], reports he last saw PCP on Monday. Pt is not active with HH, uses no DME in the home. Pt did not have MPOA on file, MSW provided pt  with information, pt completed MPOA notarized by MSW [employer] and witnessed by MSW Kayleen Memos and [facility]. Pt appointed sister Thomas Barry [phone]). Copies provided to pt, copy on chart for scanning. Lay caregiver addressed by bedside nurse. MSW will continue to follow.    The patient will continue to be evaluated for developing discharge needs.     Case Manager: Vicente Serene, Sarah Ann  Phone: [phone]

## 2018-07-29 NOTE — Care Management Notes (Deleted)
[facility] Management Initial Evaluation    Patient Name: Thomas Barry  Date of Birth: 07/08/1965  Sex: male  Date/Time of Admission: 07/28/2018 12:26 PM  Room/Bed: 903/A  Payor: Holland Falling / Plan: AETNA PPO / Product Type: PPO /   Primary Care Providers:  Thomas Rung, DO (General)    Pharmacy Info:   Preferred Pharmacy     [id number]   [id number]   Phone: [phone]: [phone]   Not a 24 hour pharmacy; exact hours not known.    [id number]   Phone: [phone]: [phone]   Not a 24 hour pharmacy; exact hours not known.        Emergency Contact Info:   Extended Emergency Contact Information  Primary Emergency Contact: Thomas Barry  Mobile Phone: [phone] Relation: Sister  Preferred language: English  Interpreter needed? No    History:   CDoyel Mulkernis a 53y.o., male, admitted from cancer center for chemotherapy and leg edema.    Height/Weight: 175.3 cm (5' 9.02") / 124 kg (274 lb 0.5 oz)     LOS: 0 days   Admitting Diagnosis: Leg edema, right [R60.0]    Assessment:      07/29/18 1524   Assessment Details   Assessment Type Admission   Date of Care Management Update 07/29/18   Date of Next DCP Update 07/31/18   Readmission   Is this a readmission? No   Care Management Plan   Discharge Planning Status initial meeting   Projected Discharge Date 08/05/18   Discharge plan discussed with: Patient   Discharge Needs Assessment   Equipment Currently Used at Home none   Equipment Needed After Discharge other (see comments)  (TBD)   Discharge Facility/Level of Care Needs Home (Patient/Family Member/other)(code 1)   Transportation Available family or friend will provide   Referral Information   Admission Type inpatient   Address Verified verified-no changes   Arrived From cancer  center/children's [facility]   Insurance Verified verified-no change   ADVANCE DIRECTIVES   Does the Patient have an Advance Directive? Yes, Patient Does Have Advance Directive for Healthcare Treatment   Document the Substance of the Advance Directive (Required) MPOA   Type of Advance Directive Completed Medical Power of Attorney   Copy of Advance Directives in Chart? 0   Name of [name]  Phone Number of Thomas Barry Healthcare Surrogate [phone]  Employment/Financial   Patient has Prescription Coverage?  Yes        Name of Insurance Coverage for Medications Aetna   Financial Concerns none   Living Environment   Select an age group to open "lives with" row.  Adult   Lives With parent(s)   Living Arrangements house   Able to Return to Prior Arrangements yes   Home Safety   Home Assessment: No Problems Identified   Home Accessibility stairs to enter home;bed and bath on same level   Legal Issues   Do you have a court appointed guardian/conservator? No   Home Main Entrance   Number of Stairs, Main Entrance one   Pt is a 53y/o male admitted to hospital from cancer center for palliative chemotherapy and leg edema. Supportive care following, clarifying  goals.    Discharge Plan:  Home (Patient/Family Member/other) (code 1)  MSW met with pt at bedside to complete initial assessment. Address and phone verified per pt. Pt reports he lives in a farmhouse with his parents, however he has his own section of the house that has one step to enter. Pt reports his parents will provide transportation upon d/c. Pt is insured via [employer] and states he has prescription coverage. Pt states he uses a daily pill box to assist in medication management [facility] [place] Pt states his PCP is Dr. Trixie Rude (pt unsure of spelling) at [facility] in [place], reports he last saw PCP on Monday. Pt is not active with HH, uses no DME in the home. Pt did not have MPOA on file, MSW provided pt  with information, pt completed MPOA notarized by MSW Thomas Barry and witnessed by MSW Thomas Barry and Thomas Barry. Pt appointed sister Thomas Barry [phone]). Copies provided to pt, copy on chart for scanning. Lay caregiver addressed by bedside nurse. MSW will continue to follow.    The patient will continue to be evaluated for developing discharge needs.     Case Manager: Thomas Barry, [place]  Phone: [phone]

## 2018-07-29 NOTE — Transitional Care (Signed)
Hospital follow up appointment not needed as pt was here for inpatient chemo. Discharge date/time along with coordinators phone number added to after visit summary.  Hem/Onc follow-up is currently scheduled for Thursday, 4/16.    [place], LPN  06/26/2900, 11:15

## 2018-07-29 NOTE — Discharge Summary (Addendum)
[facility]  DISCHARGE SUMMARY    PATIENT NAME:  Thomas Barry, Thomas Barry  MRN:  [id number]  DOB:  Aug 23, 1965    ENCOUNTER DATE:  07/28/2018  INPATIENT ADMISSION DATE:   DISCHARGE DATE:  07/29/2018    ATTENDING PHYSICIAN: Philemon Kingdom, MD  SERVICE: HOSPITALIST 12 ONCOLOGY  PRIMARY CARE PHYSICIAN: Janalee Dane, DO         LAY CAREGIVER: [place] Caregiver Name: Jarrius, [place] Caregiver Contact Number: [phone], Lay Caregiver Relationship to patient: parent      PRIMARY DISCHARGE DIAGNOSIS: Leg edema, right  Active Hospital Problems    Diagnosis Date Noted   . Principle Problem: Leg edema, right [R60.0] 07/28/2018   . Hypokalemia [E87.6] 07/28/2018   . Depression [F32.9] 07/28/2018   . Hepatitis B [B19.10] 07/28/2018   . Hx of CABG [Z95.1] 07/28/2018   . Hypomagnesemia [E83.42] 07/28/2018   . Hypophosphatemia [E83.39] 07/28/2018   . Hyponatremia [E87.1] 07/28/2018   . 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 Morbid obesity [E66.01] 06/13/2018   . Small cell lung cancer (CMS [place]) [C34.90] 06/12/2018   . HIV (human immunodeficiency virus infection) (CMS [place]) [B20] 06/12/2018   . DM (diabetes mellitus) (CMS [place]) [E11.9] 06/12/2018      Resolved Hospital Problems   No resolved problems to display.     Active Non-Hospital Problems    Diagnosis Date Noted   . Hyperbilirubinemia 06/21/2018   . CAD (coronary artery disease) 06/12/2018        DISCHARGE MEDICATIONS:     Current Discharge Medication List      START taking these medications.      Details   levothyroxine 25 mcg Tablet  Commonly known as:  SYNTHROID  Start taking on:  July 30, 2018   25 mcg, Oral, EVERY MORNING  Qty:  30 Tab  Refills:  0     spironolactone 25 mg Tablet  Commonly known as:  ALDACTONE   25 mg, Oral, 2 TIMES DAILY WITH FOOD  Qty:  60 Tab  Refills:  0        CONTINUE these medications which have CHANGED during your visit.      Details   oxyCODONE 15 mg Tablet  Commonly known as:  ROXICODONE  What changed:     medication strength   how much to  take   reasons to take this   7.5 mg, Oral, EVERY 4 HOURS PRN  Qty:  60 Tab  Refills:  0        CONTINUE these medications - NO CHANGES were made during your visit.      Details   aspirin 81 mg Tablet, Delayed Release (E.C.)  Commonly known as:  ECOTRIN   81 mg, Oral, DAILY  Refills:  0     benzonatate 100 mg Capsule  Commonly known as:  TESSALON   100 mg, Oral, EVERY 4 HOURS PRN  Refills:  0     buPROPion 300 mg Tablet Sustained Release 24 hr  Commonly known as:  WELLBUTRIN XL   300 mg, Oral, DAILY  Refills:  0     Descovy 200-25 mg Tablet  Generic drug:  emtricitabine-tenofovir alafen   1 Tab, Oral, DAILY  Refills:  0     dolutegravir 50 mg Tablet  Commonly known as:  TIVICAY   50 mg, Oral, DAILY  Refills:  0     empagliflozin 25 mg Tablet  Commonly known as:  Jardiance   25 mg, Oral, DAILY  Qty:  90 Tab  Refills:  1     furosemide 20 mg Tablet  Commonly known as:  LASIX   20 mg, Oral, DAILY PRN  Qty:  30 Tab  Refills:  0     metoprolol tartrate 25 mg Tablet  Commonly known as:  LOPRESSOR   25 mg, Oral, 2 TIMES DAILY  Qty:  180 Tab  Refills:  1     MIRALAX ORAL   1 Packet, Oral, DAILY PRN  Refills:  0     ondansetron 4 mg Tablet  Commonly known as:  ZOFRAN   4 mg, Oral, EVERY 8 HOURS PRN  Refills:  0     promethazine 25 mg Tablet  Commonly known as:  PHENERGAN   25 mg, Oral, EVERY 8 HOURS PRN  Refills:  0     sertraline 100 mg Tablet  Commonly known as:  ZOLOFT   100 mg, Oral, DAILY  Refills:  0     sodium chloride 1 gram Tablet   1 g, Oral, 3 TIMES DAILY WITH MEALS, 2 tabs   Refills:  0     triamcinolone acetonide 0.1 % Ointment  Commonly known as:  ARISTOCORT A   Apply Topically, 2 TIMES DAILY PRN  Qty:  454 g  Refills:  5        STOP taking these medications.    atovaquone 750 mg/5 mL Suspension  Commonly known as:  MEPRON     cephalexin 500 mg Capsule  Commonly known as:  KEFLEX     MetFORMIN 1,000 mg Tablet  Commonly known as:  GLUCOPHAGE          Discharge med list refreshed?  YES     During this  hospitalization did the patient have an AMI, PCI/PCTA, STENT or Isolated CABG?  No                            ALLERGIES:  Allergies   Allergen Reactions   . Sulfa (Sulfonamides) Rash and Itching             HOSPITAL PROCEDURE(S):   Bedside Procedures:  No orders of the defined types were placed in this encounter.    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  The pt is here for several reasons, Chemo tx and leg edema.     BRIEF HOSPITAL COURSE:     RLE Edema, fluid overload, abdominal distention--> Anasarca/ ascites due to severe liver mets.  The pt had duplex doppler of legs which were negative prior to admission.   TTE showed normal EF and normal wall motion.   CTCAP showed increased ascites and multiple liver mets which was known.   The pt was seen by supportive care, Dr. Sidney Ace who has been seeing the pt as an outpatient. Dr. Sidney Ace requested the pt to increase oxycodone for better pain control and a possible transition into palliative/hospice as an outpatient. I consulted IR who is going to remove ascitic fluid today. The pt was started on aldactone and lasix. Although CT A/P showed moderate ascites and clinical findings of ascites, IR could not find a pocked of fluid as per RN who got a  report from IR and did not have paracentesis.   Dr. Ardeen Garland was involved in the decision making process here and he will defer Carboplatin tx at this time.       MetastaticSCLCto Brain, bone and liver, currently on palliative chemotherapy after palliative radiation completed 05/2018  The pt was planned for chemo with carboplatin on 07/29/2018 but will delay tx plan at this time after discussion with oncology.   CT head with IV  contrast showed enlargement of mets lesion supra sella cistern. The pt saw Dr. Yates Decamp, Rad onco prior to discharge. He will discuss the plan of care with his family and let Dr. Yates Decamp know if he is going to proceed to whole brain radiation or not.     Central Hypothyroid:  TSH and Free T4 suppressed consistent with to mets causing central hypothyroid.    Morning cortisol level is 16.6. No HC replacement at this time.     T2DM  A1C 7.7 06/12/2018.   The pt was placed on low dose sliding scale here and advised to hold using glucopahge due to LF abnormality.     Electrolyte imbalance,Hypokalemia, Hypomagnesemia, Hypophosphatemia, Hyponatremia  - Replaced and will need to have follow up blood work as an outpatient.     Depression, Denies SI to me:  The pt was not on home medications; stopped 2 weeks ago and does not want to restart.  Psych saw the pt and the pt declined chemical and cognitive tx.   The patient has [facility] and requests to be made DNR/DNI, no tube feed.     HIV  The pt follows with ID outpatient  His Atvaquone was stopped due to liver toxicity and his most recent CD4 count was >400.          TRANSITION/POST DISCHARGE CARE/PENDING TESTS/REFERRALS:   - Follow up with Dr. Ardeen Garland in one week.   - Follow up with Dr. Sidney Ace in one week as scheduled on 4/16.  - Follow up with XRT, Dr. Yates Decamp as directed on this Monday, 4/13.   - Follow up with Endocrine in one mo.         CONDITION ON DISCHARGE:  A. Ambulation: Full ambulation  B. Self-care Ability: Complete  C. Cognitive Status Alert and Oriented x 3  D. Code status at discharge:   Code Status Information     Code Status    No CPR                 LINES/DRAINS/WOUNDS AT DISCHARGE:   Patient Lines/Drains/Airways Status    Active Line / Dialysis Catheter / Dialysis Graft / Drain / Airway / Wound     Name: Placement date: Placement time: Site: Days:    Implantable Port Right Chest  07/07/18   0900   22                DISCHARGE DISPOSITION:  Home  discharge              DISCHARGE INSTRUCTIONS:  Follow-up Information     Janalee Dane, DO In 1 week.    Specialty:  INTERNAL MEDICINE  Why:  Hospital discharge follow up.  Contact information:  [place]  [place] [address]  [phone]             [facility] [place].    Specialty:  Oncology  Contact information:  [place]  [phone]  Additional information:  The [facility],  also known as [facility]) is located on the [facility] in [place], [place]. Below are driving directions, however, if you need additional information, please call [phone]-CARE [phone]) or you may visit our website at [email or web address].*From [place] or South:*Take exit #155 toward [place]. Continue on [address] Make a left onto [address] ([place] [address]) at the [place]. At fourth traffic light, make right onto [address]. At the traffic circle, keep right, towards [facility]. *From [place] or South:*Take Exit #7, [address]. Follow signs for [place] and [place]. Continue up the hill and drive past the airport (will be on your left), bear right at the roundabout onto [address], following [place] [address] and turn left onto [address]. Turn right at the next traffic light, [address], Continue past [facility] and follow sign to the [place].*The [facility] entrance is located on the right side after the guard booth and parking garage. Please enter the facility under the canopy directly behind the flagpoles. Valet parking is available to patients at [place] outpatient clinics free of charge and tipping is not required. Please pull under canopy to take advantage of this service.           [facility], [address] [facility].    Specialty:  Thoracic Oncology  Contact information:  [place] [address]  [phone]  Additional information:  The [facility], also known as [facility]) is located on the [facility] in [place], [place]. Below are driving directions, however, if you need additional information, please call [phone]-CARE [phone]) or you may visit our website at [email or web address].*From [place] or South:*Take exit #155 toward [place]. Continue on [address]. Make a left onto [address] ([place] [address]) at the [place]. At fourth traffic light, make right onto [address]. At the traffic circle, keep right, towards [facility]. *From [place] or South:*Take Exit #7, [address]. Follow signs for [place] and [place]. Continue up the hill and drive past the airport (will be on your left), bear right at the roundabout onto [place], following [place] [address] and turn left onto [address]. Turn right at the next traffic light, [address], Continue past [facility] and follow sign to the [place].*The [facility] entrance is located on the right side after the guard booth and parking garage. Please enter the facility under the canopy directly behind the flagpoles. Valet parking is available to patients at [place] outpatient clinics free of charge and tipping is not required. Please pull under canopy to take advantage of this service.           [facility], [place].    Specialty:  Medicine Endocrinology  Contact information:  [place] [address]  [phone]  Additional information:  From [address]: Take [address]. If traveling [place], turn left on [address]; if traveling [place], keep right at the fork and merge onto [address]. Keep right onto [facility], turn left on [address], continue  onto [address], turn right onto [address], at approximately 1.5 miles, turn right onto [address]. Take the next right onto [address]. The clinic will be located across from [place] on the left. *Please complete any needed labs 7-10 days prior to your appointment. Lab services are available at the following locations. *[place], [facility] on the left across from [employer].                  DISCHARGE INSTRUCTION - DIET     Diet: RESUME HOME DIET      DISCHARGE INSTRUCTION - DIABETIC DIET     Diet: DIABETIC DIET      DISCHARGE INSTRUCTION - ACTIVITY     Activity: AS TOLERATED      SCHEDULE FOLLOW-UP - MGP TRANSITION OF CARE - [place]     Reason for visit: HOSPITAL DISCHARGE    Follow-up in: 1 WEEK    Follow-up reason: Hospital F/U    Coordination with other outpt. appts.? YES (provide details in comment section & place separate orders for those items)      PLEASE KEEP FOLLOW-UP APPT ALREADY SCHEDULED IN SUPPORTIVE [facility]    Please keep your appointment with your follow-up provider. If for some reason you need to cancel, be sure to reschedule as this is important for your care.     Department SUPPORTIVE [facility] [[id number]]      SCHEDULE FOLLOW-UP CANCER CENTER PROVIDER Merrie Roof MD     PROVIDER Merrie Roof MD    Is this a NEW or RETURN visit? RETURN    Follow-up in: 1 WEEK      SCHEDULE FOLLOW-UP - ENDOCRINOLOGY - [facility]     Follow-up in: [place]    Reason for visit: HOSPITAL DISCHARGE                 Philemon Kingdom, MD    Copies sent to Care Team       Relationship Specialty Notifications Start End    Janalee Dane, DO PCP - General INTERNAL MEDICINE  07/29/18     Phone: [phone] Fax: [place] [address]    Swager, Frederic Jericho, RN Registered Nurse [place], [place]  Admissions 06/12/18             Referring providers can utilize [email or web address] to access their referred [place] patient's information.

## 2018-07-29 NOTE — Nurses Notes (Signed)
Orders for patient to be discharged to home. Discharge instructions reviewed. Follow up appointments made. Pt educated on medications and where to pick them up. Port access removed.   Family to drive patient home. The patient denies any questions or concerns.     Bluford Kaufmann, RN  07/29/2018, 17:35

## 2018-07-29 NOTE — Consults (Signed)
[facility]  COVID-19 Endocrinology E-Consult  Follow Up Note    Thomas Barry, Thomas Barry, 53 y.o. male  Date of Service: 07/29/2018  Date of Birth:  [birth year]    Hospital Day:  LOS: 0 days     Chief Complaint: Central Hypothyroidism    Chart thoroughly reviewed. Physical exam not done. Due to COVID-19 pandemic, hospital social distancing policy includes limiting face-to-face inpatient consults when possible. Encounter completed by E-consult.    Current Facility-Administered Medications   Medication Dose Route Frequency Provider Last Rate Last Dose   . aspirin (ECOTRIN) enteric coated tablet 81 mg  81 mg Oral Daily Maize, Kudora, APRN   81 mg at 07/29/18 0758   . [Held by provider] atovaquone (MEPRON) 750mg  per 24mL oral liquid  1,500 mg Oral Daily with Breakfast Maize, Kudora, APRN   Stopped at 07/29/18 0800   . benzonatate (TESSALON) capsule  100 mg Oral 3x/day Maize, Kudora, APRN   100 mg at 07/29/18 0758   . dolutegravir (TIVICAY) tablet  50 mg Oral Daily Maize, Kudora, APRN   50 mg at 07/29/18 0758   . emtricitabine-tenofovir (DESCOVY) 200mg -25mg  per tablet  1 Tab Oral Daily Maize, Kudora, APRN   1 Tab at 07/29/18 0758   . enoxaparin PF (LOVENOX) 40 mg/0.4 mL SubQ injection  40 mg Subcutaneous Q24H Mannan, Abdul, MD   40 mg at 07/28/18 2043   . heparin flush (HEPLOCK) 100 units/mL injection  5 mL Intracatheter Daily PRN Maize, Kudora, APRN       . metoprolol tartrate (LOPRESSOR) tablet  25 mg Oral 2x/day Maize, Kudora, APRN   25 mg at 07/29/18 0758   . NS flush syringe  10-20 mL Intravenous Q8HRS Maize, Kudora, APRN   Stopped at 07/29/18 0600   . NS flush syringe  20-30 mL Intravenous Q1 MIN PRN Maize, Kudora, APRN       . ondansetron (ZOFRAN) 2 mg/mL injection  4 mg Intravenous Q8H PRN Maize, Kudora, APRN       . oxyCODONE (ROXICODONE) immediate release tablet  5 mg Oral Q4H PRN Maize, Kudora, APRN   5 mg at 07/29/18 0805   . polyethylene glycol (MIRALAX) oral packet  17 g Oral Daily PRN Maize, Kudora, APRN       .  potassium chloride (K-DUR) extended release tablet  40 mEq Oral Q2H [place], MD   40 mEq at 07/29/18 0758   . sodium chloride tablet  1 g Oral 3x/day-Meals Maize, Kudora, APRN   1 g at 07/29/18 0758   . SSIP insulin R human (HUMULIN R) 100 units/mL injection  0-12 Units Subcutaneous 4x/day PRN Maize, Kudora, APRN   2 Units at 07/29/18 0629     Objective:  Temperature: 36.7 C (98.1 F)  Heart Rate: 90  BP (Non-Invasive): 113/65  Respiratory Rate: 17  SpO2: 96 %  Pain Score (Numeric, Faces): 8    Labs:  Reviewed   Ref. Range 06/13/2018 05:43 07/22/2018 10:59 07/28/2018 13:41   TSH Latest Ref Range: 0.350 - 5.000 uIU/mL 0.050 (L) 0.053 (L) 0.043 (L)   THYROXINE, FREE (FREE T4) Latest Ref Range: 0.70 - 1.25 ng/dL 0.64 (L)  0.65 (L)   FREE T3 Latest Ref Range: 1.7 - 3.7 pg/mL   1.8       Imaging Studies:  Reviewed  I have personally reviewed the images from patient's CT brain from 07/28/2018 and see a mass in the suprasellar area.    CT BRAIN W/WO IV CONTRAST performed on  07/28/2018 6:13 PM.  REASON FOR EXAM:  evaluation of metastatic disease  RADIATION DOSE: 1398.20 mGycm  CONTRAST: Optiray 320  TECHNIQUE: Axial sections from the vertex of the skull to the skull base  with coronal and sagittal reformats, with and without contrast. Bone and  brain algorithms utilized.  COMPARISON: MRI brain from 05/25/2018.  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  Allowing for technical differences, this has increased in size as compared  to the previous MRI from 05/25/2018 where it measured approximately 9 mm AP  by 7 mm transverse by 7 mm craniocaudal. The prior MRI demonstrates that  this mass incorporating portions of the optic chiasm.   No further definite intracranial space-occupying lesion. Gray-white matter  differentiation is well maintained. No parenchymal hemorrhage. No  extra-axial fluid collection or hematoma. No calvarial or  skull base  lesion. Mastoid air cells and paranasal sinuses are clear. Old right  maxillary fracture with K wire in place.   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  Given the patient's history  of primary malignancy, a metastasis is most likely.    Assessment/Recommendations:  Thomas Barry is a 53 y.o. 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 Endocrinology is consulted for evaluation of abnormal thyroid labs.     Central Hypothyroidism  -Labs in 05/2018 noted low TSH 0.05 with low free T4 0.64  -Repeat labs 07/28/18 with low TSH 0.043, low free T4 0.65, low-normal free T3 1.8  -CT brain showing likely metastatic disease in suprasellar cistern making central hypothyroidism more likely than sick euthyroid  -8 AM cortisol in process; will follow results. If cortisol is low, recommend starting physiologic doses of hydrocortisone 15 mg qAM and 5 mg qPM as patient is currently hemodynamically stable  -After received first dose hydrocortisone, recommend starting PO Synthroid 25 mcg qAM and repeat free T4 tomorrow AM so can see if need to adjust the dose  -If patient becomes hemodynamically unstable in any way, recommend starting stress dose IV hydrocortisone 50 mg q6h  -Recommend holding off on starting Synthroid until adrenal insufficiency ruled out as starting thyroid medication prior to steroids in setting of adrenal insufficiency can precipitate an adrenal crisis  -If patient and family want aggressive management, recommend MRI sella for further evaluation of pituitary gland; if they would prefer conservative measures or CMO, would hold off on MRI    Thank you for this  consult. Will continue to follow. Please page with questions.    Keith Rake, MD, MPH 07/29/2018, 08:41  ---------    07/29/2018  I discussed the case with the fellow.  I reviewed the fellow's note.  I agree with the findings and plan of care as documented in the fellow's note.  Any exceptions/additions are edited/noted.    Cleatrice Burke, DO

## 2018-07-29 NOTE — ACP (Advance Care Planning) (Signed)
Does the Patient have an Advance Directive?: Yes, Patient Does Have Advance Directive for Healthcare Treatment  Type of Advance Directive Completed: Medical Power of Attorney  Copy of Advance Directives in Chart?: Yes, Copy on Chart.(Specify in [place] Directive)  Name of MPOA or Healthcare Surrogate: [name] [name]  Phone Number of MPOA or Healthcare Surrogate: [phone]  Patient Requests Assistance in Having Advance Directive Notarized.: Not Applicable
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
## 2018-07-29 NOTE — Care Plan (Signed)
MSW met with pt at bedside to complete initial assessment. Address and phone verified per pt. Pt reports he lives in a farmhouse with his parents, however he has his own section of the house that has one step to enter. Pt reports his parents will provide transportation upon d/c. Pt is insured via [employer] and states he has prescription coverage. Pt states he uses a daily pill box to assist in medication management and uses [facility] in [place]. Pt states his PCP is Dr. Trixie Rude (pt unsure of spelling) at [facility] in [place], reports he last saw PCP on Monday. Pt is not active with HH, uses no DME in the home. Pt did not have MPOA on file, MSW provided pt with information, pt completed MPOA notarized by MSW [employer] and witnessed by MSW Kayleen Memos and [facility]. Pt appointed sister [name] [phone]). Copies provided to pt, copy on chart for scanning. Lay caregiver addressed by bedside nurse. MSW will continue to follow.    Vicente Serene, SOCIAL WORKER  07/29/2018, 15:34

## 2018-07-29 NOTE — Nurses Notes (Signed)
Alert and oriented X4. Pt reports pain 8/10, PRN oxycodone given. Pt is on high fall precautions, refusing all precautions including sitter select and video monitoring. Instructed pt to use call bell. Call bell is within reach. Tolerated po meds well. Assessment and vital signs as documented. No questions or concerns at this time.    Bluford Kaufmann, RN  07/29/2018, 08:08

## 2018-07-29 NOTE — Consults (Signed)
[name]    Date of Service:  07/29/2018  Requesting MD: Derryl Harbor  Reason for Consult:  Goals of care clarification  Assessment & Recommendations:     Encounter for palliative care; Adult Failure to Thrive secondary to [place]. Pain likely cancer related. [place] held. He was mainly gathering information to relay back to his family. He prefers quality over quantity of life. His decision for No CPR was based on not wanting an outcome that would lead him to be a burden to his family. We discussed his options from a hospice perspective. He would prefer home hospice should he make that decision. He is not afraid of death as its a natural process of life.  His main concern is for his family after his passing. He is living with his parents currently. We agreed to follow up in 1 week in clinic.3    Decision Making Capacity:  Yes    Advance Directives prior to consultation:  MPOA-  None on File.    Recs:  - Increase oxycodone to 7.5mg  q4h PRN, please observe for drowsiness, patient informed  - Please supply enough meds for patient to be refilled in clinic  - DNR/DNI, further limitations pending clinic visit next week  - For sleep, he will get zolpidem from PCP    Thank you for involving [place] in the care of this patient. Palliative will sign off for now. Please reconsult PRN as clinically indicated if patient is still admitted, otherwise I will see him in clinic next week.    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 He is currently admitted for R leg edema, negative for thrombus. Based on chart review,  treatment goal for [name] is palliative.    Chart review shows he stopped taking his antidepressants 2 weeks ago, as he needed these for work. He also mentioned to the primary team his wish to be DNR/DNI as his chemotherapy is not curing his cancer. Chart review also shows he opted to not speak with psychiatry.    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 Palliative on board for [name].    Patient seen sitting in chair. His pain persists, usually uses 4-5 oxycodones a day. Pain is in his abdomen, right flank. Worst pain rating close to 8/10, oxycodone relieves down 4-5/10. He would prefer a pain level of 2-3/10. Sleep varies. Appetite is good, eats small meals but frequently. Denies depression. He had headaches past few weeks, but did not seem to be too bothersome. He feels his gait has been wobbly recently. No other complaints.    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 He is close to his family. Lives with his parents currently.    Symptom Assessment:    Pain:  Numeric 8  Pain History: Location(s) Abdomen  Onset: Chronic (greater than 1 month)    Pt/Family unit dynamics: Supportive parents  Spiritual Assessment:  Spiritual    Medical orders prior to consultation: Post - POST form not discussed DNR Card - No  Code Status:  No CPR  Treatment Limitations prior to consultation:  None        Exam:  Temperature: 36.7 C (98.1 F)  Heart Rate: 90  BP (Non-Invasive): 113/65  Respiratory Rate: 17  SpO2: 96 %  Pain Score (Numeric, Faces): 8    General: moderately obese and jaundiced  Eyes: Conjunctiva clear., Pupils equal and round.   HENT:ENMT without erythema or injection, mucous membranes moist.  Neck: no thyromegaly or lymphadenopathy  Lungs: Clear to auscultation bilaterally.   Cardiovascular: regular rate and rhythm  Abdomen: tender RUQ and  epigastrium  Extremities: No cyanosis or edema  Skin: Skin warm and dry  Neurologic: No tremor, No asterixis  Lymphatics: No lymphadenopathy  Psychiatric: Normal affect, behavior, memory, thought content, judgement, and speech. Teary    Ventilator Settings: N/A  HFNC Settings:N/A  BIPAP Settings:N/A  Automatic Implantable Cardiac Defibrillator  No    Palliative Performance Scale-  80      Labs:    Lab Results Today:    Results for orders placed or performed during the hospital encounter of 07/28/18 (from the past 24 hour(s))   TYPE AND SCREEN - ONE TIME - TODAY   Result Value Ref Range    UNITS ORDERED NOT STATED         ABO/RH(D) A POSITIVE     ANTIBODY SCREEN NEGATIVE     SPECIMEN EXPIRATION DATE 67/89/3810    BASIC METABOLIC PANEL - ONCE   Result Value Ref Range    SODIUM 132 (L) 136 - 145 mmol/L    POTASSIUM 2.9 (L) 3.5 - 5.1 mmol/L    CHLORIDE 100 96 - 111 mmol/L    CO2 TOTAL 22 22 - 32 mmol/L    ANION GAP 10 4 - 13 mmol/L    CALCIUM 9.1 8.5 - 10.2 mg/dL    GLUCOSE 265 (H) 65 - 139 mg/dL    BUN 13 8 - 25 mg/dL    CREATININE 0.64 0.62 - 1.27 mg/dL    BUN/CREA RATIO 20 6 - 22    ESTIMATED GFR >60 >60 mL/min/1.47m^2   PHOSPHORUS - ONCE   Result Value Ref Range    PHOSPHORUS 1.8 (L) 2.4 - 4.7 mg/dL   MAGNESIUM - ONCE   Result Value Ref Range    MAGNESIUM 1.5 (L) 1.6 - 2.6 mg/dL   THYROID STIMULATING HORMONE (SENSITIVE TSH)   Result Value Ref Range    TSH 0.043 (L) 0.350 - 5.000 uIU/mL   THYROXINE, FREE (FREE T4)   Result Value Ref Range    THYROXINE (T4), FREE 0.65 (L) 0.70 - 1.25 ng/dL   T3 (TRIIODOTHYRONINE), FREE, SERUM   Result Value Ref Range    T3 FREE 1.8 1.7 - 3.7 pg/mL   POC BLOOD GLUCOSE (RESULTS)   Result Value Ref Range    GLUCOSE, POC 306 (H) 70 - 105 mg/dl   B-TYPE NATRIURETIC PEPTIDE   Result Value Ref Range    BNP 68 <=100 pg/mL   ECG 12-LEAD   Result Value Ref Range    Ventricular rate 93 BPM    Atrial Rate 93 BPM    PR Interval 148 ms    QRS Duration 114 ms    QT  Interval 372 ms    QTC Calculation  462 ms    Calculated P Axis 33 degrees    Calculated R Axis 50 degrees    Calculated T Axis 92 degrees   POC BLOOD GLUCOSE (RESULTS)   Result Value Ref Range    GLUCOSE, POC 270 (H) 70 - 105 mg/dl   BASIC METABOLIC PANEL   Result Value Ref Range    SODIUM 133 (L) 136 - 145 mmol/L    POTASSIUM 3.2 (L) 3.5 - 5.1 mmol/L    CHLORIDE 101 96 - 111 mmol/L    CO2 TOTAL 24 22 - 32 mmol/L    ANION GAP 8 4 - 13 mmol/L    CALCIUM 8.6 8.5 - 10.2 mg/dL    GLUCOSE 187 (H) 65 - 139 mg/dL    BUN 11 8 - 25 mg/dL    CREATININE 0.66 0.62 - 1.27 mg/dL    BUN/CREA RATIO 17 6 - 22    ESTIMATED GFR >60 >60 mL/min/1.5m^2   MAGNESIUM   Result Value Ref Range    MAGNESIUM 1.9 1.6 - 2.6 mg/dL   HEPATIC FUNCTION PANEL   Result Value Ref Range    ALBUMIN 2.2 (L) 3.5 - 5.0 g/dL    ALKALINE PHOSPHATASE 775 (H) 45 - 115 U/L    ALT (SGPT) 167 (H) <55 U/L    AST (SGOT) 118 (H) 8 - 48 U/L    BILIRUBIN TOTAL 9.4 (H) 0.3 - 1.3 mg/dL    BILIRUBIN DIRECT 7.1 (H) <0.3 mg/dL    PROTEIN TOTAL 5.8 (L) 6.4 - 8.3 g/dL   CBC WITH DIFF   Result Value Ref Range    WBC 6.8 3.7 - 11.0 x10^3/uL    RBC 3.52 (L) 4.50 - 6.10 x10^6/uL    HGB 10.6 (L) 13.4 - 17.5 g/dL    HCT 32.6 (L) 38.9 - 52.0 %    MCV 92.6 78.0 - 100.0 fL    MCH 30.1 26.0 - 32.0 pg    MCHC 32.5 31.0 - 35.5 g/dL    RDW-CV 20.6 (H) 11.5 - 15.5 %    PLATELETS 323 150 - 400 x10^3/uL    MPV 10.0 8.7 - 12.5 fL    NEUTROPHIL % 56 %    LYMPHOCYTE % 25 %    MONOCYTE % 15 %    EOSINOPHIL % 1 %    BASOPHIL % 1 %    NEUTROPHIL # 3.92 1.50 - 7.70 x10^3/uL    LYMPHOCYTE # 1.67 1.00 - 4.80 x10^3/uL    MONOCYTE # 0.99 0.20 - 1.10 x10^3/uL    EOSINOPHIL # <0.10 <=0.50 x10^3/uL    BASOPHIL # <0.10 <=0.20 x10^3/uL    IMMATURE GRANULOCYTE % 2 (H) 0 - 1 %    IMMATURE GRANULOCYTE # 0.10 (H) <0.10 x10^3/uL   POC BLOOD GLUCOSE (RESULTS)   Result Value Ref Range    GLUCOSE, POC 173 (H) 70 - 105 mg/dl   CORTISOL, PLASMA OR SERUM   Result Value Ref Range    CORTISOL 16.6 ug/dL       Imaging Studies:  Images and Reports reviewed  to current date.    EKG:    Narrative & Impression     Normal sinus rhythm  ST & T wave abnormality, consider anterolateral ischemia  Left atrial enlargement  Abnormal ECG  When compared with ECG of 12-Jun-2018 18:23,  Nonspecific T wave abnormality now evident in Inferior leads  Confirmed by ZEB MD, IRFAN ([id number]) on 07/28/2018 3:50:58  PM   Result Data     Result Status Result Component Value Units   Final result [3] Ventricular rate 93 BPM    Atrial Rate 93 BPM    PR Interval 148 ms    QRS Duration 114 ms    QT Interval 372 ms    QTC Calculation 462 ms    Calculated P Axis 33 degrees    Calculated R Axis 50 degrees    Calculated T Axis 92 degrees         Thomas Barry  Male, 53 years old.    CT BRAIN W/WO IV CONTRAST performed on 07/28/2018 6:13 PM.    REASON FOR EXAM:  evaluation of metastatic disease    RADIATION DOSE: 1398.20 mGycm    CONTRAST: Optiray 320    TECHNIQUE: Axial sections from the vertex of the skull to the skull base  with coronal and sagittal reformats, with and without contrast. Bone and  brain algorithms utilized.    COMPARISON: MRI brain from 05/25/2018.    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  Allowing for technical differences, this has increased in size as compared  to the previous MRI from 05/25/2018 where it measured approximately 9 mm AP  by 7 mm transverse by 7 mm craniocaudal. The prior MRI demonstrates that  this mass incorporating portions of the optic chiasm.     No further definite intracranial space-occupying lesion. Gray-white matter  differentiation is well maintained. No parenchymal hemorrhage. No  extra-axial fluid collection or hematoma. No calvarial or skull base  lesion. Mastoid air cells and paranasal sinuses are clear. Old right  maxillary fracture with K wire in place.     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  Given the patient's history  of primary malignancy, a metastasis is most likely    Consultant:  Pennie Banter, MD    Total Time of Encounter:  60 minutes Including face to face consultation with patient/family  Time in Counseling as above:  >50% of the total time above spent in counseling, providing support and care coordination as detailed above.  Start time:  1200  Stop Time:  [place], MD

## 2018-07-29 NOTE — Consults (Signed)
[facility]  Psychiatry Consults      Thomas Barry  12-03-1965  [id number]    Date of service: 07/29/18    CHIEF COMPLAINT/REASON FOR CONSULT: [place] and Safety Concerns     ASSESSMENT:  Thomas Barry is a 53 y.o. 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 Psych consulted for [facility] eval and SI concerns after patient stopped taking psych meds (Wellbutrin and Zoloft) x2 weeks ago as reportedly was only on them to help manage "work stress," which is no longer an issue. Based on assessment, patient has [facility] though need 2 licensed providers to make that decision. Patient understands severity of diagnosis/prognosis though did express anger/frustration about not knowing specific details regarding lab/imaging results. Has declined psychiatric services at this time include psychotropic meds/therapy as would prefer to speak with Supportive Care to manage pain/discuss palliative care options. Informed patient/primary team to notify psychiatry should patient wish to speak further with psychiatry/interested in psych services. Denies any SI. No acute safety concerns from psychiatric standpoint.       Safety Screening: Unable to assess   A safety assessment was completed. Risk factors include: physical symptom burden, pain and cancer diagnosis. At this time, the patient does not pose an acute risk above baseline. Protective factors include: no thoughts of suicide, no thoughts of self-harm and no thoughts of harm toward others.    [facility] - Patient was able to understand his diagnosis, prognosis, able to spell out his treatment, benefits vs risks. He was able to communicate well, appreciate, understand and rationalize his thought process.     Irritability/mood issues - he is noted to be struggling with accepting diagnosis, prognosis, angry/frustrated with treatment outcome,  express wishes to get treatment to extend his life span. He states he does not want to consider any medications and did not want to repeat his thought process. He was more interested in receiving chemotherapy and was questioning why he did not receive it yesterday as planned. He wanted to learn the results of CT scan, EKG and USG. He showed more interest in his health and outcome; unable to process during session, seems to avoid and has conflicted emotions, hide behind anger to avoid processing his current situation.    PSYCHIATRIC DIAGNOSES: Unspecified anxiety/depressive disorder  Medical Comorbidities: Small Cell Lung Cancer s/p palliative radiation, HIV, Hep B, DM2      PLAN:  - Please place psychiatry consult order if not already done.  - Precautions: No indication for 1:1 supervision.   - Decision Making Capacity   -Patient has [place] based on assessment   - Medications/Thearpy     -Pt does not wish to restart psychiatric medications   -Recommend optimizing pain management with Supportive Care    -Pt does not wish to meet with inpatient therapist   - Disposition: No psychiatric hospitalization indicated.    -Pt does not wish to meet with psychiatrist outpatient   -Informed to notify primary team should he change his mind       SUBJECTIVE: Pt very short in conversation. Refused to speak with this interviewer after introducing as part of the psychiatry team. Reports that he already told primary team he does not wish to speak with psych. Reports that he canceled last few outpatient appts already. Asked if patient was interested in discussing medication/treatment options that could be provided through  psychiatric services, also declined. Stated he did not wish to continue conversation further.     During later interview, patient was somewhat more open to speaking. Admitted anger over current medical condition. Expressed frustration over not knowing specifics regarding his diagnoses, specifically results of  tests/imaging. States that he does want chemotherapy, but declined any psychiatric services. Does understand what his diagnosis is and what would happen should he undergo therapy or not. Would like to speak with Supportive Care though to discuss further. Admits to feeling depressed over prognosis but does not wish to taking psychiatric medications. Would rather discuss medication options to better manage pain w/ Supportive Care. Declined inpatient therapy as well. Informed patient to let primary team know if he has any change in mind and team could speak with him further. Denies any SI. States that he wants to live, though recognizes severity of current medical condition.       ROS: + abdominal and BLE pain       Current Medications: Reviewed in Epic.  Allergies:   Allergies   Allergen Reactions   . Sulfa (Sulfonamides) Rash and Itching     Past Psychiatric History:  Outpatient: previously followed with outpatient psychiatrist, canceled last x2 appointments   Medications: Zoloft and Wellbutrin, stopped taking approx 2 weeks ago     Substance Use    Per chart review former smoker 1.5 PPD x years   Alcohol: Occasional     PHYSICAL EXAM:  Constitutional: BP 113/65   Pulse 90   Temp 36.7 C (98.1 F)   Resp 17   Ht 1.753 m (5' 9.02")   Wt 124 kg (274 lb 0.5 oz)   SpO2 96%   BMI 40.45 kg/m       Eyes: Pupils equal, round. EOM grossly intact. No nystagmus. Conjunctiva clear.  Respiratory: Regular rate. No increased work of breathing. No use of accessory muscles.  Cardiovascular: No swelling/edema of exposed extremities.  Musculoskeletal: Gait/station as below. + BLE pitting edema.   Heme/Lymph: Neck supple without adenopathy.  Neuro: Alert, oriented to person, place, time, situation. No abnormal movements noted. No tremor.  Psych: Below.  Skin: Dry. No diaphoresis or flushing. No noticeable erythema, abrasions, or lesions on exposed skin.    Mental Status Exam:  Appearance: obese, appears older than stated age,  chronically ill appearing   Behavior: uncooperative and good eye contact, refuses to speak with this interviewer   Gait/Station: sitting upright in bed  Musculoskeletal: No psychomotor agitation or retardation noted  Speech: regular rate and regular volume, short   Mood: Unable to assess due to unwillingness to participate in   Affect: dysthymic and constricted  Thought Process: linear  Associations:  no loosening of associations  Thought Content: In listening to conversation with nurse, focused on treatment for pain   Perceptual Disturbances: Unable to assess   Attention/Concentration: grossly intact  Orientation: grossly oriented  Memory: Unable to assess   Language: no word-finding issues  Insight: Unable to assess   Judgment: Unable to assess         PERTINENT STUDIES/TESTING:  UDS: Not obtained   EtOH: Not obtained     Grant Rochester, MD  07/29/2018, 08:49  PGY-1 Behavioral Medicine & Psychiatry    I saw and examined the patient. I was present and participated in the development of the treatment plan.   I reviewed the resident's note. I agree with the findings and plan of care as documented in the resident's note.  Any exceptions/ additions are edited/noted.        Sherlyn Lick, MD  07/29/2018, 14:19  Staff (Child & Adolescent) Psychiatrist  Dept of [facility]

## 2018-07-30 ENCOUNTER — Encounter (HOSPITAL_COMMUNITY): Payer: Self-pay

## 2018-07-30 ENCOUNTER — Telehealth (HOSPITAL_COMMUNITY): Payer: Self-pay

## 2018-07-30 ENCOUNTER — Inpatient Hospital Stay (HOSPITAL_BASED_OUTPATIENT_CLINIC_OR_DEPARTMENT_OTHER): Payer: Commercial Managed Care - PPO

## 2018-07-30 NOTE — Care Management Notes (Signed)
07/30/18 1319   Date and Time of Coordination Contact   Date 07/30/18   Time 1317   Patient Contact   Contact made via Phone   Spoke with Patient   Call Outcome Call deferred   Call deferred reason Other (see comments)  (Pt had poor cell service. Referred to MyChart and instructed to call back in case of assistance or with questions.  Pt receptive and appreciative.)   Additional Interventions   Total # Outreaches 2   Approx. Time Spent (min.) 0-5   [facility], LPN  11/20/1912, 78:29

## 2018-08-03 ENCOUNTER — Ambulatory Visit (HOSPITAL_BASED_OUTPATIENT_CLINIC_OR_DEPARTMENT_OTHER): Payer: Self-pay

## 2018-08-03 ENCOUNTER — Ambulatory Visit
Admission: RE | Admit: 2018-08-03 | Discharge: 2018-08-03 | Disposition: A | Discharge status: Home / Self Care | Payer: Commercial Managed Care - PPO | Source: Ambulatory Visit | Attending: Physician Assistant | Admitting: Physician Assistant

## 2018-08-03 DIAGNOSIS — C3490 Malignant neoplasm of unspecified part of unspecified bronchus or lung: Secondary | ICD-10-CM

## 2018-08-03 DIAGNOSIS — G939 Disorder of brain, unspecified: Secondary | ICD-10-CM

## 2018-08-03 DIAGNOSIS — R590 Localized enlarged lymph nodes: Secondary | ICD-10-CM

## 2018-08-03 DIAGNOSIS — C787 Secondary malignant neoplasm of liver and intrahepatic bile duct: Secondary | ICD-10-CM

## 2018-08-03 DIAGNOSIS — R918 Other nonspecific abnormal finding of lung field: Secondary | ICD-10-CM

## 2018-08-03 DIAGNOSIS — C7951 Secondary malignant neoplasm of bone: Secondary | ICD-10-CM

## 2018-08-03 LAB — POC BLOOD GLUCOSE (RESULTS): GLUCOSE, POC: 150 mg/dL — ABNORMAL HIGH (ref 70–105)

## 2018-08-03 MED ORDER — IOPAMIDOL 300 MG IODINE/ML (61 %) INTRAVENOUS SOLUTION
100.00 mL | INTRAVENOUS | Status: AC
Start: 2018-08-03 — End: 2018-08-03
  Administered 2018-08-03: 12:00:00 100 mL via INTRAVENOUS

## 2018-08-03 MED ORDER — DIATRIZOATE MEGLUMINE-DIATRIZOATE SODIUM 66 %-10 % ORAL SOLUTION
10.00 mL | ORAL | Status: AC
Start: 2018-08-03 — End: 2018-08-03
  Administered 2018-08-03: 11:00:00 10 mL via ORAL

## 2018-08-03 NOTE — Telephone Encounter (Signed)
Routing to provider to advise. Melynda Ripple, RN  08/03/2018, 14:06

## 2018-08-04 ENCOUNTER — Other Ambulatory Visit (HOSPITAL_COMMUNITY): Payer: Self-pay | Admitting: Radiation Oncology

## 2018-08-04 ENCOUNTER — Other Ambulatory Visit (HOSPITAL_BASED_OUTPATIENT_CLINIC_OR_DEPARTMENT_OTHER): Payer: Self-pay | Admitting: General Practice

## 2018-08-04 DIAGNOSIS — C7931 Secondary malignant neoplasm of brain: Secondary | ICD-10-CM

## 2018-08-04 DIAGNOSIS — C7949 Secondary malignant neoplasm of other parts of nervous system: Secondary | ICD-10-CM

## 2018-08-06 ENCOUNTER — Inpatient Hospital Stay (HOSPITAL_COMMUNITY)
Admission: RE | Admit: 2018-08-06 | Discharge: 2018-08-06 | Disposition: A | Discharge status: Still In Hospital | Payer: Commercial Managed Care - PPO | Source: Ambulatory Visit | Attending: Radiation Oncology | Admitting: Radiation Oncology

## 2018-08-06 ENCOUNTER — Inpatient Hospital Stay (HOSPITAL_BASED_OUTPATIENT_CLINIC_OR_DEPARTMENT_OTHER)
Admission: RE | Admit: 2018-08-06 | Discharge: 2018-08-06 | Disposition: A | Discharge status: Still In Hospital | Payer: Commercial Managed Care - PPO | Source: Ambulatory Visit | Attending: Radiation Oncology | Admitting: Radiation Oncology

## 2018-08-06 ENCOUNTER — Ambulatory Visit (HOSPITAL_BASED_OUTPATIENT_CLINIC_OR_DEPARTMENT_OTHER): Payer: Commercial Managed Care - PPO | Admitting: Geriatric Medicine

## 2018-08-06 ENCOUNTER — Ambulatory Visit (HOSPITAL_BASED_OUTPATIENT_CLINIC_OR_DEPARTMENT_OTHER): Payer: Commercial Managed Care - PPO

## 2018-08-06 ENCOUNTER — Inpatient Hospital Stay (HOSPITAL_BASED_OUTPATIENT_CLINIC_OR_DEPARTMENT_OTHER): Payer: Commercial Managed Care - PPO | Admitting: General Practice

## 2018-08-06 ENCOUNTER — Other Ambulatory Visit: Payer: Self-pay

## 2018-08-06 ENCOUNTER — Encounter (HOSPITAL_BASED_OUTPATIENT_CLINIC_OR_DEPARTMENT_OTHER): Payer: Self-pay

## 2018-08-06 ENCOUNTER — Encounter (HOSPITAL_BASED_OUTPATIENT_CLINIC_OR_DEPARTMENT_OTHER): Payer: Self-pay | Admitting: General Practice

## 2018-08-06 ENCOUNTER — Encounter (HOSPITAL_BASED_OUTPATIENT_CLINIC_OR_DEPARTMENT_OTHER): Payer: Self-pay | Admitting: Geriatric Medicine

## 2018-08-06 ENCOUNTER — Inpatient Hospital Stay (HOSPITAL_BASED_OUTPATIENT_CLINIC_OR_DEPARTMENT_OTHER)
Admission: RE | Admit: 2018-08-06 | Discharge: 2018-08-06 | Disposition: A | Discharge status: Still In Hospital | Payer: Commercial Managed Care - PPO | Source: Ambulatory Visit | Attending: General Practice | Admitting: General Practice

## 2018-08-06 ENCOUNTER — Inpatient Hospital Stay (HOSPITAL_BASED_OUTPATIENT_CLINIC_OR_DEPARTMENT_OTHER): Payer: Commercial Managed Care - PPO

## 2018-08-06 VITALS — BP 123/64 | HR 105 | Temp 98.2°F | Resp 19 | Ht 69.0 in | Wt 282.2 lb

## 2018-08-06 DIAGNOSIS — R627 Adult failure to thrive: Secondary | ICD-10-CM | POA: Insufficient documentation

## 2018-08-06 DIAGNOSIS — C781 Secondary malignant neoplasm of mediastinum: Secondary | ICD-10-CM | POA: Insufficient documentation

## 2018-08-06 DIAGNOSIS — C7951 Secondary malignant neoplasm of bone: Secondary | ICD-10-CM | POA: Insufficient documentation

## 2018-08-06 DIAGNOSIS — E119 Type 2 diabetes mellitus without complications: Secondary | ICD-10-CM

## 2018-08-06 DIAGNOSIS — R197 Diarrhea, unspecified: Secondary | ICD-10-CM | POA: Insufficient documentation

## 2018-08-06 DIAGNOSIS — Z510 Encounter for antineoplastic radiation therapy: Secondary | ICD-10-CM | POA: Insufficient documentation

## 2018-08-06 DIAGNOSIS — C3490 Malignant neoplasm of unspecified part of unspecified bronchus or lung: Secondary | ICD-10-CM

## 2018-08-06 DIAGNOSIS — Z515 Encounter for palliative care: Secondary | ICD-10-CM | POA: Insufficient documentation

## 2018-08-06 DIAGNOSIS — C787 Secondary malignant neoplasm of liver and intrahepatic bile duct: Secondary | ICD-10-CM | POA: Insufficient documentation

## 2018-08-06 DIAGNOSIS — Z6841 Body Mass Index (BMI) 40.0 and over, adult: Secondary | ICD-10-CM | POA: Insufficient documentation

## 2018-08-06 DIAGNOSIS — R17 Unspecified jaundice: Secondary | ICD-10-CM

## 2018-08-06 DIAGNOSIS — M79604 Pain in right leg: Secondary | ICD-10-CM | POA: Insufficient documentation

## 2018-08-06 DIAGNOSIS — C7949 Secondary malignant neoplasm of other parts of nervous system: Principal | ICD-10-CM

## 2018-08-06 DIAGNOSIS — R945 Abnormal results of liver function studies: Secondary | ICD-10-CM | POA: Insufficient documentation

## 2018-08-06 DIAGNOSIS — B20 Human immunodeficiency virus [HIV] disease: Secondary | ICD-10-CM | POA: Insufficient documentation

## 2018-08-06 DIAGNOSIS — D649 Anemia, unspecified: Secondary | ICD-10-CM | POA: Insufficient documentation

## 2018-08-06 DIAGNOSIS — C778 Secondary and unspecified malignant neoplasm of lymph nodes of multiple regions: Secondary | ICD-10-CM | POA: Insufficient documentation

## 2018-08-06 DIAGNOSIS — Z66 Do not resuscitate: Secondary | ICD-10-CM | POA: Insufficient documentation

## 2018-08-06 DIAGNOSIS — Z951 Presence of aortocoronary bypass graft: Secondary | ICD-10-CM | POA: Insufficient documentation

## 2018-08-06 DIAGNOSIS — R5383 Other fatigue: Secondary | ICD-10-CM | POA: Insufficient documentation

## 2018-08-06 DIAGNOSIS — Z79890 Hormone replacement therapy (postmenopausal): Secondary | ICD-10-CM | POA: Insufficient documentation

## 2018-08-06 DIAGNOSIS — C7931 Secondary malignant neoplasm of brain: Secondary | ICD-10-CM | POA: Insufficient documentation

## 2018-08-06 DIAGNOSIS — R52 Pain, unspecified: Secondary | ICD-10-CM | POA: Insufficient documentation

## 2018-08-06 DIAGNOSIS — I2510 Atherosclerotic heart disease of native coronary artery without angina pectoris: Secondary | ICD-10-CM | POA: Insufficient documentation

## 2018-08-06 DIAGNOSIS — Z7982 Long term (current) use of aspirin: Secondary | ICD-10-CM | POA: Insufficient documentation

## 2018-08-06 DIAGNOSIS — H159 Unspecified disorder of sclera: Secondary | ICD-10-CM | POA: Insufficient documentation

## 2018-08-06 DIAGNOSIS — I10 Essential (primary) hypertension: Secondary | ICD-10-CM | POA: Insufficient documentation

## 2018-08-06 DIAGNOSIS — H02409 Unspecified ptosis of unspecified eyelid: Secondary | ICD-10-CM | POA: Insufficient documentation

## 2018-08-06 DIAGNOSIS — Z79899 Other long term (current) drug therapy: Secondary | ICD-10-CM | POA: Insufficient documentation

## 2018-08-06 DIAGNOSIS — Z7951 Long term (current) use of inhaled steroids: Secondary | ICD-10-CM | POA: Insufficient documentation

## 2018-08-06 LAB — CBC WITH DIFF
BASOPHIL #: <0.1 x10ˆ3/uL (ref <=0.20)
BASOPHIL %: 0 %
BASOPHIL %: 0 %
EOSINOPHIL #: <0.1 x10ˆ3/uL (ref <=0.50)
EOSINOPHIL %: 0 %
HCT: 33.1 % — ABNORMAL LOW (ref 38.9–52.0)
HGB: 10.4 g/dL — ABNORMAL LOW (ref 13.4–17.5)
IMMATURE GRANULOCYTE #: 0.32 10*3/uL — ABNORMAL HIGH (ref <0.10)
IMMATURE GRANULOCYTE %: 2 % — ABNORMAL HIGH (ref 0–1)
LYMPHOCYTE #: 1.76 10*3/uL (ref 1.00–4.80)
LYMPHOCYTE %: 9 %
MCH: 31 pg (ref 26.0–32.0)
MCHC: 31.4 g/dL (ref 31.0–35.5)
MCV: 98.5 fL (ref 78.0–100.0)
MONOCYTE #: 0.71 10*3/uL (ref 0.20–1.10)
MONOCYTE %: 4 %
MPV: 10.2 fL (ref 8.7–12.5)
NEUTROPHIL #: 17.29 x10ˆ3/uL — ABNORMAL HIGH (ref 1.50–7.70)
NEUTROPHIL %: 85 %
PLATELETS: 225 x10ˆ3/uL (ref 150–400)
RBC: 3.36 x10ˆ6/uL — ABNORMAL LOW (ref 4.50–6.10)
RDW-CV: 17.4 % — ABNORMAL HIGH (ref 11.5–15.5)
WBC: 20.2 10*3/uL — ABNORMAL HIGH (ref 3.7–11.0)

## 2018-08-06 LAB — ALK PHOS (ALKALINE PHOSPHATASE): ALKALINE PHOSPHATASE: 944 U/L — ABNORMAL HIGH (ref 45–115)

## 2018-08-06 LAB — PLATELETS AND ANC CANCER CENTER
NEUTROPHILS #: 17.29 x10ˆ3/uL — ABNORMAL HIGH (ref 1.50–7.70)
PLATELET COUNT: 225 x10ˆ3/uL (ref 150–400)

## 2018-08-06 LAB — CREATININE WITH EGFR
CREATININE: 0.77 mg/dL (ref 0.62–1.27)
ESTIMATED GFR: >60 mL/min/1.73mˆ2 (ref >60)

## 2018-08-06 LAB — ALT (SGPT): ALT (SGPT): 152 U/L — ABNORMAL HIGH (ref <55)

## 2018-08-06 LAB — BILIRUBIN TOTAL: BILIRUBIN TOTAL: 18.2 mg/dL — ABNORMAL HIGH (ref 0.3–1.3)

## 2018-08-06 LAB — CALCIUM: CALCIUM: 9.1 mg/dL (ref 8.5–10.2)

## 2018-08-06 LAB — SODIUM
SODIUM: 130 mmol/L — ABNORMAL LOW (ref 136–145)
SODIUM: 130 mmol/L — ABNORMAL LOW (ref 136–145)

## 2018-08-06 LAB — LDH: LDH: 760 U/L — ABNORMAL HIGH (ref 125–220)

## 2018-08-06 LAB — PHOSPHORUS: PHOSPHORUS: 2 mg/dL — ABNORMAL LOW (ref 2.4–4.7)

## 2018-08-06 LAB — MAGNESIUM: MAGNESIUM: 1.7 mg/dL (ref 1.6–2.6)

## 2018-08-06 LAB — POTASSIUM: POTASSIUM: 2.9 mmol/L — ABNORMAL LOW (ref 3.5–5.1)

## 2018-08-06 LAB — AST (SGOT): AST (SGOT): 190 U/L — ABNORMAL HIGH (ref 8–48)

## 2018-08-06 MED ORDER — AMOXICILLIN 875 MG-POTASSIUM CLAVULANATE 125 MG TABLET
1.00 | ORAL_TABLET | Freq: Two times a day (BID) | ORAL | 0 refills | Status: AC
Start: 2018-08-06 — End: ?

## 2018-08-06 MED ORDER — DEXAMETHASONE 4 MG TABLET
4.00 mg | ORAL_TABLET | Freq: Two times a day (BID) | ORAL | 0 refills | Status: AC
Start: 2018-08-06 — End: 2018-09-05

## 2018-08-06 MED ORDER — DEXAMETHASONE 2 MG TABLET
ORAL_TABLET | ORAL | 0 refills | Status: DC
Start: 2018-08-06 — End: 2018-08-06

## 2018-08-06 MED ORDER — OXYCODONE 15 MG TABLET
15.00 mg | ORAL_TABLET | ORAL | 0 refills | Status: AC | PRN
Start: 2018-08-06 — End: 2018-08-20

## 2018-08-06 NOTE — Cancer Center Note (Signed)
[facility]       CANCER CENTER NOTE     Date: 08/06/2018  Name: Thomas Barry  MRN: [id number]  Referring Physician: Philemon Kingdom, MD  Primary Care Provider: Janalee Dane, DO    REASON FOR VISIT:53 y.o.male from [place] [id number] for evaluation and management of extensive stage small cell lung cancer.    HISTORY OF PRESENT ILLNESS:   - Early Feb 2020. Patient developed increased dyspnea on exertion. He presented to outside facility in [place] where he was living at the time. He was found to have extensive disease in the chest with metastatic disease to the liver and bones. He was started on palliative radiation to the chest prior to pathology being completed.   - Jun 01, 2018. Liver biopsy completed. Pathology showed small cell carcinoma.   - Mid-Feb 2020. Completed palliative radiation to the chest.   - Feb 21-26, 2020. Initially presented to [facility] to establish care. Admitted to [facility] to start palliative chemotherapy with carboplatin AUC 5 IV on day 1 and etoposide 100 mg/m2 IV on days 1, 2, and 3 of 21 day cycle. Staging PET CT was declined by patient's insurance. MRI MRCP showed extensive metastatic disease throughout the entirety of the liver. Patient received chemotherapy and was discharged when stable.   - Apr 7-8, 2020. Admitted to [facility] for severe right lower extremity edema and increased bilirubin. Found to be related to metastatic disease. Patient declined to receive chemotherapy while in-patient (due for cycle 3 of carboplatin).  - Aug 03, 2018. PET CT showed extensive metastatic disease in the liver. Hypermetabolic activity in the skeletal system and diffuse lymphadenopathy. Hypermetabolic suprasellar lesion.     Thomas Barry is 53 y.o. male here today for follow-up.   Patient is not feeling very well. Right leg is much more painful and swollen. Right eye is swollen shut. Skin color has become more yellow.     REVIEW OF SYSTEMS:  General: (+) pain. (-) fevers  (-) chills. (-) weight loss. (+) fatigue.  Lymphatic: (-) palpable masses. (-) night sweats.  Heme: (-) easy bruising (-) bleeding.  (-) recurrent infections.   HEENT. (-) vision changes (-) hearing changes. (-) dysphagia. (-) sore throat.   Heart: (-) chest pain. (-) palpitation. (-) orthopnea. (-) LE edema.   Lungs: (-) dyspnea (on exertion) (-) hemoptysis. (-) cough.   Abdomen: (-) poor appetite. (-) abdominal pain. (-) nausea (-) vomiting. (-) diarrhea. (-) constipation.   GU: (-) dysuria (-) Urgency. (-) Hematuria.   MS. (-) joint pain (-) ext swelling. (-) Back pain.    Dermatologic: (-) rashes. (-) pruritus.   Psychiatric: (-) Depression. (-) anxiety. (-) insomnia.   Neurologic: (-) headaches. (-) neuropathy. (-) weakness. (-) memory problems.  Other review of systems negative.     PAST MEDICAL HISTORY:  Past Medical History:   Diagnosis Date   . Depression 07/28/2018   . Diabetes (CMS [place])    . DM (diabetes mellitus) (CMS [place]) 06/12/2018   . Hepatitis B 07/28/2018   . HIV (human immunodeficiency virus infection) (CMS [place]) 06/12/2018   . HIV positive (CMS [place]) 06/13/2018   . HTN (hypertension)    . 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 Hypokalemia 07/28/2018   . Hypomagnesemia 07/28/2018   . Hyponatremia 07/28/2018   . Hypophosphatemia 07/28/2018   . Leg edema, right 07/28/2018   . Morbid obesity 06/13/2018   . Secondary hypothyroidism  07/28/2018   . Small cell lung cancer (CMS [facility])      MEDICATIONS:  Current Outpatient Medications   Medication Sig   . aspirin (ECOTRIN) 81 mg Oral Tablet, Delayed Release (E.C.) Take 81 mg by mouth Once a day   . benzonatate (TESSALON) 100 mg Oral Capsule Take 100 mg by mouth Every 4 hours as needed for Cough    . buPROPion (WELLBUTRIN XL) 300 mg extended release 24 hr tablet Take 300 mg by mouth Once a day   . dexAMETHasone (DECADRON) 4 mg Oral Tablet Take 1 Tab (4 mg total) by mouth Twice daily for 30 days Take both doses before 3PM   . dolutegravir (TIVICAY) tablet Take 50 mg by mouth Once a day   .  empagliflozin (JARDIANCE) 25 mg Oral Tablet Take 1 Tab (25 mg total) by mouth Once a day   . emtricitabine-tenofovir alafen (DESCOVY) 200-25 mg Oral Tablet Take 1 Tab by mouth Once a day   . furosemide (LASIX) 20 mg Oral Tablet Take 1 Tab (20 mg total) by mouth Once per day as needed   . levothyroxine (SYNTHROID) 25 mcg Oral Tablet Take 1 Tab (25 mcg total) by mouth Every morning for 30 days   . metoprolol tartrate (LOPRESSOR) 25 mg Oral Tablet Take 1 Tab (25 mg total) by mouth Twice daily   . ondansetron (ZOFRAN) 4 mg Oral Tablet Take 4 mg by mouth Every 8 hours as needed for Nausea/Vomiting   . oxyCODONE (ROXICODONE) 15 mg Oral Tablet Take 1 Tab (15 mg total) by mouth Every 2 hours as needed for Pain for up to 14 days   . polyethylene glycol 3350 (MIRALAX ORAL) Take 1 Packet by mouth Once per day as needed    . promethazine (PHENERGAN) 25 mg Oral Tablet Take 25 mg by mouth Every 8 hours as needed for Nausea/Vomiting   . sertraline (ZOLOFT) 100 mg Oral Tablet Take 100 mg by mouth Once a day   . sodium chloride 1 gram Oral Tablet Take 1 g by mouth Three times daily with meals 2 tabs   . spironolactone (ALDACTONE) 25 mg Oral Tablet Take 1 Tab (25 mg total) by mouth Twice daily with food for 30 days   . triamcinolone acetonide (ARISTOCORT A) 0.1 % Ointment by Apply Topically route Twice per day as needed (Patient not taking: Reported on 07/28/2018)     ORAL CHEMO ASSESSMENT:  Oral Chemotherapy Assessment  Currently prescribed any oral chemotherapy medication(s)?: No (08/06/18 1100)    ALLERGIES:  Allergies   Allergen Reactions   . Sulfa (Sulfonamides) Rash and Itching     PHYSICAL EXAMINATION:  Vitals:  Temperature  36.8 C (98.2 F) filed at... 08/06/2018 1100   Heart Rate  (!) 105 filed at... 08/06/2018 1100   Respiratory Rate  19 filed at... 08/06/2018 1100   BP (Non-Invasive)  123/64 filed at... 08/06/2018 1100   SpO2  --   Height  1.753 m (5' 9") filed at... 08/06/2018 1100   Weight  128 kg (282 lb 3 oz) filed  at... 08/06/2018 1100   BMI (Calculated)  41.76 filed at... 08/06/2018 1100   BSA (Calculated)  2.5 filed at... 08/06/2018 1100   ECOG 2-3.   General General: Appearance has declined.   Eyes: Conjunctiva clear. Pupils equal and round. Sclera icteric.   HENT:ENT without erythema or injection, mucous membranes moist. Nose without erythema. Pharynx without injection or exudate.   Neck: No JVD or thyromegaly or lymphadenopathy and supple,  symmetrical, trachea midline  Lungs: clear to auscultation bilaterally.   Cardiovascular: Heart regular rate and rhythm. S1, S2 normal  Abdomen: soft, non-tender, bowel sounds normal.  Extremities: Severe right lower extremity edema.   Skin: Very jaundice.   Neurologic: grossly normal, gait is normal and no tremor  Lymphatics: no lymphadenopathy and cervical, supraclavicular, and axillary nodes normal.   Psychiatric: Alert and oriented x 3. Normal affect, behavior, and mood.     LABORATORY:  Results for orders placed or performed during the hospital encounter of 08/06/18 (from the past 48 hour(s))   ALK PHOS (ALKALINE PHOSPHATASE)   Result Value Ref Range    ALKALINE PHOSPHATASE 944 (H) 45 - 115 U/L   ALT (SGPT)   Result Value Ref Range    ALT (SGPT) 152 (H) <55 U/L    Narrative    Icterus can alter results at this level (moderate).   AST (SGOT)   Result Value Ref Range    AST (SGOT) 190 (H) 8 - 48 U/L    Narrative    Icterus alters results at this level (moderate). Repeat testing with new specimen suggested.   BILIRUBIN TOTAL   Result Value Ref Range    BILIRUBIN TOTAL 18.2 (H) 0.3 - 1.3 mg/dL   BLOOD CELL COUNT W/DIFF - CANCER CENTER    Narrative    The following orders were created for panel order BLOOD CELL COUNT W/DIFF - CANCER CENTER.  Procedure                               Abnormality         Status                     ---------                               -----------         ------                     CBC WITH DIFF[[id number]]                Abnormal            Final result                PLATELETS AND ANC CANCER.Marland KitchenMarland Kitchen[[id number]]  Abnormal            Final result                 Please view results for these tests on the individual orders.   SODIUM   Result Value Ref Range    SODIUM 130 (L) 136 - 145 mmol/L   PHOSPHORUS   Result Value Ref Range    PHOSPHORUS 2.0 (L) 2.4 - 4.7 mg/dL   POTASSIUM   Result Value Ref Range    POTASSIUM 2.9 (L) 3.5 - 5.1 mmol/L   MAGNESIUM   Result Value Ref Range    MAGNESIUM 1.7 1.6 - 2.6 mg/dL   LDH   Result Value Ref Range    LDH 760 (H) 125 - 220 U/L   CREATININE   Result Value Ref Range    CREATININE 0.77 0.62 - 1.27 mg/dL    ESTIMATED GFR >60 >60 mL/min/1.1m2    Narrative    Estimated Glomerular Filtration Rate (eGFR) calculated using the CKD-EPI (2009) equation,  intended for patients 64 years of age and older. If race and/or gender is not documented or "unknown," there will be no eGFR calculation.   CALCIUM   Result Value Ref Range    CALCIUM 9.1 8.5 - 10.2 mg/dL   CBC WITH DIFF   Result Value Ref Range    WBC 20.2 (H) 3.7 - 11.0 x10^3/uL    RBC 3.36 (L) 4.50 - 6.10 x10^6/uL    HGB 10.4 (L) 13.4 - 17.5 g/dL    HCT 33.1 (L) 38.9 - 52.0 %    MCV 98.5 78.0 - 100.0 fL    MCH 31.0 26.0 - 32.0 pg    MCHC 31.4 31.0 - 35.5 g/dL    RDW-CV 17.4 (H) 11.5 - 15.5 %    PLATELETS 225 150 - 400 x10^3/uL    MPV 10.2 8.7 - 12.5 fL    NEUTROPHIL % 85 %    LYMPHOCYTE % 9 %    MONOCYTE % 4 %    EOSINOPHIL % 0 %    BASOPHIL % 0 %    NEUTROPHIL # 17.29 (H) 1.50 - 7.70 x10^3/uL    LYMPHOCYTE # 1.76 1.00 - 4.80 x10^3/uL    MONOCYTE # 0.71 0.20 - 1.10 x10^3/uL    EOSINOPHIL # <0.10 <=0.50 x10^3/uL    BASOPHIL # <0.10 <=0.20 x10^3/uL    IMMATURE GRANULOCYTE % 2 (H) 0 - 1 %    IMMATURE GRANULOCYTE # 0.32 (H) <0.10 x10^3/uL   PLATELETS AND ANC CANCER CENTER   Result Value Ref Range    NEUTROPHILS # 17.29 (H) 1.50 - 7.70 x10^3/uL    PLATELET COUNT 225 150 - 400 x10^3/uL     RADIOLOGY:  We have reviewed the imaging studies and discussed the findings with the patient.  PET CT Aug 03, 2018.   BRAIN:   Significant FDG uptake associated with malignancy and pertinent CT  findings.  A suprasellar mass measuring 1.4 cm x 1.7 cm with maximum FDG uptake of 9.5  SUV is detected. Of note, a similar appearance can be seen involving the  pituitary for patient receiving immunotherapy. No acute intracranial  abnormality is detected.  HEAD/NECK:  Significant FDG uptake associated with malignancy and pertinent CT  findings.  Hypermetabolic lymphadenopathy is appreciated throughout the left cervical  lymph nodes. For example, the largest at level 4 measures 1.1 cm x 1.4 cm  on series 6 image 41 with a maximum 3.24 SUV. There is redemonstration of  asymmetric enlargement of the left thyroid lobe, stable relative to  comparison study. No hypermetabolic activity is detected.   Additionally, lymphadenopathy is detected throughout the bilateral  submandibular and right cervical lymph nodes. However, no significant  hypermetabolic activity is detected. The right and left submandibular lymph  nodes measure 2.3 cm x 2.0 cm and 2.1 cm x 2.2 cm respectively as seen on  series 6 image 31. The largest measures 1.1 cm x 1.0 cm on series 6 image  40 at the level for position with a maximum FDG uptake of 2.40 SUV.  CHEST:  No significant FDG uptake but pertinent CT findings.  There is redemonstration of pulmonary nodules located in the posterior  segment of the right upper lobe and left upper lobe. The previously  described right horizontal fissure nodule is not well demonstrated. These  nodules are stable in size relative to CT chest abdomen pelvis 07/28/2018. No  hypermetabolic activity is detected.  ABDOMEN/PELVIS:  Significant FDG uptake associated with malignancy and pertinent  CT  findings.  There is redemonstration of innumerable ill-defined hypoattenuating lesions  throughout the right and left hepatic lobes with significant hypermetabolic  activity consistent with known history of metastatic disease. These  lesions  demonstrate FDG uptake ranging from 7.78 SUV to 9.37 SUV as seen on series  8 image 89, series 8 image 97, series 8 image 86. Additionally,  hypermetabolic activity is detected along the periaortic lymph nodes. The  largest on series 8 image 124 measuring 1.6 cm x 2.5 cm with maximum 12.07  SUV. Bilateral inguinal lymphadenopathy is redemonstrated.  SKELETON (PELVIS, SPINE, SHOULDERS INCLUDING EXTREMITIES:  Significant FDG uptake associated with malignancy and pertinent CT  findings.  There is a lytic lesion measuring 0.7 cm x 1.1 cm with maximum FDG 3.68 SUV  seen on series 8 image 38 involving the T2 vertebral body. A hypermetabolic  focus is noted along the right pedicle of L1 with maximum 5.92 SUV.  Additionally, a hypermetabolic sclerotic lesion within the sacrum at S3-S4  with maximum 6.0 SUV is appreciated. Lytic lesions are appreciated  throughout the iliac wings without significant hypermetabolic activity.  IMPRESSION:  1.  Extensive metastatic disease.  2.  Hypermetabolic activity involving the liver and skeletal system with  diffuse lymphadenopathy. Consistent with patient's history of metastatic  small cell lung cancer.  3.  Hypermetabolic suprasellar lesion likely representing brain metastasis.  However, similar appearance can be seen in patients receiving  immunotherapy.    ASSESSMENT: 53 y.o. male with PMH of HTN, diabetes, and HIV positive referred for extensive stage small cell lung cancer in Feb 2020.     PLAN:  - Extensive stage small cell lung cancer.  - We reviewed with the patient prior tissue pathology and imaging studies. We discussed with the patient in length the extent of the disease, prognosis, and the available options for treatment.   - Treatment goals are palliative given metastatic nature of disease.   - Tissue slides have been reviewed at our facility and confirm diagnosis.   - Imaging from outside facility in Feb 2020 showed extensive disease in the chest with metastatic  disease to the liver and bones.  - MRI MRCP on Jun 14, 2018 showed extensive metastatic disease throughout the entirety of the liver.   - Patient received palliative radiation to the chest at outside facility which ended in mid-Feb 2020.  - Patient has received two cycles of chemotherapy to date.  - Restaging PET CT earlier today showed increased disease per Dr. Carolynn Serve after review of outside imaging. Hypermetabolic suprasellar lesion.   - Given lack of response to first-line chemotherapy, prognosis is very poor.   - Further therapy will likely only worsen patient's condition.   - This was discussed with the patient in-person and via telephone with his sister, per patient's request.  - Patient is agreeable to hospice at this time. Consulted MSW. Also discussed with palliative care physician.   - Patient is being planned for WBRT for brain metastasis. This can likely be completed while on hospice based on decision of hospice agency.   - RTC PRN.     - Diarrhea.  - Does not report today.    - Elevated LFTs/bilirubin.  - Related to metastatic disease to the liver.  - Bilirubin worsened to 18.2 today.     - Anemia.  - Hgb 10.4 today. Slight decrease since last visit.  - Normocytic.   - Likely related to chemotherapy/malignancy.     - HIV-positive.  - Evaluated  by ID while in-patient.    - Diabetes.  - HgbA1c 7.7 on Jun 12, 2018.    Dorise Hiss, MSN, APRN, FNP-C    I personally saw and examined the patient.  I agree with the findings and plan of care as documented in the note.  See mid-level's note for additional details.   Discussed overall poor prognosis and recommended hospice to patient and family and they agreed.     Merrie Roof, MD  Associate Professor  Section of Hematology Oncology  [facility] Department of Medicine

## 2018-08-06 NOTE — Nurses Notes (Signed)
56- Pt arrived to [facility] prior to clinic and infusion appt. Port accessed with a 20g 3/4 inch huber needle using sterile technique. Labs obtained and sent for processing. Pt escorted to exam room via wheelchair by LPN. Levora Angel, RN

## 2018-08-06 NOTE — Cancer Center Note (Signed)
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 He is being evaluated for brain radiation. Recently admitted for leg edema. I met him at that time and he was considering hospice as an option. At that time, I increased his pain medications from 42m of Oxycodone to 7.581mq4h PRN. He returns today for follow up.    Subjective:     Patient seen in clinic, presented alone. He mentioned pain to be in the abdomen, legs and right eye. He has associated ptosis of the right eye. Oxycodone at 7.32m98melps, but only temporarily. He has no associated drowsiness. He has just seen oncology, and decided upon pursuing hospice.    We discussed home hospice as an option. He lives with his parents and some cousins, who will help take care of him. Currently he can still get around the house with some effort. He was hoping to be able to still make appointments and return to the hospital in the future. I explained to him that usually at this point, anything done in the hospital is unlikely to add to his comfort/care, unless admitted for pain/symptom control. He understood, and also understood the need for medication reconiliation.    ROS  Constitutional: negative for fevers and chills  Eyes: positive for icterus  Ears, nose, mouth, throat, and face: negative for tinnitus and ear drainage  Respiratory: negative for cough or sputum  Cardiovascular: negative for chest pain and dyspnea  Gastrointestinal: positive for abdominal pain and jaundice  Hematologic/lymphatic: negative for easy bruising and bleeding  Musculoskeletal:negative for myalgias and arthralgias  Neurological: negative for seizures and tremor  Endocrine: positive for obesity    All other ROS Negative    Social History:    Living with his parents currently./    Current Outpatient Medications   Medication Sig   . aspirin (ECOTRIN) 81 mg Oral Tablet, Delayed Release (E.C.) Take 81 mg by mouth Once a day   . benzonatate (TESSALON) 100 mg Oral Capsule Take 100 mg by mouth Every 4 hours as needed for Cough    . buPROPion (WELLBUTRIN XL) 300 mg extended release 24 hr tablet Take 300 mg by mouth Once a day   . dexAMETHasone (DECADRON) 4 mg Oral Tablet Take 1 Tab (4 mg total) by mouth Twice daily for 30 days Take both doses before 3PM   . dolutegravir (TIVICAY) tablet Take 50 mg by mouth Once a day   . empagliflozin (JARDIANCE) 25 mg Oral Tablet Take 1 Tab (25 mg total) by mouth Once a day   . emtricitabine-tenofovir alafen (DESCOVY) 200-25 mg Oral Tablet Take 1 Tab by mouth Once a day   . furosemide (LASIX) 20 mg Oral Tablet Take 1 Tab (20 mg total) by mouth Once per day as needed   . levothyroxine (SYNTHROID) 25 mcg Oral Tablet Take 1 Tab (25 mcg total) by mouth Every morning for 30 days   . metoprolol tartrate (LOPRESSOR) 25 mg Oral Tablet Take 1 Tab (25 mg total) by mouth Twice daily   . ondansetron (ZOFRAN) 4 mg Oral Tablet Take 4 mg by mouth Every 8 hours as needed for Nausea/Vomiting   . oxyCODONE (ROXICODONE) 15 mg Oral Tablet Take 1 Tab (15 mg total) by mouth Every 2  hours as needed for Pain for up to 14 days   . polyethylene glycol 3350 (MIRALAX ORAL) Take 1 Packet by mouth Once per day as needed    . promethazine (PHENERGAN) 25 mg Oral Tablet Take 25 mg by mouth Every 8 hours as needed for Nausea/Vomiting   . sertraline (ZOLOFT) 100 mg Oral Tablet Take 100 mg by mouth Once a day   . sodium chloride 1 gram Oral Tablet Take 1 g by mouth Three times daily with meals 2 tabs   . spironolactone (ALDACTONE) 25 mg Oral Tablet Take 1 Tab (25 mg total) by mouth Twice daily with food for 30 days   . triamcinolone acetonide (ARISTOCORT A) 0.1 % Ointment by Apply Topically route Twice per day as  needed (Patient not taking: Reported on 07/28/2018)       Objective:  Most Recent Vitals      Office Visit from 08/06/2018 in [place]   Temperature  36.8 C (98.2 F) filed at... 08/06/2018 1100   Heart Rate  (!) 105 filed at... 08/06/2018 1100   Respiratory Rate  19 filed at... 08/06/2018 1100   BP (Non-Invasive)  123/64 filed at... 08/06/2018 1100   SpO2  --   Height  1.753 m (5' 9") filed at... 08/06/2018 1100   Weight  128 kg (282 lb 3 oz) filed at... 08/06/2018 1100   BMI (Calculated)  41.76 filed at... 08/06/2018 1100   BSA (Calculated)  2.5 filed at... 08/06/2018 1100      General:moderately obese andjaundiced  Eyes:slceral icterus., Pupils equal and round.  HENT:ENMT without erythema or injection, mucous membranes moist.  Neck:no thyromegaly or lymphadenopathy  Lungs:Clear to auscultation bilaterally.  Cardiovascular:regular rate and rhythm  Abdomen:tender RUQ and epigastrium  Extremities:No cyanosis or significant edema  Skin:Skin warm and dry  Neurologic:No tremor, No asterixis  Lymphatics:No lymphadenopathy  Psychiatric:Normal affect, behavior, memory, thought content, judgement, and speech. Teary    Labs:      Cr 0.77  eGFR >60    AST 190  ALT 152  ALP 944  T Bili 18.2    Imaging Studies:    Thomas Barry  Male, 75 years old.    PET NH:RVAC (HEAD TO THIGH) W IV CONTRAST performed on 08/03/2018 12:19 PM.    REASON FOR EXAM:  C34.90: Small cell lung cancer (CMS HCC)    TECHNIQUE AND FINDINGS:    INTRAVENOUS CONTRAST: 100 ml's of Isovue.  RADIOACTIVE TRACER DOSE: 11.1 mCi 18FDG IV TG @ 1150.  ORAL CONTRAST: 800cc jello water with 10cc gastrografin.  Glucose level at time of exam: 150.  Creatinine / GFR level? 0.66 mg/dL.  CT attenuation acquisition was obtained with modulation Diagnostic CT  technique.  Images were reconstructed registered and fused in the axial  coronal and sagittal planes.  The study is compared to that of CT chest  abdomen pelvis  07/28/2018.    BRAIN:   Significant FDG uptake associated with malignancy and pertinent CT  findings.    A suprasellar mass measuring 1.4 cm x 1.7 cm with maximum FDG uptake of 9.5  SUV is detected. Of note, a similar appearance can be seen involving the  pituitary for patient receiving immunotherapy. No acute intracranial  abnormality is detected.    HEAD/NECK:  Significant FDG uptake associated with malignancy and pertinent CT  findings.    Hypermetabolic lymphadenopathy is appreciated throughout the left cervical  lymph nodes. For example, the largest at level 4 measures 1.1 cm  x 1.4 cm  on series 6 image 41 with a maximum 3.24 SUV. There is redemonstration of  asymmetric enlargement of the left thyroid lobe, stable relative to  comparison study. No hypermetabolic activity is detected.     Additionally, lymphadenopathy is detected throughout the bilateral  submandibular and right cervical lymph nodes. However, no significant  hypermetabolic activity is detected. The right and left submandibular lymph  nodes measure 2.3 cm x 2.0 cm and 2.1 cm x 2.2 cm respectively as seen on  series 6 image 31. The largest measures 1.1 cm x 1.0 cm on series 6 image  40 at the level for position with a maximum FDG uptake of 2.40 SUV.    CHEST:  No significant FDG uptake but pertinent CT findings.    There is redemonstration of pulmonary nodules located in the posterior  segment of the right upper lobe and left upper lobe. The previously  described right horizontal fissure nodule is not well demonstrated. These  nodules are stable in size relative to CT chest abdomen pelvis 07/28/2018. No  hypermetabolic activity is detected.    ABDOMEN/PELVIS:  Significant FDG uptake associated with malignancy and pertinent CT  findings.    There is redemonstration of innumerable ill-defined hypoattenuating lesions  throughout the right and left hepatic lobes with significant hypermetabolic  activity consistent with known history of metastatic  disease. These lesions  demonstrate FDG uptake ranging from 7.78 SUV to 9.37 SUV as seen on series  8 image 89, series 8 image 97, series 8 image 86. Additionally,  hypermetabolic activity is detected along the periaortic lymph nodes. The  largest on series 8 image 124 measuring 1.6 cm x 2.5 cm with maximum 12.07  SUV. Bilateral inguinal lymphadenopathy is redemonstrated.    SKELETON (PELVIS, SPINE, SHOULDERS INCLUDING EXTREMITIES:  Significant FDG uptake associated with malignancy and pertinent CT  findings.    There is a lytic lesion measuring 0.7 cm x 1.1 cm with maximum FDG 3.68 SUV  seen on series 8 image 38 involving the T2 vertebral body. A hypermetabolic  focus is noted along the right pedicle of L1 with maximum 5.92 SUV.  Additionally, a hypermetabolic sclerotic lesion within the sacrum at S3-S4  with maximum 6.0 SUV is appreciated. Lytic lesions are appreciated  throughout the iliac wings without significant hypermetabolic activity.    IMPRESSION:     1.  Extensive metastatic disease.  2.  Hypermetabolic activity involving the liver and skeletal system with  diffuse lymphadenopathy. Consistent with patient's history of metastatic  small cell lung cancer.  3.  Hypermetabolic suprasellar lesion likely representing brain metastasis.  However, similar appearance can be seen in patients receiving  immunotherapy.    Assessment/Recommendations:     Encounter for palliative care for adult failure to thrive secondary to [place] involving the liver, mediastinum and brain. His pain is likely nociceptive somatic due to capsular distention of the liver. He likely had a degree of nociceptive visceral pain as well. GOC now for comfort, referred to home hospice.     Plan:  #Pain:  - Oxycodone 7.23m q2h PRN - my hope is that hospice will review usage and adjust accordingly  - dexamethsone 42mBID for at least 7 days - patient knows hospice will have to review continued need after 7-10 days  - agree with palliative  radiation to brain for pain, and ptosis    #Advanced Care planning:  - DNR/DNI based on last hospital visit. Further limitations can be addressed by hospice    #  Suggested medication reconciliation:  - d/c aspirin, bupropion, empagliflozin, sodium chloride, aldactone, descovy, tivicay  - DM can be managed with insulin    Pennie Banter, MD      Time: 30 minutes / >50% of the total time above spent in counseling, providing support and care coordination as detailed above. 74mn were spent discussing advance care planning.

## 2018-08-07 ENCOUNTER — Encounter (HOSPITAL_BASED_OUTPATIENT_CLINIC_OR_DEPARTMENT_OTHER): Payer: Self-pay

## 2018-08-07 ENCOUNTER — Encounter (HOSPITAL_BASED_OUTPATIENT_CLINIC_OR_DEPARTMENT_OTHER): Payer: Self-pay | Admitting: Geriatric Medicine

## 2018-08-07 NOTE — Progress Notes (Signed)
MSW was contacted by [facility] regarding pt's referral. Pt reached out to [facility] as preference; MSW faxed Hospice Order and pt information to [facility] today 08/07/2018. Hospice will reach out to pt to set up an admission visit. MSW will continue to follow as needed.       [facility]:  P: [phone]  F: Wright, MSW

## 2018-08-07 NOTE — Progress Notes (Signed)
On 08/06/2018 MSW was consulted by Nurse Practitioner to meet with pt for the purpose of arranging Hospice care. MSW met with pt in exam room 24 and discussed hospice care. Pt states he will contact MSW via phone with agency preference once he has made a decision. MSW provided MSW contact information and is waiting for pt's call. MSW will continue to follow.       Laqueta Due, MSW

## 2018-08-07 NOTE — Nursing Note (Signed)
Contacted [facility] at [phone] who states that patient's insurance will require a prior authorization prior to filling patient's medication that was written yesterday by Dr. Rica Mote.    As per social worker's note this am patient has chosen hospice.  Contacted [facility] at [phone] and spoke with the nurse that states they are currently working on verifying information and insurance; a nurse will be out to see patient this afternoon to get everything set up.  Relayed to RN that oxycodone was increased yesterday from 7.5mg  Q4 hours to Q2 hours as needed.      Faxed 109 pages to [facility] per their request.  Fax contains hospice order, oncologist note, supportive care note and information through care everywhere.  Alethia Berthold, RN.

## 2018-08-10 ENCOUNTER — Ambulatory Visit (HOSPITAL_BASED_OUTPATIENT_CLINIC_OR_DEPARTMENT_OTHER): Payer: Self-pay | Admitting: Geriatric Medicine

## 2018-08-10 ENCOUNTER — Inpatient Hospital Stay (HOSPITAL_BASED_OUTPATIENT_CLINIC_OR_DEPARTMENT_OTHER)
Admission: RE | Admit: 2018-08-10 | Discharge: 2018-08-10 | Disposition: A | Discharge status: Still In Hospital | Payer: Commercial Managed Care - PPO | Source: Ambulatory Visit | Attending: Radiation Oncology | Admitting: Radiation Oncology

## 2018-08-10 ENCOUNTER — Other Ambulatory Visit: Payer: Self-pay

## 2018-08-10 DIAGNOSIS — C7931 Secondary malignant neoplasm of brain: Secondary | ICD-10-CM

## 2018-08-10 NOTE — Nursing Note (Signed)
Returned call to Bea at [facility] and she was questioning the radiation.  Related that through the notes the patient is getting WBR in 5 factions and those treatments will start on 08/10/2018 through 08/14/2018.  Notes relate that radiation is palliative in nature to assist with pain and ptosis.  Relayed this information to Bea.  Call was disconnected twice, Bea will return call if she needs more information.  Alethia Berthold, RN.

## 2018-08-10 NOTE — Telephone Encounter (Signed)
-----   Message from Rojelio Brenner sent at 08/10/2018  2:08 PM EDT -----  [place] pt    Pt's home health nurse, Jari Pigg has some questions regarding the pt. Please call back to discuss    Thank you

## 2018-08-11 ENCOUNTER — Inpatient Hospital Stay (HOSPITAL_BASED_OUTPATIENT_CLINIC_OR_DEPARTMENT_OTHER)
Admission: RE | Admit: 2018-08-11 | Discharge: 2018-08-11 | Disposition: A | Discharge status: Still In Hospital | Payer: Commercial Managed Care - PPO | Source: Ambulatory Visit | Attending: Radiation Oncology | Admitting: Radiation Oncology

## 2018-08-11 DIAGNOSIS — C7931 Secondary malignant neoplasm of brain: Secondary | ICD-10-CM

## 2018-08-12 ENCOUNTER — Inpatient Hospital Stay (HOSPITAL_BASED_OUTPATIENT_CLINIC_OR_DEPARTMENT_OTHER)
Admission: RE | Admit: 2018-08-12 | Discharge: 2018-08-12 | Disposition: A | Discharge status: Still In Hospital | Payer: Commercial Managed Care - PPO | Source: Ambulatory Visit | Attending: Radiation Oncology | Admitting: Radiation Oncology

## 2018-08-12 ENCOUNTER — Other Ambulatory Visit: Payer: Self-pay

## 2018-08-12 DIAGNOSIS — C7931 Secondary malignant neoplasm of brain: Secondary | ICD-10-CM

## 2018-08-13 ENCOUNTER — Inpatient Hospital Stay (HOSPITAL_BASED_OUTPATIENT_CLINIC_OR_DEPARTMENT_OTHER)
Admission: RE | Admit: 2018-08-13 | Discharge: 2018-08-13 | Disposition: A | Discharge status: Still In Hospital | Payer: Commercial Managed Care - PPO | Source: Ambulatory Visit | Attending: Radiation Oncology | Admitting: Radiation Oncology

## 2018-08-13 DIAGNOSIS — C7931 Secondary malignant neoplasm of brain: Secondary | ICD-10-CM

## 2018-08-13 NOTE — Progress Notes (Signed)
RADIATION ONCOLOGY ON-TREATMENT NOTE    Patient Name:Thomas  Barry  OIP:[id number]  Date of Birth:[birth year]    DATE OF SERVICE:  08/12/2018    DIAGNOSIS:  C1 WHOLE BRAIN: C79.31 Secondary malignant neoplasm of brain    CURRENT TREATMENT PLAN:  Course: C1 WHOLE BRAIN  Treatment Site: WHOLE BRAIN_F  Energy: 6X  Dose/Fx (Gy): 4  #Fx: 2 / 5  Dose Correction (Gy): 0  Total Dose (Gy): 8  Start Date: 08/10/2018  End Date: 08/11/2018  Elapsed Days: 1    HISTORY: 53yo male with extensive stage small cell lung cancer with intracranial  and liver metastases. He has a suprasellar brain mass which has increased in  size with associated headaches. He is currently undergoing palliative whole  brain radiation treatment.    08/12/18: He is tolerating treatment well and is on a 14 day decadron taper. He  reports that his headaches are controlled with the MS Contin and Roxicodone  regimen per Supportive Care. His right sided ptosis has resolved. He denies any  blurry vision, weakness/numbness. He has mild fatigue.    PHYSICAL EXAM:  Gen: Alert. No acute distress  Neuro: AOx3. Full strength. Visual fields intact.  Abdomen: Distended.    Recent Vitals: Per Epic    LABS/SCANS (reviewed as per Epic):  N/A    CTCAE 4.0 TOXICITY GRADE: 0    All radiation therapy related imaging (portal images, IGRT) reviewed offline.    TOLERANCE:      Patient Reported Subjective Response (if applicable):   Physician Reported Objective Response (if applicable):   Pain Management Plan for Unresolved Pain:    N/A  Increase dose of Medication    Change Pain Medication    Reinforce proper use of Pain Medication    Refer to Pain Clinic    XOther:       Per Supportive Care    ASSESSMENT:     Treatment Status:   Stable.   Plan:           Continue radiation treatment as prescribed.          Electronic Authorization: Rosalie Gums, MD, MPH, MBA, FACR4/23/2020  2:00:03 PM  [place]

## 2018-08-14 ENCOUNTER — Other Ambulatory Visit: Payer: Self-pay

## 2018-08-14 ENCOUNTER — Inpatient Hospital Stay
Admission: RE | Admit: 2018-08-14 | Discharge: 2018-08-14 | Disposition: A | Discharge status: Still In Hospital | Payer: Commercial Managed Care - PPO | Source: Ambulatory Visit | Attending: Internal Medicine | Admitting: Internal Medicine

## 2018-08-15 ENCOUNTER — Other Ambulatory Visit (HOSPITAL_BASED_OUTPATIENT_CLINIC_OR_DEPARTMENT_OTHER): Payer: Self-pay | Admitting: General Practice

## 2018-08-17 DIAGNOSIS — C7931 Secondary malignant neoplasm of brain: Secondary | ICD-10-CM

## 2018-08-17 NOTE — Progress Notes (Signed)
[facility]  Radiation Treatment Summary    Patient Name:   Thomas Barry  MR #:   [id number]  Date of Birth:   1965/09/16  Attending:   Rosalie Gums MD    ALL DIAGNOSIS :  Primary C79.31 - Secondary malignant neoplasm of brain,  Diagnosed 08/04/2018 (Active)  ,  DIAGNOSIS (This Course): C79.31 Secondary malignant neoplasm of brain  EBRT Treatment Summary:  Course: C1 WHOLE BRAIN  Treatment Site: WHOLE BRAIN_F  Energy: 6X  Dose/Fx (Gy): 4  #Fx: 5 / 5  Dose Correction (Gy): 0  Total Dose (Gy): 20  Start Date: 08/10/2018  End Date: 08/14/2018  Elapsed Days: 4    EBRT Treatment Technique:  C1 WHOLE BRAIN: lateral fields  C1 WHOLE BRAIN: lateral fields    CONCURRENT SYSTEMIC THERAPY:  no    HISTORY:  Thomas Barry is a 53 y.o. male from [place] who recently relocated to [place]  following his diagnosis of Small Cell Lung Cancer. He has a history of type 2  diabetes, HIV, Hep B, HTN, CAD s/p CABG. He was diagnosed with SCLC in February  2020 after presenting with worsening headaches and dyspnea. On diagnosis he was  found to have extensive stage disease involving his chest, bones, liver, and  brain. He underwent palliative radiation to the chest in 05/2018 and reports  that he had a total of 15 fractions. He presented to [facility] to start palliative  chemotherapy in February 2020 and underwent carbo/etoposide treatment. Since  that time his liver disease has progressed and he has developed asicitis with  increased abdominal pain. A repeat CT brain on 07/28/18 demonstrates an increase  in size of the known suprasellar brain lesion now measuring 1.8cm which is  increased from 70mm on prior imaging in February. A CT CAP on 07/28/18 shows  diffuse metastatic disease involving the liver along with multiple pulmonary  nodules. The patient reports that he has fatigue and occasional headaches but  denies any blurry vision, weakness/numbness, N/V, seizures, chest pain, fevers.  He reports that overall he would like quality versus quantity in  terms of  treatment going forward. He has been considering hospice care.  He was referred for consideration of palliative brain radiation.    TOLERANCE:        good     Patient Reported Subjective Response (if applicable):   Physician Reported Objective Response (if applicable):   Pain Management Plan for Unresolved Pain:    xN/A    Increase dose of Medication    Change Pain Medication    Reinforce proper use of Pain Medication    Refer to Pain Clinic    Other:    FOLLOWUP:  He will conctinue followup with his referring physician.    Electronic Authorization: Rosalie Gums, MD, MPH, MBA, [facility]   08/17/2018  4:16:33 PM

## 2018-08-19 ENCOUNTER — Encounter (HOSPITAL_BASED_OUTPATIENT_CLINIC_OR_DEPARTMENT_OTHER): Payer: Self-pay | Admitting: General Practice

## 2018-08-21 DEATH — deceased

## 2018-10-11 IMAGING — CR DG CHEST 1V PORT
1 series · 1 of 1 positions shown · non-contrast
Comparison: 09/09/2016

CLINICAL DATA: Status post CABG with chest tube

EXAM:
PORTABLE CHEST 1 VIEW

[AP]
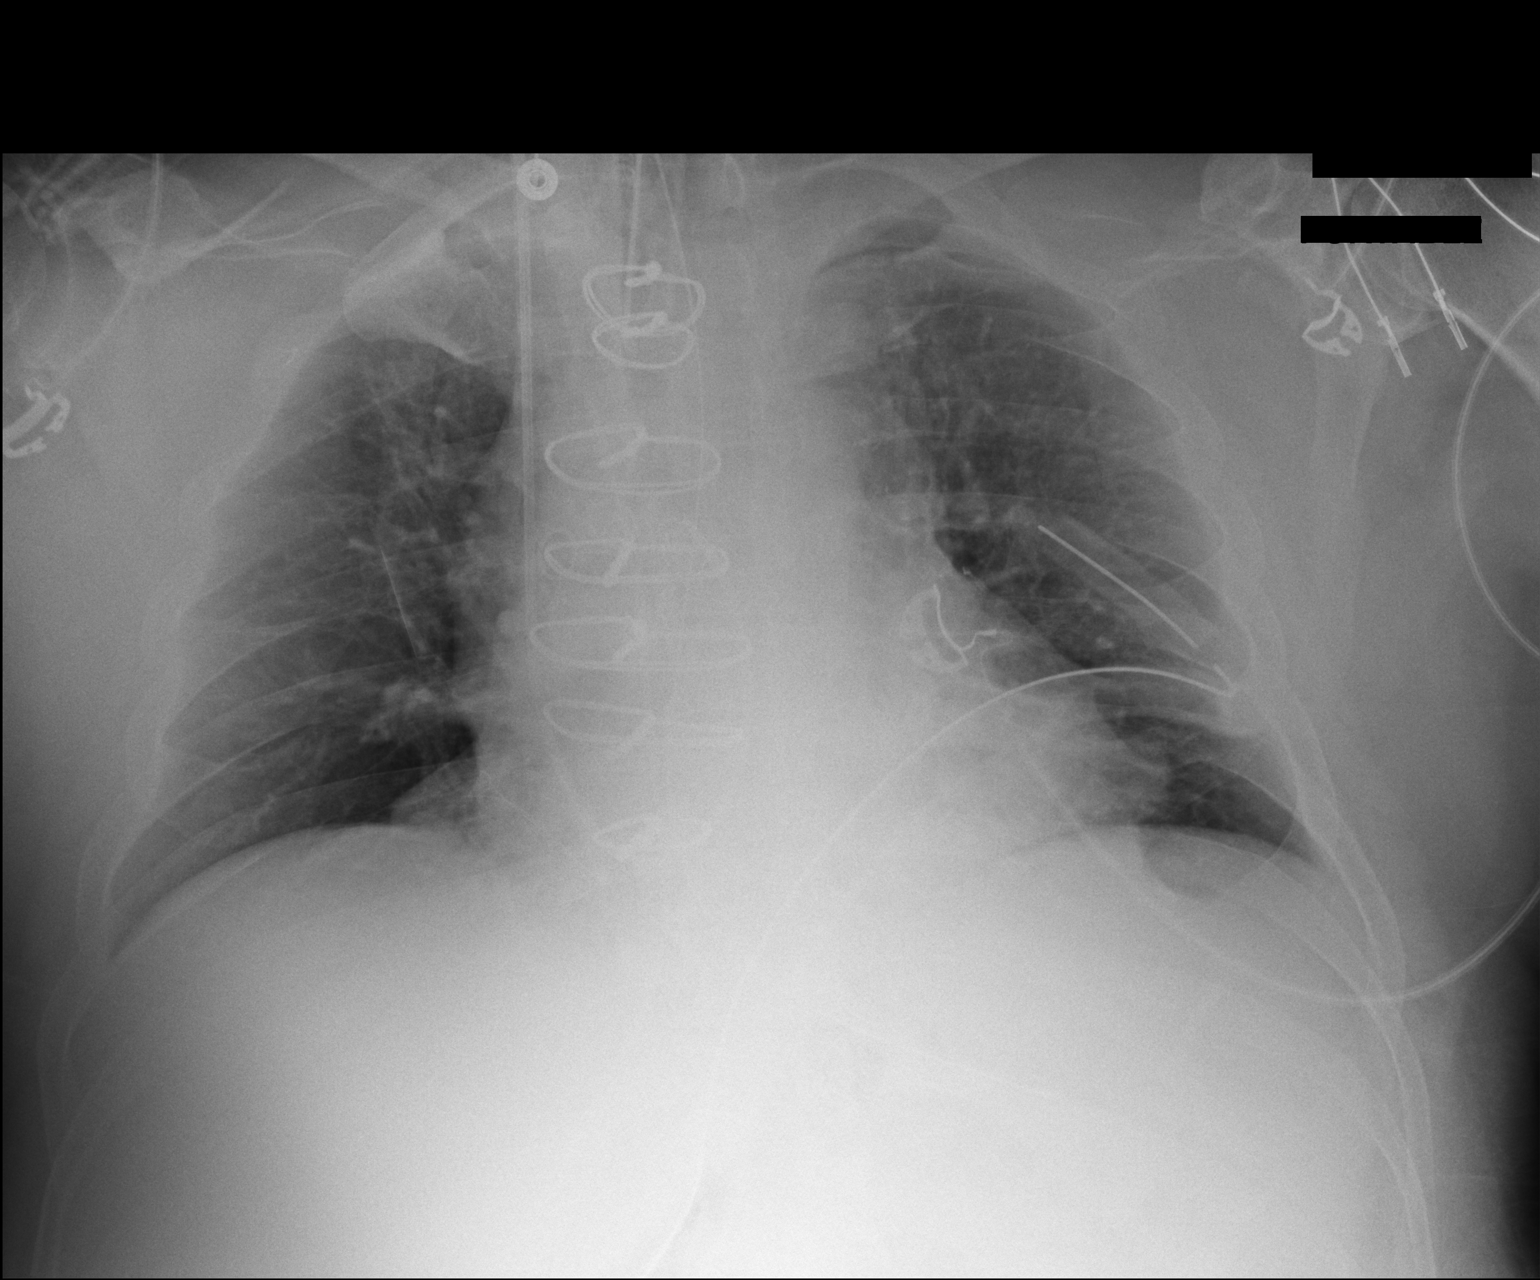

[1 of 1 positions shown; findings below may reference images not displayed]

FINDINGS: Post sternotomy changes. Endotracheal tube tip approximately 3.7 cm
superior to the carina. Esophageal tube tip overlies the proximal
stomach. Right IJ Swan-Ganz catheter tip overlies pulmonary
confluence.

Left-sided chest tube slightly retracted, tip overlies the mid
thorax and the side-port overlies the left lateral chest. Clearing
of previously noted right upper lobe atelectasis. Minimal left
basilar atelectasis. Mild hyper lucency at the right cardiophrenic
angle but no definitive pleural line and there are lung markings
visualize in this region.
IMPRESSION: 1. Support lines and tubes as above. Left-sided chest tube slightly
retracted as above.
2. Clearing of right upper lobe atelectasis with improved aeration
of both bases. Patchy atelectasis at both lung bases.

## 2018-10-13 IMAGING — CR DG CHEST 1V PORT
1 series · 1 of 1 positions shown · non-contrast
Comparison: Portable exam 6035 hours compared to 09/10/2016

CLINICAL DATA: Chest tube post CABG, hypertension, HIV, smoker,
prior MI

EXAM:
PORTABLE CHEST 1 VIEW

[AP]
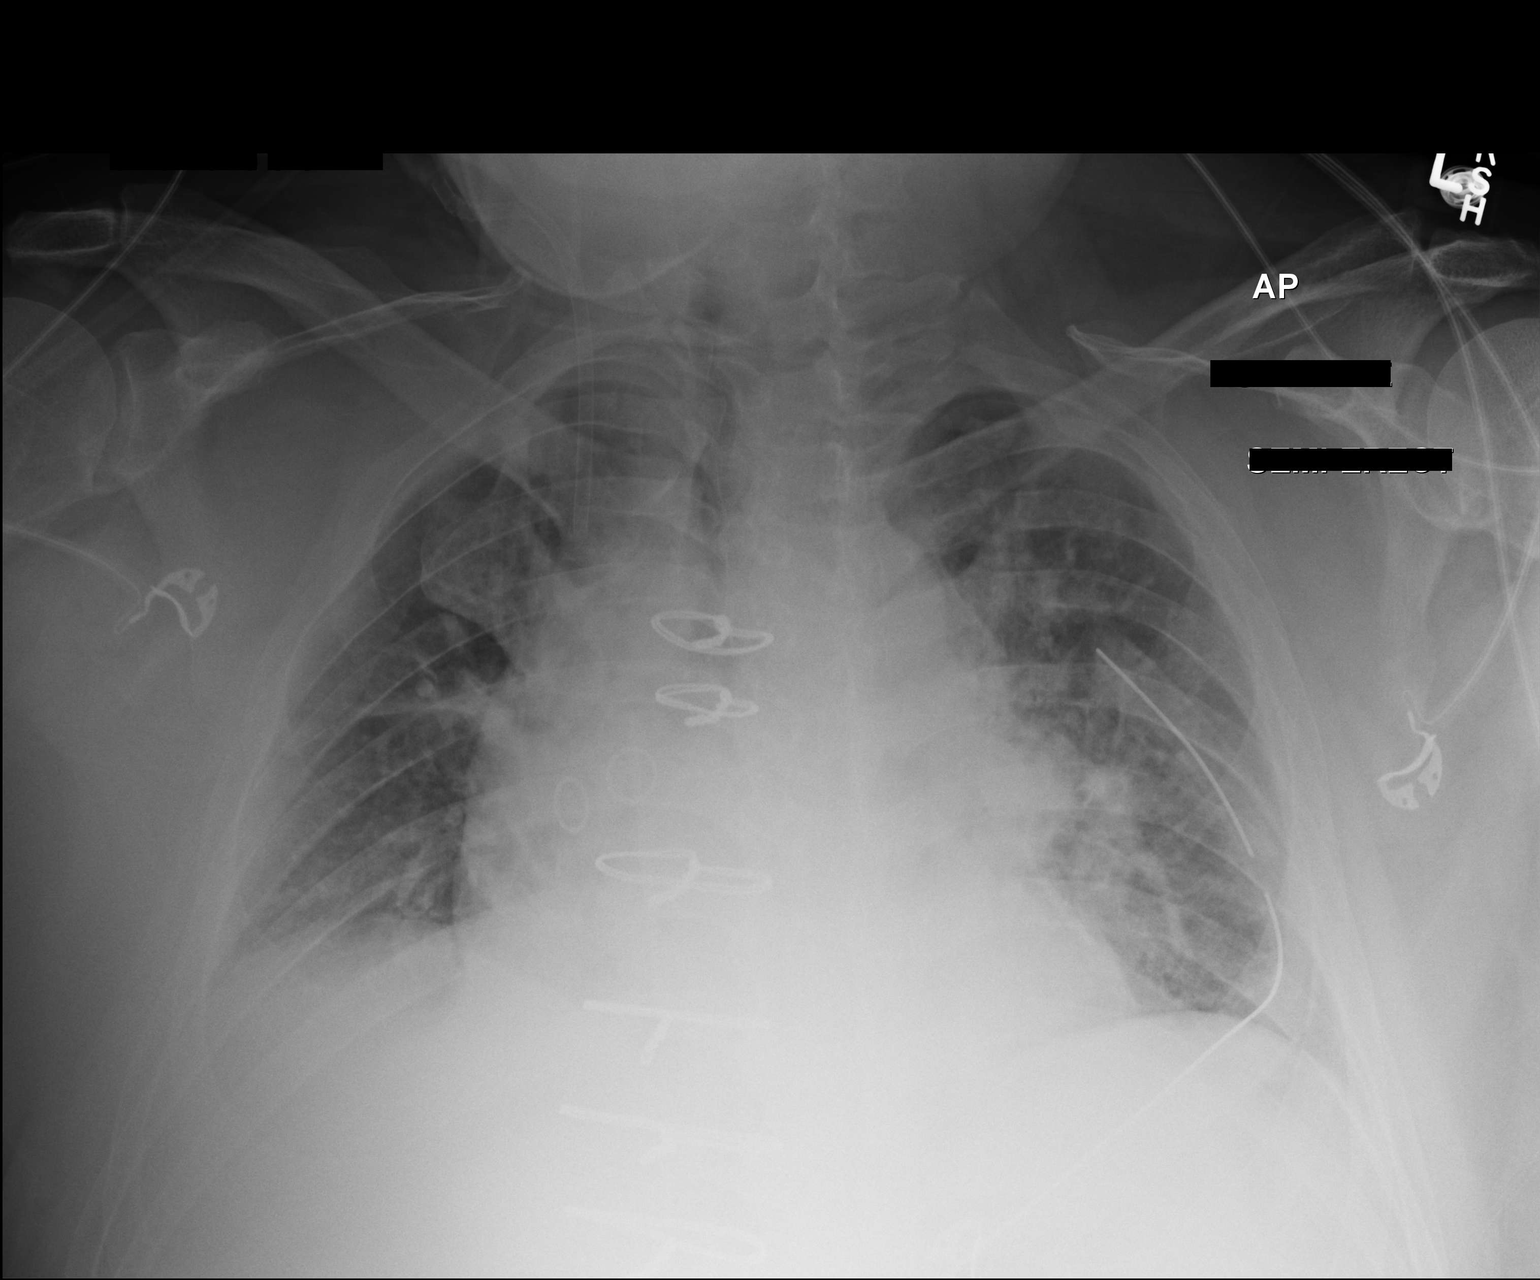

[1 of 1 positions shown; findings below may reference images not displayed]

FINDINGS: Interval removal of Swan-Ganz catheter.

RIGHT jugular line unchanged.

LEFT thoracostomy tube stable.

Enlargement of cardiac silhouette with pulmonary vascular congestion
post CABG.

Prominent mediastinum likely related to surgery and technique.

Mild scattered atelectasis.

Question minimal perihilar edema on LEFT.

Tiny LEFT apex pneumothorax.

No gross pleural effusion.
IMPRESSION: LEFT thoracostomy tube with tiny LEFT apex pneumothorax.

Scattered atelectasis and question minimal perihilar edema.

## 2018-10-16 IMAGING — CR DG CHEST 1V PORT
1 series · 1 of 1 positions shown · non-contrast
Comparison: 09/13/2016

CLINICAL DATA: Status post CABG.

EXAM:
PORTABLE CHEST 1 VIEW

[AP]
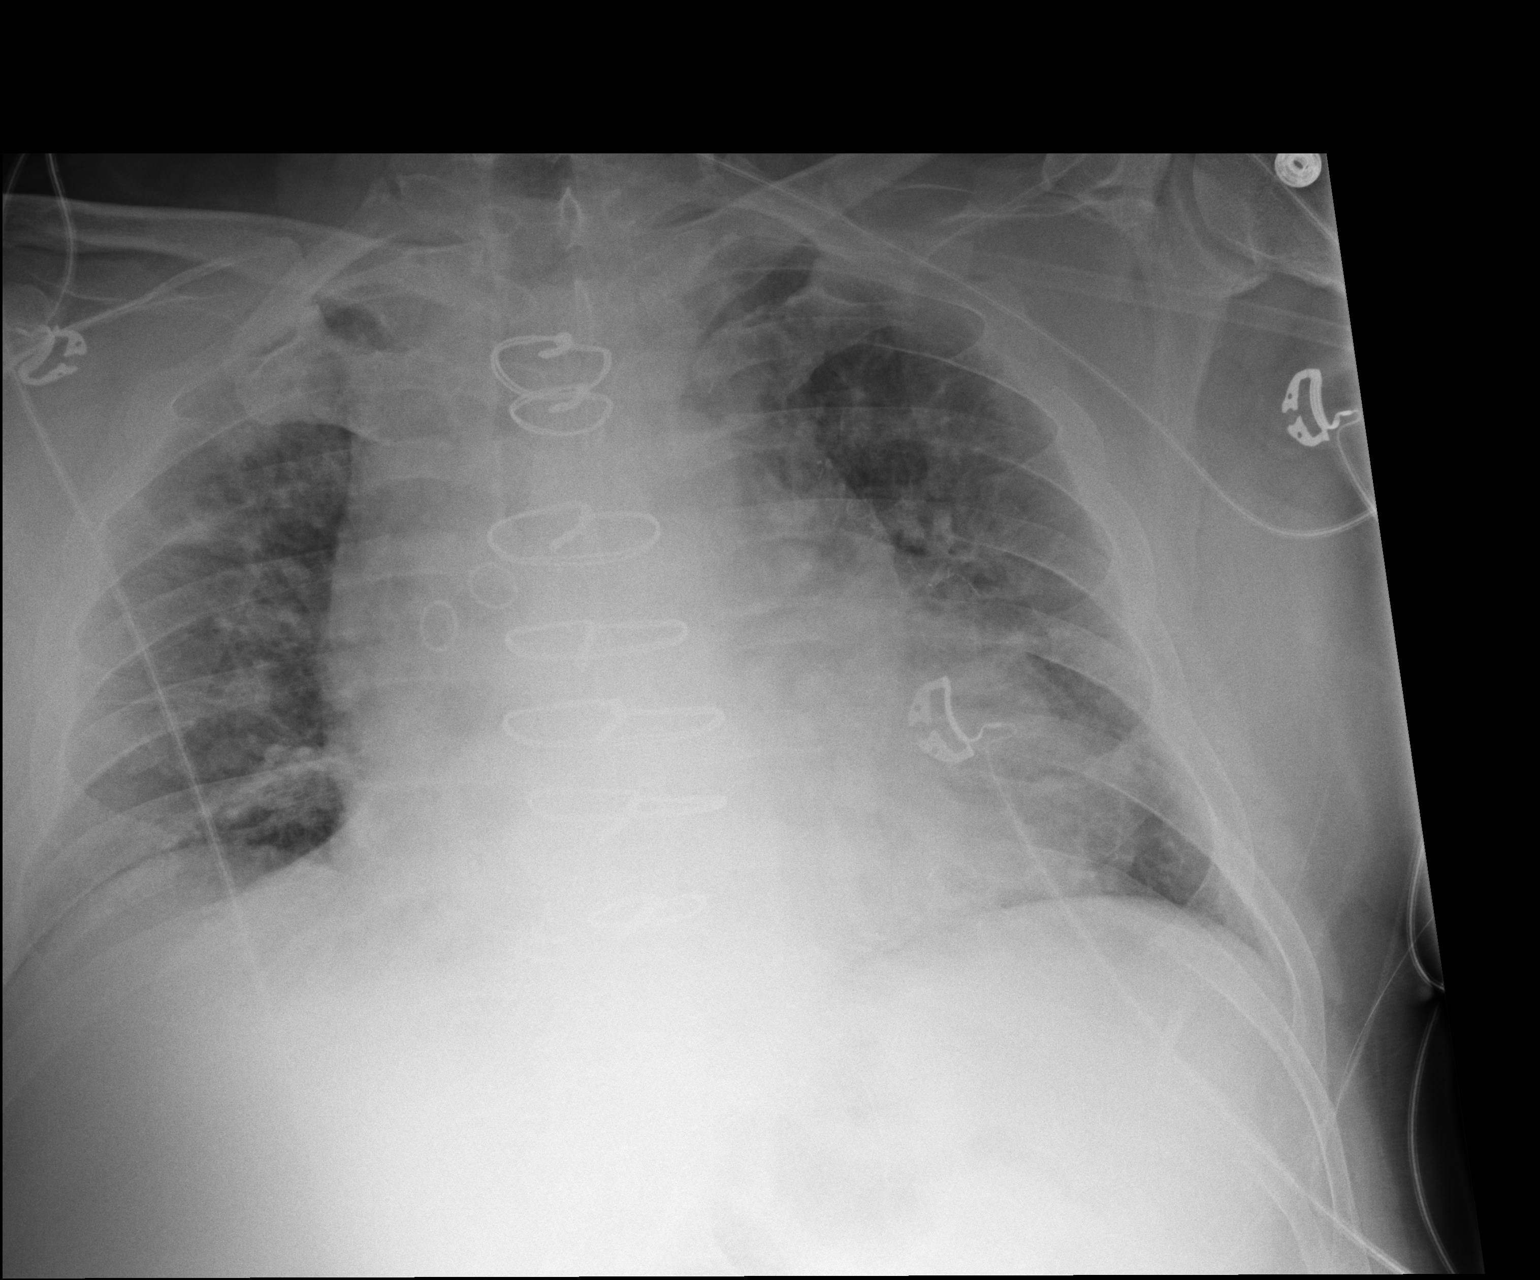

[1 of 1 positions shown; findings below may reference images not displayed]

FINDINGS: Sequelae of CABG are again identified. Cardiac silhouette remains
enlarged. Pulmonary vascular congestion is mildly increased. No
overt pulmonary edema or lobar consolidation is seen. No sizable
pleural effusion or pneumothorax is identified.
IMPRESSION: Cardiomegaly with mildly increased vascular congestion.

## 2018-10-17 IMAGING — CR DG CHEST 1V PORT
1 series · 1 of 1 positions shown · non-contrast
Comparison: 09/14/2016.

CLINICAL DATA: Status post CABG.

EXAM:
PORTABLE CHEST 1 VIEW

[AP]
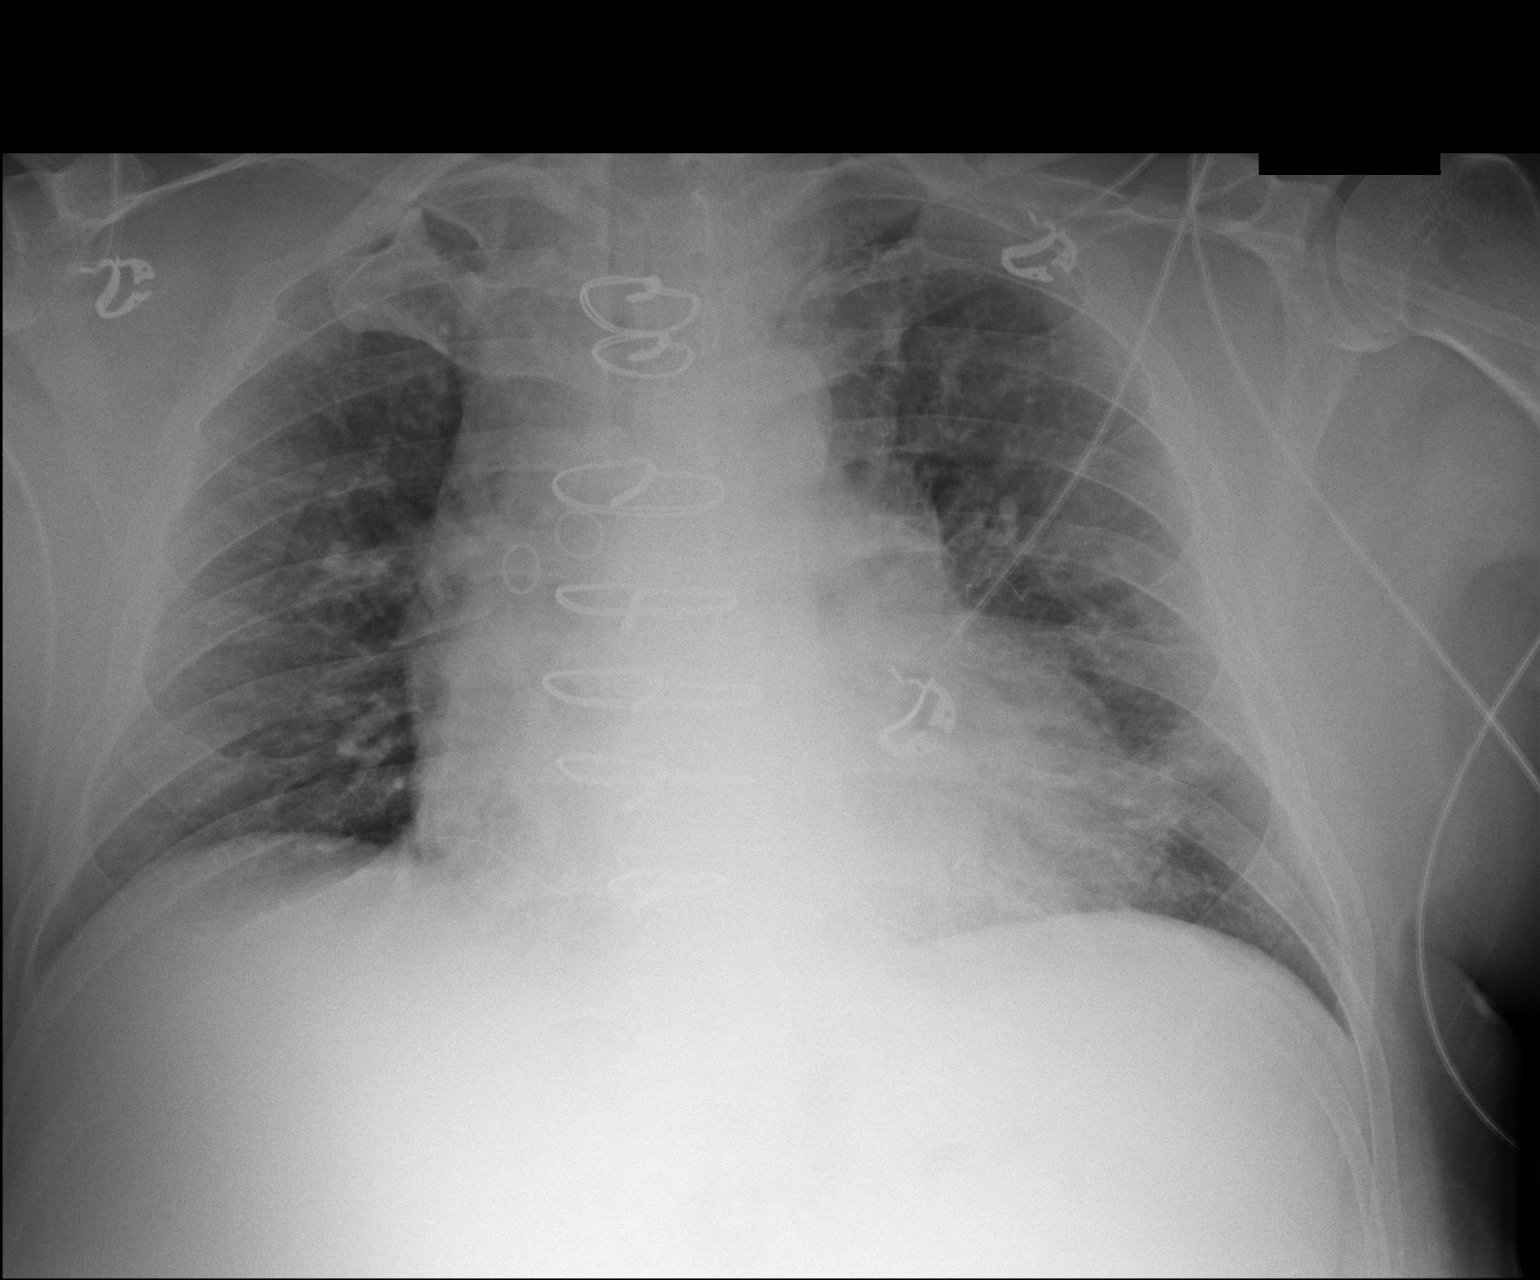

[1 of 1 positions shown; findings below may reference images not displayed]

FINDINGS: Cardiomegaly. Sequelae of prior CABG. Mild vascular congestion. No
active infiltrates or failure.
IMPRESSION: Improved aeration.  Cardiomegaly with mild vascular congestion.

## 2018-10-24 MED ADMIN — electrolyte-A intravenous solution: INTRAVENOUS | @ 09:00:00 | NDC 00338022104

## 2018-10-25 MED ADMIN — potassium chloride 20 mEq/L in 0.9 % sodium chloride intravenous: @ 01:00:00

## 2018-10-26 ENCOUNTER — Encounter (INDEPENDENT_AMBULATORY_CARE_PROVIDER_SITE_OTHER): Payer: Self-pay | Admitting: Internal Medicine

## 2018-11-18 ENCOUNTER — Ambulatory Visit (HOSPITAL_BASED_OUTPATIENT_CLINIC_OR_DEPARTMENT_OTHER): Payer: Self-pay | Admitting: INTERNAL MEDICINE-ENDOCRINOLOGY-DIABETES AND METABOLISM
